# Patient Record
Sex: Male | Born: 1950 | Race: White | Hispanic: No | Smoking: Never smoker
Health system: Southern US, Community
[De-identification: ages and names within clinical notes are randomized; demographics above are authoritative.]

## PROBLEM LIST (undated history)

## (undated) DIAGNOSIS — E1151 Type 2 diabetes mellitus with diabetic peripheral angiopathy without gangrene: Secondary | ICD-10-CM

## (undated) DIAGNOSIS — Z860100 Personal history of colon polyps, unspecified: Secondary | ICD-10-CM

## (undated) DIAGNOSIS — F3340 Major depressive disorder, recurrent, in remission, unspecified: Secondary | ICD-10-CM

## (undated) DIAGNOSIS — E1142 Type 2 diabetes mellitus with diabetic polyneuropathy: Secondary | ICD-10-CM

## (undated) DIAGNOSIS — N200 Calculus of kidney: Secondary | ICD-10-CM

## (undated) DIAGNOSIS — I739 Peripheral vascular disease, unspecified: Secondary | ICD-10-CM

## (undated) DIAGNOSIS — I82402 Acute embolism and thrombosis of unspecified deep veins of left lower extremity: Secondary | ICD-10-CM

## (undated) DIAGNOSIS — E7800 Pure hypercholesterolemia, unspecified: Secondary | ICD-10-CM

## (undated) DIAGNOSIS — H269 Unspecified cataract: Secondary | ICD-10-CM

## (undated) DIAGNOSIS — Z973 Presence of spectacles and contact lenses: Secondary | ICD-10-CM

## (undated) DIAGNOSIS — IMO0002 Reserved for concepts with insufficient information to code with codable children: Secondary | ICD-10-CM

## (undated) DIAGNOSIS — D649 Anemia, unspecified: Secondary | ICD-10-CM

## (undated) DIAGNOSIS — M1990 Unspecified osteoarthritis, unspecified site: Secondary | ICD-10-CM

## (undated) DIAGNOSIS — I10 Essential (primary) hypertension: Secondary | ICD-10-CM

## (undated) DIAGNOSIS — I509 Heart failure, unspecified: Secondary | ICD-10-CM

## (undated) DIAGNOSIS — IMO0001 Reserved for inherently not codable concepts without codable children: Secondary | ICD-10-CM

## (undated) DIAGNOSIS — R609 Edema, unspecified: Secondary | ICD-10-CM

## (undated) DIAGNOSIS — E1165 Type 2 diabetes mellitus with hyperglycemia: Principal | ICD-10-CM

## (undated) DIAGNOSIS — R55 Syncope and collapse: Secondary | ICD-10-CM

## (undated) DIAGNOSIS — I2510 Atherosclerotic heart disease of native coronary artery without angina pectoris: Secondary | ICD-10-CM

## (undated) DIAGNOSIS — J45909 Unspecified asthma, uncomplicated: Secondary | ICD-10-CM

## (undated) HISTORY — PX: EYE SURGERY: SHX253

## (undated) HISTORY — PX: PERCUTANEOUS STENT INTERVENTION: SHX6019

## (undated) HISTORY — DX: Reserved for concepts with insufficient information to code with codable children: IMO0002

## (undated) HISTORY — PX: COLONOSCOPY: SHX174

## (undated) HISTORY — DX: Major depressive disorder, recurrent, in remission, unspecified: F33.40

## (undated) HISTORY — DX: Heart failure, unspecified: I50.9

## (undated) HISTORY — DX: Essential (primary) hypertension: I10

## (undated) HISTORY — DX: Peripheral vascular disease, unspecified: I73.9

## (undated) HISTORY — DX: Type 2 diabetes mellitus with diabetic polyneuropathy: E11.42

## (undated) HISTORY — DX: Pure hypercholesterolemia, unspecified: E78.00

## (undated) HISTORY — PX: TOE AMPUTATION: SHX809

## (undated) HISTORY — DX: Type 2 diabetes mellitus with hyperglycemia: E11.65

## (undated) HISTORY — DX: Acute embolism and thrombosis of unspecified deep veins of left lower extremity: I82.402

## (undated) HISTORY — PX: CARDIAC CATHETERIZATION: SHX172

## (undated) HISTORY — DX: Atherosclerotic heart disease of native coronary artery without angina pectoris: I25.10

## (undated) HISTORY — DX: Unspecified cataract: H26.9

## (undated) HISTORY — DX: Type 2 diabetes mellitus with diabetic peripheral angiopathy without gangrene: E11.51

## (undated) HISTORY — DX: Unspecified osteoarthritis, unspecified site: M19.90

## (undated) HISTORY — DX: Calculus of kidney: N20.0

---

## 2000-12-26 ENCOUNTER — Emergency Department (HOSPITAL_COMMUNITY): Admission: EM | Admit: 2000-12-26 | Discharge: 2000-12-26 | Payer: Self-pay | Admitting: Emergency Medicine

## 2004-08-30 ENCOUNTER — Ambulatory Visit: Payer: Self-pay

## 2005-09-04 ENCOUNTER — Inpatient Hospital Stay (HOSPITAL_COMMUNITY): Admission: EM | Admit: 2005-09-04 | Discharge: 2005-09-10 | Payer: Self-pay | Admitting: Emergency Medicine

## 2005-09-05 ENCOUNTER — Encounter: Payer: Self-pay | Admitting: Vascular Surgery

## 2005-09-05 ENCOUNTER — Encounter (INDEPENDENT_AMBULATORY_CARE_PROVIDER_SITE_OTHER): Payer: Self-pay | Admitting: Cardiology

## 2005-09-05 DIAGNOSIS — I2699 Other pulmonary embolism without acute cor pulmonale: Secondary | ICD-10-CM

## 2005-09-05 HISTORY — DX: Other pulmonary embolism without acute cor pulmonale: I26.99

## 2005-09-10 ENCOUNTER — Inpatient Hospital Stay (HOSPITAL_COMMUNITY): Admission: AD | Admit: 2005-09-10 | Discharge: 2005-09-13 | Payer: Self-pay | Admitting: Psychiatry

## 2005-09-10 ENCOUNTER — Ambulatory Visit: Payer: Self-pay | Admitting: Psychiatry

## 2005-09-14 ENCOUNTER — Other Ambulatory Visit (HOSPITAL_COMMUNITY): Admission: RE | Admit: 2005-09-14 | Discharge: 2005-12-13 | Payer: Self-pay | Admitting: Psychiatry

## 2006-01-30 HISTORY — PX: CORONARY ARTERY BYPASS GRAFT: SHX141

## 2006-03-13 ENCOUNTER — Ambulatory Visit: Payer: Self-pay | Admitting: Cardiovascular Disease

## 2006-03-15 ENCOUNTER — Ambulatory Visit (HOSPITAL_COMMUNITY): Admission: RE | Admit: 2006-03-15 | Discharge: 2006-03-15 | Payer: Self-pay | Admitting: *Deleted

## 2006-03-23 ENCOUNTER — Inpatient Hospital Stay (HOSPITAL_COMMUNITY): Admission: AD | Admit: 2006-03-23 | Discharge: 2006-04-06 | Payer: Self-pay | Admitting: *Deleted

## 2006-03-26 ENCOUNTER — Ambulatory Visit: Payer: Self-pay | Admitting: Cardiothoracic Surgery

## 2006-03-27 ENCOUNTER — Encounter (INDEPENDENT_AMBULATORY_CARE_PROVIDER_SITE_OTHER): Payer: Self-pay | Admitting: Cardiology

## 2006-03-27 ENCOUNTER — Encounter: Payer: Self-pay | Admitting: Vascular Surgery

## 2006-04-26 ENCOUNTER — Encounter (HOSPITAL_COMMUNITY): Admission: RE | Admit: 2006-04-26 | Discharge: 2006-07-25 | Payer: Self-pay | Admitting: *Deleted

## 2006-04-27 ENCOUNTER — Ambulatory Visit: Payer: Self-pay | Admitting: Cardiothoracic Surgery

## 2006-05-02 ENCOUNTER — Encounter: Admission: RE | Admit: 2006-05-02 | Discharge: 2006-07-31 | Payer: Self-pay | Admitting: *Deleted

## 2006-07-26 ENCOUNTER — Encounter (HOSPITAL_COMMUNITY): Admission: RE | Admit: 2006-07-26 | Discharge: 2006-08-30 | Payer: Self-pay | Admitting: *Deleted

## 2006-08-09 ENCOUNTER — Other Ambulatory Visit (HOSPITAL_COMMUNITY): Admission: RE | Admit: 2006-08-09 | Discharge: 2006-08-28 | Payer: Self-pay | Admitting: Psychiatry

## 2006-08-10 ENCOUNTER — Ambulatory Visit: Payer: Self-pay | Admitting: Psychiatry

## 2006-09-20 ENCOUNTER — Ambulatory Visit: Payer: Self-pay | Admitting: Internal Medicine

## 2006-09-20 ENCOUNTER — Encounter (INDEPENDENT_AMBULATORY_CARE_PROVIDER_SITE_OTHER): Payer: Self-pay | Admitting: Nurse Practitioner

## 2006-09-20 LAB — CONVERTED CEMR LAB: Glucose, Bld: 166 mg/dL

## 2006-09-21 ENCOUNTER — Encounter (INDEPENDENT_AMBULATORY_CARE_PROVIDER_SITE_OTHER): Payer: Self-pay | Admitting: Nurse Practitioner

## 2006-09-21 ENCOUNTER — Ambulatory Visit: Payer: Self-pay | Admitting: *Deleted

## 2006-09-21 LAB — CONVERTED CEMR LAB
ALT: 29 units/L (ref 0–53)
AST: 16 units/L (ref 0–37)
Alkaline Phosphatase: 59 units/L (ref 39–117)
Basophils Relative: 1 % (ref 0–1)
Calcium: 9.3 mg/dL (ref 8.4–10.5)
Chloride: 104 meq/L (ref 96–112)
Eosinophils Relative: 2 % (ref 0–5)
Glucose, Bld: 189 mg/dL — ABNORMAL HIGH (ref 70–99)
HCT: 37 % — ABNORMAL LOW (ref 39.0–52.0)
Hemoglobin: 11.2 g/dL — ABNORMAL LOW (ref 13.0–17.0)
INR: 4.5 — ABNORMAL HIGH (ref 0.0–1.5)
Monocytes Relative: 9 % (ref 3–11)
Potassium: 4.5 meq/L (ref 3.5–5.3)
Prothrombin Time: 44.5 s — ABNORMAL HIGH (ref 11.6–15.2)
RBC: 4.46 M/uL (ref 4.22–5.81)
RDW: 15.6 % — ABNORMAL HIGH (ref 11.5–14.0)
Total Bilirubin: 0.5 mg/dL (ref 0.3–1.2)
Total Protein: 6.9 g/dL (ref 6.0–8.3)
WBC: 5.9 10*3/uL (ref 4.0–10.5)

## 2006-09-25 ENCOUNTER — Encounter: Payer: Self-pay | Admitting: Nurse Practitioner

## 2006-09-25 DIAGNOSIS — Z86718 Personal history of other venous thrombosis and embolism: Secondary | ICD-10-CM | POA: Insufficient documentation

## 2006-09-25 DIAGNOSIS — I10 Essential (primary) hypertension: Secondary | ICD-10-CM | POA: Insufficient documentation

## 2006-10-08 ENCOUNTER — Telehealth (INDEPENDENT_AMBULATORY_CARE_PROVIDER_SITE_OTHER): Payer: Self-pay | Admitting: *Deleted

## 2006-10-11 ENCOUNTER — Ambulatory Visit: Payer: Self-pay | Admitting: Family Medicine

## 2006-10-11 DIAGNOSIS — M940 Chondrocostal junction syndrome [Tietze]: Secondary | ICD-10-CM | POA: Insufficient documentation

## 2006-10-11 LAB — CONVERTED CEMR LAB: Blood Glucose, Fingerstick: 192

## 2006-10-12 ENCOUNTER — Encounter (INDEPENDENT_AMBULATORY_CARE_PROVIDER_SITE_OTHER): Payer: Self-pay | Admitting: Nurse Practitioner

## 2006-10-18 ENCOUNTER — Telehealth (INDEPENDENT_AMBULATORY_CARE_PROVIDER_SITE_OTHER): Payer: Self-pay | Admitting: Internal Medicine

## 2006-10-18 ENCOUNTER — Telehealth (INDEPENDENT_AMBULATORY_CARE_PROVIDER_SITE_OTHER): Payer: Self-pay | Admitting: *Deleted

## 2006-10-19 ENCOUNTER — Ambulatory Visit: Payer: Self-pay | Admitting: Nurse Practitioner

## 2006-10-19 DIAGNOSIS — L84 Corns and callosities: Secondary | ICD-10-CM | POA: Insufficient documentation

## 2006-10-19 LAB — CONVERTED CEMR LAB
Basophils Absolute: 0.1 10*3/uL (ref 0.0–0.1)
Basophils Relative: 1 % (ref 0–1)
HCT: 37.8 % — ABNORMAL LOW (ref 39.0–52.0)
Hemoglobin: 11.4 g/dL — ABNORMAL LOW (ref 13.0–17.0)
Lymphocytes Relative: 17 % (ref 12–46)
Monocytes Relative: 10 % (ref 3–11)
Neutro Abs: 7.6 10*3/uL (ref 1.7–7.7)
RBC: 4.46 M/uL (ref 4.22–5.81)
RDW: 15.2 % — ABNORMAL HIGH (ref 11.5–14.0)
WBC: 10.5 10*3/uL (ref 4.0–10.5)

## 2006-10-22 ENCOUNTER — Telehealth (INDEPENDENT_AMBULATORY_CARE_PROVIDER_SITE_OTHER): Payer: Self-pay | Admitting: *Deleted

## 2006-10-24 ENCOUNTER — Ambulatory Visit: Payer: Self-pay | Admitting: Nurse Practitioner

## 2006-10-24 ENCOUNTER — Ambulatory Visit (HOSPITAL_COMMUNITY): Admission: RE | Admit: 2006-10-24 | Discharge: 2006-10-24 | Payer: Self-pay | Admitting: Nurse Practitioner

## 2006-10-25 ENCOUNTER — Telehealth (INDEPENDENT_AMBULATORY_CARE_PROVIDER_SITE_OTHER): Payer: Self-pay | Admitting: *Deleted

## 2006-11-06 ENCOUNTER — Telehealth (INDEPENDENT_AMBULATORY_CARE_PROVIDER_SITE_OTHER): Payer: Self-pay | Admitting: *Deleted

## 2006-11-08 ENCOUNTER — Ambulatory Visit: Payer: Self-pay | Admitting: Nurse Practitioner

## 2006-11-19 ENCOUNTER — Telehealth (INDEPENDENT_AMBULATORY_CARE_PROVIDER_SITE_OTHER): Payer: Self-pay | Admitting: *Deleted

## 2006-11-22 ENCOUNTER — Ambulatory Visit: Payer: Self-pay | Admitting: Rheumatology

## 2006-11-22 ENCOUNTER — Encounter (INDEPENDENT_AMBULATORY_CARE_PROVIDER_SITE_OTHER): Payer: Self-pay | Admitting: Nurse Practitioner

## 2006-11-22 ENCOUNTER — Emergency Department (HOSPITAL_COMMUNITY): Admission: EM | Admit: 2006-11-22 | Discharge: 2006-11-22 | Payer: Self-pay | Admitting: Emergency Medicine

## 2006-11-22 LAB — CONVERTED CEMR LAB
Blood Glucose, Fingerstick: 195
Prothrombin Time: 22 s — ABNORMAL HIGH (ref 11.6–15.2)

## 2006-11-23 ENCOUNTER — Encounter (INDEPENDENT_AMBULATORY_CARE_PROVIDER_SITE_OTHER): Payer: Self-pay | Admitting: Nurse Practitioner

## 2006-11-23 ENCOUNTER — Telehealth (INDEPENDENT_AMBULATORY_CARE_PROVIDER_SITE_OTHER): Payer: Self-pay | Admitting: Nurse Practitioner

## 2006-11-28 ENCOUNTER — Encounter (HOSPITAL_BASED_OUTPATIENT_CLINIC_OR_DEPARTMENT_OTHER): Admission: RE | Admit: 2006-11-28 | Discharge: 2007-02-26 | Payer: Self-pay | Admitting: Surgery

## 2006-11-29 ENCOUNTER — Encounter (INDEPENDENT_AMBULATORY_CARE_PROVIDER_SITE_OTHER): Payer: Self-pay | Admitting: Nurse Practitioner

## 2006-11-29 ENCOUNTER — Ambulatory Visit (HOSPITAL_COMMUNITY): Admission: RE | Admit: 2006-11-29 | Discharge: 2006-11-29 | Payer: Self-pay | Admitting: Surgery

## 2006-11-29 DIAGNOSIS — E1169 Type 2 diabetes mellitus with other specified complication: Secondary | ICD-10-CM | POA: Insufficient documentation

## 2006-11-30 ENCOUNTER — Encounter (INDEPENDENT_AMBULATORY_CARE_PROVIDER_SITE_OTHER): Payer: Self-pay | Admitting: Nurse Practitioner

## 2006-11-30 ENCOUNTER — Ambulatory Visit (HOSPITAL_COMMUNITY): Admission: RE | Admit: 2006-11-30 | Discharge: 2006-11-30 | Payer: Self-pay | Admitting: Surgery

## 2006-12-18 ENCOUNTER — Ambulatory Visit: Payer: Self-pay | Admitting: *Deleted

## 2006-12-24 ENCOUNTER — Encounter (INDEPENDENT_AMBULATORY_CARE_PROVIDER_SITE_OTHER): Payer: Self-pay | Admitting: Nurse Practitioner

## 2006-12-31 DIAGNOSIS — IMO0002 Reserved for concepts with insufficient information to code with codable children: Secondary | ICD-10-CM

## 2006-12-31 DIAGNOSIS — S98132A Complete traumatic amputation of one left lesser toe, initial encounter: Secondary | ICD-10-CM | POA: Insufficient documentation

## 2007-01-02 ENCOUNTER — Observation Stay (HOSPITAL_COMMUNITY): Admission: RE | Admit: 2007-01-02 | Discharge: 2007-01-03 | Payer: Self-pay | Admitting: Orthopedic Surgery

## 2007-01-02 ENCOUNTER — Encounter (INDEPENDENT_AMBULATORY_CARE_PROVIDER_SITE_OTHER): Payer: Self-pay | Admitting: Orthopedic Surgery

## 2007-02-08 ENCOUNTER — Encounter (INDEPENDENT_AMBULATORY_CARE_PROVIDER_SITE_OTHER): Payer: Self-pay | Admitting: Nurse Practitioner

## 2007-02-08 LAB — CONVERTED CEMR LAB
INR: 4.6
aPTT: 43.6 s

## 2007-02-15 ENCOUNTER — Encounter (INDEPENDENT_AMBULATORY_CARE_PROVIDER_SITE_OTHER): Payer: Self-pay | Admitting: Nurse Practitioner

## 2007-03-27 ENCOUNTER — Ambulatory Visit: Payer: Self-pay | Admitting: Nurse Practitioner

## 2007-03-27 LAB — CONVERTED CEMR LAB
Blood Glucose, Fingerstick: 206
Hgb A1c MFr Bld: 7.1 %

## 2007-04-16 ENCOUNTER — Encounter (INDEPENDENT_AMBULATORY_CARE_PROVIDER_SITE_OTHER): Payer: Self-pay | Admitting: Nurse Practitioner

## 2007-05-30 ENCOUNTER — Ambulatory Visit: Payer: Self-pay | Admitting: Nurse Practitioner

## 2007-05-30 DIAGNOSIS — J309 Allergic rhinitis, unspecified: Secondary | ICD-10-CM | POA: Insufficient documentation

## 2007-10-10 ENCOUNTER — Ambulatory Visit: Payer: Self-pay | Admitting: Nurse Practitioner

## 2007-10-10 LAB — CONVERTED CEMR LAB: Blood Glucose, Fingerstick: 202

## 2007-12-24 ENCOUNTER — Encounter (INDEPENDENT_AMBULATORY_CARE_PROVIDER_SITE_OTHER): Payer: Self-pay | Admitting: Nurse Practitioner

## 2008-01-22 ENCOUNTER — Ambulatory Visit: Payer: Self-pay | Admitting: Nurse Practitioner

## 2008-01-27 LAB — CONVERTED CEMR LAB
AST: 24 units/L (ref 0–37)
Alkaline Phosphatase: 44 units/L (ref 39–117)
Basophils Absolute: 0 10*3/uL (ref 0.0–0.1)
Basophils Relative: 0 % (ref 0–1)
CO2: 21 meq/L (ref 19–32)
Calcium: 9.5 mg/dL (ref 8.4–10.5)
Chloride: 103 meq/L (ref 96–112)
Creatinine, Ser: 1.46 mg/dL (ref 0.40–1.50)
HCT: 33.4 % — ABNORMAL LOW (ref 39.0–52.0)
Hemoglobin: 10.6 g/dL — ABNORMAL LOW (ref 13.0–17.0)
MCV: 86.5 fL (ref 78.0–100.0)
Neutro Abs: 5.4 10*3/uL (ref 1.7–7.7)
Platelets: 316 10*3/uL (ref 150–400)
Potassium: 5.4 meq/L — ABNORMAL HIGH (ref 3.5–5.3)
RBC: 3.86 M/uL — ABNORMAL LOW (ref 4.22–5.81)
RDW: 14.4 % (ref 11.5–15.5)
Total Protein: 7.3 g/dL (ref 6.0–8.3)

## 2008-01-28 ENCOUNTER — Ambulatory Visit: Payer: Self-pay | Admitting: Nurse Practitioner

## 2008-01-28 ENCOUNTER — Telehealth (INDEPENDENT_AMBULATORY_CARE_PROVIDER_SITE_OTHER): Payer: Self-pay | Admitting: Nurse Practitioner

## 2008-01-30 ENCOUNTER — Ambulatory Visit: Payer: Self-pay | Admitting: Nurse Practitioner

## 2008-01-30 DIAGNOSIS — N183 Chronic kidney disease, stage 3 unspecified: Secondary | ICD-10-CM | POA: Insufficient documentation

## 2008-01-30 DIAGNOSIS — E1122 Type 2 diabetes mellitus with diabetic chronic kidney disease: Secondary | ICD-10-CM | POA: Insufficient documentation

## 2008-01-30 LAB — CONVERTED CEMR LAB: Blood Glucose, Fingerstick: 119

## 2008-01-31 ENCOUNTER — Encounter (INDEPENDENT_AMBULATORY_CARE_PROVIDER_SITE_OTHER): Payer: Self-pay | Admitting: Nurse Practitioner

## 2008-02-03 ENCOUNTER — Telehealth (INDEPENDENT_AMBULATORY_CARE_PROVIDER_SITE_OTHER): Payer: Self-pay | Admitting: Nurse Practitioner

## 2008-02-11 ENCOUNTER — Telehealth (INDEPENDENT_AMBULATORY_CARE_PROVIDER_SITE_OTHER): Payer: Self-pay | Admitting: Nurse Practitioner

## 2008-02-19 ENCOUNTER — Encounter (INDEPENDENT_AMBULATORY_CARE_PROVIDER_SITE_OTHER): Payer: Self-pay | Admitting: Nurse Practitioner

## 2008-02-19 DIAGNOSIS — H5310 Unspecified subjective visual disturbances: Secondary | ICD-10-CM | POA: Insufficient documentation

## 2008-03-26 ENCOUNTER — Ambulatory Visit: Payer: Self-pay | Admitting: Nurse Practitioner

## 2008-03-26 LAB — CONVERTED CEMR LAB
Blood Glucose, Fingerstick: 157
Hgb A1c MFr Bld: 7.8 %

## 2008-04-06 ENCOUNTER — Telehealth (INDEPENDENT_AMBULATORY_CARE_PROVIDER_SITE_OTHER): Payer: Self-pay | Admitting: Nurse Practitioner

## 2008-04-13 ENCOUNTER — Telehealth (INDEPENDENT_AMBULATORY_CARE_PROVIDER_SITE_OTHER): Payer: Self-pay | Admitting: Nurse Practitioner

## 2008-04-17 ENCOUNTER — Telehealth (INDEPENDENT_AMBULATORY_CARE_PROVIDER_SITE_OTHER): Payer: Self-pay | Admitting: Nurse Practitioner

## 2008-05-25 ENCOUNTER — Ambulatory Visit: Payer: Self-pay | Admitting: Nurse Practitioner

## 2008-05-25 LAB — CONVERTED CEMR LAB
Glucose, Urine, Semiquant: NEGATIVE
Hgb A1c MFr Bld: 8.1 %
Nitrite: NEGATIVE
pH: 5.5

## 2008-05-29 IMAGING — CT CT HEART WO/W CTA ONLY W/ CA
1 of 4 series · 6 of 20 positions shown, 8 images · non-contrast
Comparison: none

CLINICAL DATA: Mr. Ab Malik is a 55-year-old patient of Dr. Mukul and Dr. Foekens.  He has a history of chest pain and shortness of breath.  Cardiac CTA was done to rule out coronary artery disease.  The patient also has a history of pulmonary emboli. 
CARDIAC CTA WITH CALCIUM SCORE ? 03/15/06:
PROTOCOL: The patient was scanned on a Siemens Sensation 64-slice scanner.  Gantry rotation speed was 330 milliseconds.  The patient was beta-blocked, and sublingual nitroglycerin was used.  After an AP topogram 3-mm noncontrast calcium scoring was performed.  The patient then had 20-cc timing bolus done with the region of interest in the ascending aorta.  80 cc of contrast were then used for coronary CTA.
The images were sent to a [HOSPITAL] workstation and reconstructed in the VRT, MIP, and MPR modes.  Gaited ejection fraction was calculated using phases 0-90% at 10% intervals.  Lung, bone, and soft tissue windows were also examined for noncardiac findings. 
NONCARDIAC FINDINGS:  Lung windows demonstrate minimal right lower lobe opacity, either atelectasis or infarct.  Soft tissue windows demonstrate subtle heterogeneous enhancement within left upper lobe and right lower lobe pulmonary artery branches.  Although the study was not protocolled to evaluate the pulmonary arteries, residual, decreased embolic burden is suspected.  No pleural effusion.  No osseous findings.
 CORONARY CTA:  Coronary CTA was somewhat difficult to determine.  The patient's calcium score was [DATE].  There was dense calcification in the proximal LAD.  This put him in the greater than 75th percentile for age-sex match controls.
The left main coronary itself had no critical stenoses.  There was a less than 30% calcific plaque at the distal end of the left main.  
The left anterior descending artery had a possible greater than 50% stenosis, which was extensively calcified in the proximal portion.  Mid and distal LAD had less than 50% mixed plaque.  
There was a very small first diagonal branch.  The second diagonal branch was much larger and without critical disease. 
Circumflex coronary artery appeared to be left dominant.  There was less than 30% mixed plaque in the proximal and mid circumflex coronary artery with some areas of dense calcification.  There was a large first obtuse marginal branch without significant disease.  The distal PDA and posterolateral branches were not well seen but did not appear to have critical disease. 
The right coronary artery was small and nondominant.  It had less than 50% calcific lesions in the proximal and mid portions.
The patient's wall motion appeared normal.  The quantitative ejection fraction was 62%.  There was no abnormal-shaped septum and no evidence of cor pulmonale with normal right-sided cardiac chambers.  There was no ASD or VSD.  The aortic valve was trileaflet.  Pulmonary veins drained into the back of the left atrium normally, and there was no pericardial effusion.

[Series 7: soft tissue · axial · 0.65mm/px · z∈[-278,-194]mm · 6 of 40 slices shown, 8 images]
[im 6/40  vessel]
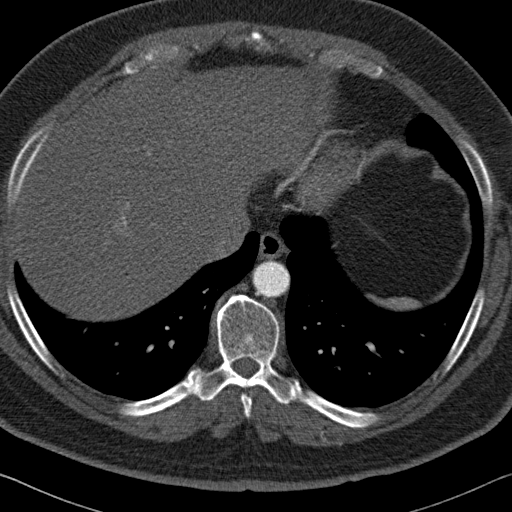
[im 6/40  lung]
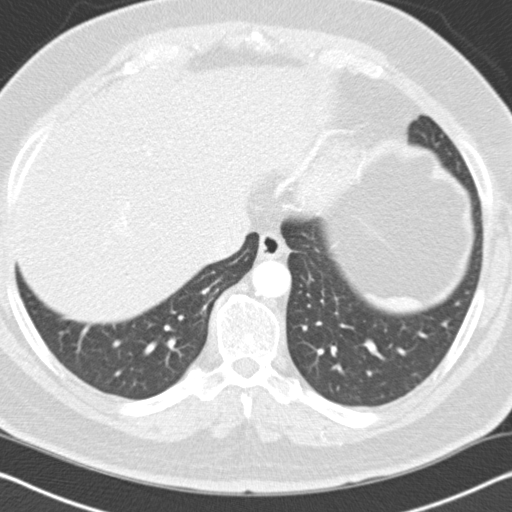
[im 12/40  vessel]
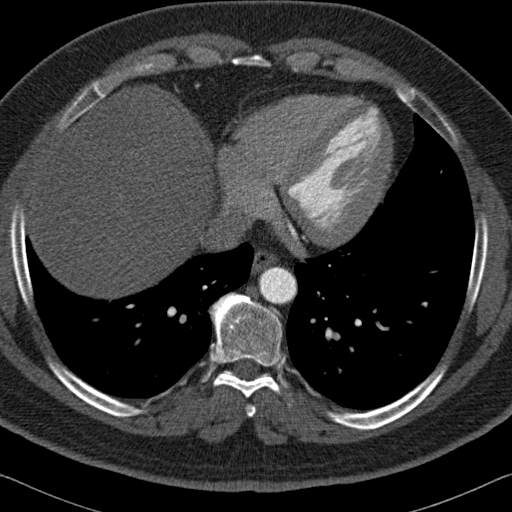
[im 17/40  vessel]
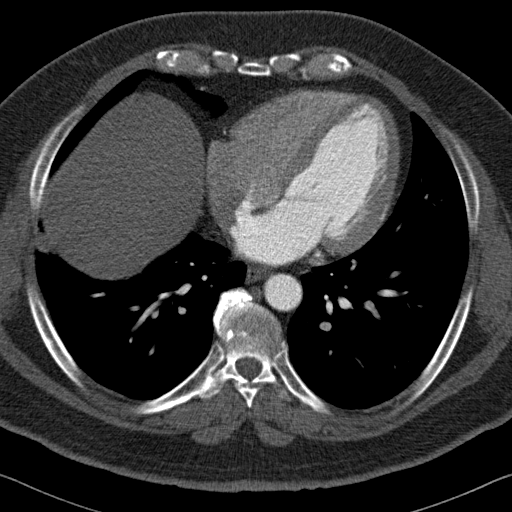
[im 23/40  vessel]
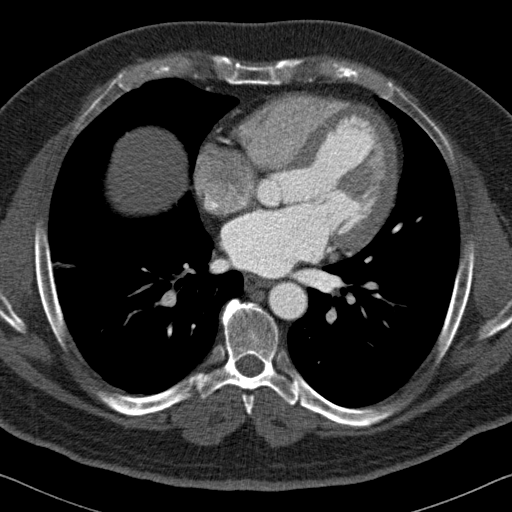
[im 28/40  vessel]
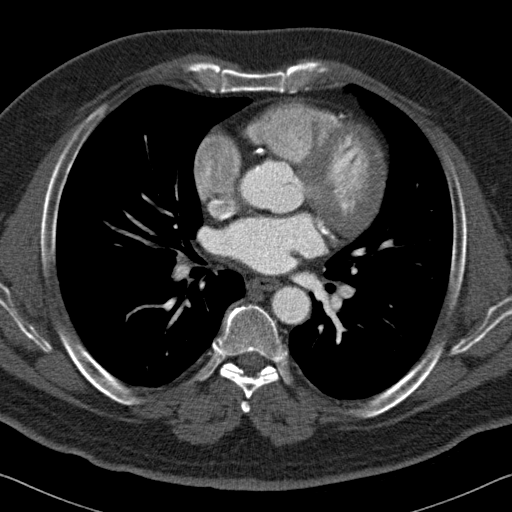
[im 28/40  lung]
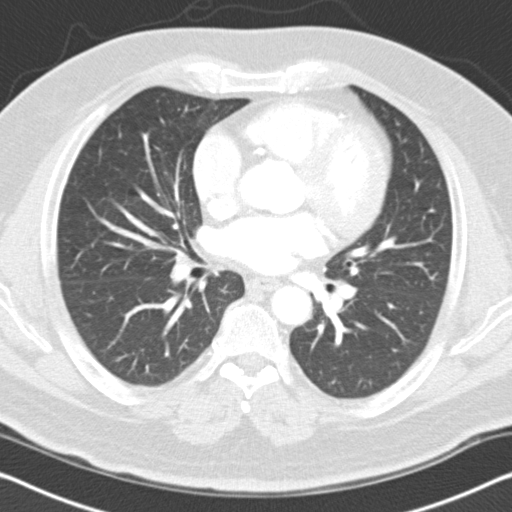
[im 34/40  vessel]
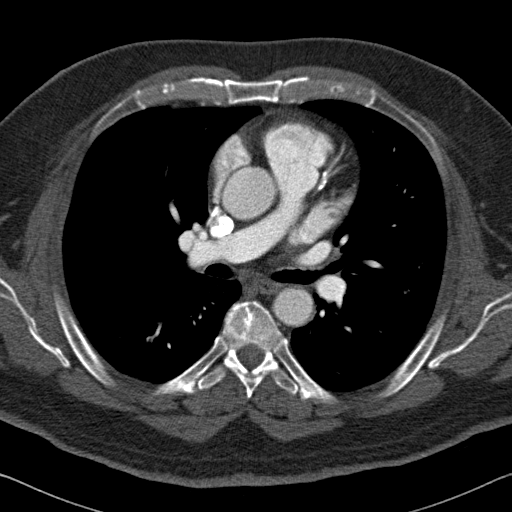

[6 of 20 positions shown; findings below may reference images not displayed]

IMPRESSION: 1.  Coronary CTA with possible greater than 50% calcific lesion in the proximal LAD. 
2.  Left dominant coronary circulation. 
3.  Normal LV function.  Ejection fraction of 62%.
4.  Right-sided cardiac chambers with no evidence of cor pulmonale. 
5.  Suspect residual, but improved, bilateral pulmonary emboli. 
6.  Agatston score of 8988, placing the patient in the greater than 75th percentile for age-sex match control. 
The study was done in conjunction with Dr. Erickson Nye and Dr. Sergiu Zacon Lengerie, to be billed by [facility] as 148/7303 T-codes.

## 2008-06-03 ENCOUNTER — Telehealth (INDEPENDENT_AMBULATORY_CARE_PROVIDER_SITE_OTHER): Payer: Self-pay | Admitting: Nurse Practitioner

## 2009-01-11 ENCOUNTER — Encounter: Payer: Self-pay | Admitting: Cardiovascular Disease

## 2009-05-14 ENCOUNTER — Encounter: Payer: Self-pay | Admitting: Cardiovascular Disease

## 2009-05-24 ENCOUNTER — Encounter: Payer: Self-pay | Admitting: Cardiovascular Disease

## 2009-06-10 ENCOUNTER — Encounter: Payer: Self-pay | Admitting: Cardiovascular Disease

## 2009-06-10 ENCOUNTER — Ambulatory Visit: Payer: Self-pay | Admitting: Cardiovascular Disease

## 2009-06-10 DIAGNOSIS — I25810 Atherosclerosis of coronary artery bypass graft(s) without angina pectoris: Secondary | ICD-10-CM

## 2009-06-10 DIAGNOSIS — I25119 Atherosclerotic heart disease of native coronary artery with unspecified angina pectoris: Secondary | ICD-10-CM | POA: Insufficient documentation

## 2009-06-10 DIAGNOSIS — E785 Hyperlipidemia, unspecified: Secondary | ICD-10-CM | POA: Insufficient documentation

## 2009-06-11 LAB — CONVERTED CEMR LAB
Albumin: 4.1 g/dL (ref 3.5–5.2)
Alkaline Phosphatase: 67 units/L (ref 39–117)
HDL: 34 mg/dL — ABNORMAL LOW (ref >39)
LDL Cholesterol: 46 mg/dL (ref 0–99)
Total CHOL/HDL Ratio: 3.8
Total Protein: 6.9 g/dL (ref 6.0–8.3)
Triglycerides: 242 mg/dL — ABNORMAL HIGH (ref <150)

## 2009-06-14 ENCOUNTER — Encounter: Payer: Self-pay | Admitting: Cardiovascular Disease

## 2009-07-08 ENCOUNTER — Ambulatory Visit: Payer: Self-pay | Admitting: Cardiovascular Disease

## 2009-07-08 LAB — CONVERTED CEMR LAB: POC INR: 2.2

## 2009-07-15 ENCOUNTER — Telehealth: Payer: Self-pay | Admitting: Cardiovascular Disease

## 2009-08-04 ENCOUNTER — Telehealth: Payer: Self-pay | Admitting: Cardiovascular Disease

## 2009-08-05 ENCOUNTER — Ambulatory Visit: Payer: Self-pay | Admitting: Cardiovascular Disease

## 2009-08-05 DIAGNOSIS — R609 Edema, unspecified: Secondary | ICD-10-CM | POA: Insufficient documentation

## 2009-08-06 LAB — CONVERTED CEMR LAB
BUN: 31 mg/dL — ABNORMAL HIGH (ref 6–23)
Calcium: 9.2 mg/dL (ref 8.4–10.5)
Glucose, Bld: 307 mg/dL — ABNORMAL HIGH (ref 70–99)
Sodium: 134 meq/L — ABNORMAL LOW (ref 135–145)

## 2009-08-26 ENCOUNTER — Encounter: Payer: Self-pay | Admitting: Cardiovascular Disease

## 2009-09-01 ENCOUNTER — Ambulatory Visit: Payer: Self-pay | Admitting: Cardiovascular Disease

## 2009-09-01 ENCOUNTER — Telehealth: Payer: Self-pay | Admitting: Cardiovascular Disease

## 2009-09-01 LAB — CONVERTED CEMR LAB: POC INR: 3.4

## 2009-09-22 ENCOUNTER — Ambulatory Visit: Payer: Self-pay | Admitting: Cardiovascular Disease

## 2009-09-22 LAB — CONVERTED CEMR LAB: POC INR: 3.6

## 2009-10-06 ENCOUNTER — Ambulatory Visit: Payer: Self-pay | Admitting: Cardiovascular Disease

## 2009-10-13 ENCOUNTER — Telehealth: Payer: Self-pay | Admitting: Cardiovascular Disease

## 2009-10-27 ENCOUNTER — Ambulatory Visit: Payer: Self-pay | Admitting: Cardiology

## 2009-10-27 LAB — CONVERTED CEMR LAB: POC INR: 3.3

## 2009-11-17 ENCOUNTER — Ambulatory Visit: Payer: Self-pay | Admitting: Internal Medicine

## 2009-12-14 ENCOUNTER — Encounter: Payer: Self-pay | Admitting: Cardiovascular Disease

## 2009-12-14 ENCOUNTER — Ambulatory Visit: Payer: Self-pay | Admitting: Cardiovascular Disease

## 2009-12-14 DIAGNOSIS — R0602 Shortness of breath: Secondary | ICD-10-CM | POA: Insufficient documentation

## 2009-12-29 ENCOUNTER — Ambulatory Visit: Payer: Self-pay | Admitting: Cardiovascular Disease

## 2010-01-12 ENCOUNTER — Ambulatory Visit: Payer: Self-pay | Admitting: Cardiovascular Disease

## 2010-01-12 LAB — CONVERTED CEMR LAB: POC INR: 1.8

## 2010-01-26 ENCOUNTER — Ambulatory Visit: Payer: Self-pay

## 2010-02-03 ENCOUNTER — Ambulatory Visit
Admission: RE | Admit: 2010-02-03 | Discharge: 2010-02-03 | Payer: Self-pay | Source: Home / Self Care | Attending: Cardiovascular Disease | Admitting: Cardiovascular Disease

## 2010-02-03 LAB — CONVERTED CEMR LAB: POC INR: 2

## 2010-02-20 ENCOUNTER — Encounter: Payer: Self-pay | Admitting: Cardiothoracic Surgery

## 2010-02-23 ENCOUNTER — Ambulatory Visit: Admission: RE | Admit: 2010-02-23 | Discharge: 2010-02-23 | Payer: Self-pay | Source: Home / Self Care

## 2010-02-23 LAB — CONVERTED CEMR LAB: POC INR: 2.4

## 2010-03-01 NOTE — Assessment & Plan Note (Signed)
Summary: F6M/AMD   Visit Type:  Follow-up Referring Benjamin Davidson:  Benjamin Davidson Primary Benjamin Davidson:  Benjamin Sender, MD  CC:  c/o shortness of breath. Has occas. chest pain at rest not with activity..  History of Present Illness: Benjamin Davidson is a 60 year old gentleman with a history of coronary artery disease, bypass surgery March 2008, diabetes, obesity, history of DVT in the left leg, he is on chronic warfarin who presents for routine followup.  Benjamin Davidson states overall he is doing well. He does not exercise to any significant degree. He spends most of his free time watching TV and playing on the computer to late at night. He sleeps till late into the morning, typically till noon. He receives most of his medications at a discounted rate and states that they're not very expensive to him. Overall he has no new complaints.  he has not been taking Crestor as it caused muscle ache. He also has leg edema. He drinks a significant amount of water with Crystal light powder mixed in. He started going to the [employer] though goes irregularly.  last stress test July 2008 showing mild ischemia in the mid anteroseptal, apical anterior and apical septal region. Ejection fraction 40%. This test was done post bypass. No cardiac catheterization was done in followup as he had no symptoms.  EKG shows normal sinus rhythm with rate of 69 beats per minute, left bundle branch block.  Current Medications (verified): 1)  Zoloft 100 Mg  Tabs (Sertraline Hcl) .... Take 1 Tab By Mouth Daily For Mood 2)  Warfarin Sodium 1 Mg Tabs (Warfarin Sodium) .... Use As Directed By Anticoagualtion Clinic 3)  Bystolic 10 Mg  Tabs (Nebivolol Hcl) .... Take 1 Tablet By Mouth Once A Day 4)  Micardis Hct 80-12.5 Mg  Tabs (Telmisartan-Hctz) .Marland Kitchen.. 1 Tablet By Mouth Daily 5)  Aspirin 325 Mg  Tbec (Aspirin) .... Take 1 Tablet By Mouth Once A Day 6)  Loratadine 10 Mg  Tabs (Loratadine) .Marland Kitchen.. 1 Tablet By Mouth Daily As Needed 7)  Hydrochlorothiazide  25 Mg Tabs (Hydrochlorothiazide) .... Take One (1) By Mouth Daily 8)  Humulin 70/30 Pen 70-30 % Susp (Insulin Isophane & Regular) .... 35 Units Subcutaneously in The Morning and 35 Units Subcutaneously At Night 9)  Novofine 31 31g X 6 Mm Misc (Insulin Pen Needle) .... To Use With Insulin Pen Twice Daily 10)  Novofine 32g X 6 Mm Misc (Insulin Pen Needle) .... To Use With Inuslin Pen Twice Daily Dx 250.02 11)  Relion Blood Glucose Test  Strp (Glucose Blood) .... To Use With Glucometer To Check Blood Sugar Three Times Daily 12)  Warfarin Sodium 5 Mg Tabs (Warfarin Sodium) .... Use As Directed By Anticoagulation Clinic 13)  Furosemide 20 Mg Tabs (Furosemide) .... Take One Tablet By Mouth Daily As Needed 14)  Flax Seed Oil 1000 Mg Caps (Flaxseed (Linseed)) .... 2 By Mouth Two Times A Day 15)  Sm Green Tea Complex 1000 Mg Tabs (Green Tea (Camillia Sinensis)) .... 2 By Mouth Once Daily  Allergies (verified): 1)  ! 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  The patient denies fever, weight loss, weight gain, vision loss, decreased hearing, hoarseness, chest pain, syncope, prolonged cough, abdominal pain, incontinence, muscle weakness, depression, and enlarged lymph nodes.    Vital Signs:  Patient profile:   61 year old male Height:      68 inches Weight:      265 pounds BMI:     40.44 Pulse rate:   69 / minute BP  sitting:   112 / 68  (left arm) Cuff size:   large  Vitals Entered By: Bishop Dublin, CMA (December 14, 2009 3:32 PM)  Physical Exam  General:  middle-age gentleman in no apparent distress, HEENT exam is benign, oropharynx is clear, neck is supple with no JVP or carotid bruits, heart sounds are regular with S1-S2 and no murmurs appreciated, lungs are clear to auscultation with no wheezes Rales, abdominal exam is benign, 1 - 2+  lower extremity edema, neurologic exam is nonfocal, skin is warm and dry. Pulses are equal and symmetrical in his upper and lower extremities.   Impression & Recommendations:  Problem # 1:  EDEMA (ICD-782.3) etiology of his edema is likely secondary to diastolic dysfunction and dietary non-discretion. He does drink a significant amount in the daytime. We've asked him to increase his Lasix to b.i.d. until his edema resolves, decrease his fluid intake. Back and to call me if his edema gets worse or if his shortness of breath gets worse.  Problem # 2:  DYSPNEA (ICD-786.05) shortness of breath is likely multifactorial. He does have signs of fluid overload, is very deconditioned, is obese and very sedentary.  His updated medication list for this problem includes:    Bystolic 10 Mg Tabs (Nebivolol hcl) .Marland Kitchen... Take 1 tablet by mouth once a day    Micardis Hct 80-12.5 Mg Tabs (Telmisartan-hctz) .Marland Kitchen... 1 tablet by mouth daily    Aspirin 325 Mg Tbec (Aspirin) .Marland Kitchen... Take 1 tablet by mouth once a day    Hydrochlorothiazide 25 Mg Tabs (Hydrochlorothiazide) .Marland Kitchen... Take one (1) by mouth daily    Furosemide 20 Mg Tabs (Furosemide) .Marland Kitchen... Take one tablet by mouth daily. if increased swelling can take an extra 1 tablet daily as needed.  Problem # 3:  DVT (ICD-453.40) We'll continue him on warfarin. He is sedentary, please feel games all day and is a setup for additional DVTs  Problem # 4:  DIABETES MELLITUS, TYPE II, UNCONTROLLED (ICD-250.02) I've asked him to watch his diet,  continue to work out with a rash, try to lose weight in an effort to improve his diabetes.  His updated medication list for this problem includes:    Micardis Hct 80-12.5 Mg Tabs (Telmisartan-hctz) .Marland Kitchen... 1 tablet by mouth daily    Aspirin 325 Mg Tbec (Aspirin) .Marland Kitchen... Take 1 tablet by mouth once a day    Humulin 70/30 Pen 70-30 % Susp (Insulin isophane & regular) .Marland KitchenMarland KitchenMarland KitchenMarland Kitchen 35 units subcutaneously in the morning and 35 units subcutaneously at night  Problem # 5:  CAD, ARTERY BYPASS GRAFT (ICD-414.04) It is very important that we reestablish his lipid management. We will start him on samples of Vytorin 10/40 mg daily. He did not tolerate Crestor. He is uncertain if you will be able to afford Vytorin. If he is unable to afford this, we will try Lipitor when he goes generic or go back to simvastatin 40 mg daily.  His updated medication list for this problem includes:    Warfarin  Sodium 1 Mg Tabs (Warfarin sodium) ..... Use as directed by anticoagualtion clinic    Bystolic 10 Mg Tabs (Nebivolol hcl) .Marland Kitchen... Take 1 tablet by mouth once a day    Aspirin 325 Mg Tbec (Aspirin) .Marland Kitchen... Take 1 tablet by mouth once a day    Warfarin Sodium 5 Mg Tabs (Warfarin sodium) ..... Use as directed by anticoagulation clinic  Patient Instructions: 1)  Your physician recommends that you schedule a follow-up appointment in: 6 months. 2)  Your physician has recommended you make the following change in your medication: Start taking Vytorin 10/40mg  once daily. If you experience lower extremity edema (swelling) take an extra Lasix 20mg  tablet daily as needed.   3)  Decrease fluid intake. Prescriptions: FUROSEMIDE 20 MG TABS (FUROSEMIDE) Take one tablet by mouth daily. If increased swelling can take an extra 1 tablet daily as needed.  #60 x 6   Entered by:   Cloyde Reams RN   Authorized by:   Dossie Arbour MD   Signed by:   Cloyde Reams RN on 12/14/2009   Method used:   Electronically to        [facility] [address] (retail)        [address], [address]       [place], [place]  [address]       Ph: [phone]       Fax: [phone]   RxID:   [id number] VYTORIN 10-40 MG TABS (EZETIMIBE-SIMVASTATIN) Take one tablet by mouth daily at bedtime  #30 x 6   Entered by:   Cloyde Reams RN   Authorized by:   Dossie Arbour MD   Signed by:   Cloyde Reams RN on 12/14/2009   Method used:   Electronically to        [facility] [address] (retail)       [address], [address]       [place], [place]  [address]       Ph: [phone]       Fax: [phone]   RxID:   [id number]

## 2010-03-01 NOTE — Progress Notes (Signed)
Summary: Med Question  Phone Note Call from Patient Call back at Home Phone [phone]   Caller: Patient Call For: [facility] Reason for Call: Talk to Nurse, Talk to Doctor Details for Reason: Would like to know if should be taking potassium with his fluid pill HCTZ?  Summary of Call: Patient called and asked to speak with you about his medications.  Please call him at [phone] Initial call taken by: [place],  August 04, 2009 2:37 PM  Follow-up for Phone Call        If his PMD has not checked a BMP recently, we can do this to see what potassium is.  Can do in this office or [facility].     Appended Document: Med Question Draw BMP today to check potassium.

## 2010-03-01 NOTE — Progress Notes (Signed)
Summary: Refill HCTZ  Phone Note Call from Patient   Caller: Patient Call For: [facility] Summary of Call: Pt was seen today in [facility], pt states he needs a refill on his HCTZ 25mg  tablets.  Pt is on HCTZ 25mg  once daily, and on Micardis/HCT 80/12.5 once daily.  Pt wants to know if he should continue taking the HCTZ with the Micaris/HCT.  Please advise.  If pt is to continue rx refill for HCTZ 25mg  needs to be sent to [facility] [address]  Please call pt and advise. Thanks Initial call taken by: Cloyde Reams RN,  September 01, 2009 4:23 PM  Follow-up for Phone Call        Could change micardis HCT to losartan 100 mg daily.      Appended Document: Refill HCTZ pt just recieved a 3 month supply of both medications, per Dr. Mariah Milling ok to continue taking both meds.

## 2010-03-01 NOTE — Medication Information (Signed)
Summary: CCR/AMD  Anticoagulant Therapy  Managed by: Cloyde Reams, RN, BSN Referring MD: Dr Mariah Milling PCP: [name], MD Supervising MD: Mariah Milling Indication 1: DVT 451.1 Lab Used: [facility] [place] Site: [place] INR POC 2.2 INR RANGE 2.0-3.0  Dietary changes: no    Health status changes: yes       Details: C/O incr cramps in his legs.  Bleeding/hemorrhagic complications: no     Any changes in medication regimen? yes       Details[place] x 1 week ago has 3-4 days left.     Any missed doses?: no       Is patient compliant with meds? yes      Comments: Pt states he has been taking 6mg  of coumadin daily.    Allergies: 1)  ! * Lisinopril  Anticoagulation Management History:      The patient is taking warfarin and comes in today for a routine follow up visit.  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  Positive risk factors for bleeding include presence of serious comorbidities.  Negative risk factors for bleeding include an age less than 17 years old and no history of CVA/TIA.  The bleeding index is 'intermediate risk'.  Positive CHADS2 values include History of HTN and History of Diabetes.  Negative CHADS2 values include Age > 64 years old and Prior Stroke/CVA/TIA.  Plans are to continue warfarin for life.  His last INR was 3.0.  Anticoagulation responsible provider: [facility]  INR POC: 2.2.  Cuvette Lot#: [id number].  Exp: 08/2010.    Anticoagulation Management Assessment/Plan:      The patient's current anticoagulation dose is Warfarin sodium 1 mg tabs: Use as directed by Anticoagualtion Clinic, Warfarin sodium 5 mg tabs: Use as directed by Anticoagulation Clinic.  The target INR is 2.0-3.0.  The next INR is due 08/05/2009.  Anticoagulation instructions were given to patient.  Results were  reviewed/authorized by Cloyde Reams, RN, BSN.  He was notified by Cloyde Reams RN.         Prior Anticoagulation Instructions: INR 2.9  Continue on current coumadin dosage 6mg /6.5mg  every other day.  Recheck in 4 weeks.      Current Anticoagulation Instructions: INR 2.2  Continue on same dosage 6mg  daily.  Recheck in 4 weeks.

## 2010-03-01 NOTE — Medication Information (Signed)
Summary: rov/ewj  Anticoagulant Therapy  Managed by: Bethena Midget, RN, BSN Referring MD: Dr Mariah Milling PCP: [name], MD Supervising MD: Mariah Milling Indication 1: DVT 451.1 Lab Used: [facility] [place] Site: [place] INR POC 3.5 INR RANGE 2.0-3.0  Dietary changes: no    Health status changes: no    Bleeding/hemorrhagic complications: no    Recent/future hospitalizations: no    Any changes in medication regimen? no    Recent/future dental: no  Any missed doses?: no       Is patient compliant with meds? yes       Allergies: 1)  ! * Lisinopril  Anticoagulation Management History:      The patient is taking warfarin and comes in today for a routine follow up visit.  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  Positive risk factors for bleeding include presence of serious comorbidities.  Negative risk factors for bleeding include an age less than 87 years old and no history of CVA/TIA.  The bleeding index is 'intermediate risk'.  Positive CHADS2 values include History of HTN and History of Diabetes.  Negative CHADS2 values include Age > 26 years old and Prior Stroke/CVA/TIA.  Plans are to continue warfarin for life.  His last INR was 3.0.  Anticoagulation responsible provider: [facility].  INR POC: 3.5.  Cuvette Lot#: [id number].  Exp: 12/2010.    Anticoagulation Management Assessment/Plan:      The patient's current anticoagulation dose is Warfarin sodium 1 mg tabs: Use as directed by Anticoagualtion Clinic, Warfarin sodium 5 mg tabs: Use as directed by Anticoagulation Clinic.  The target INR is 2.0-3.0.  The next INR is due 01/12/2010.  Anticoagulation instructions were given to patient.  Results were reviewed/authorized by Bethena Midget, RN, BSN.  He was notified by Bethena Midget, RN, BSN.         Prior Anticoagulation Instructions: INR  3.8  Skip today's dosage of Coumadin, then start taking 5mg  daily except 6mg  on Tuesdays and Saturdays.  Recheck in 2 weeks.   Current Anticoagulation Instructions: INR 3.5 Skip today then change to 5mg s everyday. Recheck in 2 weeks.

## 2010-03-01 NOTE — Progress Notes (Signed)
Summary: RX -- warfarin  Phone Note Refill Request Call back at Home Phone [phone] Message from:  Patient on July 15, 2009 1:07 PM  Refills Requested: Medication #1:  WARFARIN SODIUM 1 MG TABS Use as directed by Anticoagualtion Clinic WOULD LIKE A 90 DAY RX-[facility] [address] [place]  Initial call taken by: Harlon Flor,  July 15, 2009 1:07 PM    Prescriptions: WARFARIN SODIUM 1 MG TABS (WARFARIN SODIUM) Use as directed by Anticoagualtion Clinic  #135 x 3   Entered by:   Hardin Negus, RMA   Authorized by:   Dossie Arbour MD   Signed by:   Hardin Negus, RMA on 07/15/2009   Method used:   Electronically to        [facility] [address]retail)       [address], [address]       [place], [place]  [address]       Ph: [phone]       Fax: [phone]   RxID:   [id number]

## 2010-03-01 NOTE — Medication Information (Signed)
Summary: rov/ewj  Anticoagulant Therapy  Managed by: Cloyde Reams, RN, BSN Referring MD: Dr Mariah Milling PCP: [name], MD Supervising MD: Mariah Milling Indication 1: DVT 451.1 Lab Used: [facility] [place] Site: [place] INR POC 3.6 INR RANGE 2.0-3.0  Dietary changes: no    Health status changes: no    Bleeding/hemorrhagic complications: no    Recent/future hospitalizations: no    Any changes in medication regimen? no    Recent/future dental: no  Any missed doses?: yes     Details: May have missed 1 tablet 1 week ago.  Is patient compliant with meds? yes       Allergies: 1)  ! * Lisinopril  Anticoagulation Management History:      The patient is taking warfarin and comes in today for a routine follow up visit.  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  Positive risk factors for bleeding include presence of serious comorbidities.  Negative risk factors for bleeding include an age less than 54 years old and no history of CVA/TIA.  The bleeding index is 'intermediate risk'.  Positive CHADS2 values include History of HTN and History of Diabetes.  Negative CHADS2 values include Age > 16 years old and Prior Stroke/CVA/TIA.  Plans are to continue warfarin for life.  His last INR was 3.0.  Anticoagulation responsible provider: [facility].  INR POC: 3.6.  Cuvette Lot#: [id number].  Exp: 10/2010.    Anticoagulation Management Assessment/Plan:      The patient's current anticoagulation dose is Warfarin sodium 1 mg tabs: Use as directed by Anticoagualtion Clinic, Warfarin sodium 5 mg tabs: Use as directed by Anticoagulation Clinic.  The target INR is 2.0-3.0.  The next INR is due 10/06/2009.  Anticoagulation instructions were given to patient.  Results were reviewed/authorized by Cloyde Reams, RN, BSN.  He was notified by Cloyde Reams RN.         Prior  Anticoagulation Instructions: INR 3.4  Skip today's dosage of Coumadin, then resume same dosage 6mg  daily.  Recheck in 3 weeks.    Current Anticoagulation Instructions: INR 3.6  Skip today's dosage of Coumadin, then start taking 5mg  daily except 6mg  on Mondays, Wednesdays, and Fridays.  Recheck in 2 weeks.

## 2010-03-01 NOTE — Medication Information (Signed)
Summary: CCR/AMD  Anticoagulant Therapy  Managed by: Cloyde Reams, RN, BSN Referring MD: Dr Mariah Milling PCP: [name], MD Supervising MD: Mariah Milling Indication 1: DVT 451.1 Lab Used: [facility] [place] Site: [place] INR POC 2.9 INR RANGE 2.0-3.0    Bleeding/hemorrhagic complications: no     Any changes in medication regimen? no     Any missed doses?: yes     Details: Missed dose 2-3 weeks ago  Is patient compliant with meds? yes       Allergies: 1)  ! * Lisinopril  Anticoagulation Management History:      The patient is taking warfarin and comes in today for a routine follow up visit.  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  Positive risk factors for bleeding include presence of serious comorbidities.  Negative risk factors for bleeding include an age less than 19 years old and no history of CVA/TIA.  The bleeding index is 'intermediate risk'.  Positive CHADS2 values include History of HTN and History of Diabetes.  Negative CHADS2 values include Age > 73 years old and Prior Stroke/CVA/TIA.  Plans are to continue warfarin for life.  His last INR was 3.0.  Anticoagulation responsible provider: [facility].  INR POC: 2.9.  Cuvette Lot#: [id number].  Exp: 10/2010.    Anticoagulation Management Assessment/Plan:      The patient's current anticoagulation dose is Warfarin sodium 1 mg tabs: Use as directed by Anticoagualtion Clinic, Warfarin sodium 5 mg tabs: Use as directed by Anticoagulation Clinic.  The target INR is 2.0-3.0.  The next INR is due 09/01/2009.  Anticoagulation instructions were given to patient.  Results were reviewed/authorized by Cloyde Reams, RN, BSN.  He was notified by Benedict Needy, RN.         Prior Anticoagulation Instructions: INR 2.2  Continue on same dosage 6mg  daily.  Recheck in 4 weeks.    Current Anticoagulation  Instructions: INR 2.9   Continue taking 1 tab daily. Recheck in 4 weeks.

## 2010-03-01 NOTE — Medication Information (Signed)
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  Health status changes: no    Bleeding/hemorrhagic complications: no    Recent/future hospitalizations: no    Any changes in medication regimen? no    Recent/future dental: no  Any missed doses?: no       Is patient compliant with meds? yes       Allergies: 1)  ! * Lisinopril  Anticoagulation Management History:      The patient is taking warfarin and comes in today for a routine follow up visit.  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  Positive risk factors for bleeding include presence of serious comorbidities.  Negative risk factors for bleeding include an age less than 51 years old and no history of CVA/TIA.  The bleeding index is 'intermediate risk'.  Positive CHADS2 values include History of HTN and History of Diabetes.  Negative CHADS2 values include Age > 56 years old and Prior Stroke/CVA/TIA.  Plans are to continue warfarin for life.  His last INR was 3.0.  Anticoagulation responsible Jahniyah Revere: [place].  INR POC: 3.4.  Cuvette Lot#: [id number].  Exp: 10/2010.    Anticoagulation Management Assessment/Plan:      The patient's current anticoagulation dose is Warfarin sodium 1 mg tabs: Use as directed by Anticoagualtion Clinic, Warfarin sodium 5 mg tabs: Use as directed by Anticoagulation Clinic.  The target INR is 2.0-3.0.  The next INR is due 09/22/2009.  Anticoagulation instructions were given to patient.  Results were reviewed/authorized by Cloyde Reams, RN, BSN.  He was notified by Cloyde Reams RN.         Prior  Anticoagulation Instructions: INR 2.9   Continue taking 1 tab daily. Recheck in 4 weeks.   Current Anticoagulation Instructions: INR 3.4  Skip today's dosage of Coumadin, then resume same dosage 6mg  daily.  Recheck in 3 weeks.

## 2010-03-01 NOTE — Assessment & Plan Note (Signed)
Summary: NP6/AMD   Visit Type:  NP6 Referring Provider:  Mariah Milling Primary Provider:  [name], MD  CC:  shortness of breath all the time, no cp, and edema in ankles and feet all the time.  History of Present Illness: Mr. Achey is a 60 year old gentleman with a history of coronary artery disease, bypass surgery March 2008, diabetes, obesity, history of DVT in the left leg but these results are not available to [place] at this time, he is on chronic warfarin who presents to reestablish care. He was last seen at [facility] July 2010.  Mr. Byrd states overall he is doing well. He does not exercise to any significant degree. He spends most of his free time watching TV and playing on the computer to late at night. He sleeps till late into the morning. He receives most of his medications at a discounted rate and states that they're not very expensive to him. Overall he has no new complaints.  last stress test July 2008 showing mild ischemia in the mid anteroseptal, apical anterior and apical septal region. Ejection fraction 40%. This test was done post bypass. No cardiac catheterization was done in followup as he had no symptoms.  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 Take 1 Tab By Mouth Daily For Mood 2)  Warfarin Sodium 1 Mg Tabs (Warfarin Sodium) .... Use As Directed By Anticoagualtion Clinic 3)  Bystolic 10 Mg  Tabs (Nebivolol Hcl) .... Take 1 Tablet By Mouth Once A Day 4)  Micardis Hct 80-12.5 Mg  Tabs (Telmisartan-Hctz) .Marland Kitchen.. 1 Tablet By Mouth Daily 5)  Aspirin 325 Mg  Tbec (Aspirin) .... Take 1 Tablet By Mouth Once A Day 6)  Loratadine 10 Mg  Tabs (Loratadine) .Marland Kitchen.. 1 Tablet By Mouth Daily As Needed 7)  Crestor 40 Mg Tabs (Rosuvastatin Calcium) .... Take 1 Tablet By Mouth Once Daily 8)  Hydrochlorothiazide 25 Mg Tabs (Hydrochlorothiazide) .... Take One (1) By Mouth Daily 9)  Humulin 70/30 Pen 70-30 % Susp (Insulin Isophane & Regular) .... 35 Units Subcutaneously in The Morning and 35 Units Subcutaneously At Night 10)  Novofine 31 31g X 6 Mm Misc (Insulin Pen Needle) .... To Use With Insulin Pen Twice Daily 11)  Novofine 32g X 6 Mm Misc (Insulin Pen Needle) .... To Use With Inuslin Pen Twice Daily Dx 250.02 12)  Relion Blood Glucose Test  Strp (Glucose Blood) .... To Use With Glucometer To Check Blood Sugar Three Times Daily 13)  Warfarin Sodium 5 Mg Tabs (Warfarin Sodium) .... Use As Directed By Anticoagulation Clinic 14)  Furosemide 20 Mg Tabs (Furosemide) .... Take One Tablet By Mouth Daily As Needed 15)  Flax Seed Oil 1000 Mg Caps (  Flaxseed (Linseed)) .... 2 By Mouth Two Times A Day 16)  Sm Green Tea Complex 1000 Mg Tabs (Green Tea (Camillia Sinensis)) .... 2 By Mouth Once Daily  Allergies (verified): 1)  ! * Lisinopril  Past History:  Past Medical History: Last updated: 05/24/2009 Current Problems:  HYPERCHOLESTEROLEMIA (ICD-272.0) DEPRESSION (ICD-311) DIABETES MELLITUS (ICD-250.00) DEEP VENOUS THROMBOPHLEBITIS, LEG, LEFT (ICD-453.40) HYPERTENSION (ICD-401.9) CAD  Past Surgical History: Last updated: 09/25/2006 Bi pass surgery 2/08  Risk  Factors: Alcohol Use: 0 (06/10/2009) Caffeine Use: 2-3 (06/10/2009) Exercise: no (06/10/2009)  Risk Factors: Smoking Status: never (06/10/2009) Passive Smoke Exposure: no (10/19/2006)  Social History: Alcohol drinks/day:  0 Caffeine use/day:  2-3  Review of Systems       The patient complains of weight gain.  The patient denies fever, weight loss, vision loss, decreased hearing, hoarseness, chest pain, syncope, dyspnea on exertion, peripheral edema, prolonged cough, abdominal pain, incontinence, muscle weakness, depression, and enlarged lymph nodes.    Vital Signs:  Patient profile:   60 year old male Height:      68 inches Weight:      279 pounds BMI:     42.58 Pulse rate:   68 / minute Pulse rhythm:   regular BP sitting:   120 / 70  (left arm) Cuff size:   large  Vitals Entered By: Mercer Pod (Jun 10, 2009 10:31 AM)  Physical Exam  General:  middle-age gentleman in no apparent distress, HEENT exam is benign, oropharynx is clear, neck is supple with no JVP or carotid bruits, heart sounds are regular with S1-S2 and no murmurs appreciated, lungs are clear to auscultation with no wheezes Rales, abdominal exam is benign, no significant lower extremity edema, neurologic exam is nonfocal, skin is warm and dry. Pulses are equal and symmetrical in his upper and lower extremities.    EKG  Procedure date:  06/10/2009  Findings:      left bundle branch block, normal sinus rhythm with rate of 68 beats per minute. No change from prior EKGs.  Impression & Recommendations:  Problem # 1:  CAD, ARTERY BYPASS GRAFT (ICD-414.04) history of coronary artery disease with bypass surgery in March 2008. No followup cardiac catheter since that time. Stress test in July 2008 showing small regions of ischemia though as he was asymptomatic, no followup catheterization was performed. He has been managed medically over the past 3 years with no significant new symptoms. He is on aspirin and he  is a nonsmoker. He is very sedentary have asked him to increase his exercise.  His updated medication list for this problem includes:    Warfarin Sodium 1 Mg Tabs (Warfarin sodium) ..... Use as directed by anticoagualtion clinic    Bystolic 10 Mg Tabs (Nebivolol hcl) .Marland Kitchen... Take 1 tablet by mouth once a day    Aspirin 325 Mg Tbec (Aspirin) .Marland Kitchen... Take 1 tablet by mouth once a day    Warfarin Sodium 5 Mg Tabs (Warfarin sodium) ..... Use as directed by anticoagulation clinic  Problem # 2:  HYPERLIPIDEMIA-MIXED (ICD-272.4) We will have his lipids and liver check today as it has not been done for over a year.  His updated medication list for this problem includes:    Crestor 40 Mg Tabs (Rosuvastatin calcium) .Marland Kitchen... Take 1 tablet by mouth once daily  Problem # 3:  DIABETES MELLITUS, TYPE II, UNCONTROLLED (ICD-250.02) I asked him to followup with Dr. [name] for his diabetes. He is obese, I believe he's not been checking his sugars.  I will see if we can add an hemoglobin A1c onto his labs from today  His updated medication list for this problem includes:    Micardis Hct 80-12.5 Mg Tabs (Telmisartan-hctz) .Marland Kitchen... 1 tablet by mouth daily    Aspirin 325 Mg Tbec (Aspirin) .Marland Kitchen... Take 1 tablet by mouth once a day    Humulin 70/30 Pen 70-30 % Susp (Insulin isophane & regular) .Marland KitchenMarland KitchenMarland KitchenMarland Kitchen 35 units subcutaneously in the morning and 35 units subcutaneously at night  Problem # 4:  HYPERTENSION (ICD-401.9) blood pressure is well controlled on his current medication regiment. No changes were made.  His updated medication list for this problem includes:    Bystolic 10 Mg Tabs (Nebivolol hcl) .Marland Kitchen... Take 1 tablet by mouth once a day    Micardis Hct 80-12.5 Mg Tabs (Telmisartan-hctz) .Marland Kitchen... 1 tablet by mouth daily    Aspirin 325 Mg Tbec (Aspirin) .Marland Kitchen... Take 1 tablet by mouth once a day    Hydrochlorothiazide 25 Mg Tabs (Hydrochlorothiazide) .Marland Kitchen... Take one (1) by mouth daily    Furosemide 20 Mg Tabs (Furosemide)  .Marland Kitchen... Take one tablet by mouth daily as needed  Problem # 5:  DVT (ICD-453.40) History of DVT several years ago these records are not available to [place]. He states that it was in the left lower extremity and he is on warfarin on a chronic basis. He is to check his INR to [facility] and we would follow this at [facility]. We'll check it today.  Patient Instructions: 1)  Your physician recommends that you schedule a follow-up appointment in: 4 weeks for coumadin and 6 months with Dr Mariah Milling.    Appended Document: Orders Update    Clinical Lists Changes  Orders: Added new Test order of T-Lipid Profile [phone]) - Signed Added new Test order of T-Hepatic Function [phone]) - Signed

## 2010-03-01 NOTE — Letter (Signed)
Summary: PHI  PHI   Imported By: Harlon Flor 06/14/2009 10:13:03  _____________________________________________________________________  External Attachment:    Type:   Image     Comment:   External Document

## 2010-03-01 NOTE — Medication Information (Signed)
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  Health status changes: no    Bleeding/hemorrhagic complications: no    Recent/future hospitalizations: no    Any changes in medication regimen? no    Recent/future dental: no  Any missed doses?: yes     Details: May have taken 6mg  on Sunday instead of 5mg .   Is patient compliant with meds? yes       Allergies: 1)  ! * Lisinopril  Anticoagulation Management History:      The patient is taking warfarin and comes in today for a routine follow up visit.  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  Positive risk factors for bleeding include presence of serious comorbidities.  Negative risk factors for bleeding include an age less than 26 years old and no history of CVA/TIA.  The bleeding index is 'intermediate risk'.  Positive CHADS2 values include History of HTN and History of Diabetes.  Negative CHADS2 values include Age > 62 years old and Prior Stroke/CVA/TIA.  Plans are to continue warfarin for life.  His last INR was 3.0.  Anticoagulation responsible provider: Bensimhon MD, Reuel Boom.  INR POC: 3.8.  Exp: 12/2010.    Anticoagulation Management Assessment/Plan:      The patient's current anticoagulation dose is Warfarin sodium 1 mg tabs: Use as directed by Anticoagualtion Clinic, Warfarin sodium 5 mg tabs: Use as directed by Anticoagulation Clinic.  The target INR is 2.0-3.0.  The next INR is due 12/29/2009.  Anticoagulation instructions were given to patient.  Results were reviewed/authorized by Cloyde Reams, RN, BSN.  He was notified by Cloyde Reams RN.         Prior Anticoagulation Instructions: INR 3.0  Continue on same dosage 5mg  daily except 6mg  on Tuesdays, Thursdays, and Saturdays.  Recheck in 4 weeks.    Current Anticoagulation Instructions: INR 3.8  Skip today's dosage of Coumadin, then start taking 5mg  daily except 6mg  on Tuesdays and Saturdays.  Recheck in 2 weeks.

## 2010-03-01 NOTE — Miscellaneous (Signed)
Summary: Bystolic  Clinical Lists Changes  Medications: Changed medication from BYSTOLIC 10 MG  TABS (NEBIVOLOL HCL) Take 1 tablet by mouth once a day to BYSTOLIC 10 MG  TABS (NEBIVOLOL HCL) Take 1 tablet by mouth once a day - Signed Rx of BYSTOLIC 10 MG  TABS (NEBIVOLOL HCL) Take 1 tablet by mouth once a day;  #30 x 6;  Signed;  Entered by: Benedict Needy, RN;  Authorized by: Dossie Arbour MD;  Method used: Electronically to [facility] [address], [place], [place], [place]  [address], Ph: [phone], Fax: [phone]    Prescriptions: BYSTOLIC 10 MG  TABS (NEBIVOLOL HCL) Take 1 tablet by mouth once a day  #30 x 6   Entered by:   Benedict Needy, RN   Authorized by:   Dossie Arbour MD   Signed by:   Benedict Needy, RN on 08/26/2009   Method used:   Electronically to        [facility] [address] (retail)       [address], [address]       [place], [place]  [address]       Ph: [phone]       Fax: [phone]   RxID:   [id number]

## 2010-03-01 NOTE — Letter (Signed)
Summary: Medical Record Release  Medical Record Release   Imported By: Harlon Flor 06/09/2009 16:18:47  _____________________________________________________________________  External Attachment:    Type:   Image     Comment:   External Document

## 2010-03-01 NOTE — Medication Information (Signed)
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 no    Recent/future dental: no  Any missed doses?: no       Is patient compliant with meds? yes       Allergies: 1)  ! * Lisinopril  Anticoagulation Management History:      The patient is taking warfarin and comes in today for a routine follow up visit.  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  Positive risk factors for bleeding include presence of serious comorbidities.  Negative risk factors for bleeding include an age less than 101 years old and no history of CVA/TIA.  The bleeding index is 'intermediate risk'.  Positive CHADS2 values include History of HTN and History of Diabetes.  Negative CHADS2 values include Age > 108 years old and Prior Stroke/CVA/TIA.  Plans are to continue warfarin for life.  His last INR was 3.0.  Anticoagulation responsible provider: Shirlee Latch MD, [place].  INR POC: 3.3.  Cuvette Lot#: [id number].  Exp: 12/2010.    Anticoagulation Management Assessment/Plan:      The patient's current anticoagulation dose is Warfarin sodium 1 mg tabs: Use as directed by Anticoagualtion Clinic, Warfarin sodium 5 mg tabs: Use as directed by Anticoagulation Clinic.  The target INR is 2.0-3.0.  The next INR is due 11/17/2009.  Anticoagulation instructions were given to patient.  Results were reviewed/authorized by Cloyde Reams, RN, BSN.  He was notified by Cloyde Reams RN.         Prior Anticoagulation  Instructions: INR 1.9  Start taking 6mg  daily except 5mg  on Tuesdays and Saturdays.  Recheck in 2-3 weeks.    Current Anticoagulation Instructions: INR 3.3  Skip today's dosage of Coumadin, then start taking 6mg  daily except 5mg  on Tuesdays, Thursdays and Saturdays.  Recheck in 3 weeks.

## 2010-03-01 NOTE — Progress Notes (Signed)
Summary: change crestor  Phone Note Call from Patient   Caller: Patient Summary of Call: Can't tolerate crestor causing muscle aches and would like to switch back to simvastatin. Initial call taken by: Bishop Dublin, CMA,  October 13, 2009 5:00 PM  Follow-up for Phone Call        Has he tried a lower dose of crestor first? Has he tried simva? at what dose?     Appended Document: change crestor notified patient need to try a low dose of crestor 10mg .  Gave samples of crestor 10mg , he will try for two weeks and call us back with any muscle aches.

## 2010-03-01 NOTE — Medication Information (Signed)
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 yes       Details: Stopped Crestor.   Recent/future dental: no  Any missed doses?: no       Is patient compliant with meds? yes      Comments: Pt states he has been taking 5mg  once daily/6mg  TuThSa  Allergies: 1)  ! * Lisinopril  Anticoagulation Management History:      The patient is taking warfarin and comes in today for a routine follow up visit.  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  Positive risk factors for bleeding include presence of serious comorbidities.  Negative risk factors for bleeding include an age less than 1 years old and no history of CVA/TIA.  The bleeding index is 'intermediate risk'.  Positive CHADS2 values include History of HTN and History of Diabetes.  Negative CHADS2 values include Age > 24 years old and Prior Stroke/CVA/TIA.  Plans are to continue warfarin for life.  His last INR was 3.0.  Anticoagulation responsible Gerlean Cid: Bensimhon MD, Reuel Boom.  INR POC: 3.0.  Cuvette Lot#: [id number].  Exp: 12/2010.    Anticoagulation Management Assessment/Plan:      The patient's current anticoagulation dose is Warfarin sodium 1 mg tabs: Use as directed by Anticoagualtion Clinic, Warfarin sodium 5 mg tabs: Use as directed by Anticoagulation Clinic.  The target INR is 2.0-3.0.  The next INR is due 12/15/2009.  Anticoagulation instructions were given to patient.  Results were reviewed/authorized  by Cloyde Reams, RN, BSN.  He was notified by Cloyde Reams RN.         Prior Anticoagulation Instructions: INR 3.3  Skip today's dosage of Coumadin, then start taking 6mg  daily except 5mg  on Tuesdays, Thursdays and Saturdays.  Recheck in 3 weeks.    Current Anticoagulation Instructions: INR 3.0  Continue on same dosage 5mg  daily except 6mg  on Tuesdays, Thursdays, and Saturdays.  Recheck in 4 weeks.

## 2010-03-01 NOTE — Medication Information (Signed)
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 no     Any missed doses?: no        Comments: Pt has not had coumadin check in over 1 year.  Pt states current coumadin regimen is 6mg  alternating with 6.5mg  every other day.  Allergies: 1)  ! * Lisinopril  Anticoagulation Management History:      The patient is taking warfarin and comes in today for a routine follow up visit.  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  Positive risk factors for bleeding include presence of serious comorbidities.  Negative risk factors for bleeding include an age less than 52 years old and no history of CVA/TIA.  The bleeding index is 'intermediate risk'.  Positive CHADS2 values include History of HTN and History of Diabetes.  Negative CHADS2 values include Age > 27 years old and Prior Stroke/CVA/TIA.  Plans are to continue warfarin for life.  His last INR was 3.0.  Anticoagulation responsible provider: Margrette Wynia.  INR POC: 2.9.  Cuvette Lot#: [id number].  Exp: 05/2010.    Anticoagulation Management Assessment/Plan:      The patient's current anticoagulation dose is Warfarin sodium 1 mg tabs: Use as directed by Anticoagualtion Clinic, Warfarin sodium 5 mg tabs: Use as directed by Anticoagulation Clinic.  The target INR is 2.0-3.0.  The next INR is due 07/08/2009.  Anticoagulation instructions were given to patient.  Results were reviewed/authorized by Cloyde Reams, RN, BSN.  He was notified by Cloyde Reams RN.         Prior Anticoagulation Instructions: Pt is not getting meds regulated by [facility]  Current Anticoagulation Instructions: INR 2.9  Continue on current coumadin dosage 6mg /6.5mg  every other day.  Recheck in 4 weeks.

## 2010-03-01 NOTE — Medication Information (Signed)
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 no    Recent/future dental: no  Any missed doses?: no       Is patient compliant with meds? yes       Allergies: 1)  ! * Lisinopril  Anticoagulation Management History:      The patient is taking warfarin and comes in today for a routine follow up visit.  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  Positive risk factors for bleeding include presence of serious comorbidities.  Negative risk factors for bleeding include an age less than 70 years old and no history of CVA/TIA.  The bleeding index is 'intermediate risk'.  Positive CHADS2 values include History of HTN and History of Diabetes.  Negative CHADS2 values include Age > 2 years old and Prior Stroke/CVA/TIA.  Plans are to continue warfarin for life.  His last INR was 3.0.  Anticoagulation responsible provider: [facility].  INR POC: 1.9.  Cuvette Lot#: [id number].  Exp: 10/2010.    Anticoagulation Management Assessment/Plan:      The patient's current anticoagulation dose is Warfarin sodium 1 mg tabs: Use as directed by Anticoagualtion Clinic, Warfarin sodium 5 mg tabs: Use as directed by Anticoagulation Clinic.  The target INR is 2.0-3.0.  The next INR is due 10/27/2009.  Anticoagulation instructions were given to patient.  Results were reviewed/authorized by Cloyde Reams, RN, BSN.  He was notified by Cloyde Reams RN.         Prior Anticoagulation Instructions: INR 3.6  Skip  today's dosage of Coumadin, then start taking 5mg  daily except 6mg  on Mondays, Wednesdays, and Fridays.  Recheck in 2 weeks.    Current Anticoagulation Instructions: INR 1.9  Start taking 6mg  daily except 5mg  on Tuesdays and Saturdays.  Recheck in 2-3 weeks.

## 2010-03-03 NOTE — Medication Information (Signed)
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 no    Recent/future dental: no  Any missed doses?: no       Is patient compliant with meds? yes       Allergies: 1)  ! * Lisinopril  Anticoagulation Management History:      The patient is taking warfarin and comes in today for a routine follow up visit.  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  Positive risk factors for bleeding include presence of serious comorbidities.  Negative risk factors for bleeding include an age less than 69 years old and no history of CVA/TIA.  The bleeding index is 'intermediate risk'.  Positive CHADS2 values include History of HTN and History of Diabetes.  Negative CHADS2 values include Age > 49 years old and Prior Stroke/CVA/TIA.  Plans are to continue warfarin for life.  His last INR was 3.0.  Anticoagulation responsible provider: Kou Gucciardo.  INR POC: 2.0.  Exp: 12/2010.    Anticoagulation Management Assessment/Plan:      The patient's current anticoagulation dose is Warfarin sodium 1 mg tabs: Use as directed by Anticoagualtion Clinic, Warfarin sodium 5 mg tabs: Use as directed by Anticoagulation Clinic.  The target INR is 2.0-3.0.  The next INR is due 02/23/2010.  Anticoagulation instructions were given to patient.  Results were reviewed/authorized by Lanny Hurst, RN.  He was notified by Lanny Hurst RN.         Prior Anticoagulation Instructions: INR 1.8  Take 7.5mg   today and tomorrow, then resume  same dosage 5mg  daily.  Recheck in 2 weeks.    Current Anticoagulation Instructions: INR 2.0  Continue same dosage 5mg  once daily. Recheck in 3 weeks.

## 2010-03-03 NOTE — Medication Information (Signed)
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 yes       Details: Discontinued Vytorin d/t leg cramps.   Recent/future dental: no  Any missed doses?: no       Is patient compliant with meds? yes       Allergies: 1)  ! * Lisinopril  Anticoagulation Management History:      The patient is taking warfarin and comes in today for a routine follow up visit.  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  Positive risk factors for bleeding include presence of serious comorbidities.  Negative risk factors for bleeding include an age less than 59 years old and no history of CVA/TIA.  The bleeding index is 'intermediate risk'.  Positive CHADS2 values include History of HTN and History of Diabetes.  Negative CHADS2 values include Age > 59 years old and Prior Stroke/CVA/TIA.  Plans are to continue warfarin for life.  His last INR was 3.0.  Anticoagulation responsible provider: Michae Grimley MD, Reuel Boom.  INR POC: 2.4.  Cuvette Lot#: [id number].  Exp: 03/2011.    Anticoagulation Management Assessment/Plan:      The patient's current anticoagulation dose is Warfarin sodium 1 mg tabs: Use as directed by Anticoagualtion Clinic, Warfarin sodium 5 mg tabs: Use as directed by Anticoagulation Clinic.  The target INR is 2.0-3.0.  The next INR is due 03/23/2010.  Anticoagulation instructions were given to patient.  Results were reviewed/authorized by Cloyde Reams, RN, BSN.  He was notified  by Cloyde Reams RN.         Prior Anticoagulation Instructions: INR 2.0  Continue same dosage 5mg  once daily. Recheck in 3 weeks.  Current Anticoagulation Instructions: INR 2.4  Continue on same dosage 5mg  daily.  Recheck in 4 weeks.

## 2010-03-03 NOTE — Medication Information (Signed)
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 yes       Details: CoQ10  Recent/future dental: no  Any missed doses?: yes     Details: Missed last night's dosage of Coumadin as well as Saturday's dosage   Is patient compliant with meds? yes       Allergies: 1)  ! * Lisinopril  Anticoagulation Management History:      The patient is taking warfarin and comes in today for a routine follow up visit.  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  Positive risk factors for bleeding include presence of serious comorbidities.  Negative risk factors for bleeding include an age less than 55 years old and no history of CVA/TIA.  The bleeding index is 'intermediate risk'.  Positive CHADS2 values include History of HTN and History of Diabetes.  Negative CHADS2 values include Age > 75 years old and Prior Stroke/CVA/TIA.  Plans are to continue warfarin for life.  His last INR was 3.0.  Anticoagulation responsible provider: [facility].  INR POC: 1.8.  Cuvette Lot#: [id number].  Exp: 12/2010.    Anticoagulation Management Assessment/Plan:      The patient's current anticoagulation dose is Warfarin sodium 1 mg tabs: Use as directed by Anticoagualtion Clinic, Warfarin sodium 5 mg tabs: Use as directed by Anticoagulation Clinic.  The target INR is 2.0-3.0.  The next INR is due 01/26/2010.  Anticoagulation instructions were given to patient.  Results were reviewed/authorized by Cloyde Reams, RN, BSN.   He was notified by Cloyde Reams RN.         Prior Anticoagulation Instructions: INR 3.5 Skip today then change to 5mg s everyday. Recheck in 2 weeks.   Current Anticoagulation Instructions: INR 1.8  Take 7.5mg   today and tomorrow, then resume same dosage 5mg  daily.  Recheck in 2 weeks.

## 2010-03-08 ENCOUNTER — Telehealth: Payer: Self-pay | Admitting: Cardiovascular Disease

## 2010-03-17 NOTE — Progress Notes (Signed)
Summary: RX  Phone Note Refill Request Call back at Home Phone [phone] Message from:  Patient on March 08, 2010 4:27 PM  Refills Requested: Medication #1:  MICARDIS HCT 80-12.5 MG  TABS 1 tablet by mouth daily [facility] [address]  Initial call taken by: Harlon Flor,  March 08, 2010 4:27 PM    Prescriptions: MICARDIS HCT 80-12.5 MG  TABS (TELMISARTAN-HCTZ) 1 tablet by mouth daily  #90 Each x 0   Entered by:   Bishop Dublin, CMA   Authorized by:   Dossie Arbour MD   Signed by:   Bishop Dublin, CMA on 03/08/2010   Method used:   Electronically to        [facility] [address] (retail)       [address], [address]       [place], [place]  [address]       Ph: [phone]       Fax: [phone]   RxID:   [id number]

## 2010-03-23 ENCOUNTER — Encounter: Payer: Self-pay | Admitting: Cardiovascular Disease

## 2010-03-23 ENCOUNTER — Encounter (INDEPENDENT_AMBULATORY_CARE_PROVIDER_SITE_OTHER): Payer: PRIVATE HEALTH INSURANCE

## 2010-03-23 DIAGNOSIS — I80299 Phlebitis and thrombophlebitis of other deep vessels of unspecified lower extremity: Secondary | ICD-10-CM

## 2010-03-23 DIAGNOSIS — Z7901 Long term (current) use of anticoagulants: Secondary | ICD-10-CM

## 2010-03-29 NOTE — Medication Information (Signed)
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 yes       Details: Wishes to retry Crestor.   Recent/future dental: no  Any missed doses?: no       Is patient compliant with meds? yes      Comments: Pt given samples of Crestor 5mg  samples to try.  Cloyde Reams RN  March 23, 2010 5:04 PM   Allergies: 1)  ! * Lisinopril  Anticoagulation Management History:      The patient is taking warfarin and comes in today for a routine follow up visit.  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  Positive risk factors for bleeding include presence of serious comorbidities.  Negative risk factors for bleeding include an age less than 57 years old and no history of CVA/TIA.  The bleeding index is 'intermediate risk'.  Positive CHADS2 values include History of HTN and History of Diabetes.  Negative CHADS2 values include Age > 52 years old and Prior Stroke/CVA/TIA.  Plans are to continue warfarin for life.  His last INR was 3.0.  Anticoagulation responsible provider: [facility].  INR POC: 2.2.  Cuvette Lot#: [id number].  Exp: 01/2011.    Anticoagulation Management Assessment/Plan:      The patient's current anticoagulation dose is Warfarin sodium 1 mg tabs: Use as directed by Anticoagualtion Clinic, Warfarin sodium 5 mg tabs: Use as directed by Anticoagulation Clinic.  The target INR is 2.0-3.0.  The next INR is due 04/20/2010.  Anticoagulation instructions were given to patient.  Results  were reviewed/authorized by Cloyde Reams, RN, BSN.  He was notified by Cloyde Reams RN.         Prior Anticoagulation Instructions: INR 2.4  Continue on same dosage 5mg  daily.  Recheck in 4 weeks.    Current Anticoagulation Instructions: INR 2.2  Continue on same dosage 5mg  daily.  Recheck in 4 weeks.   Prescriptions: CRESTOR 5 MG TABS (ROSUVASTATIN CALCIUM) Take one tablet by mouth daily.  #28 x 0   Entered by:   Cloyde Reams RN   Authorized by:   Dossie Arbour MD   Signed by:   Cloyde Reams RN on 03/23/2010   Method used:   Samples Given   RxID:   [id number]

## 2010-04-04 ENCOUNTER — Telehealth: Payer: Self-pay | Admitting: Cardiovascular Disease

## 2010-04-12 NOTE — Progress Notes (Signed)
Summary: RX  Phone Note Refill Request Call back at Home Phone [phone] Message from:  Patient on April 04, 2010 3:24 PM  Refills Requested: Medication #1:  HYDROCHLOROTHIAZIDE 25 MG TABS Take one (1) by mouth daily [facility] [address]  Initial call taken by: Harlon Flor,  April 04, 2010 3:25 PM Caller: Self    Prescriptions: HYDROCHLOROTHIAZIDE 25 MG TABS (HYDROCHLOROTHIAZIDE) Take one (1) by mouth daily  #90 x 6   Entered by:   Lysbeth Galas CMA   Authorized by:   Dossie Arbour MD   Signed by:   Lysbeth Galas CMA on 04/04/2010   Method used:   Electronically to        [facility] [address] (retail)       [address], [address]       [place], [place]  [address]       Ph: [phone]       Fax: [phone]   RxID:   [id number]

## 2010-04-16 ENCOUNTER — Encounter: Payer: Self-pay | Admitting: Cardiovascular Disease

## 2010-04-16 DIAGNOSIS — Z7901 Long term (current) use of anticoagulants: Secondary | ICD-10-CM | POA: Insufficient documentation

## 2010-04-16 DIAGNOSIS — I82409 Acute embolism and thrombosis of unspecified deep veins of unspecified lower extremity: Secondary | ICD-10-CM

## 2010-04-20 ENCOUNTER — Other Ambulatory Visit: Payer: Self-pay | Admitting: Cardiovascular Disease

## 2010-04-20 ENCOUNTER — Ambulatory Visit (INDEPENDENT_AMBULATORY_CARE_PROVIDER_SITE_OTHER): Payer: PRIVATE HEALTH INSURANCE | Admitting: Emergency Medicine

## 2010-04-20 DIAGNOSIS — Z7901 Long term (current) use of anticoagulants: Secondary | ICD-10-CM

## 2010-04-20 DIAGNOSIS — I82409 Acute embolism and thrombosis of unspecified deep veins of unspecified lower extremity: Secondary | ICD-10-CM

## 2010-04-20 DIAGNOSIS — I10 Essential (primary) hypertension: Secondary | ICD-10-CM

## 2010-04-20 MED ORDER — WARFARIN SODIUM 5 MG PO TABS: 5.0000 mg | ORAL_TABLET | ORAL | Status: DC

## 2010-04-20 NOTE — Telephone Encounter (Signed)
PT came into office for Coumadin check.  Needed refills on his Coumadin and Bystolic and had called earlier requesting, but rx had not yet been refilled.  [facility]illed [facility] to [facility] per pt request.  Pt aware.

## 2010-04-20 NOTE — Patient Instructions (Signed)
Take 1.5 tablets today, then resume same dosage 1 tablet daily.  Recheck in 4 weeks.  

## 2010-04-20 NOTE — Telephone Encounter (Signed)
Bystollic 90 day supply [facility] [address]

## 2010-05-18 ENCOUNTER — Encounter: Payer: PRIVATE HEALTH INSURANCE | Admitting: Emergency Medicine

## 2010-06-03 ENCOUNTER — Encounter: Payer: Self-pay | Admitting: Cardiovascular Disease

## 2010-06-03 ENCOUNTER — Ambulatory Visit (INDEPENDENT_AMBULATORY_CARE_PROVIDER_SITE_OTHER): Payer: PRIVATE HEALTH INSURANCE | Admitting: Cardiovascular Disease

## 2010-06-03 DIAGNOSIS — I10 Essential (primary) hypertension: Secondary | ICD-10-CM

## 2010-06-03 DIAGNOSIS — E119 Type 2 diabetes mellitus without complications: Secondary | ICD-10-CM

## 2010-06-03 DIAGNOSIS — I82409 Acute embolism and thrombosis of unspecified deep veins of unspecified lower extremity: Secondary | ICD-10-CM

## 2010-06-03 DIAGNOSIS — E785 Hyperlipidemia, unspecified: Secondary | ICD-10-CM

## 2010-06-03 DIAGNOSIS — R0602 Shortness of breath: Secondary | ICD-10-CM

## 2010-06-03 DIAGNOSIS — R609 Edema, unspecified: Secondary | ICD-10-CM

## 2010-06-03 DIAGNOSIS — I25810 Atherosclerosis of coronary artery bypass graft(s) without angina pectoris: Secondary | ICD-10-CM

## 2010-06-03 MED ORDER — ROSUVASTATIN CALCIUM 5 MG PO TABS: 5.0000 mg | ORAL_TABLET | Freq: Every day | ORAL | Status: DC

## 2010-06-03 NOTE — Assessment & Plan Note (Signed)
He is on long-term anticoagulation for history of DVT.

## 2010-06-03 NOTE — Patient Instructions (Signed)
You are doing well. No medication changes were made. Restart crestor 5 mg daily  Please call us if you have new issues that need to be addressed before your next appt.  We will call you for a follow up Appt. In 6 months

## 2010-06-03 NOTE — Assessment & Plan Note (Signed)
Currently with no symptoms of angina. No further workup at this time. Continue current medication regimen. 

## 2010-06-03 NOTE — Assessment & Plan Note (Signed)
Edema is likely secondary to venous insufficiency as well as mild fluid overload. He eats [employer]ilda [employer] calendar and frozen meals daily.

## 2010-06-03 NOTE — Progress Notes (Signed)
   Patient ID: Benjamin Davidson, male    DOB: 07/17/1950, 60 y.o.   MRN: [id number]  HPI Comments: Benjamin Davidson is a 60 year old gentleman with a history of coronary artery disease, bypass surgery March 2008, diabetes, obesity, history of DVT in the left leg, he is on chronic warfarin who presents for routine followup.   Benjamin Davidson states overall he is doing well. He does not exercise to any significant degree. He spends most of his free time watching TV and playing on the computer to late at night. He sleeps till late into the morning, typically till noon. He receives most of his medications at a discounted rate and states that they're not very expensive to him. Overall he has no new complaints.   he has not been taking Crestor as it caused muscle ache. He also has leg edema. He has muscle ache with Crestor 10 mg daily though he believes  he can tolerate Crestor 5 mg. He has not been taking Crestor on a regular basis. He reports having blood work done by Benjamin Davidson earlier in the year in February that showed a total cholesterol greater than 300, glucose in the 200 range.  last stress test July 2008 showing mild ischemia in the mid anteroseptal, apical anterior and apical septal region. Ejection fraction 40%. This test was done post bypass. No cardiac catheterization was done in followup as he had no symptoms.   EKG shows normal sinus rhythm with rate of 65 beats per minute, left bundle branch block.      Review of Systems  Constitutional: Positive for fatigue.  HENT: Negative.   Eyes: Negative.   Respiratory: Negative.   Cardiovascular: Negative.   Gastrointestinal: Negative.   Musculoskeletal: Negative.   Skin: Negative.   Neurological: Negative.   Hematological: Negative.   Psychiatric/Behavioral: Negative.   [all other systems reviewed and are negative    BP 107/71  Pulse 67  Ht 5\' 10"  (1.778 m)  Wt 252 lb (114.306 kg)  BMI 36.16 kg/m2  Physical Exam  [nursing  notereviewed. Constitutional: He is oriented to person, place, and time. He appears well-developed and well-nourished.       obese  HENT:  Head: Normocephalic.  Nose: Nose normal.  Mouth/Throat: Oropharynx is clear and moist.  Eyes: Conjunctivae are normal. Pupils are equal, round, and reactive to light.  Neck: Normal range of motion. Neck supple. No JVD present.  Cardiovascular: Normal rate, regular rhythm, S1 normal, S2 normal, normal heart sounds and intact distal pulses.  Exam reveals no gallop and no friction rub.   No murmur heard.      Edema is trace around the ankles  Pulmonary/Chest: Effort normal and breath sounds normal. No respiratory distress. He has no wheezes. He has no rales. He exhibits no tenderness.  Abdominal: Soft. Bowel sounds are normal. He exhibits no distension. There is no tenderness.  Musculoskeletal: Normal range of motion. He exhibits edema. He exhibits no tenderness.  Lymphadenopathy:    He has no cervical adenopathy.  Neurological: He is alert and oriented to person, place, and time. Coordination normal.  Skin: Skin is warm and dry. No rash noted. No erythema.  Psychiatric: He has a normal mood and affect. His behavior is normal. Judgment and thought content normal.           Assessment and Plan

## 2010-06-03 NOTE — Assessment & Plan Note (Signed)
Shortness of breath and fatigue is likely secondary from being overweight and deconditioned. No further workup ordered at this time.

## 2010-06-03 NOTE — Assessment & Plan Note (Signed)
We have encouraged continued exercise, careful diet management in an effort to lose weight. 

## 2010-06-03 NOTE — Assessment & Plan Note (Signed)
We have encouraged him to take Crestor 5 mg daily. We have suggested we recheck his cholesterol in 2 months time. Medication compliance was recommended.

## 2010-06-03 NOTE — Assessment & Plan Note (Signed)
Blood pressure is well controlled on today's visit. No changes made to the medications. 

## 2010-06-08 ENCOUNTER — Ambulatory Visit (INDEPENDENT_AMBULATORY_CARE_PROVIDER_SITE_OTHER): Payer: PRIVATE HEALTH INSURANCE | Admitting: Emergency Medicine

## 2010-06-08 DIAGNOSIS — Z7901 Long term (current) use of anticoagulants: Secondary | ICD-10-CM

## 2010-06-08 DIAGNOSIS — I82409 Acute embolism and thrombosis of unspecified deep veins of unspecified lower extremity: Secondary | ICD-10-CM

## 2010-06-14 NOTE — Op Note (Signed)
NAMEDEWEY, VIENS               ACCOUNT NO.:  [id number]   MEDICAL RECORD NO.:  [id number]          PATIENT TYPE:  OIB   LOCATION:  2550                         FACILITY:  [facility]   PHYSICIAN:  Nadara Mustard, MD     DATE OF BIRTH:  1950/11/21   DATE OF PROCEDURE:  01/02/2007  DATE OF DISCHARGE:                               OPERATIVE REPORT   PREOPERATIVE DIAGNOSIS:  Osteomyelitis, left foot fifth metatarsal, with  Wagoner grade III ulceration.   POSTOPERATIVE DIAGNOSIS:  Osteomyelitis, left foot fifth metatarsal,  with Wagoner grade III ulceration.   PROCEDURE:  Left foot fifth ray amputation.   SURGEON:  Nadara Mustard, MD   ANESTHESIA:  General.   ESTIMATED BLOOD LOSS:  Minimal.   ANTIBIOTICS:  1 g of Kefzol.   DRAINS:  None.   COMPLICATIONS:  None.   TOURNIQUET TIME:  Esmarch at the ankle for approximately 15 minutes.   DISPOSITION:  To PACU in stable condition.   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  The patient has failed conservative care with pressure  unloading, antibiotics, and has the osteomyelitis confirmed by an MRI  scan.  Due to the chronic osteomyelitis, draining ulceration, the  patient presents at this time for a fifth ray amputation.  Risks and  benefits were discussed including infection, neurovascular injury,  persistent pain, nonhealing of the wound, need for a higher-level  amputation.  The patient states he understands and wishes to proceed at  this time.   DESCRIPTION OF PROCEDURE:  The patient was brought to OR room #5 and  underwent a general anesthetic.  After adequate level of anesthesia  obtained, the patient's right lower extremity was prepped using DuraPrep  and draped into a sterile field.  A racquet incision was made around the  fifth toe and including the ulcer and sinus tract, and this along with  the toe and the metatarsal were  resected in one block of tissue.  The  metatarsal was ankle with the bevel on the plantar aspect to provide a  smooth plantar surface.  The base was left attached to the peroneal  brevis.  The wound was irrigated with normal saline.  Hemostasis was  obtained with electrocautery.  After irrigation and hemostasis was  obtained, the tourniquet was deflated and hemostasis was confirmed.  The  2-0 nylon was used to close the wound.  There was no tension on the  skin.  The skin edges approximated well.  The wound was covered with  Adaptic orthopedic sponges, sterile Webril, Kerlix and a Coban dressing.  The patient was extubated, taken to  the PACU in stable condition.  The patient received 20 mL of 0.5%  Marcaine plain for a local anesthetic.  The patient did not want an  ankle block prior to surgery.  Planned for 23-hour observation,  discharge to home in the morning.      Nadara Mustard, MD  Electronically Signed     MVD/MEDQ  D:  01/02/2007  T:  01/02/2007  Job:  [id number]

## 2010-06-14 NOTE — Consult Note (Signed)
[name], Davidson               ACCOUNT NO.:  [id number]   MEDICAL RECORD NO.:  [id number]          PATIENT TYPE:  REC   LOCATION:  FOOT                         FACILITY:  [facility]   PHYSICIAN:  Jake Shark A. Tanda Rockers, M.D.DATE OF BIRTH:  July 03, 1950   DATE OF CONSULTATION:  11/29/2006  DATE OF DISCHARGE:                                 CONSULTATION   SUBJECTIVE:  [name] is a 60 year old man referred by Dr. Doug Sou from the [facility] ER for evaluation of deep infection of the left  foot.   IMPRESSION:  A Wagner grade 3 diabetic foot ulcer, possibly Wagner grade  4.   PLAN:  We have started the patient on Septra DS one p.o. b.i.d. as well  as doxycycline 100 mg p.o. b.i.d.  We will put him in a open healing  sandal with a bulky dressing and reevaluate him in 72 hours.  In the  interim, we will proceed with an MRI to rule out a deep-seated  osteomyelitis.  We will repeat his chest x-ray.  Cultures have been  taken.  We will also obtain a CBC, sedimentation rate, a complete  metabolic profile including a hemoglobin A1c.   SUBJECTIVE:  [name] is a 60 year old man who has had discomfort in  the lateral left foot for approximately 6-8 weeks.  He has been seen by  multiple providers including his primary care physician at [facility].  He has been treated with a course of antibiotics with some variable  improvement.  He has not had temperature spikes or chills.  There is  been no malodorous drainage.   PAST MEDICAL HISTORY:  Is remarkable for hypercholesteremia, diabetes,  and a history of deep venous thrombosis with pulmonary emboli.  He also  has a history of coronary artery disease.  He denies specific allergies.   CURRENT MEDICATION LIST:  Includes:  1. Zoloft 100 mg daily.  2. Glucovance 5/500.  3. Warfarin 10 mg daily.  4. Bystolic 10 mg daily.  5. Micardis 80 mg a day.  6. Aspirin 325 mg daily.  7. Simvastatin 20 mg h.s.  8. Lortab 5/500.  9. Avandia 4 mg daily.  10.Cipro 500 mg which was recently discontinued.   PREVIOUS SURGERIES:  Included a coronary artery bypass in the last year.   FAMILY HISTORY:  Positive for coronary disease, stroke and high blood  pressure, plus diabetes.   SOCIALLY:  He is divorced.  He has no children.  He lives in [place].  He is disabled, currently pursuing social services assistance.   REVIEW OF SYSTEMS:  He does not smoke.  He has poor exercise tolerance  related to shortness of breath.  He takes Coumadin daily with his  Coumadin being regulated by Dr. Hyacinth Meeker at [facility].  He denies visual  changes or transient paralysis.  He does have episodic shortness of  breath with palpitations.  His weight has increased approximately 10  pounds over the last year.  He denies bowel or bladder dysfunction.  He  denies syncope, polydipsia or polyuria.  The remainder of the review of  systems  is negative.   PHYSICAL EXAMINATION:  He is an alert, oriented man in no acute  distress.  He has a flat affect.  Blood pressure is 147/83, respirations  18, pulse rate 65, temperature is 98.4.  His capillary blood glucose is  indeterminate.  By history, his last blood sugar was over 300.  He does  not recall the last time specifically that he took his blood sugar.  HEENT EXAM:  Clear.  NECK:  Supple.  Trachea is midline.  Thyroid is nonpalpable.  LUNGS:  Clear.  The sternotomy incision is well-healed.  HEART SOUNDS:  Distant.  ABDOMEN:  Soft.  EXTREMITY EXAM:  Remarkable for bilateral 1-2+ edema.  Over the left  lateral foot there is intense erythema with no particular tenderness on  palpation.  On the lateral aspect of the head of the fifth metatarsal  phalangeal joint is a sinus that extends down to the bone.  This wound  was probed with a Q-tip and a culture was taken.  There is no malodor.  There is no frank purulence.  The pedal pulses are 3+ and readily  palpable.  Neurologically, the patient is insensate to the  Semmes-  Weinstein filament.   DISCUSSION:  This 60 year old insulin-dependent diabetic has at minimum  a Wagner 3 diabetic foot ulcer and at the maximum a Wagner 4.  There is  no evidence of toxicity at this point.  The chronicity of this wound  suggests that it may be related to occult trauma and hemorrhage  attendant with his Coumadin therapy.  We will start him on an empiric  antibiotic to guard against methicillin-resistant Staphylococcus.  We  will proceed with the contrast studies as above to determine the extent  of his local inflammatory process.  We will reevaluate him in 72-96  hours, or sooner if he has increased swelling, drainage, fever or  chills.  If there is no significant improvement or there is  deterioration, we will refer this patient to orthopedics for possible  open debridement.  Once open drainage is achieved, this patient may be a  candidate for hyperbaric oxygen treatment to effect primary closure.   We have explained this approach, these therapeutic objectives to the  patient in terms that he seems to understand.  He expresses gratitude  for having been seen in the clinic and indicates that he will be  compliant.      Harold A. Tanda Rockers, M.D.  Electronically Signed     HAN/MEDQ  D:  11/29/2006  T:  11/30/2006  Job:  [id number]   cc:   Doug Sou, M.D.  Maurice March, M.D.

## 2010-06-14 NOTE — Assessment & Plan Note (Signed)
[facility]   NAME:  Benjamin Davidson, Benjamin Davidson               ACCOUNT NO.:  [id number]   MEDICAL RECORD NO.:  [id number]      DATE OF BIRTH:  1950/12/08   PHYSICIAN:  Theresia Majors. Tanda Rockers, M.D. VISIT DATE:  12/03/2006                                   OFFICE VISIT   SUBJECTIVE:  Benjamin Davidson is a 60 year old man who we initially saw on  November 29, 2006, with an open wound of the lateral left foot with a  suspicion of osteomyelitis.  The wound was cultured and the patient was  started on Septra and doxycycline.  In the interim he has had no fever.  He continues to be ambulatory.  He has had an interim MRI and laboratory  assessment.   OBJECTIVE:  Blood pressure is 141/78, respirations are 20, pulse rate  70, temperature is 98.4.  Inspection of the left lower extremity shows a persistence of edema but  the erythema has markedly decreased.  There is scant drainage.  There is  no tenderness.  The pedal pulse remains faintly palpable with brisk  capillary fill.  The wound itself on the dorsal lateral aspect of the  foot is punctate with a serous purulent drainage, which was again  cultured.   Review of the MRI gives an impression of osteomyelitis of the distal 3/4  of the fifth metatarsal and the base of the proximal phalanx.  There is  also a questionable fracture.  Chest x-ray is clear.  BUN is 24.  Capillary blood glucose was 261 mg%.  Hematocrit is 35, hemoglobin is  12.   ASSESSMENT:  Probable osteomyelitis.   PLAN:  We are referring the patient to the orthopedic surgeon for the  possibility of drainage and radical debridement.  We have explained this  approach to the patient in terms that he seems to understand.  He is  agreeable to proceed with the consultation.  At this point his pain is  well-controlled.  There has been no evidence of ascension.  We will  attempt to expedite his evaluation by the orthopedist to avoid major  complication.  We have given the patient the  opportunity to ask  questions.  He is aware of the potential for limb loss.  We have  encouraged him to watch his blood sugars and to be compliant with all  previous instructions given by his medical doctors.  We will discharge  him from active care in the wound center pending his evaluation and  management by the orthopedist.  We will be happy to readmit him to the  hospital for treatment following debridement if that is deemed necessary  and performed.  At any rate, we have encouraged the patient to contact  our office once that consultation is made and we will be happy to stay  involved in his care.  The patient seems to understand and expresses  gratitude for having been seen in the clinic, indicates that he will be  compliant.      Harold A. Tanda Rockers, M.D.  Electronically Signed     HAN/MEDQ  D:  12/03/2006  T:  12/04/2006  Job:  [id number]

## 2010-06-17 NOTE — Discharge Summary (Signed)
NAMEJUSTIN, Benjamin Davidson               ACCOUNT NO.:  [id number]   MEDICAL RECORD NO.:  [id number]          PATIENT TYPE:  INP   LOCATION:  3735                         FACILITY:  [facility]   PHYSICIAN:  Abelino Derrick, P.A.   DATE OF BIRTH:  [birth year]   DATE OF ADMISSION:  09/04/2005  DATE OF DISCHARGE:  09/10/2005                                 DISCHARGE SUMMARY   DISCHARGE DIAGNOSES:  1. Pulmonary embolism this admission.  2. Coumadin therapy secondary to #1.  3. Abnormal Cardiolite study this admission with suspected coronary      disease to be treated medically at this time.  4. Non-insulin-dependent diabetes.  5. Hypertension.  6. Dyslipidemia.  7. Depression. The patient was seen by Dr. Jeanie Sewer this admission.  8. Obesity.   HOSPITAL COURSE:  The patient is [name]. He has a prior history of a DVT and was on Coumadin for about a  year and half. He presented to the emergency room after seeing  Dr. Vear Clock on September 04, 2005 with dyspnea and chest tightness. He was seen  by Dr. Jenne Campus on admission. The patient had been noncompliant with his  medications because of some financial issues. He also has a history of  depression. He needs generic medications to help with compliance wherever  possible. The patient did have a D-dimer that was elevated, and he was  started on IV heparin. Cardiolite study was obtained. This revealed no  ischemia but severe hypokinesis of the septum with multiple fixed perfusion  defects with an EF of 40%. The patient was seen in consult by Dr. Jeanie Sewer  who felt he had major depressive disorder and was started on Zoloft. He did  have  Dopplers of the lower extremities which were positive for left DVT in  the popliteal vein. The patient was continued on heparin, Coumadin. His INR  goal will be 2.5 to 3 because of multiple pulmonary emboli seen on CT scan.  Echocardiogram done this admission showed overall LV function  was normal  with an EF of 60%. RV size was normal. RV systolic function was normal. We  feel the patient can be discharged on September 10, 2005. He will have a  protime Wednesday in our office.   DISCHARGE MEDICATIONS:  1. Coumadin 5 mg, 1-1/2 tablets on Monday, Wednesday, Thursday, Saturday,      and Sunday with 10 mg on Tuesday and Friday.  2. Verapamil 240 mg a day.  3. Simvastatin 20 mg a day.  4. Metoprolol 50 mg 1/2 tablet twice a day.  5. Lisinopril 20 mg twice a day.  6. Aspirin 81 mg a day.  7. Isosorbide mononitrate 30 mg a day.  8. Sertraline 50 mg q.h.s.  9. Glucovance 5/100, two tablets twice a day.   LABORATORY DATA:  White count 9.1, hemoglobin 11.9, hematocrit 35.7,  platelets 339. Sodium 139, potassium 3.9, BUN 10, creatinine 0.8, glucose  232. Troponins are negative. Cholesterol was 224 with an LDL 166. D-dimer  7.05. Hemoglobin A1c was 10.3. TSH 2.19. BNP 42.   CT  of the chest showed extensive bilateral acute pulmonary emboli. Chest x-  ray showed no acute disease.   INR at discharge is 2.1. We did give him a dose of Lovenox at discharge.   DISPOSITION:  The patient discharged in stable condition. He will follow up  with Dr. Jenne Campus in the office. He will have a protime Wednesday. His INR  goal will be 2.5 to 3.      Abelino Derrick, P.ALenard Lance  D:  09/10/2005  T:  09/11/2005  Job:  [id number]   cc:   [name]

## 2010-06-17 NOTE — Consult Note (Signed)
NAMEBLAKELEY, MARGRAF NO.:  [id number]   MEDICAL RECORD NO.:  [id number]          PATIENT TYPE:  INP   LOCATION:  4711                         FACILITY:  [facility]   PHYSICIAN:  Kerin Perna, M.D.  DATE OF BIRTH:  Dec 06, 1950   DATE OF CONSULTATION:  03/26/2006  DATE OF DISCHARGE:                                 CONSULTATION   PHYSICIAN REQUESTING CONSULTATION:  Lenise Herald, M.D.   PRIMARY CARE PHYSICIAN:  In [place].   REASON FOR CONSULTATION:  Severe coronary artery disease with Class III  progressive angina.   CHIEF COMPLAINT:  Dyspnea with exertion.   HISTORY OF PRESENT ILLNESS:  I was asked to evaluate this 60 year old  obese white male diabetic with recently diagnosed severe coronary artery  disease for potential surgical coronary revascularization.  The patient  has a strong family history of coronary disease and several siblings  have had bypass surgery.  He has had progressive dyspnea on exertion  over the past few weeks.  Of importance, he sustained bilateral  pulmonary emboli in August of 2007 and has been on Coumadin since that  time.  A stress test was performed recently which showed no active  ischemia with an EF of 40%.  A cardiac CT scan was performed earlier  this month that showed heavy calcification of the proximal LAD with  proximal high grade stenosis and for that reason he was converted from  Coumadin to Lovenox and admitted for elective cardiac catheterization  today.  This demonstrated an ostial 95% stenosis of the LAD, a 90%  stenosis of the ramus intermedius and a 40% stenosis of a diagonal  branch at LAD.  His EF was 55% and left ventricular end-diastolic  pressure measured at 10 mmHg.  Because of his severe ostial LAD disease  he was felt to be a candidate for surgical coronary revascularization  instead of percutaneous therapy.  He is currently stable on  nitroglycerin and Lovenox therapy.   PAST MEDICAL HISTORY:  1. Type  2 diabetes mellitus.  2. Hypertension.  3. Obesity.  4. Hyperlipidemia.  5. History of deep vein thrombosis in the left leg with bilateral      pulmonary emboli August of 2007, treated with Coumadin since that      time.  6. Depression.   ALLERGIES:  NONE.   CURRENT MEDICATIONS:  1. Glucovance.  2. Toprol-XL 25 b.i.d.  3. Coumadin 5, alternating with 7.5 mg.  4. Aspirin 81 mg.  5. Sertraline 50 mg q.h.s. (Zoloft).  6. Simvastatin 20 mg daily.  7. Imdur 30 mg daily.  8. Avandia 4 mg daily.  9. Micardis 80/12.5 mg daily.  10.Clonidine 0.1 b.i.d.   SOCIAL HISTORY:  The patient is single.  He is not currently employed.  He has worked in the past at [employer].  He does not smoke and he  does not drink alcohol.   FAMILY HISTORY:  Strongly positive for coronary disease.   REVIEW OF SYSTEMS:  His current weight is 255 pounds, down from 265 last  month.  He denies any upper respiratory infections recently.  He did  have a right upper tooth extracted within the past 3 months and was on  antibiotics for that briefly.  He denies any history of thoracic trauma  or lower extremity trauma.  He states his blood sugar has been poorly  controlled.  He states he has had no blood per rectum or history of  ulcer disease or hepatitis, jaundice.  He denies BPH or kidney stones or  hematuria.  He has diabetes but he denies thyroid disease.  He denies  TIA or claudication, positive for DVT of the left leg.   PHYSICAL EXAM:  The patient is 5 feet 8 inches, weighs 255 pounds, blood  pressure 150/70, pulse 80, in sinus, respirations 18, saturation 97% on  room air.  GENERAL APPEARANCE:  A middle-aged, overweight white male in his  hospital room following cardiac cath, in no distress.  He is accompanied  by a brother and a sister.  HEENT:  Normocephalic, dentition adequate.  NECK:  Supple without JVD, mass or carotid bruit.  LYMPHATICS:  No palpable supraclavicular or cervical adenopathy.   THORAX:  Without deformity.  BREATH SOUNDS:  Clear and equal.  CARDIAC:  Regular rhythm without S3 gallop or murmur or rub.  ABDOMINAL:  Obese, soft, nontender without organomegaly or pulsatile  mass.  EXTREMITIES:  No cyanosis, clubbing or edema.  Peripheral pulses are 2+  in all extremities.  SKIN:  Without ulcers or rash.  NEUROLOGIC:  Alert and oriented, without focal motor deficit.  His  affect appears to be appropriate.  He has no focal motor deficit.   LABORATORY DATA:  I reviewed the coronary arteriograms and agreed with  Dr. Mikey Bussing impression of severe disease of the LAD and ramus systems  (two-vessel disease) with fairly well preserved LV function.   PLAN:  Patient would a candidate for mammary artery graft to the LAD and  potential vein grafts to the ramus intermedius and diagonal branch to  the LAD.  We will convert his Lovenox therapy to IV heparin prior to  surgery and schedule surgery within approximately 48 hours.  Followup  BUN and creatinine is ordered for the morning.   Thank you for the consultation.      Kerin Perna, M.D.  Electronically Signed     PV/MEDQ  D:  03/26/2006  T:  03/26/2006  Job:  [id number]

## 2010-06-17 NOTE — Discharge Summary (Signed)
NAME:  Benjamin Davidson, Benjamin Davidson NO.:  [id number]   MEDICAL RECORD NO.:  [id number]          PATIENT TYPE:  IPS   LOCATION:  0407                          FACILITY:  [facility]   PHYSICIAN:  Geoffery Lyons, M.D.      DATE OF BIRTH:  1950/10/08   DATE OF ADMISSION:  09/10/2005  DATE OF DISCHARGE:  09/13/2005                                 DISCHARGE SUMMARY   CHIEF COMPLAINT AND PRESENT ILLNESS:  This was the first admission to [facility] for this 60 year old white male, single, voluntarily  admitted.  Transferred from the inpatient medicine unit at [facility]  Being diagnosed with bilateral pulmonary emboli.  Anticoagulated, medically  stabilized.  Referred for having some suicidal thoughts which includes at  one point he was expressing the idea that he may want to cut his wrist.  Was  seen by the consulting psychiatrist.  He did endorse two weeks of increased  irritability.  Had been arguing quite a bit with his sister.  Also endorsed  anhedonia, low energy, depressed mood, intermittent suicidal thoughts.  Significant history of financial stressors.  He has limited education, a lot  of financial difficulties, unable to work, let go from his last job.   PAST PSYCHIATRIC HISTORY:  First time at [facility].  No prior  inpatient treatment.  Has used Zoloft in the past successfully.   ALCOHOL/DRUG HISTORY:  Denies and there is no evidence of alcohol or drug  abuse.   MEDICAL HISTORY:  Diabetes mellitus, type 2, bilateral pulmonary emboli.   MEDICATIONS:  Coumadin 5 mg, 1.5 tablets on Monday, Wednesday, Thursday,  Saturday and Sunday, 2 tablets on Tuesday and Friday, verapamil 240 mg per  day, simvastatin 20 mg once a day, metoprolol 50 mg, 1-1/2 twice a day,  lisinopril 20 mg twice a day, aspirin 81 mg daily, isosorbide 30 mg per day  and sertraline 50 mg at bedtime.   PHYSICAL EXAMINATION:  Performed and failed to show any acute findings.   LABORATORY  DATA:  Glucose 232.  Electrolytes within normal limits.   MENTAL STATUS EXAM:  Alert, cooperative male.  Quite blunted affect.  Somewhat irritable.  Speech was decreased in amount but is relevant, fluent.  Mood was depressed, discouraged about his future, being unable to work,  thinks he needs to get on disability, ashamed of the fact that he was let go  from his previous job.  Thinking is concrete, logical, coherent.  No signs  of psychosis.  Positive suicidal thoughts.  No clear plans the day of this  evaluation.  Cognition was well-preserved.   ADMISSION DIAGNOSES:  AXIS I:  Rule out major depression, recurrent.  AXIS II:  No diagnosis.  AXIS III:  Bilateral pulmonary emboli, diabetes mellitus type 2.  AXIS IV:  Moderate.  AXIS V:  GAF upon admission 30; highest GAF in the last year 68.   HOSPITAL COURSE:  He was admitted.  He was started in individual and group  psychotherapy.  He was maintained on his medication.  He was started on  Wellbutrin XL 150  mg per day and was kept on the Zoloft 50 mg per day.  Endorsed he has been depressed, financial difficulties, unable to hold a  job, multiple medical problems, conflict with her sister.  Endorsed he said  he was going to kill himself.  The family is helping financially but afraid  that they are not going to be as supportive.  Was on Wellbutrin.  Endorsed  it was effective.  He later went off it with no particular reason.  Recently  placed on Zoloft, willing to give Zoloft a try.  Willing to combine it with  Wellbutrin.  As the hospitalization progressed, objectively he got better.  By the 14th, he was uncertain what was going to happen with him.  Upset with  himself for what he did before he came into the hospital.  Endorsed he was  feeling better.  He had a family session with his sisters.  They were  supportive.  He was committed to continue treatment.  He was going to come  to our IOP program.  On September 13, 2005, he was in full  contact with  reality.  He denied any active suicidal or homicidal ideation.  No  hallucinations.  No delusions.  Endorsed he was feeling better, encouraged,  committed to take his medications so we went ahead and discharged to  outpatient follow-up.   DISCHARGE DIAGNOSES:  AXIS I:  Major depression, recurrent.  AXIS II:  No diagnosis.  AXIS III:  Bilateral pulmonary emboli, diabetes mellitus type 2.  AXIS IV:  Moderate.  AXIS V:  GAF upon discharge 50-55.   DISCHARGE MEDICATIONS:  1. Verapamil 240 mg per day.  2. Zocor 20 mg per day.  3. Lopressor 25 mg per day.  4. Prinivil 20 mg every 12 hours.  5. Imdur 30 mg per day.  6. Glucotrol 10 mg twice a day.  7. Glucophage 500 mg, 2 twice a day.  8. Wellbutrin XL 150 mg daily.  9. Zoloft 50 mg per day.  10.Coumadin 6 mg at 6 p.m.   FOLLOWUP:  To follow up with Dr. Jenne Campus.  Follow up with [facility].      Geoffery Lyons, M.D.  Electronically Signed     IL/MEDQ  D:  10/20/2005  T:  10/21/2005  Job:  [id number]

## 2010-06-17 NOTE — Op Note (Signed)
NAMELOGUN, COLAVITO NO.:  [id number]   MEDICAL RECORD NO.:  [id number]          PATIENT TYPE:  INP   LOCATION:  2314                         FACILITY:  [facility]   PHYSICIAN:  Kerin Perna, M.D.  DATE OF BIRTH:  1950/11/03   DATE OF PROCEDURE:  03/23/2006  DATE OF DISCHARGE:                               OPERATIVE REPORT   OPERATION:  Coronary artery bypass grafting x2 (left internal mammary  artery to left anterior descending, saphenous vein graft to ramus  intermediate).   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   POSTOPERATIVE DIAGNOSIS:  Class IV unstable angina with severe two  vessel coronary disease.   SURGEON:  Kerin Perna, M.D.   ASSISTANT:  Jerold Coombe, P.A.-C.   ANESTHESIA:  General by Dr. Hart Robinsons.   INDICATIONS:  The patient is a 60 year old obese male diabetic who  presented with dyspnea on exertion and chest tightness.  His cardiac  enzymes were negative and he underwent cardiac catheterization by Dr.  Lenise Herald.  This demonstrated a high grade 95% ostial LAD stenosis.  The ramus intermediate had an 80% stenosis.  The circumflex proper  vessel had no significant disease and the right coronary was a small  nondominant vessel.  Because of the patient's symptoms and inability to  treat the lesion with percutaneous intervention, surgical  revascularization was felt to be indicated.   Prior to surgery, I examined the patient in his hospital room and  reviewed the results of cardiac cath with the patient and family.  I  discussed indications and benefits of coronary bypass surgery for  treatment of his coronary disease as well as the alternatives.  I  reviewed the major aspects of the planned procedure including the choice  of conduit to include internal mammary artery and endoscopically  harvested saphenous vein, the location of the surgical incisions, and  the use  of general anesthesia and cardiopulmonary bypass.  I reviewed  with the patient the risks to him of coronary bypass surgery including  risks of MI, CVA, bleeding, wound infection, stroke, and death.  He  understood these implications for the surgery and agreed to proceed with  the operation under informed consent.   OPERATIVE FINDINGS:  The LAD was intramyocardial.  It was a very small  vessel distally.  The anastomosis was placed in the mid to proximal  septum.  The ramus intermediate was a small 1 mm vessel but graftable.  The vein was harvested endoscopically from the right leg.   DESCRIPTION OF PROCEDURE:  The patient was brought to the operating room  and placed supine on the operating table.  General anesthesia was  induced.  The chest, abdomen, and legs were prepped with Betadine and  draped as a sterile field.  A sternal incision was made as the saphenous  vein was harvested endoscopically from the right leg.  The left internal  mammary artery was harvested as a pedicle graft from its origin at the  subclavian vessels.  It was a good vessel with good flow.  Heparin was  administered and the ACT was documented as being therapeutic.  The  sternal retractor was placed using the deep blades.  Pursestrings were  placed in the ascending aorta and right atrium and the patient was  cannulated and placed on bypass.  The coronaries were identified for  grafting and the mammary artery and vein grafts were prepared for the  distal anastomoses.  Cardioplegia cannulae were placed for both  antegrade aortic and retrograde coronary sinus cardioplegia.  The  patient was cooled to 32 degrees and the aortic crossclamp was applied.  800 mL of cold blood cardioplegia were administered between the  antegrade aortic and retrograde coronary sinus cardioplegia catheters.  There was a good cardioplegic arrest and septal temperature dropped to  less than 14 degrees.   The distal coronary anastomoses were  performed.  The first distal  anastomosis was to the ramus intermediate.  This was a 1 mm vessel with  a proximal 80% stenosis and a reversed saphenous vein was sewn end-to-  side with running 8-0 Prolene.  There was good flow through the graft.  The second distal anastomosis was to the mid LAD.  This was a 1.5 mm  vessel and the left IMA pedicle was brought through an opening created  in the left lateral pericardium and was brought down onto the LAD and  sewn end-to-side with running 8-0 Prolene.  There was excellent flow  through the anastomosis after briefly releasing the pedicle bulldog on  the mammary artery.  The bulldog pedicle was reapplied and the pedicle  was secured to the epicardium with 6-0 Prolene sutures.  Cardioplegia  was redosed.   While the crossclamp was still in place, the vein proximal anastomosis  was constructed using a 4 mm punch and running 7-0 Prolene.  Prior to  tying down the proximal anastomosis, air was vented from the coronaries  with a dose of retrograde warm blood cardioplegia (hot shot).  The  crossclamp was then removed.  The heart resumed a spontaneous rhythm.  Air was aspirated from the vein graft with a 27 gauge needle.  The  cardioplegia catheters were removed.  The proximal and distal  anastomoses were checked and found to be hemostatic.  Temporary pacing  wires were applied.  The patient was rewarmed to 37 degrees.  The lungs  were re-expanded, the ventilator was resumed.  The patient was then  weaned from bypass without difficulty.  Blood pressure and cardiac  output were stable.  Protamine was administered without adverse  reaction.  The cannulae were removed.  The mediastinum was irrigated  with warm antibiotic irrigation.  The leg incision was irrigated and  closed in a standard fashion.  The pericardium was closed superiorly.  Two mediastinal and a left pleural chest tube were placed and brought out through a separate incision.  The sternum  was closed with  interrupted steel wire.  The pectoralis fascia was closed with a running  #1 Vicryl.  The subcutaneous and skin layers were closed with a running  Vicryl.  Total bypass time was 78 minutes with crossclamp time of 45  minutes.      Kerin Perna, M.D.  Electronically Signed     PV/MEDQ  D:  03/28/2006  T:  03/28/2006  Job:  [id number]   cc:   Darlin Priestly, MD

## 2010-06-17 NOTE — Cardiovascular Report (Signed)
Benjamin Davidson, Benjamin Davidson NO.:  [id number]   MEDICAL RECORD NO.:  [id number]          PATIENT TYPE:  INP   LOCATION:  4711                         FACILITY:  [facility]   PHYSICIAN:  Darlin Priestly, MD  DATE OF BIRTH:  05-17-1950   DATE OF PROCEDURE:  DATE OF DISCHARGE:                            CARDIAC CATHETERIZATION   PROCEDURES:  1. Left heart catheterization.  2. Coronary angiography.  3. Left ventriculogram.  4. Abdominal aortogram.   ATTENDING PHYSICIAN:  Lenise Herald   COMPLICATIONS:  None.   INDICATION:  [name] is a 60 year old male patient of Dr. [name] and Dr. Lenise Herald with a history of hypertension,  diabetes, hyperlipidemia, obesity, history of left bundle-branch block,  history of multiple pulmonary emboli in August of 2007 on Coumadin  therapy.  He has complained of intermittent chest pain and shortness of  breath, also underwent outpatient cardiac CT scan revealing heavy  calcification in the proximal LAD with normal LV function.  He is now  brought for a cardiac catheterization to rule out significant LAD  stenosis.   DESCRIPTION OF OPERATION:  After obtaining informed consent, the patient  was brought to the cardiac cath lab where the right and left groin was  shaved, prepped and draped in the usual sterile fashion.  Anesthesia  monitor established.  Using the modified Seldinger technique, a #6-  French intraarterial sheath was inserted into the right femoral artery.  A 6-French diagnostic catheter was used to perform a diagnostic  angiography.   The left main is a large vessel with no evidence of disease.   The LAD is a medium-size vessel which courses to give access to 1  diagonal branch.  The LAD is noted to have heavy calcification in its  ostial and proximal segment with up to 95% in the ostium with proximal  disease.  The remainder of the LAD and the diagonal have no significant  disease.   Anatomy:  The  left coronary artery courses to a small ramus intermedius  which bifurcates in its mid-segment.  This is a small vessel with  diffuse 90% mitral stenosis.   The left circumflex is a large vessel which courses in the AV groove and  gives rise to 1 obtuse marginal branch as well as a PDA and  posterolateral branch and is a dominant vessel.  The AV groove  circumflex has significant disease.   The first OM is a medium-size vessel with a 20% ostial lesion.   The PDA and posterolateral branch have no significant disease.   The right coronary artery is a small nondominant vessel with an 80% mid-  vessel lesion.   The left ventriculogram reveals a preserved EF of 50%.   The abdominal aortogram has no evidence of renal artery stenosis.   Hemodynamics:  Systemic arterial pressure 150/75, LV systemic pressure  146/8, LVEDP of 10.   CONCLUSION:  1. Significant one-vessel coronary artery disease involving proximal      and ostial LAD with heavy calcification.  2. Normal left ventricular systolic function.  3. No evidence of renal artery  stenosis.  4. Systemic hypertension.      Darlin Priestly, MD  Electronically Signed     RHM/MEDQ  D:  03/26/2006  T:  03/26/2006  Job:  [id number]   cc:   [name]

## 2010-06-17 NOTE — Discharge Summary (Signed)
NAME:  Benjamin Davidson, SECKMAN NO.:  [id number]   MEDICAL RECORD NO.:  [id number]          PATIENT TYPE:  REC   LOCATION:  REHS                         FACILITY:  [facility]   PHYSICIAN:  Kerin Perna, M.D.  DATE OF BIRTH:  [birth year]   DATE OF ADMISSION:  04/26/2006  DATE OF DISCHARGE:  05/08/2006                               DISCHARGE SUMMARY   ADDENDUM TO DISCHARGE SUMMARY:  Patient's discharge was held due to a superficial external wound  infection, as well as INR not being therapeutic.  Patient external wound  was noted to have serous sanguinous drainage increased postop day 7.  He  was started on antibiotics.  Patient was continued on IV vancomycin.  Over the next several days, his external wound infection decreased.  There was no erythema.  By postop day 9, April 06, 2006, external wound  had nearly completely resolved.  IV antibiotics were discontinued and  patient was started on p.o. Keflex.  During this time, patient's INR was  able to reach near therapeutic.  By postop day 9, it was 1.9.   Patient was discharged home postop day 9 in stable condition, April 06, 2006.  Please see dictated discharge summary for followup appointments,  as well as discharge instructions.   DISCHARGE MEDICATIONS:  1. Keflex 500 mg t.i.d. x7 days.  2. Glucophage/glyburide 1000/10 mg b.i.d.  3. Clonidine 0.1 mg b.i.d.  4. Lopressor 50 mg b.i.d.  5. Lantus insulin 40 units subcu b.i.d.  6. Zocor 20 mg at night.  7. Niferex 150 mg daily.  8. Zoloft 50 mg at night.  9. Avandia 4 mg daily.  10.Coumadin 5 mg Monday, Wednesday, Friday, Saturday, Sunday and 7.5      mg Tuesday, Thursday.  11.Lasix 40 mg daily x7 days.  12.Potassium chloride 20 mEq daily x7 days.  13.Xopenex inhaler 2 puffs t.i.d.  14.Guaifenesin 600 mg b.i.d.  15.Oxycodone 5 mg 1-2 tabs q.4-6 hours p.r.n. pain.      Theda Belfast, [place]      Kerin Perna, M.D.  Electronically Signed    KMD/MEDQ  D:   05/15/2006  T:  05/15/2006  Job:  [id number]   cc:   Darlin Priestly, MD

## 2010-06-17 NOTE — Consult Note (Signed)
NAMEJALYN, ROSERO NO.:  [id number]   MEDICAL RECORD NO.:  [id number]          PATIENT TYPE:  INP   LOCATION:  3735                         FACILITY:  [facility]   PHYSICIAN:  Antonietta Breach, M.D.  DATE OF BIRTH:  1950/03/30   DATE OF CONSULTATION:  DATE OF DISCHARGE:                                   CONSULTATION   DATE OF VISIT:  September 05, 2005   REQUESTING PHYSICIAN:  Pamella Pert, M.D.   REASON FOR CONSULTATION:  Depression.   HISTORY OF PRESENT ILLNESS:  Mr. Bobie Kistler is a 60 year old single male  admitted to the [facility] on August 6th for chest pain and  shortness of breath.   Mr. Hoogland has been experiencing a number of stresses including an  inability to be re-employed, financial and bill stress as well as new-onset  chest pain and shortness of breath.   Mr. Soward describes several weeks of depressed mood, decreased  concentration, insomnia, anhedonia and intermittent suicidal thoughts.  He  had an increase in his suicidal thinking last week and it has now diminished  to the point of being absent today.  He has no hallucinations or delusions.  He states that his sister lives locally and has been supportive.  She is  hear visiting him in the hospital today.  He has not been using any alcohol  or illegal drugs.  Despite the above symptoms, he has been able to get out  and fill out applications for work.  He went to an interview for a job two  days prior to this admission, however he did not want to call them back  regarding his hire due to his general medical complications.   PAST PSYCHIATRIC HISTORY:  There is no history of mania, no history of  hallucinations or delusions, no history of suicide attempts, no psychiatric  admissions and no history of psychiatric care.   The patient first developed a similar depression approximately ten years  prior to admission.  He was treated with Wellbutrin with questionable  results.   When depression redeveloped a number of years later, his primary  care physician started him on Zoloft, the dosage was never increased above  50 mg.  The patient did notice decreased irritability (which has been a  major symptom for him).  The patient also noticed with Zoloft that his  outlook was better, his mood was better, his interests were improved and his  energy was improved.  He did not have any adverse effects from it.  His  primary care physician had mentioned going up on the dose, however they  never did increase it.  He has no history of other psychiatric medications  and has never had any counseling.  He has no history of panic attacks.   FAMILY PSYCHIATRIC HISTORY:  None known.   SOCIAL HISTORY:  Mr. Hunley presented a learning disability growing up in  grade school and went to a special school.  He did graduate from high  school.   No history of alcohol use, no illegal drugs.  He is  single, has no children.  He lives by himself.  He describes a strong religious faith and some  involvement with his church.  As mentioned above, his sister lives locally  and is a regular social support for him.   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  He describes a repeated pattern of when his  depression comes on, quitting his work.  This has happened approximately  three times.  He has not been violent.   GENERAL MEDICAL PROBLEMS:  Bilateral pulmonary emboli, no history of CAD  known, diabetes mellitus.   MEDICATIONS:  The MAR is reviewed.  Psychotropics include:  1.  Ambien 10 mg q.h.s. p.r.n.  2.  Valium 5 mg before procedures.   ALLERGIES:  No known drug allergies.   LABORATORY DATA:  CBC is remarkable for a slightly decreased hemoglobin at  12.3 and INR is normal at 1.0.  The glucose on the metabolic panel was  elevated, otherwise unremarkable (200).  TSH is within normal limits.   REVIEW OF SYSTEMS:   CONSTITUTIONAL:  Afebrile.  HEAD:  No trauma.  EYES:  No  visual changes.  EARS:  No hearing impairment.  NOSE:  No rhinorrhea.  MOUTH/THROAT:  No sore throat.  NEUROLOGIC:  Unremarkable.  PSYCHIATRIC:  As  above.  CARDIOVASCULAR:  No chest pain currently, no palpitations.  RESPIRATORY:  No cough.  GASTROINTESTINAL:  No nausea, vomiting or diarrhea.  GENITOURINARY:  No dysuria.  SKIN:  Unremarkable.  ENDOCRINE:  Metabolic as  above.  HEMATOLOGIC/LYMPHATIC:  Slight anemia.  MUSCULOSKELETAL:   No  deformities, weaknesses or atrophy.   EXAMINATION:  VITAL SIGNS:  Temperature 98.9, pulse 67, respiration 20,  blood pressure 165/71, O2 saturation on two liters is 97%.   MENTAL STATUS EXAM:  Mr. Bell is an alert middle-age male lying partially  reclined in a supine position in his hospital bed.  He is oriented to all  spheres.  His affect is constricted.  His fund of knowledge and intelligence  are estimated at the average level.  His speech involved normal rate and  prosody.  Thought Process:  Logical, coherent and goal directed.  No  looseness of association.  Thought Content:  No thoughts of harming himself,  no thoughts of harming others, no delusions, no hallucinations.  Memory is  intact to immediate, recent and remote.  Concentration is mildly decreased.  Affect is constricted.  Mood is depressed.  Judgment is intact.  Insight is  fair to good.   ASSESSMENT:  AXIS I:  1.  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  2.  Rule out major  depressive disorder recurrent, severe 296.33.  AXIS II:  Deferred.  AXIS III:  See general medical problems.  AXIS IX:  General medical, economics, occupational.  AXIS V:  55.   Mr. Eckley is not at risk to harm himself or others.  He agrees to use  emergency services for thoughts of harming himself, thoughts of harming  others or distress.  INFORMED CONSENT:  The indications, alternatives and  adverse effects of the  following were discussed with the patient:  Zoloft for anti-depression.  Patient understands above information and wants to start Zoloft.   RECOMMENDATIONS:  1.  Start Zoloft 50 mg q.a.m. for anti-depression.  If the patient develops      secondary fatigue, would move it to the evening.  Would then titrate the      Zoloft by 25 mg every two days to a dose as tolerated of 150 mg q.a.m.      (this was discussed with the attending cardiologist who concurs with      Zoloft).  2.  The patient is uncomfortable with the group setting; therefore, will ask      the case manager to set up a psychiatric new appointment for medication      management and counseling within the first week of discharge.  This      patient could benefit from ego-supportive psychotherapy as well as      cognitive behavioral therapy.  His Zoloft as mentioned should be      titrated to 150 q.a.m., but even 100 mg q.a.m. will likely show an      improvement above his previous dosage.  3.  Ambien 5 to 10 mg q.h.s. p.r.n. insomnia.  Do not drive if drowsy.  It      is anticipated the Ambien will be discontinued within the first month as      soon as the Zoloft and psychotherapy take effect.      Antonietta Breach, M.D.  Electronically Signed     JW/MEDQ  D:  09/05/2005  T:  09/05/2005  Job:  [id number]

## 2010-06-17 NOTE — Discharge Summary (Signed)
NAMEEDY, BELT NO.:  [id number]   MEDICAL RECORD NO.:  [id number]          PATIENT TYPE:  INP   LOCATION:  2012                         FACILITY:  [facility]   PHYSICIAN:  Kerin Perna, M.D.  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   IN-HOSPITAL DIAGNOSES:  1. Volume overload postoperatively.  2. Acute blood loss anemia postoperatively.   SECONDARY DIAGNOSES:  1. Diabetes mellitus type 2.  2. Hypertension.  3. Obesity.  4. Hyperlipidemia.  5. History of deep vein thrombosis, left leg, with bilateral pulmonary      emboli, August 2007, treated with Coumadin since then.  6. Depression.   IN-HOSPITAL OPERATIONS AND PROCEDURES:  1. Cardiac catheterization with left heart catheterization, coronary      angiography, left ventriculogram, abdominal aortogram.  2. Coronary artery bypass grafting x2 using a left internal mammary      artery to left anterior descending, saphenous vein graft to ramus      intermediate.   HISTORY AND PHYSICAL AND HOSPITAL COURSE:  The patient is a 55-year  obese male diabetic who presented with dyspnea on exertion and chest  tightness.  His cardiac enzymes were negative and he underwent cardiac  catheterization by Dr. Lenise Herald.  This demonstrated a high-grade  95% ostial LAD stenosis.  The ramus intermediate had an 80% stenosis.  The circumflex proper vessel had no significant disease and the right  coronary was a small, nondominant vessel.  Because of the patient's  symptoms and inability to treat his lesion with percutaneous  intervention, surgical revascularization was indicated.  Dr. Donata Clay  was consulted.  Dr. Donata Clay saw and evaluated the patient.  He  discussed with the patient undergoing coronary bypass grafting.  Risks  and  benefits were discussed with the patient.  He acknowledged  understanding and agreed to proceed.  Surgery was scheduled for March 28, 2006.  Prior to undergoing surgery the patient had bilateral  preoperative carotid duplex ultrasound done showing no significant ICA  stenosis.  Also had preop ABIs done showing on the right to be 1.43, on  the left to be 1.18.  Preoperatively the patient had 2-D echocardiogram  done showing mild decreased left ventricular ejection fraction with  apical hypokinesis.  The patient remained stable preoperatively.  For  details of the patient's past medical history and physical exam, please  see dictated history and physical.   The patient was taken to the operating room March 28, 2006, where he  underwent coronary bypass grafting x2 using a left internal mammary  artery to left anterior descending, saphenous vein graft to ramus  intermediate.  The patient tolerated this procedure well and was  transferred to the intensive care unit in stable condition.  Immediately  postoperatively the patient was noted to be hemodynamically stable.  He  was extubated the evening of surgery.  Following extubation the patient  was noted  to be alert and oriented x4.  The patient's initial  postoperative course was unremarkable.  Vitals were stable postop day  #1.  Postop chest x-ray was stable with no signs of pneumothorax or  pleural effusion.  Chest tubes had minimal drainage.  Chest tubes were  discontinued as well as Swan-Ganz catheter.  Postop day #2 the patient  was restarted on Coumadin for history of pulmonary emboli.  He was  transferred up to 2000 postop day #2.  The patient had daily PT/INR labs  drawn.  Coumadin adjusted appropriately.  He was continued on Lovenox  until Coumadin therapeutic.  The patient's vitals were continued to be  monitored on the floor.  They remained stable.  He did develop acute  blood loss anemia with hemoglobin and hematocrit  dropping to 8.9 and  25.9.  The patient was started on p.o. Niferex.  He was asymptomatic.  His hemoglobin and hematocrit was increasing prior to discharge home.  By postop day #5 it was 9.2 and 26.5.  The patient was able to be weaned  off oxygen, saturating greater than 90% on room air.  The patient's  blood sugars were monitored closely due to history of diabetes mellitus.  He was restarted on his home p.o. medications.  Blood sugars continued  to remain elevated and the patient was initiated on Lantus insulin.  CBGs stabilized prior to discharge home.  The patient remained in normal  sinus rhythm postoperatively.  Pulmonary status was stable.  Incisions  were clean, dry and intact and healing well.  He remained afebrile  postoperatively.   Labs postop day #5 showed a white count of 9.8, hemoglobin 9.2,  hematocrit 26.5, platelet count 410.  Sodium of 137, potassium 3.9,  chloride of 104, bicarb of 27, BUN of 11 and creatinine 0.85, glucose of  78.  The patient was also noted to be volume-overload postoperatively.  He was initiated on Lasix.  He was back near his baseline weight prior  to discharge home.   The patient is tentatively ready for discharge home in the next 1-2  days.   FOLLOW-UP APPOINTMENTS:  A follow-up appointment will be arranged with  Dr. Donata Clay for in 3 weeks.  Our office will contact the patient with  this information.  The patient will need to obtain a PA and lateral  chest x-ray one hour prior to this appointment at [facility].  The patient will need to follow up with Dr. Leanne Chang in 2 weeks.  He will need to call to arrange this appointment.  He also need PT/INR  blood work drawn by Dr. Harriett Sine office 2 days post discharge.  Plan Dr.  Jenne Campus to continue following the patient's Coumadin dosing.   ACTIVITY:  The patient was instructed no driving until released to do so, no heavy lifting over 10 pounds.  He is told to ambulate 3-4 times  per  day, progress as tolerated, and continue his breathing exercises.   INCISIONAL CARE:  The patient is told to shower using soap and water to  wash his incisions.  He is to contact the office if he develops any  drainage or opening from any of his incision sites.   The patient was educated on diet to be low-fat, low-salt, , as well  carbohydrate-modified medium-calorie diet.   DISCHARGE MEDICATIONS:  1. Glyburide/Glucophage 10/100 mg b.i.d.  2. Clonidine 0.1 mg b.i.d.  3. Aspirin 325 mg daily.  4. Lopressor 50 mg b.i.d.  5.  Lantus insulin 40 units subcu b.i.d.  6. Zocor 20 mg at night.  7. Niferex 150 mg daily.  8. Zoloft 50 mg at night.  9. Avandia 4 mg daily.  10.Coumadin will be dosed based on the patient's discharge PT/INR      level.  11.Lasix 40 mg daily x7 days.  12.Potassium chloride 20 mEq daily x7 days.  13.Xopenex inhaler two puffs t.i.d.  14.__________  600 mg b.i.d.  15.Oxycodone 5 mg one to two tablets q.4-6h. p.r.n. pain.      Theda Belfast, [place]      Kerin Perna, M.D.  Electronically Signed    KMD/MEDQ  D:  04/03/2006  T:  04/03/2006  Job:  [id number]   cc:   Darlin Priestly, MD

## 2010-06-17 NOTE — Consult Note (Signed)
NAME:  Benjamin Davidson, Benjamin Davidson NO.:  [id number]   MEDICAL RECORD NO.:  [id number]          PATIENT TYPE:  INP   LOCATION:  3735                         FACILITY:  [facility]   PHYSICIAN:  Antonietta Breach, M.D.  DATE OF BIRTH:  Jun 19, 1950   DATE OF CONSULTATION:  09/06/2005  DATE OF DISCHARGE:                                   CONSULTATION   SUBJECTIVE:  [name] continues with the same symptoms of depression he  had yesterday.  He still has poor energy, poor concentration, depressed  mood, lack of interest.  He has no suicidal thoughts.  No thoughts of  harming others.  No delusions, no hallucinations.  He did tolerate his  initial Zoloft well without adverse effects today at 50 mg q.a.m.   He continues to be stressed by the ongoing evaluation of his cardiac status  with pending tests and results.  He does have much support from his sister.   He has been working on accepting help from others and realizes that, at  times, he gets in the way of legitimate help when he should not.   MENTAL STATUS EXAM:  [name] is alert.  He is oriented to all spheres.  His affect is constricted.  Thought process logical, coherent, and goal-  directed.  No looseness of association.  Thought content:  No thoughts of  harming himself.  No thoughts of harming others.  No delusions.  No  hallucinations.  Concentration mildly decreased.  Judgment is intact.  Insight is fairly good.   VITAL SIGNS:  Temperature 98.2, pulse 90, respiration 20, blood pressure  140/90, O2 saturation on room air is 97%.   ASSESSMENT:   AXIS I:  1.  293.83, depressed (functional and general medical factors).  2.  Major depressive disorder recurrent/severe 296.33.   RECOMMENDATIONS:  1.  Continue with the Zoloft trial, currently at 50 mg q.a.m.  Anticipate      after two days of this dosage, will increase by another 25 q.a.m.  Would      then titrate by 25 mg every two to three days to a trial dose of 100 mg     q.a.m.  2.  We will continue to follow the patient while he is in the hospital.  3.  Anticipate the patient entering outpatient treatment involving      psychotropic medication management and      cognitive behavioral therapy as well as ego-supportive psychotherapy.  4.  For acute insomnia, would use Ambien 5 to 10 mg nightly p.r.n. with a      goal of eventually discontinuing as the other therapies become      effective.      Antonietta Breach, M.D.  Electronically Signed     JW/MEDQ  D:  09/06/2005  T:  09/06/2005  Job:  [id number]

## 2010-06-17 NOTE — H&P (Signed)
NAME:  Benjamin Davidson, Benjamin Davidson NO.:  [id number]   MEDICAL RECORD NO.:  [id number]          PATIENT TYPE:  IPS   LOCATION:  0407                          FACILITY:  [facility]   PHYSICIAN:  Geoffery Lyons, M.D.      DATE OF BIRTH:  06/21/1950   DATE OF ADMISSION:  09/10/2005  DATE OF DISCHARGE:                         PSYCHIATRIC ADMISSION ASSESSMENT   IDENTIFYING INFORMATION:  This is a 60 year old white male who is single.  This is a voluntary admission.   HISTORY OF PRESENT ILLNESS:  This patient was transferred from the inpatient  medicine unit at [facility] after being diagnosed with bilateral  pulmonary emboli.  He was anti-coagulated there and medically stabilized.  He was referred for having some suicidal thoughts which included at one  point his expressing the idea that he may want to cut his wrists.  He was  seen by our psychiatrist, Dr. Antonietta Breach, while on the inpatient  medical unit and his psychiatric assessment is noted in the record.  The  patient has endorsed at least 2 weeks of increased irritability and had been  arguing quite a bit with his sister.  He also endorsed anhedonia, low energy  and depressed mood and intermittent suicidal thoughts which he has also  endorsed these symptoms today.  He reports significant history of financial  stressors.  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  Denies any  symptoms of mania, no history of substance abuse.   PAST PSYCHIATRIC HISTORY:  This is the patient's first inpatient psychiatric  admission.  He has no prior history of inpatient treatment.  He has had a  history of outpatient treatment in the past for depression marked by  irritability and anhedonia, with a positive response to Zoloft, also some  questionable history of treatment with Wellbutrin  with unknown response.  No  history of substance abuse, no prior suicide attempts.   SOCIAL HISTORY:  The patient has a very basic education, received special  education classes in school.  He lives alone.  He has 4 sisters, one who  lives locally near him.  The level of support at is unclear.  He is single,  never married, no children, lives in his own apartment, has his own car,  does his own grocery shopping and ADLs.  He reports that he is not on  disability, no source of financial income at this time.   FAMILY HISTORY:  Unavailable.   ALCOHOL AND DRUG HISTORY:  The patient denies any history of substance  abuse.   PAST MEDICAL HISTORY:  The patient is followed by Dr. [name], his  primary medical doctor in [place], [place], and will also be  followed up by Dr. Jenne Campus here in [place], his cardiologist.  Medical  problems are diabetes mellitus type 2 and bilateral pulmonary emboli by  history.   MEDICATIONS:  Are as noted on his discharge instructions from the medicine  unit:  1. Coumadin 5 mg 1.5 tablets on Monday, Wednesday, Thursday and Saturday,      and Sunday.  Two tablets on Tuesday and Friday.  Pharmacy here at [facility]      will manage the coag protocol.  2. Verapamil 240 mg daily.  3. Simvastatin 20 mg once a day.  4. Metoprolol 50 mg 1/2 tablet b.i.d.  5. Lisinopril 20 mg b.i.d.  6. ASA 81 mg once daily.  7. Isosorbide 30 mg daily.  8. Sertraline 50 mg h.s.  9. Glucovance 5/500 2 tabs b.i.d.   POSITIVE PHYSICAL FINDINGS:  The patient's full physical examination was  done on the medicine unit.  Please refer to their record.  Today we find him  to be well-nourished, well-developed, obese white male who is in no acute  distress, 5 feet 9 inches tall, 268 pounds at the time of admission.  Pulse  76, respirations 18, blood pressure 195/98.  INR was 2.4 this morning.  Electrolytes within normal limits.  His random glucose was elevated at 232.    MENTAL STATUS EXAM:  Fully alert male, pleasant, cooperative with quite  blunted affect, a little bit irritable about having to be in the hospital  but he is cooperative, warms up quite a bit with conversation.  Speech is  decreased in amount but is relevant, fluent.  Mood is depressed.  He is very  discouraged about his future, being unable to work, thinks he needs to get  on disability, was ashamed of the fact that he was let go from his previous  job.  Thinking is quite concrete, logical and coherent, no signs of  psychosis.  Positive suicidal thoughts with no clear plan today.  He denies  that he has a plan for suicide today but admits that he had had suicidal  thoughts in the past.  Feels that he can be safe here.  No homicidal  thoughts.  Cognitively he is intact and oriented x3.   ADMISSION DIAGNOSIS:  AXIS I:  Rule out major depression, recurrent, severe.  AXIS II:  Deferred.  AXIS III:  Bilateral pulmonary emboli, diabetes mellitus type 2,  uncontrolled.  AXIS IV:  Deferred.  AXIS V:  Current 30, past year 7.   INITIAL PLAN OF CARE:  Plan is to voluntarily admit the patient with q.15  minute checks in place to alleviate his suicidal thought.  We are going to  go ahead and continue the trial of Zoloft with 50 mg at h.s.  Meanwhile we  have pharmacy managing his coag protocol.  Follow up is established with Dr.  Jenne Campus for his cardiology issues and his DVT when he leaves here.  Meanwhile we will check a pulse oximetry with his vital signs and see if we  can get case management to arrange for a case conference with his family so  we can gauge the level of support and get some more insight into his  stressors at home.   ESTIMATED LENGTH OF STAY:  5 days.      Margaret A. Scott, N.P.      Geoffery Lyons, M.D.  Electronically Signed    MAS/MEDQ  D:  09/11/2005  T:  09/11/2005  Job:  [id number]

## 2010-06-23 ENCOUNTER — Other Ambulatory Visit: Payer: Self-pay

## 2010-06-23 MED ORDER — TELMISARTAN-HCTZ 80-12.5 MG PO TABS: 1.0000 | ORAL_TABLET | Freq: Every day | ORAL | Status: DC

## 2010-07-06 ENCOUNTER — Ambulatory Visit (INDEPENDENT_AMBULATORY_CARE_PROVIDER_SITE_OTHER): Payer: PRIVATE HEALTH INSURANCE | Admitting: Emergency Medicine

## 2010-07-06 DIAGNOSIS — Z7901 Long term (current) use of anticoagulants: Secondary | ICD-10-CM

## 2010-07-06 DIAGNOSIS — I82409 Acute embolism and thrombosis of unspecified deep veins of unspecified lower extremity: Secondary | ICD-10-CM

## 2010-08-10 ENCOUNTER — Ambulatory Visit (INDEPENDENT_AMBULATORY_CARE_PROVIDER_SITE_OTHER): Payer: PRIVATE HEALTH INSURANCE | Admitting: Emergency Medicine

## 2010-08-10 DIAGNOSIS — Z7901 Long term (current) use of anticoagulants: Secondary | ICD-10-CM

## 2010-08-10 DIAGNOSIS — I82409 Acute embolism and thrombosis of unspecified deep veins of unspecified lower extremity: Secondary | ICD-10-CM

## 2010-08-10 LAB — POCT INR: INR: 3.2

## 2010-09-07 ENCOUNTER — Ambulatory Visit (INDEPENDENT_AMBULATORY_CARE_PROVIDER_SITE_OTHER): Payer: PRIVATE HEALTH INSURANCE | Admitting: Emergency Medicine

## 2010-09-07 DIAGNOSIS — Z7901 Long term (current) use of anticoagulants: Secondary | ICD-10-CM

## 2010-09-07 DIAGNOSIS — I82409 Acute embolism and thrombosis of unspecified deep veins of unspecified lower extremity: Secondary | ICD-10-CM

## 2010-09-07 LAB — POCT INR: INR: 2.5

## 2010-09-13 ENCOUNTER — Telehealth: Payer: Self-pay

## 2010-09-13 NOTE — Telephone Encounter (Signed)
Gave samples of bystolic 10 mg take one tablet daily for 3 week supply with crestor 10 mg.

## 2010-10-05 ENCOUNTER — Ambulatory Visit (INDEPENDENT_AMBULATORY_CARE_PROVIDER_SITE_OTHER): Payer: PRIVATE HEALTH INSURANCE | Admitting: Emergency Medicine

## 2010-10-05 DIAGNOSIS — Z7901 Long term (current) use of anticoagulants: Secondary | ICD-10-CM

## 2010-10-05 DIAGNOSIS — I82409 Acute embolism and thrombosis of unspecified deep veins of unspecified lower extremity: Secondary | ICD-10-CM

## 2010-11-02 ENCOUNTER — Ambulatory Visit (INDEPENDENT_AMBULATORY_CARE_PROVIDER_SITE_OTHER): Payer: Medicare Other | Admitting: Emergency Medicine

## 2010-11-02 DIAGNOSIS — Z7901 Long term (current) use of anticoagulants: Secondary | ICD-10-CM

## 2010-11-02 DIAGNOSIS — I82409 Acute embolism and thrombosis of unspecified deep veins of unspecified lower extremity: Secondary | ICD-10-CM

## 2010-11-02 MED ORDER — ROSUVASTATIN CALCIUM 20 MG PO TABS: 20.0000 mg | ORAL_TABLET | Freq: Every day | ORAL | Status: DC

## 2010-11-07 LAB — PROTIME-INR
INR: 2.4 — ABNORMAL HIGH
Prothrombin Time: 26.7 — ABNORMAL HIGH

## 2010-11-07 LAB — CBC
HCT: 32.6 — ABNORMAL LOW
Hemoglobin: 10.9 — ABNORMAL LOW
RBC: 4.16 — ABNORMAL LOW
RDW: 17 — ABNORMAL HIGH
WBC: 7.2

## 2010-11-07 LAB — COMPREHENSIVE METABOLIC PANEL
ALT: 24
Alkaline Phosphatase: 45
BUN: 20
CO2: 25
Chloride: 105
Glucose, Bld: 120 — ABNORMAL HIGH
Potassium: 5.3 — ABNORMAL HIGH
Sodium: 137
Total Bilirubin: 0.6

## 2010-11-09 LAB — I-STAT 8, (EC8 V) (CONVERTED LAB)
BUN: 24 — ABNORMAL HIGH
Chloride: 103
HCT: 35 — ABNORMAL LOW
Hemoglobin: 11.9 — ABNORMAL LOW
Potassium: 5.3 — ABNORMAL HIGH
pCO2, Ven: 52.4 — ABNORMAL HIGH
pH, Ven: 7.318 — ABNORMAL HIGH

## 2010-11-09 LAB — POCT I-STAT CREATININE: Creatinine, Ser: 0.9

## 2010-11-30 ENCOUNTER — Ambulatory Visit (INDEPENDENT_AMBULATORY_CARE_PROVIDER_SITE_OTHER): Payer: Medicare Other | Admitting: Emergency Medicine

## 2010-11-30 DIAGNOSIS — I82409 Acute embolism and thrombosis of unspecified deep veins of unspecified lower extremity: Secondary | ICD-10-CM

## 2010-11-30 DIAGNOSIS — Z7901 Long term (current) use of anticoagulants: Secondary | ICD-10-CM

## 2010-11-30 LAB — POCT INR: INR: 2.3

## 2010-11-30 MED ORDER — ROSUVASTATIN CALCIUM 20 MG PO TABS: 20.0000 mg | ORAL_TABLET | Freq: Every day | ORAL | Status: DC

## 2011-01-11 ENCOUNTER — Ambulatory Visit (INDEPENDENT_AMBULATORY_CARE_PROVIDER_SITE_OTHER): Payer: Medicare Other | Admitting: Emergency Medicine

## 2011-01-11 DIAGNOSIS — Z7901 Long term (current) use of anticoagulants: Secondary | ICD-10-CM

## 2011-01-11 DIAGNOSIS — I82409 Acute embolism and thrombosis of unspecified deep veins of unspecified lower extremity: Secondary | ICD-10-CM

## 2011-01-11 LAB — POCT INR: INR: 2.4

## 2011-01-12 ENCOUNTER — Other Ambulatory Visit: Payer: Self-pay | Admitting: Cardiovascular Disease

## 2011-01-12 NOTE — Telephone Encounter (Signed)
Refill sent for warfarin 5 mg 

## 2011-01-17 ENCOUNTER — Encounter: Payer: Self-pay | Admitting: Cardiovascular Disease

## 2011-01-17 ENCOUNTER — Ambulatory Visit (INDEPENDENT_AMBULATORY_CARE_PROVIDER_SITE_OTHER): Payer: Medicare Other | Admitting: Cardiovascular Disease

## 2011-01-17 DIAGNOSIS — I82409 Acute embolism and thrombosis of unspecified deep veins of unspecified lower extremity: Secondary | ICD-10-CM

## 2011-01-17 DIAGNOSIS — I25810 Atherosclerosis of coronary artery bypass graft(s) without angina pectoris: Secondary | ICD-10-CM

## 2011-01-17 DIAGNOSIS — I10 Essential (primary) hypertension: Secondary | ICD-10-CM

## 2011-01-17 DIAGNOSIS — R609 Edema, unspecified: Secondary | ICD-10-CM

## 2011-01-17 DIAGNOSIS — E119 Type 2 diabetes mellitus without complications: Secondary | ICD-10-CM

## 2011-01-17 DIAGNOSIS — E7800 Pure hypercholesterolemia, unspecified: Secondary | ICD-10-CM

## 2011-01-17 DIAGNOSIS — R0602 Shortness of breath: Secondary | ICD-10-CM

## 2011-01-17 NOTE — Assessment & Plan Note (Signed)
Blood pressure is well controlled on today's visit. No changes made to the medications. 

## 2011-01-17 NOTE — Patient Instructions (Signed)
You are doing well. No medication changes were made.  Please call us if you have new issues that need to be addressed before your next appt.  The office will contact you for a follow up Appt. In 6 months  

## 2011-01-17 NOTE — Assessment & Plan Note (Signed)
High risk of recurrent DVT given sedentary lifestyle.

## 2011-01-17 NOTE — Assessment & Plan Note (Signed)
Currently with no symptoms of angina. No further workup at this time. Continue current medication regimen. 

## 2011-01-17 NOTE — Progress Notes (Signed)
Patient ID: Benjamin Davidson, male    DOB: 1950-11-02, 60 y.o.   MRN: [id number]  HPI Comments: 60 year old gentleman with a history of Obesity, coronary artery disease, bypass surgery March 2008, diabetes, obesity, history of DVT in the left leg, he is on chronic warfarin who presents for routine followup.    [name] states overall he is doing well. He does not exercise to any significant degree. He spends most of his free time watching TV and playing on the computer to late at night. He receives most of his medications at a discounted rate and states that they're not very expensive to him. Overall he has no new complaints.    Takes crestor 20 mg  In the morning with no complaints. He had previous leg cramping when taken at nighttime.  last stress test July 2008 showing mild ischemia in the mid anteroseptal, apical anterior and apical septal region. Ejection fraction 40%. This test was done post bypass. No cardiac catheterization was done in followup as he had no symptoms.    EKG shows normal sinus rhythm with rate of 74 beats per minute, left bundle branch block.     Outpatient Encounter Prescriptions as of 01/17/2011  Medication Sig Dispense Refill  . aspirin EC 325 MG EC tablet Take 325 mg by mouth daily.        . Flaxseed, Linseed, (FLAX SEED OIL) 1000 MG CAPS Take 2 capsules by mouth 2 (two) times daily.        . furosemide (LASIX) 20 MG tablet Take 20 mg by mouth daily. If increased swelling can take an extra 1 tablet daily as needed       . hydrochlorothiazide 25 MG tablet Take 25 mg by mouth daily.        . insulin NPH-insulin regular (NOVOLIN 70/30) (70-30) 100 UNIT/ML injection Inject 35 Units into the skin 2 (two) times daily with a meal.        . Insulin Pen Needle (NOVOFINE 31) 31G X 6 MM MISC by Does not apply route.        . Insulin Pen Needle (NOVOFINE) 32G X 6 MM MISC by Does not apply route.        . loratadine (CLARITIN) 10 MG tablet Take 10 mg by mouth daily as  needed.        . nebivolol (BYSTOLIC) 10 MG tablet Take 1 tablet (10 mg total) by mouth daily.  90 each  3  . rosuvastatin (CRESTOR) 20 MG tablet Take 1 tablet (20 mg total) by mouth at bedtime.  90 tablet  3  . sertraline (ZOLOFT) 100 MG tablet Take 100 mg by mouth daily.        Marland Kitchen telmisartan-hydrochlorothiazide (MICARDIS HCT) 80-12.5 MG per tablet Take 1 tablet by mouth daily.  90 tablet  3  . warfarin (COUMADIN) 5 MG tablet TAKE ONE TABLET BY MOUTH AS DIRECTED  120 tablet  0    Review of Systems  Constitutional: Negative.   HENT: Negative.   Eyes: Negative.   Respiratory: Negative.   Cardiovascular: Negative.   Gastrointestinal: Negative.   Musculoskeletal: Negative.   Skin: Negative.   Neurological: Negative.   Hematological: Negative.   Psychiatric/Behavioral: Negative.   All other systems reviewed and are negative.     BP 128/78  Pulse 74  Ht 5\' 10"  (1.778 m)  Wt 273 lb (123.832 kg)  BMI 39.17 kg/m2  Physical Exam  Nursing note and vitals reviewed. Constitutional: He is oriented  to person, place, and time. He appears well-developed and well-nourished.       obese  HENT:  Head: Normocephalic.  Nose: Nose normal.  Mouth/Throat: Oropharynx is clear and moist.  Eyes: Conjunctivae are normal. Pupils are equal, round, and reactive to light.  Neck: Normal range of motion. Neck supple. No JVD present.  Cardiovascular: Normal rate, regular rhythm, S1 normal, S2 normal, normal heart sounds and intact distal pulses.  Exam reveals no gallop and no friction rub.   No murmur heard. Pulmonary/Chest: Effort normal and breath sounds normal. No respiratory distress. He has no wheezes. He has no rales. He exhibits no tenderness.  Abdominal: Soft. Bowel sounds are normal. He exhibits no distension. There is no tenderness.  Musculoskeletal: Normal range of motion. He exhibits no edema and no tenderness.  Lymphadenopathy:    He has no cervical adenopathy.  Neurological: He is alert  and oriented to person, place, and time. Coordination normal.  Skin: Skin is warm and dry. No rash noted. No erythema.  Psychiatric: He has a normal mood and affect. His behavior is normal. Judgment and thought content normal.           Assessment and Plan

## 2011-01-17 NOTE — Assessment & Plan Note (Signed)
Continue crestor 20 mg daily We do not have lipids to evaluate

## 2011-01-17 NOTE — Assessment & Plan Note (Signed)
We have encouraged continued exercise, careful diet management in an effort to lose weight. 

## 2011-01-17 NOTE — Assessment & Plan Note (Signed)
He does have chronic mild shortness of breath which is likely secondary to his deconditioning and weight.

## 2011-01-17 NOTE — Assessment & Plan Note (Signed)
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

## 2011-01-31 HISTORY — PX: PERIPHERALLY INSERTED CENTRAL CATHETER INSERTION: SHX2221

## 2011-02-14 ENCOUNTER — Other Ambulatory Visit: Payer: Self-pay | Admitting: Cardiovascular Disease

## 2011-02-14 NOTE — Telephone Encounter (Signed)
Refill sent for furosemide  

## 2011-02-22 ENCOUNTER — Encounter: Payer: Medicare Other | Admitting: Emergency Medicine

## 2011-02-23 ENCOUNTER — Inpatient Hospital Stay: Payer: Self-pay | Admitting: Specialist

## 2011-02-23 LAB — COMPREHENSIVE METABOLIC PANEL
Albumin: 3.2 g/dL — ABNORMAL LOW (ref 3.4–5.0)
Alkaline Phosphatase: 69 U/L (ref 50–136)
Anion Gap: 11 (ref 7–16)
Calcium, Total: 9.1 mg/dL (ref 8.5–10.1)
Chloride: 97 mmol/L — ABNORMAL LOW (ref 98–107)
Creatinine: 1.34 mg/dL — ABNORMAL HIGH (ref 0.60–1.30)
Glucose: 329 mg/dL — ABNORMAL HIGH (ref 65–99)
SGOT(AST): 23 U/L (ref 15–37)
SGPT (ALT): 27 U/L
Total Protein: 7.8 g/dL (ref 6.4–8.2)

## 2011-02-23 LAB — CBC
HCT: 32.2 % — ABNORMAL LOW (ref 40.0–52.0)
HGB: 10.7 g/dL — ABNORMAL LOW (ref 13.0–18.0)
MCH: 27.2 pg (ref 26.0–34.0)
MCHC: 33.2 g/dL (ref 32.0–36.0)
MCV: 82 fL (ref 80–100)
Platelet: 423 10*3/uL (ref 150–440)
WBC: 8.2 10*3/uL (ref 3.8–10.6)

## 2011-02-24 LAB — BASIC METABOLIC PANEL
BUN: 20 mg/dL — ABNORMAL HIGH (ref 7–18)
Calcium, Total: 8.8 mg/dL (ref 8.5–10.1)
Chloride: 105 mmol/L (ref 98–107)
Co2: 26 mmol/L (ref 21–32)
Creatinine: 1.1 mg/dL (ref 0.60–1.30)
Osmolality: 288 (ref 275–301)
Potassium: 4.2 mmol/L (ref 3.5–5.1)
Sodium: 141 mmol/L (ref 136–145)

## 2011-02-24 LAB — CBC WITH DIFFERENTIAL/PLATELET
Basophil %: 0.5 %
Eosinophil #: 0.2 10*3/uL (ref 0.0–0.7)
Eosinophil %: 2.1 %
HCT: 30.4 % — ABNORMAL LOW (ref 40.0–52.0)
HGB: 10.1 g/dL — ABNORMAL LOW (ref 13.0–18.0)
Lymphocyte #: 2.2 10*3/uL (ref 1.0–3.6)
MCH: 27.1 pg (ref 26.0–34.0)
MCHC: 33.2 g/dL (ref 32.0–36.0)
MCV: 82 fL (ref 80–100)
Monocyte #: 0.6 10*3/uL (ref 0.0–0.7)
Neutrophil #: 5.8 10*3/uL (ref 1.4–6.5)
RBC: 3.72 10*6/uL — ABNORMAL LOW (ref 4.40–5.90)

## 2011-02-24 LAB — PROTIME-INR: Prothrombin Time: 22.8 secs — ABNORMAL HIGH (ref 11.5–14.7)

## 2011-02-25 LAB — PROTIME-INR
INR: 1.8
Prothrombin Time: 21.4 secs — ABNORMAL HIGH (ref 11.5–14.7)

## 2011-03-01 LAB — CULTURE, BLOOD (SINGLE)

## 2011-03-06 ENCOUNTER — Telehealth: Payer: Self-pay | Admitting: Cardiovascular Disease

## 2011-03-06 NOTE — Telephone Encounter (Signed)
Pt has been in hospital at [facility]  While an inpatient, his attending physician stopped coumadin and used lovenox.  Pt is now home and unsure if he is to resume coumadin.  I advised the pt to review his hospital discharge paperwork including his med rec and see what the doctor ordered.  It it is unclear on his paperwork, he is to call us back.  Pt agrees.

## 2011-03-06 NOTE — Telephone Encounter (Signed)
Pt states that he has been in the hospital at [facility] for about 3 days. Md stopped his coumadin while he was in the hospital. Pt has a wound on his thigh. Pt not sure why he was to hold his coumadin. Pt not sure if he needs to be on something else. Been off since 26th of January.

## 2011-03-07 ENCOUNTER — Telehealth: Payer: Self-pay | Admitting: *Deleted

## 2011-03-07 ENCOUNTER — Ambulatory Visit: Payer: Self-pay | Admitting: Cardiovascular Disease

## 2011-03-07 ENCOUNTER — Other Ambulatory Visit: Payer: Self-pay | Admitting: *Deleted

## 2011-03-07 DIAGNOSIS — I82409 Acute embolism and thrombosis of unspecified deep veins of unspecified lower extremity: Secondary | ICD-10-CM

## 2011-03-07 DIAGNOSIS — Z7901 Long term (current) use of anticoagulants: Secondary | ICD-10-CM

## 2011-03-07 MED ORDER — ROSUVASTATIN CALCIUM 20 MG PO TABS: 20.0000 mg | ORAL_TABLET | Freq: Every day | ORAL | Status: DC

## 2011-03-07 NOTE — Telephone Encounter (Signed)
03/07/11--1300p--attempted to call pt --no answer, no answering machine--also called friend listed as emergency contact--no answer , no answering machine--per dr Mariah Milling, pt is to remain on coumadin 5mg  qd and will continue to be followed by our coumadin clinic--i will notify our coumadin clinic he will remain our pt---nt

## 2011-03-07 NOTE — Telephone Encounter (Signed)
03/07/11--spoke to pt about dr Mariah Milling wanting him to go back on coumadin and pt stated he has already gone to his PCP and he wants to continue to have the PCP cover him as far as coumadin dosing is concerned--i will pass message to [place] office and our coumadin clinic--nt

## 2011-05-05 ENCOUNTER — Encounter: Payer: Self-pay | Admitting: *Deleted

## 2011-05-05 NOTE — Telephone Encounter (Signed)
This encounter was created in error - please disregard.

## 2011-05-08 ENCOUNTER — Other Ambulatory Visit: Payer: Self-pay | Admitting: *Deleted

## 2011-05-08 MED ORDER — HYDROCHLOROTHIAZIDE 25 MG PO TABS: 25.0000 mg | ORAL_TABLET | Freq: Every day | ORAL | Status: DC

## 2011-05-24 ENCOUNTER — Other Ambulatory Visit: Payer: Self-pay | Admitting: Cardiovascular Disease

## 2011-06-08 ENCOUNTER — Telehealth: Payer: Self-pay

## 2011-06-08 ENCOUNTER — Other Ambulatory Visit: Payer: Self-pay

## 2011-06-08 DIAGNOSIS — I10 Essential (primary) hypertension: Secondary | ICD-10-CM

## 2011-06-08 MED ORDER — NEBIVOLOL HCL 10 MG PO TABS: 10.0000 mg | ORAL_TABLET | Freq: Every day | ORAL | Status: DC

## 2011-06-08 NOTE — Telephone Encounter (Signed)
Refill request for bystolic 10 mg

## 2011-06-27 ENCOUNTER — Other Ambulatory Visit: Payer: Self-pay | Admitting: *Deleted

## 2011-06-27 MED ORDER — TELMISARTAN-HCTZ 80-12.5 MG PO TABS: 1.0000 | ORAL_TABLET | Freq: Every day | ORAL | Status: DC

## 2011-08-02 ENCOUNTER — Other Ambulatory Visit: Payer: Self-pay | Admitting: *Deleted

## 2011-08-02 MED ORDER — HYDROCHLOROTHIAZIDE 25 MG PO TABS: 25.0000 mg | ORAL_TABLET | Freq: Every day | ORAL | Status: DC

## 2011-08-02 NOTE — Telephone Encounter (Signed)
Refilled Hydrochlorothiazide. 

## 2011-08-07 ENCOUNTER — Ambulatory Visit: Payer: Medicare Other | Admitting: Cardiovascular Disease

## 2011-08-07 ENCOUNTER — Inpatient Hospital Stay: Payer: Self-pay | Admitting: Internal Medicine

## 2011-08-07 LAB — CBC
HCT: 31.7 % — ABNORMAL LOW (ref 40.0–52.0)
HGB: 10 g/dL — ABNORMAL LOW (ref 13.0–18.0)
MCH: 25.2 pg — ABNORMAL LOW (ref 26.0–34.0)
MCHC: 31.5 g/dL — ABNORMAL LOW (ref 32.0–36.0)
MCV: 80 fL (ref 80–100)
RBC: 3.97 10*6/uL — ABNORMAL LOW (ref 4.40–5.90)
RDW: 14.8 % — ABNORMAL HIGH (ref 11.5–14.5)

## 2011-08-07 LAB — URINALYSIS, COMPLETE
Bacteria: NONE SEEN
Bilirubin,UR: NEGATIVE
Blood: NEGATIVE
Glucose,UR: NEGATIVE mg/dL (ref 0–75)
RBC,UR: 1 /HPF (ref 0–5)
Specific Gravity: 1.013 (ref 1.003–1.030)
Squamous Epithelial: <1

## 2011-08-07 LAB — COMPREHENSIVE METABOLIC PANEL
Albumin: 3 g/dL — ABNORMAL LOW (ref 3.4–5.0)
Alkaline Phosphatase: 105 U/L (ref 50–136)
Calcium, Total: 9.3 mg/dL (ref 8.5–10.1)
Glucose: 219 mg/dL — ABNORMAL HIGH (ref 65–99)
SGOT(AST): 27 U/L (ref 15–37)
SGPT (ALT): 30 U/L
Sodium: 132 mmol/L — ABNORMAL LOW (ref 136–145)

## 2011-08-07 LAB — TROPONIN I: Troponin-I: <0.02 ng/mL

## 2011-08-08 LAB — CBC WITH DIFFERENTIAL/PLATELET
Basophil #: 0 10*3/uL (ref 0.0–0.1)
Eosinophil %: 0.1 %
Lymphocyte #: 1.5 10*3/uL (ref 1.0–3.6)
Lymphocyte %: 8.8 %
MCH: 25.5 pg — ABNORMAL LOW (ref 26.0–34.0)
MCV: 79 fL — ABNORMAL LOW (ref 80–100)
Monocyte #: 1.6 x10 3/mm — ABNORMAL HIGH (ref 0.2–1.0)
Neutrophil #: 14 10*3/uL — ABNORMAL HIGH (ref 1.4–6.5)
Neutrophil %: 81.6 %
Platelet: 381 10*3/uL (ref 150–440)
RBC: 3.39 10*6/uL — ABNORMAL LOW (ref 4.40–5.90)
RDW: 14.7 % — ABNORMAL HIGH (ref 11.5–14.5)
WBC: 17.1 10*3/uL — ABNORMAL HIGH (ref 3.8–10.6)

## 2011-08-08 LAB — COMPREHENSIVE METABOLIC PANEL
Albumin: 2.5 g/dL — ABNORMAL LOW (ref 3.4–5.0)
Alkaline Phosphatase: 95 U/L (ref 50–136)
Anion Gap: 12 (ref 7–16)
BUN: 50 mg/dL — ABNORMAL HIGH (ref 7–18)
Calcium, Total: 8.7 mg/dL (ref 8.5–10.1)
Co2: 23 mmol/L (ref 21–32)
EGFR (African American): 45 — ABNORMAL LOW
Glucose: 162 mg/dL — ABNORMAL HIGH (ref 65–99)
Potassium: 4.2 mmol/L (ref 3.5–5.1)
SGOT(AST): 25 U/L (ref 15–37)
SGPT (ALT): 26 U/L
Total Protein: 7.1 g/dL (ref 6.4–8.2)

## 2011-08-08 LAB — PROTIME-INR: INR: 3

## 2011-08-09 LAB — PROTIME-INR: Prothrombin Time: 30.5 secs — ABNORMAL HIGH (ref 11.5–14.7)

## 2011-08-10 DIAGNOSIS — I340 Nonrheumatic mitral (valve) insufficiency: Secondary | ICD-10-CM

## 2011-08-10 HISTORY — DX: Nonrheumatic mitral (valve) insufficiency: I34.0

## 2011-08-10 LAB — CBC WITH DIFFERENTIAL/PLATELET
Basophil #: 0.3 x10 3/mm 3 — ABNORMAL HIGH
Basophil %: 1.9 %
Eosinophil #: 0.3 x10 3/mm 3
Eosinophil %: 2.2 %
HCT: 28.1 % — ABNORMAL LOW
HGB: 9 g/dL — ABNORMAL LOW
Lymphocyte %: 9.4 %
Lymphs Abs: 1.4 x10 3/mm 3
MCH: 25.7 pg — ABNORMAL LOW
MCHC: 32 g/dL
MCV: 80 fL
Monocyte #: 1.1 "x10 3/mm " — ABNORMAL HIGH
Monocyte %: 7.1 %
Neutrophil #: 12.1 x10 3/mm 3 — ABNORMAL HIGH
Neutrophil %: 79.4 %
Platelet: 509 x10 3/mm 3 — ABNORMAL HIGH
RBC: 3.51 x10 6/mm 3 — ABNORMAL LOW
RDW: 14.9 % — ABNORMAL HIGH
WBC: 15.2 x10 3/mm 3 — ABNORMAL HIGH

## 2011-08-10 LAB — BASIC METABOLIC PANEL
Calcium, Total: 7.9 mg/dL — ABNORMAL LOW (ref 8.5–10.1)
Co2: 21 mmol/L (ref 21–32)
Creatinine: 1.07 mg/dL (ref 0.60–1.30)
EGFR (Non-African Amer.): >60
Glucose: 135 mg/dL — ABNORMAL HIGH (ref 65–99)
Osmolality: 280 (ref 275–301)
Potassium: 4.3 mmol/L (ref 3.5–5.1)
Sodium: 138 mmol/L (ref 136–145)

## 2011-08-10 LAB — SEDIMENTATION RATE: Erythrocyte Sed Rate: >140 mm/hr — ABNORMAL HIGH (ref 0–20)

## 2011-08-10 LAB — PROTIME-INR
INR: 2.6
Prothrombin Time: 27.9 s — ABNORMAL HIGH

## 2011-08-11 LAB — CBC WITH DIFFERENTIAL/PLATELET
Basophil %: 0.9 %
Eosinophil #: 0.2 10*3/uL (ref 0.0–0.7)
Eosinophil %: 1.9 %
HCT: 24.5 % — ABNORMAL LOW (ref 40.0–52.0)
HGB: 8 g/dL — ABNORMAL LOW (ref 13.0–18.0)
Lymphocyte #: 1.2 10*3/uL (ref 1.0–3.6)
Lymphocyte %: 9.8 %
MCH: 26 pg (ref 26.0–34.0)
MCHC: 32.5 g/dL (ref 32.0–36.0)
MCV: 80 fL (ref 80–100)
Monocyte #: 0.7 x10 3/mm (ref 0.2–1.0)
Monocyte %: 5.7 %
Neutrophil #: 9.7 10*3/uL — ABNORMAL HIGH (ref 1.4–6.5)
Platelet: 408 10*3/uL (ref 150–440)
RDW: 14.4 % (ref 11.5–14.5)

## 2011-08-11 LAB — BASIC METABOLIC PANEL WITH GFR
Anion Gap: 9
BUN: 11 mg/dL
Calcium, Total: 8 mg/dL — ABNORMAL LOW
Chloride: 111 mmol/L — ABNORMAL HIGH
Co2: 22 mmol/L
Creatinine: 1.11 mg/dL
EGFR (African American): >60
EGFR (Non-African Amer.): >60
Glucose: 64 mg/dL — ABNORMAL LOW
Osmolality: 281
Potassium: 3.8 mmol/L
Sodium: 142 mmol/L

## 2011-08-11 LAB — CULTURE, BLOOD (SINGLE)

## 2011-08-11 LAB — PROTIME-INR: Prothrombin Time: 18.9 secs — ABNORMAL HIGH (ref 11.5–14.7)

## 2011-08-13 LAB — CBC WITH DIFFERENTIAL/PLATELET
Basophil #: 0.1 10*3/uL (ref 0.0–0.1)
Basophil %: 0.7 %
Eosinophil #: 0.3 10*3/uL (ref 0.0–0.7)
Eosinophil %: 2.5 %
HCT: 26.2 % — ABNORMAL LOW (ref 40.0–52.0)
HGB: 8.5 g/dL — ABNORMAL LOW (ref 13.0–18.0)
Lymphocyte #: 1.4 10*3/uL (ref 1.0–3.6)
Lymphocyte %: 14.5 %
MCHC: 32.3 g/dL (ref 32.0–36.0)
Monocyte %: 6.1 %
Neutrophil #: 7.5 10*3/uL — ABNORMAL HIGH (ref 1.4–6.5)
Neutrophil %: 76.2 %
RBC: 3.26 10*6/uL — ABNORMAL LOW (ref 4.40–5.90)
WBC: 9.9 10*3/uL (ref 3.8–10.6)

## 2011-08-13 LAB — APTT
Activated PTT: 36.9 secs — ABNORMAL HIGH (ref 23.6–35.9)
Activated PTT: 74 secs — ABNORMAL HIGH (ref 23.6–35.9)

## 2011-08-13 LAB — BASIC METABOLIC PANEL
Anion Gap: 10 (ref 7–16)
Calcium, Total: 8 mg/dL — ABNORMAL LOW (ref 8.5–10.1)
Chloride: 108 mmol/L — ABNORMAL HIGH (ref 98–107)
Co2: 23 mmol/L (ref 21–32)
Creatinine: 1.1 mg/dL (ref 0.60–1.30)
Osmolality: 282 (ref 275–301)
Potassium: 3.9 mmol/L (ref 3.5–5.1)

## 2011-08-13 LAB — HEMOGLOBIN: HGB: 7.9 g/dL — ABNORMAL LOW (ref 13.0–18.0)

## 2011-08-14 ENCOUNTER — Ambulatory Visit: Payer: Medicare Other | Admitting: Cardiovascular Disease

## 2011-08-14 LAB — CBC WITH DIFFERENTIAL/PLATELET
Basophil %: 0.7 %
Eosinophil #: 0.2 10*3/uL (ref 0.0–0.7)
Eosinophil %: 2.2 %
HGB: 7.5 g/dL — ABNORMAL LOW (ref 13.0–18.0)
MCH: 26 pg (ref 26.0–34.0)
MCHC: 32.6 g/dL (ref 32.0–36.0)
Monocyte #: 0.5 x10 3/mm (ref 0.2–1.0)
Monocyte %: 6.7 %
Neutrophil %: 68.5 %

## 2011-08-14 LAB — PROTIME-INR: INR: 1.2

## 2011-08-14 LAB — PATHOLOGY REPORT

## 2011-08-15 LAB — APTT: Activated PTT: 124.1 secs — ABNORMAL HIGH (ref 23.6–35.9)

## 2011-08-15 LAB — PROTIME-INR: Prothrombin Time: 15.2 secs — ABNORMAL HIGH (ref 11.5–14.7)

## 2011-08-16 LAB — CULTURE, BLOOD (SINGLE)

## 2011-08-16 LAB — PATHOLOGY REPORT

## 2011-08-17 LAB — CBC WITH DIFFERENTIAL/PLATELET
Basophil %: 0.8 %
Eosinophil #: 0.2 10*3/uL (ref 0.0–0.7)
Eosinophil %: 2.5 %
HGB: 7.1 g/dL — ABNORMAL LOW (ref 13.0–18.0)
MCH: 26 pg (ref 26.0–34.0)
MCHC: 32.4 g/dL (ref 32.0–36.0)
Monocyte #: 0.6 x10 3/mm (ref 0.2–1.0)
Monocyte %: 6.6 %
Neutrophil %: 71.4 %
Platelet: 411 10*3/uL (ref 150–440)
RBC: 2.73 10*6/uL — ABNORMAL LOW (ref 4.40–5.90)
WBC: 8.8 10*3/uL (ref 3.8–10.6)

## 2011-08-17 LAB — WOUND CULTURE

## 2011-08-17 LAB — APTT: Activated PTT: 127.4 secs — ABNORMAL HIGH (ref 23.6–35.9)

## 2011-08-18 LAB — CBC WITH DIFFERENTIAL/PLATELET
Basophil %: 0.5 %
Eosinophil #: 0.2 10*3/uL (ref 0.0–0.7)
Eosinophil %: 2.8 %
HCT: 23 % — ABNORMAL LOW (ref 40.0–52.0)
HGB: 7.5 g/dL — ABNORMAL LOW (ref 13.0–18.0)
Lymphocyte #: 1.4 10*3/uL (ref 1.0–3.6)
MCH: 26.1 pg (ref 26.0–34.0)
MCHC: 32.6 g/dL (ref 32.0–36.0)
MCV: 80 fL (ref 80–100)
Monocyte #: 0.6 x10 3/mm (ref 0.2–1.0)
Neutrophil #: 5.3 10*3/uL (ref 1.4–6.5)
Neutrophil %: 70.1 %
Platelet: 399 10*3/uL (ref 150–440)
RBC: 2.87 10*6/uL — ABNORMAL LOW (ref 4.40–5.90)
RDW: 15.2 % — ABNORMAL HIGH (ref 11.5–14.5)

## 2011-08-24 LAB — LAB REPORT - SCANNED: INR: 1.2

## 2011-09-01 ENCOUNTER — Encounter: Payer: Self-pay | Admitting: Cardiovascular Disease

## 2011-09-18 ENCOUNTER — Encounter: Payer: Self-pay | Admitting: Cardiovascular Disease

## 2011-09-18 ENCOUNTER — Ambulatory Visit (INDEPENDENT_AMBULATORY_CARE_PROVIDER_SITE_OTHER): Payer: Medicare Other | Admitting: Cardiovascular Disease

## 2011-09-18 ENCOUNTER — Encounter: Payer: Medicare Other | Admitting: Cardiovascular Disease

## 2011-09-18 VITALS — BP 110/60 | HR 72 | Ht 70.0 in | Wt 240.8 lb

## 2011-09-18 DIAGNOSIS — E119 Type 2 diabetes mellitus without complications: Secondary | ICD-10-CM

## 2011-09-18 DIAGNOSIS — I25810 Atherosclerosis of coronary artery bypass graft(s) without angina pectoris: Secondary | ICD-10-CM

## 2011-09-18 DIAGNOSIS — E785 Hyperlipidemia, unspecified: Secondary | ICD-10-CM

## 2011-09-18 DIAGNOSIS — R0602 Shortness of breath: Secondary | ICD-10-CM

## 2011-09-18 DIAGNOSIS — I82409 Acute embolism and thrombosis of unspecified deep veins of unspecified lower extremity: Secondary | ICD-10-CM

## 2011-09-18 NOTE — Assessment & Plan Note (Signed)
Cholesterol is at goal on the current lipid regimen. No changes to the medications were made.  

## 2011-09-18 NOTE — Progress Notes (Signed)
Patient ID: Benjamin Davidson, male    DOB: [birth year], 61 y.o.   MRN: [id number]  HPI Comments: 61 year old gentleman with a history of Obesity, coronary artery disease, bypass surgery March 2008, diabetes, obesity, history of DVT in the left leg, he is on chronic warfarin who presents for routine followup.    Benjamin Davidson states overall he is doing well. He has lost approximately 30 pounds since his last clinic visit. He currently has a PICC line and is on antibiotics for osteomyelitis. He had amputation of 2 toes recently. He has a wound VAC on his right foot. He is in good spirits overall. He denies any significant shortness of breath or chest pain   He had gangrene of the fifth toe on the right with acute renal failure and dehydration admitted 08/07/2011    Takes crestor 20 mg  In the morning with no complaints. He had previous leg cramping when taken at nighttime.  last stress test July 2008 showing mild ischemia in the mid anteroseptal, apical anterior and apical septal region. Ejection fraction 40%. This test was done post bypass. No cardiac catheterization was done in followup as he had no symptoms.    EKG shows normal sinus rhythm with rate of 70 beats per minute, left bundle branch block.     Outpatient Encounter Prescriptions as of 09/18/2011  Medication Sig Dispense Refill  . aspirin EC 325 MG EC tablet Take 325 mg by mouth daily.        . Flaxseed, Linseed, (FLAX SEED OIL) 1000 MG CAPS Take 2 capsules by mouth daily.       . furosemide (LASIX) 20 MG tablet TAKE ONE TABLET BY MOUTH EVERY DAY. IF INCREASED SWELLING, MAY TAKE ONE ADDITIONAL TABLET DAILY AS NEEDED.  60 tablet  5  . hydrochlorothiazide (HYDRODIURIL) 25 MG tablet Take 1 tablet (25 mg total) by mouth daily.  30 tablet  6  . insulin NPH-insulin regular (NOVOLIN 70/30) (70-30) 100 UNIT/ML injection Inject 35 Units into the skin 2 (two) times daily with a meal.        . Insulin Pen Needle (NOVOFINE 31) 31G X 6 MM MISC by  Does not apply route.        . Insulin Pen Needle (NOVOFINE) 32G X 6 MM MISC by Does not apply route.        . nebivolol (BYSTOLIC) 10 MG tablet Take 1 tablet (10 mg total) by mouth daily.  90 tablet  3  . Pediatric Multiple Vit-C-FA (FLINSTONES GUMMIES OMEGA-3 DHA PO) Take by mouth daily.      . rosuvastatin (CRESTOR) 20 MG tablet Take 1 tablet (20 mg total) by mouth at bedtime.  90 tablet  1  . sertraline (ZOLOFT) 100 MG tablet Take 100 mg by mouth daily.        Marland Kitchen telmisartan-hydrochlorothiazide (MICARDIS HCT) 80-12.5 MG per tablet Take 1 tablet by mouth daily.  90 tablet  3  . warfarin (COUMADIN) 5 MG tablet TAKE ONE TABLET BY MOUTH AS DIRECTED  120 tablet  0  . DISCONTD: loratadine (CLARITIN) 10 MG tablet Take 10 mg by mouth daily as needed.           Review of Systems  Constitutional: Negative.   HENT: Negative.   Eyes: Negative.   Respiratory: Negative.   Cardiovascular: Negative.   Gastrointestinal: Negative.   Musculoskeletal: Positive for gait problem.  Skin: Negative.   Neurological: Negative.   Hematological: Negative.   Psychiatric/Behavioral: Negative.  All other systems reviewed and are negative.     BP 110/60  Pulse 72  Ht 5\' 10"  (1.778 m)  Wt 240 lb 12 oz (109.203 kg)  BMI 34.54 kg/m2  Physical Exam  Nursing note and vitals reviewed. Constitutional: He is oriented to person, place, and time. He appears well-developed and well-nourished.       Obese, wound VAC is on his right foot, walks with a walker  HENT:  Head: Normocephalic.  Nose: Nose normal.  Mouth/Throat: Oropharynx is clear and moist.  Eyes: Conjunctivae are normal. Pupils are equal, round, and reactive to light.  Neck: Normal range of motion. Neck supple. No JVD present.  Cardiovascular: Normal rate, regular rhythm, S1 normal, S2 normal, normal heart sounds and intact distal pulses.  Exam reveals no gallop and no friction rub.   No murmur heard. Pulmonary/Chest: Effort normal and breath sounds  normal. No respiratory distress. He has no wheezes. He has no rales. He exhibits no tenderness.  Abdominal: Soft. Bowel sounds are normal. He exhibits no distension. There is no tenderness.  Musculoskeletal: Normal range of motion. He exhibits no edema and no tenderness.  Lymphadenopathy:    He has no cervical adenopathy.  Neurological: He is alert and oriented to person, place, and time. Coordination normal.  Skin: Skin is warm and dry. No rash noted. No erythema.  Psychiatric: He has a normal mood and affect. His behavior is normal. Judgment and thought content normal.           Assessment and Plan

## 2011-09-18 NOTE — Assessment & Plan Note (Signed)
Currently on warfarin. 

## 2011-09-18 NOTE — Patient Instructions (Addendum)
You are doing well. No medication changes were made.  Please call us if you have new issues that need to be addressed before your next appt.  Your physician wants you to follow-up in: 6 months.  You will receive a reminder letter in the mail two months in advance. If you don't receive a letter, please call our office to schedule the follow-up appointment.   

## 2011-09-18 NOTE — Assessment & Plan Note (Signed)
Sugars have improved with his 30 pound weight loss

## 2011-09-18 NOTE — Assessment & Plan Note (Signed)
Currently with no symptoms of angina. No further workup at this time. Continue current medication regimen. 

## 2012-01-18 ENCOUNTER — Other Ambulatory Visit: Payer: Self-pay

## 2012-01-18 MED ORDER — ROSUVASTATIN CALCIUM 20 MG PO TABS: 20.0000 mg | ORAL_TABLET | Freq: Every day | ORAL | Status: DC

## 2012-01-18 NOTE — Telephone Encounter (Signed)
Refill sent for crestor 20 mg  

## 2012-02-06 ENCOUNTER — Other Ambulatory Visit: Payer: Self-pay

## 2012-02-06 ENCOUNTER — Telehealth: Payer: Self-pay | Admitting: Cardiovascular Disease

## 2012-02-06 MED ORDER — FUROSEMIDE 20 MG PO TABS: ORAL_TABLET | ORAL | Status: DC

## 2012-02-06 NOTE — Telephone Encounter (Signed)
Refill sent for lasix 20 mg  

## 2012-02-06 NOTE — Telephone Encounter (Signed)
Pt went to pick up micardis and bystolic and that his copay now is 45.00. Pt states that his insurance company told him that Dr Mariah Milling could call the pharmacy and have his co pay lowered. I informed pt that I have never heard of this happening but that i will let the nurse know.

## 2012-02-07 NOTE — Telephone Encounter (Signed)
Can you help me figure this out? thanks

## 2012-02-08 NOTE — Telephone Encounter (Signed)
NA x ,1 unable to leave a message.

## 2012-02-09 ENCOUNTER — Telehealth: Payer: Self-pay | Admitting: *Deleted

## 2012-02-09 NOTE — Telephone Encounter (Signed)
Patient is now having to pay 45.00 a month for micardis and bystolic. He was paying 6.00 for 90 day supply and needs to know what to do.

## 2012-02-09 NOTE — Telephone Encounter (Signed)
Error

## 2012-02-09 NOTE — Telephone Encounter (Signed)
Spoke with the pharmacist regarding the micardis and bystolic cost. Would like to know if can change the patient to another preferred generic medication such as cozaar & hctz and a generic betablocker other than bystolic. The micardis does go generic today but will not be available for several months for the insurance to recognize it as a generic. Please advise what medication to switch him to.

## 2012-02-09 NOTE — Telephone Encounter (Signed)
Pt returning nurse call

## 2012-02-12 ENCOUNTER — Other Ambulatory Visit: Payer: Self-pay

## 2012-02-12 ENCOUNTER — Telehealth: Payer: Self-pay | Admitting: *Deleted

## 2012-02-12 MED ORDER — LOSARTAN POTASSIUM-HCTZ 100-12.5 MG PO TABS: 1.0000 | ORAL_TABLET | Freq: Every day | ORAL | Status: DC

## 2012-02-12 MED ORDER — CARVEDILOL 6.25 MG PO TABS: 6.2500 mg | ORAL_TABLET | Freq: Two times a day (BID) | ORAL | Status: DC

## 2012-02-12 NOTE — Telephone Encounter (Signed)
Pt calling wants to know what Dr Mariah Milling decided about his medications.

## 2012-02-12 NOTE — Telephone Encounter (Signed)
Okay to change to losartan HCT 100/12.5 mg daily Or Diovan HCT 320/12.5 mg daily  With change beta blocker/bystolic to Coreg 6.25 mg twice a day Monitor blood pressure for several weeks after changes Call for any side effects

## 2012-02-12 NOTE — Telephone Encounter (Signed)
Pt informed Understanding verb New RX sent to pharm

## 2012-02-21 ENCOUNTER — Telehealth: Payer: Self-pay

## 2012-02-21 NOTE — Telephone Encounter (Signed)
Error

## 2012-02-22 NOTE — Telephone Encounter (Signed)
Error

## 2012-04-15 ENCOUNTER — Telehealth: Payer: Self-pay

## 2012-04-15 NOTE — Telephone Encounter (Signed)
.   Pt states he needs to talk to you about his cholesterol medicine

## 2012-04-16 NOTE — Telephone Encounter (Signed)
Pt says crestor costing him too much $ We do not have samples of 20 mg tabs He asks if we can change him to atorvastatin, that this would be more cost efficient for him He also says his pharmacy faxed a form about this yesterday I will check on this and will discuss with Dr. Mariah Milling and call him back He confirms he has enough crestor to last x 1 week

## 2012-04-16 NOTE — Telephone Encounter (Signed)
See below Do we have this form from pharmacy?

## 2012-04-17 NOTE — Telephone Encounter (Signed)
Spoke with Wille Celeste (sister) told her we will mail the information to her for the patient assistance to get help with the crestor.

## 2012-04-22 NOTE — Telephone Encounter (Signed)
Gave samples of crestor 20 mg 

## 2012-04-25 ENCOUNTER — Telehealth: Payer: Self-pay | Admitting: *Deleted

## 2012-04-25 NOTE — Telephone Encounter (Signed)
Pt needs a letter stating that he is ok to exercise at [employer]. Call when ready to pick up

## 2012-04-26 NOTE — Telephone Encounter (Signed)
Ok to exercise 

## 2012-04-26 NOTE — Telephone Encounter (Signed)
Okay to exercise Would proceed slowly with aerobic activity or weights Buildup over several weeks Our office for any shortness of breath or chest pain

## 2012-04-29 NOTE — Telephone Encounter (Signed)
Pt informed letter at [facility] Understanding verb

## 2012-05-08 ENCOUNTER — Telehealth: Payer: Self-pay

## 2012-05-08 NOTE — Telephone Encounter (Signed)
Pt would like a letter faxed to [employer] stating he can workout.

## 2012-05-08 NOTE — Telephone Encounter (Signed)
Faxed letter that was upfront to [employer] stating he can work out.

## 2012-07-15 ENCOUNTER — Other Ambulatory Visit: Payer: Self-pay | Admitting: *Deleted

## 2012-07-15 MED ORDER — HYDROCHLOROTHIAZIDE 25 MG PO TABS: 25.0000 mg | ORAL_TABLET | Freq: Every day | ORAL | Status: DC

## 2012-07-15 NOTE — Telephone Encounter (Signed)
Refilled Hydrochlorothiazide #30 Refill#1 Lmtcb pt due for a 6 month f/u.

## 2012-09-11 ENCOUNTER — Other Ambulatory Visit: Payer: Self-pay

## 2012-09-11 MED ORDER — HYDROCHLOROTHIAZIDE 25 MG PO TABS: 25.0000 mg | ORAL_TABLET | Freq: Every day | ORAL | Status: DC

## 2012-09-11 NOTE — Telephone Encounter (Signed)
Refill sent for HCTZ 

## 2012-10-02 ENCOUNTER — Telehealth: Payer: Self-pay

## 2012-10-02 NOTE — Telephone Encounter (Signed)
Pt would like Crestor 20 mg samples. Please call if we have any

## 2012-10-02 NOTE — Telephone Encounter (Signed)
Pt aware that samples of crestor have been placed at front desk for pick up. Pt also scheduled future appointment with Dr. Mariah Milling overdue for 6 month f/u.

## 2012-10-16 ENCOUNTER — Encounter: Payer: Self-pay | Admitting: Cardiovascular Disease

## 2012-10-16 ENCOUNTER — Ambulatory Visit (INDEPENDENT_AMBULATORY_CARE_PROVIDER_SITE_OTHER): Payer: Medicare Other | Admitting: Cardiovascular Disease

## 2012-10-16 VITALS — BP 128/78 | HR 70 | Ht 69.5 in | Wt 273.5 lb

## 2012-10-16 DIAGNOSIS — E7800 Pure hypercholesterolemia, unspecified: Secondary | ICD-10-CM

## 2012-10-16 DIAGNOSIS — I25810 Atherosclerosis of coronary artery bypass graft(s) without angina pectoris: Secondary | ICD-10-CM

## 2012-10-16 DIAGNOSIS — R0602 Shortness of breath: Secondary | ICD-10-CM

## 2012-10-16 DIAGNOSIS — R079 Chest pain, unspecified: Secondary | ICD-10-CM

## 2012-10-16 DIAGNOSIS — I10 Essential (primary) hypertension: Secondary | ICD-10-CM

## 2012-10-16 DIAGNOSIS — IMO0001 Reserved for inherently not codable concepts without codable children: Secondary | ICD-10-CM

## 2012-10-16 DIAGNOSIS — I82409 Acute embolism and thrombosis of unspecified deep veins of unspecified lower extremity: Secondary | ICD-10-CM

## 2012-10-16 MED ORDER — ROSUVASTATIN CALCIUM 40 MG PO TABS: 40.0000 mg | ORAL_TABLET | Freq: Every day | ORAL | Status: DC

## 2012-10-16 NOTE — Patient Instructions (Addendum)
You are doing well. No medication changes were made. Please stay on crestor daily  Please call us if you have new issues that need to be addressed before your next appt.  Your physician wants you to follow-up in: 6 months.  You will receive a reminder letter in the mail two months in advance. If you don't receive a letter, please call our office to schedule the follow-up appointment.

## 2012-10-16 NOTE — Progress Notes (Signed)
Patient ID: Benjamin Davidson, male    DOB: 1950-10-25, 62 y.o.   MRN: [id number]  HPI Comments: 62 year old gentleman with a history of Obesity, coronary artery disease, bypass surgery March 2008, diabetes, sedentary lifestyle,  history of DVT in the left leg,  on chronic warfarin who presents for routine followup. 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 He had gangrene of the fifth toe on the right with acute renal failure and dehydration admitted 08/07/2011    Benjamin Davidson. His weight continues to be a major problem. In followup today, He denies any significant shortness of breath or chest pain He checks his sugars 2-3 times a day. Typically they are "high". Reports they were better this morning at 189 He continues to have chronic lower extremity edema, worse on the right leg and the left leg. Relatively stable   Takes crestor 20 mg  In the morning with no complaints. He had previous leg cramping when taken at nighttime. He is unable to afford the Crestor recently causing him $45. In January his we'll decrease down to an affordable level.   last stress test July 2008 showing mild ischemia in the mid anteroseptal, apical anterior and apical septal region. Ejection fraction 40%. This test was done post bypass. No cardiac catheterization was done in followup as he had no symptoms.    EKG shows normal sinus rhythm with rate of 70 beats per minute, left bundle branch block.     Outpatient Encounter Prescriptions as of 10/16/2012  Medication Sig Dispense Refill  . aspirin EC 325 MG EC tablet Take 325 mg by mouth daily.        . carvedilol (COREG) 6.25 MG tablet Take 1 tablet (6.25 mg total) by mouth 2 (two) times daily.  180 tablet  3  . Flaxseed, Linseed, (FLAX SEED OIL) 1000 MG CAPS Take 2 capsules by mouth daily.       . furosemide (LASIX) 20 MG tablet Take one tablet by  mouth every day. If increased swelling, may take one additional tablet daily as needed.  60 tablet  5  . gabapentin (NEURONTIN) 300 MG capsule Take 300 mg by mouth 2 (two) times daily.       . hydrochlorothiazide (HYDRODIURIL) 25 MG tablet Take 1 tablet (25 mg total) by mouth daily.  30 tablet  6  . insulin NPH-insulin regular (NOVOLIN 70/30) (70-30) 100 UNIT/ML injection Inject 35 Units into the skin 2 (two) times daily with a meal.        . Insulin Pen Needle (NOVOFINE 31) 31G X 6 MM MISC by Does not apply route.        . Insulin Pen Needle (NOVOFINE) 32G X 6 MM MISC by Does not apply route.        Marland Kitchen losartan-hydrochlorothiazide (HYZAAR) 100-12.5 MG per tablet Take 1 tablet by mouth daily.  90 tablet  3  . NOVOLIN R 100 UNIT/ML injection as needed.       . Pediatric Multiple Vit-C-FA (FLINSTONES GUMMIES OMEGA-3 DHA PO) Take by mouth daily.      . rosuvastatin (CRESTOR) 20 MG tablet Take 1 tablet (20 mg total) by mouth at bedtime.  90 tablet  1  . sertraline (ZOLOFT) 100 MG tablet Take 100 mg by mouth daily.        Marland Kitchen warfarin (COUMADIN) 5 MG tablet TAKE ONE TABLET BY MOUTH  AS DIRECTED  120 tablet  0    Review of Systems  Constitutional: Negative.   HENT: Negative.   Eyes: Negative.   Respiratory: Negative.   Cardiovascular: Negative.   Gastrointestinal: Negative.   Endocrine: Negative.   Musculoskeletal: Positive for gait problem.  Skin: Negative.   Allergic/Immunologic: Negative.   Neurological: Negative.   Hematological: Negative.   Psychiatric/Behavioral: Negative.   All other systems reviewed and are negative.     BP 128/78  Pulse 70  Ht 5' 9.5" (1.765 m)  Wt 273 lb 8 oz (124.059 kg)  BMI 39.82 kg/m2  Physical Exam  Nursing note and vitals reviewed. Constitutional: He is oriented to person, place, and time. He appears Davidson-developed and Davidson-nourished.  Obese  HENT:  Head: Normocephalic.  Nose: Nose normal.  Mouth/Throat: Oropharynx is clear and moist.  Eyes:  Conjunctivae are normal. Pupils are equal, round, and reactive to light.  Neck: Normal range of motion. Neck supple. No JVD present.  Cardiovascular: Normal rate, regular rhythm, S1 normal, S2 normal, normal heart sounds and intact distal pulses.  Exam reveals no gallop and no friction rub.   No murmur heard. Pulmonary/Chest: Effort normal and breath sounds normal. No respiratory distress. He has no wheezes. He has no rales. He exhibits no tenderness.  Abdominal: Soft. Bowel sounds are normal. He exhibits no distension. There is no tenderness.  Musculoskeletal: Normal range of motion. He exhibits no edema and no tenderness.  Lymphadenopathy:    He has no cervical adenopathy.  Neurological: He is alert and oriented to person, place, and time. Coordination normal.  Skin: Skin is warm and dry. No rash noted. No erythema.  Psychiatric: He has a normal mood and affect. His behavior is normal. Judgment and thought content normal.      Assessment and Plan

## 2012-10-16 NOTE — Assessment & Plan Note (Signed)
We have encouraged continued exercise, careful diet management in an effort to lose weight. 

## 2012-10-16 NOTE — Assessment & Plan Note (Signed)
Samples of Crestor provided to him today. Encouraged him to stay on this. He reports this will be more affordable in January 2015

## 2012-10-16 NOTE — Assessment & Plan Note (Signed)
Currently maintained on warfarin

## 2012-10-16 NOTE — Assessment & Plan Note (Signed)
Blood pressure is well controlled on today's visit. No changes made to the medications. 

## 2012-10-16 NOTE — Assessment & Plan Note (Signed)
Currently with no symptoms of angina. No further workup at this time. Continue current medication regimen. He is high risk for coronary artery disease progression given his poorly controlled diabetes and hyperlipidemia.

## 2013-01-06 ENCOUNTER — Other Ambulatory Visit: Payer: Self-pay | Admitting: *Deleted

## 2013-01-06 MED ORDER — ROSUVASTATIN CALCIUM 40 MG PO TABS: 40.0000 mg | ORAL_TABLET | Freq: Every day | ORAL | Status: DC

## 2013-01-06 NOTE — Telephone Encounter (Signed)
Requested Prescriptions   Signed Prescriptions Disp Refills  . rosuvastatin (CRESTOR) 40 MG tablet 90 tablet 3    Sig: Take 1 tablet (40 mg total) by mouth at bedtime.    Authorizing Provider: Antonieta Iba    Ordering User: Kendrick Fries

## 2013-02-03 ENCOUNTER — Ambulatory Visit: Payer: Medicare Other | Admitting: Internal Medicine

## 2013-02-04 ENCOUNTER — Ambulatory Visit: Payer: Medicare Other | Admitting: Internal Medicine

## 2013-02-05 ENCOUNTER — Other Ambulatory Visit: Payer: Self-pay

## 2013-02-05 MED ORDER — LOSARTAN POTASSIUM-HCTZ 100-12.5 MG PO TABS: 1.0000 | ORAL_TABLET | Freq: Every day | ORAL | Status: DC

## 2013-02-05 MED ORDER — CARVEDILOL 6.25 MG PO TABS: 6.2500 mg | ORAL_TABLET | Freq: Two times a day (BID) | ORAL | Status: DC

## 2013-02-05 NOTE — Telephone Encounter (Signed)
Refill carvedilol 6.25 mg

## 2013-02-05 NOTE — Telephone Encounter (Signed)
Refill sent for losartan/hctz 100-12.5 mg

## 2013-02-25 ENCOUNTER — Other Ambulatory Visit: Payer: Self-pay

## 2013-02-25 MED ORDER — FUROSEMIDE 20 MG PO TABS: ORAL_TABLET | ORAL | Status: DC

## 2013-02-25 NOTE — Telephone Encounter (Signed)
Refill sent for furosemide 20 mg 

## 2013-04-01 ENCOUNTER — Other Ambulatory Visit: Payer: Self-pay

## 2013-04-01 MED ORDER — HYDROCHLOROTHIAZIDE 25 MG PO TABS: 25.0000 mg | ORAL_TABLET | Freq: Every day | ORAL | Status: DC

## 2013-04-01 NOTE — Telephone Encounter (Signed)
Refill sent HCTZ 25 mg take one tablet daily.

## 2013-05-21 ENCOUNTER — Ambulatory Visit (INDEPENDENT_AMBULATORY_CARE_PROVIDER_SITE_OTHER): Payer: Medicare HMO | Admitting: Cardiovascular Disease

## 2013-05-21 ENCOUNTER — Encounter: Payer: Self-pay | Admitting: Cardiovascular Disease

## 2013-05-21 VITALS — BP 112/72 | HR 70 | Ht 70.0 in | Wt 268.2 lb

## 2013-05-21 DIAGNOSIS — Z7901 Long term (current) use of anticoagulants: Secondary | ICD-10-CM

## 2013-05-21 DIAGNOSIS — R0602 Shortness of breath: Secondary | ICD-10-CM

## 2013-05-21 DIAGNOSIS — E1165 Type 2 diabetes mellitus with hyperglycemia: Secondary | ICD-10-CM

## 2013-05-21 DIAGNOSIS — E785 Hyperlipidemia, unspecified: Secondary | ICD-10-CM

## 2013-05-21 DIAGNOSIS — IMO0001 Reserved for inherently not codable concepts without codable children: Secondary | ICD-10-CM

## 2013-05-21 DIAGNOSIS — R609 Edema, unspecified: Secondary | ICD-10-CM

## 2013-05-21 DIAGNOSIS — I25810 Atherosclerosis of coronary artery bypass graft(s) without angina pectoris: Secondary | ICD-10-CM

## 2013-05-21 DIAGNOSIS — I10 Essential (primary) hypertension: Secondary | ICD-10-CM

## 2013-05-21 NOTE — Assessment & Plan Note (Signed)
Baseline chronic mild shortness of breath over the past several years. Relatively stable. Still exercising periodically. Suspect this is secondary to obesity and deconditioning

## 2013-05-21 NOTE — Assessment & Plan Note (Signed)
Blood pressure is well controlled on today's visit. No changes made to the medications. Discussed his suggestion to change back to Micardis and bytsolic. This would like to cost him significant co-pay monthly. We will leave his medications as they are

## 2013-05-21 NOTE — Patient Instructions (Addendum)
You are doing well. No medication changes were made.  Please call us if you have new issues that need to be addressed before your next appt.  Your physician wants you to follow-up in: 6 months.  You will receive a reminder letter in the mail two months in advance. If you don't receive a letter, please call our office to schedule the follow-up appointment.   

## 2013-05-21 NOTE — Progress Notes (Signed)
Patient ID: Benjamin Davidson, male    DOB: 10/24/1950, 63 y.o.   MRN: [id number]  HPI Comments: 63 year old gentleman with a history of obesity, coronary artery disease, bypass surgery March 2008, poorly controlled diabetes, sedentary lifestyle,  history of DVT in the left leg,  on chronic warfarin who presents for routine followup. 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 He had gangrene of the fifth toe on the right with acute renal failure and dehydration admitted 08/07/2011    Mr. Low states overall he is doing well. His weight continues to be a major problem. He does report that his weight is down from last year. He is trying to eat better  He denies any significant shortness of breath or chest pain. He is trying to work out at [employer], previously worked with a Clinical research associate but this did not go well. Looking to change gyms.  He checks his sugars 2-3 times a day. Typically they are "high". Typically they run around 200 This is managed by Dr. Laurian Brim He continues to have chronic lower extremity edema, worse on the right leg and the left leg. Relatively stable   Takes crestor 40 mg  In the morning with no complaints. Rare leg cramping He states that he would like to change back to my card [place] and bystolic. He was on these before. Currently with no side effects on his current medications  last stress test July 2008 showing mild ischemia in the mid anteroseptal, apical anterior and apical septal region. Ejection fraction 40%. This test was done post bypass. No cardiac catheterization was done in followup as he had no symptoms.    EKG shows normal sinus rhythm with rate of 70 beats per minute, left bundle branch block.     Outpatient Encounter Prescriptions as of 05/21/2013  Medication Sig  . aspirin EC 325 MG EC tablet Take 325 mg by mouth daily.    . carvedilol (COREG) 6.25 MG tablet Take 1 tablet  (6.25 mg total) by mouth 2 (two) times daily.  . Flaxseed, Linseed, (FLAX SEED OIL) 1000 MG CAPS Take 2 capsules by mouth daily.   . furosemide (LASIX) 20 MG tablet Take one tablet by mouth every day. If increased swelling, may take one additional tablet daily as needed.  . gabapentin (NEURONTIN) 300 MG capsule Take 300 mg by mouth 2 (two) times daily.   . hydrochlorothiazide (HYDRODIURIL) 25 MG tablet Take 1 tablet (25 mg total) by mouth daily.  . insulin NPH-insulin regular (NOVOLIN 70/30) (70-30) 100 UNIT/ML injection Inject 35 Units into the skin 2 (two) times daily with a meal.    . Insulin Pen Needle (NOVOFINE 31) 31G X 6 MM MISC by Does not apply route.    . Insulin Pen Needle (NOVOFINE) 32G X 6 MM MISC by Does not apply route.    Marland Kitchen losartan-hydrochlorothiazide (HYZAAR) 100-12.5 MG per tablet Take 1 tablet by mouth daily.  Marland Kitchen NOVOLIN R 100 UNIT/ML injection as needed.   . Pediatric Multiple Vit-C-FA (FLINSTONES GUMMIES OMEGA-3 DHA PO) Take by mouth daily.  . rosuvastatin (CRESTOR) 40 MG tablet Take 1 tablet (40 mg total) by mouth at bedtime.  . sertraline (ZOLOFT) 100 MG tablet Take 100 mg by mouth daily.    Marland Kitchen warfarin (COUMADIN) 5 MG tablet TAKE ONE TABLET BY MOUTH AS DIRECTED    Review of Systems  Constitutional: Negative.   HENT: Negative.  Eyes: Negative.   Respiratory: Negative.   Cardiovascular: Negative.   Gastrointestinal: Negative.   Endocrine: Negative.   Musculoskeletal: Positive for gait problem.  Skin: Negative.   Allergic/Immunologic: Negative.   Neurological: Negative.   Hematological: Negative.   Psychiatric/Behavioral: Negative.   All other systems reviewed and are negative.   BP 112/72  Pulse 70  Ht 5\' 10"  (1.778 m)  Wt 268 lb 4 oz (121.677 kg)  BMI 38.49 kg/m2  Physical Exam  Nursing note and vitals reviewed. Constitutional: He is oriented to person, place, and time. He appears well-developed and well-nourished.  Obese  HENT:  Head: Normocephalic.   Nose: Nose normal.  Mouth/Throat: Oropharynx is clear and moist.  Eyes: Conjunctivae are normal. Pupils are equal, round, and reactive to light.  Neck: Normal range of motion. Neck supple. No JVD present.  Cardiovascular: Normal rate, regular rhythm, S1 normal, S2 normal, normal heart sounds and intact distal pulses.  Exam reveals no gallop and no friction rub.   No murmur heard. Pulmonary/Chest: Effort normal and breath sounds normal. No respiratory distress. He has no wheezes. He has no rales. He exhibits no tenderness.  Abdominal: Soft. Bowel sounds are normal. He exhibits no distension. There is no tenderness.  Musculoskeletal: Normal range of motion. He exhibits no edema and no tenderness.  Lymphadenopathy:    He has no cervical adenopathy.  Neurological: He is alert and oriented to person, place, and time. Coordination normal.  Skin: Skin is warm and dry. No rash noted. No erythema.  Psychiatric: He has a normal mood and affect. His behavior is normal. Judgment and thought content normal.      Assessment and Plan

## 2013-05-21 NOTE — Assessment & Plan Note (Signed)
We have encouraged continued exercise, careful diet management in an effort to lose weight. 

## 2013-05-21 NOTE — Assessment & Plan Note (Signed)
Mild chronic leg edema likely from venous insufficiency. Recommended he wear compression hose as needed

## 2013-05-21 NOTE — Assessment & Plan Note (Signed)
No complications on his warfarin.

## 2013-05-21 NOTE — Assessment & Plan Note (Signed)
We will try to obtain his most recent lipid panel from Dr. Hardin Negus. Goal LDL less than 70

## 2013-05-21 NOTE — Assessment & Plan Note (Signed)
Currently with no symptoms of angina. No further workup at this time. Continue current medication regimen. High risk of recurrence CAD given his poorly controlled diabetes

## 2013-10-17 LAB — HM DIABETES EYE EXAM

## 2013-10-21 IMAGING — CR RIGHT FOOT - 2 VIEW
1 series · 2 of 2 positions shown · non-contrast
Comparison: none

REASON FOR EXAM: black 5th toe
COMMENTS:

[Series 1: ap · 0.17mm/px · 2 of 2 slices shown]
[im 1/2]
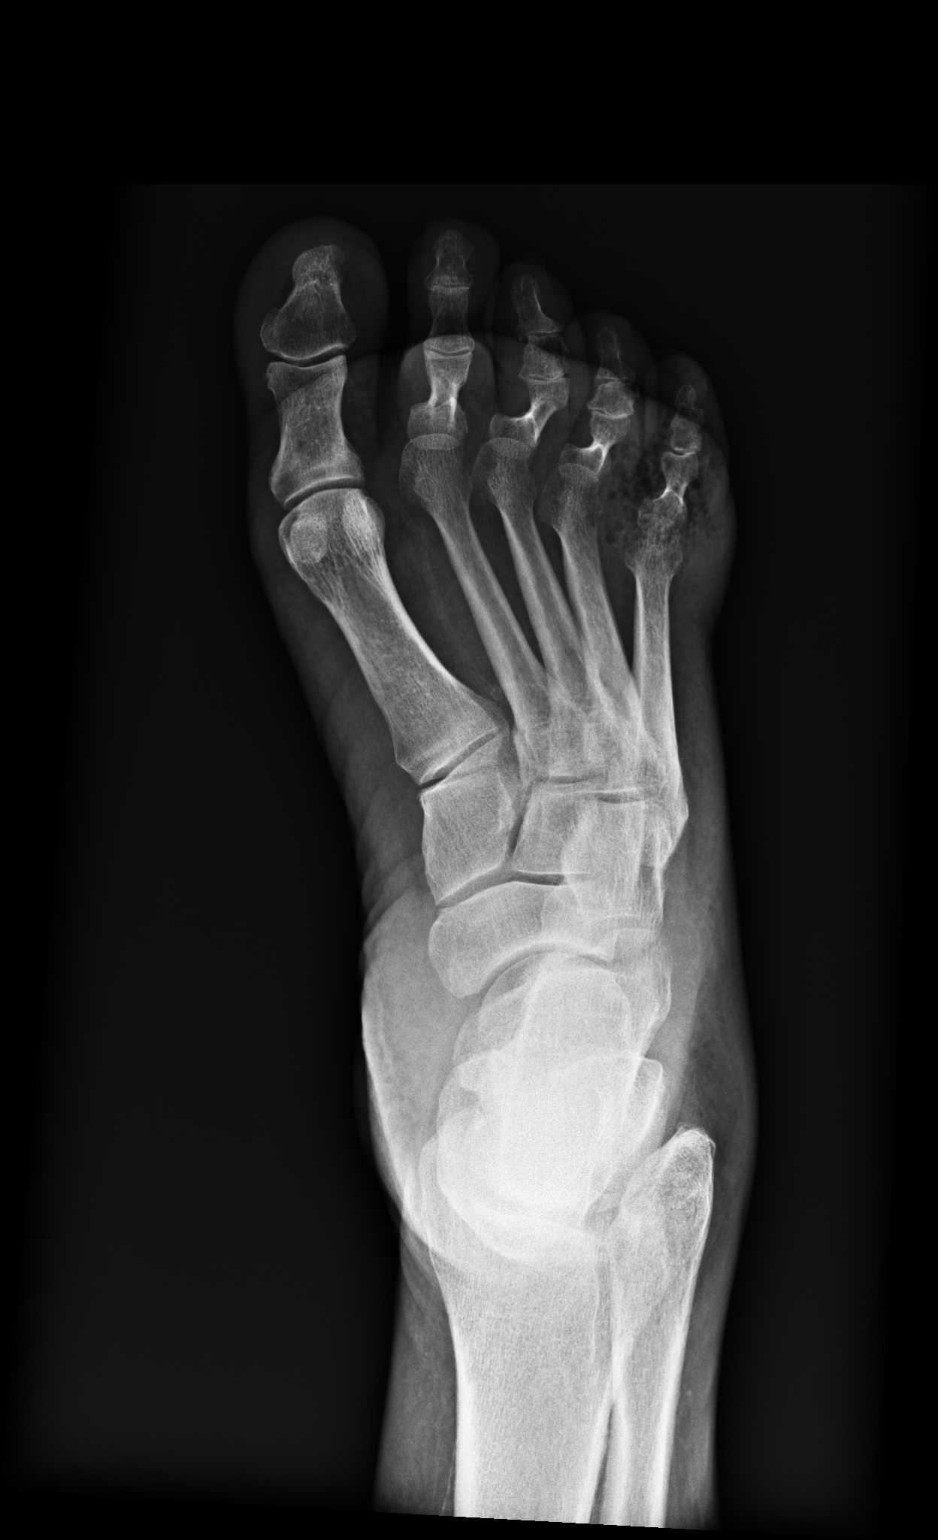
[im 2/2]
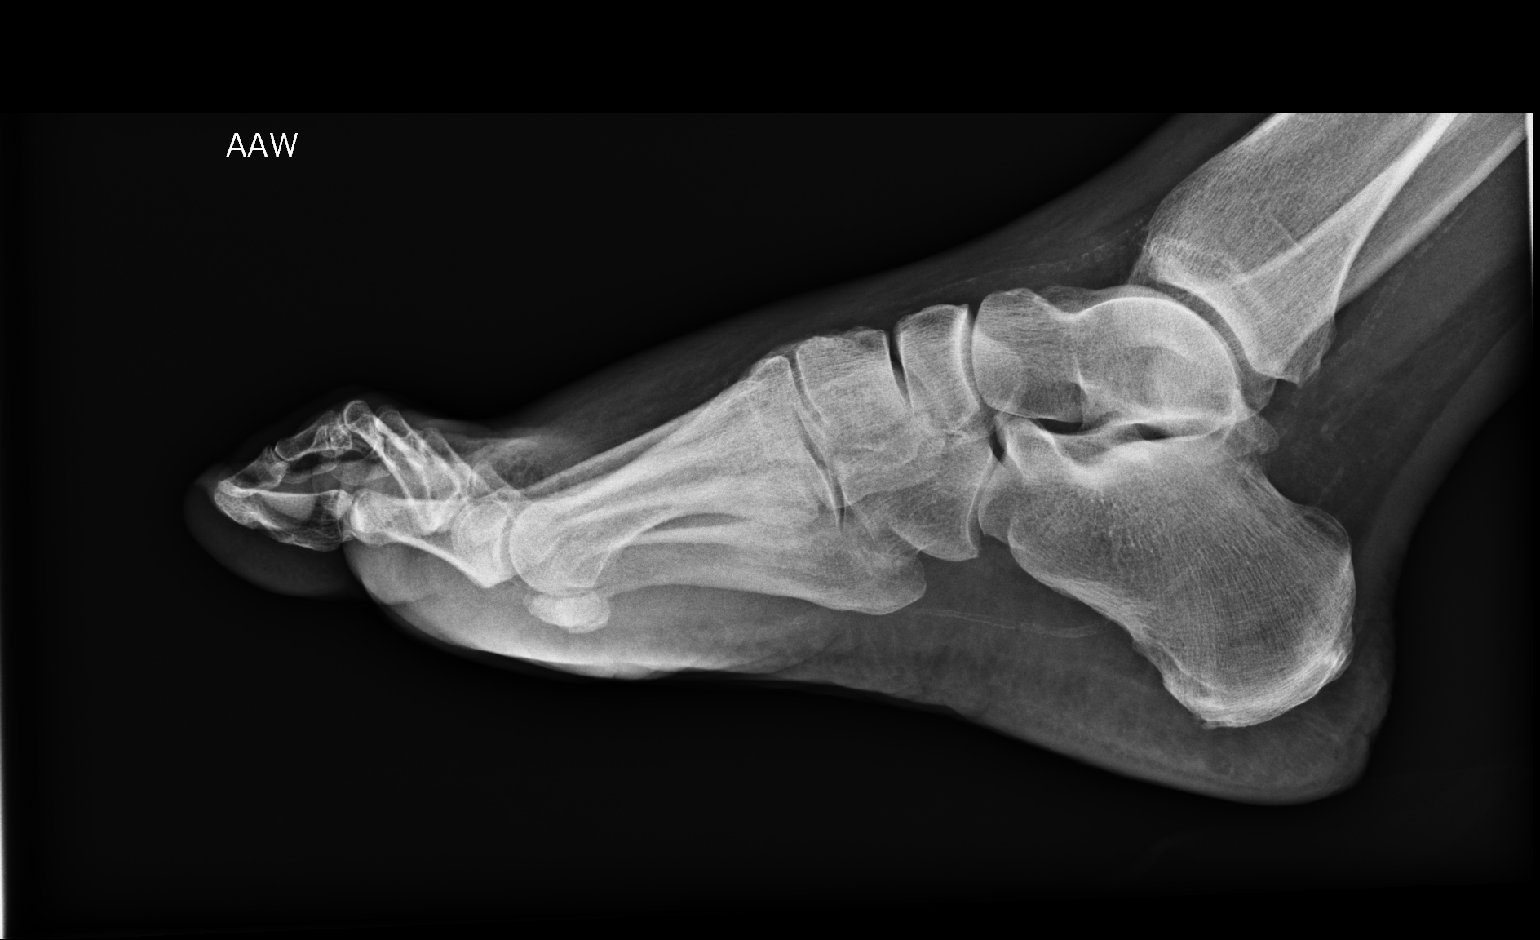

[2 of 2 positions shown; findings below may reference images not displayed]

PROCEDURE:     DXR - DXR FOOT RIGHT AP AND LATERAL  - August 07, 2011  [DATE]

RESULT:     AP and lateral views of the right foot are submitted. There is
prominent flexion at the proximal interphalangeal joints with prominent
extension at the metatarsophalangeal joint. There is soft tissue gas and
swelling associated with the PIP joint and proximal and middle phalanges of
the fifth toe. There do appear to be bony resorptive changes of the head of
the fifth metatarsal.
IMPRESSION: The findings are worrisome for osteomyelitis involving the
right fifth toe and distal aspect of the fifth metatarsal. There is
definitely overlying cellulitis.

[REDACTED]

## 2013-11-10 ENCOUNTER — Other Ambulatory Visit: Payer: Self-pay

## 2013-11-10 MED ORDER — HYDROCHLOROTHIAZIDE 25 MG PO TABS: 25.0000 mg | ORAL_TABLET | Freq: Every day | ORAL | Status: DC

## 2013-11-10 NOTE — Telephone Encounter (Signed)
Refill sent for HCTZ 25 mg  

## 2013-11-18 ENCOUNTER — Encounter: Payer: Self-pay | Admitting: Cardiovascular Disease

## 2013-11-18 ENCOUNTER — Ambulatory Visit (INDEPENDENT_AMBULATORY_CARE_PROVIDER_SITE_OTHER): Payer: Medicare HMO | Admitting: Cardiovascular Disease

## 2013-11-18 VITALS — BP 100/62 | HR 71 | Ht 70.0 in | Wt 257.5 lb

## 2013-11-18 DIAGNOSIS — I25810 Atherosclerosis of coronary artery bypass graft(s) without angina pectoris: Secondary | ICD-10-CM

## 2013-11-18 DIAGNOSIS — R609 Edema, unspecified: Secondary | ICD-10-CM

## 2013-11-18 DIAGNOSIS — E1165 Type 2 diabetes mellitus with hyperglycemia: Secondary | ICD-10-CM

## 2013-11-18 DIAGNOSIS — I10 Essential (primary) hypertension: Secondary | ICD-10-CM

## 2013-11-18 DIAGNOSIS — R0602 Shortness of breath: Secondary | ICD-10-CM

## 2013-11-18 DIAGNOSIS — E118 Type 2 diabetes mellitus with unspecified complications: Secondary | ICD-10-CM

## 2013-11-18 DIAGNOSIS — E785 Hyperlipidemia, unspecified: Secondary | ICD-10-CM

## 2013-11-18 DIAGNOSIS — IMO0002 Reserved for concepts with insufficient information to code with codable children: Secondary | ICD-10-CM

## 2013-11-18 NOTE — Assessment & Plan Note (Signed)
Recommended that he stay on his Crestor. Goal LDL less than 70

## 2013-11-18 NOTE — Patient Instructions (Addendum)
You are doing well. No medication changes were made.  Please re-sign up at the gym, Start walking daily  Please call us if you have new issues that need to be addressed before your next appt.  Your physician wants you to follow-up in: 6 months.  You will receive a reminder letter in the mail two months in advance. If you don't receive a letter, please call our office to schedule the follow-up appointment.  Your next appointment will be scheduled in our new office located at :  [facility]  [address] [place]  [place], [place] [address]

## 2013-11-18 NOTE — Progress Notes (Signed)
Patient ID: Benjamin Davidson, male    DOB: [birth year], 63 y.o.   MRN: [id number]  HPI Comments: 63 year old gentleman with a history of obesity, coronary artery disease, bypass surgery March 2008, poorly controlled diabetes, sedentary lifestyle,  history of DVT in the left leg,  on chronic warfarin who presents for routine followup. 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 He had gangrene of the fifth toe on the right with acute renal failure and dehydration admitted 08/07/2011    Mr. Santee states overall he is doing well.  In followup today, weight is down 11 pounds from his prior clinic visit. He is not doing any exercise at the gym. He was unaware that his weight was lower. He is trying to he better He does have a free gym membership but has not signed up to [employer] Sugars Typically  run around 200 Previously managed by Dr. Laurian Brim On previous visits had chronic lower extremity edema. This has resolved  Takes crestor 40 mg  In the morning with no complaints.   no recent lab work available from Dr. Hardin Negus last stress test July 2008 showing mild ischemia in the mid anteroseptal, apical anterior and apical septal region. Ejection fraction 40%. This test was done post bypass. No cardiac catheterization was done in followup as he had no symptoms.    EKG shows normal sinus rhythm with rate of 71 beats per minute, left bundle branch block.     Outpatient Encounter Prescriptions as of 11/18/2013  Medication Sig  . aspirin EC 325 MG EC tablet Take 325 mg by mouth daily.    . carvedilol (COREG) 6.25 MG tablet Take 1 tablet (6.25 mg total) by mouth 2 (two) times daily.  Marland Kitchen DEVILS CLAW PO Take by mouth daily.  . furosemide (LASIX) 20 MG tablet Take one tablet by mouth every day. If increased swelling, may take one additional tablet daily as needed.  . gabapentin (NEURONTIN) 300 MG capsule Take 300  mg by mouth 2 (two) times daily.   . hydrochlorothiazide (HYDRODIURIL) 25 MG tablet Take 1 tablet (25 mg total) by mouth daily.  . insulin NPH-insulin regular (NOVOLIN 70/30) (70-30) 100 UNIT/ML injection Inject 35 Units into the skin 2 (two) times daily with a meal.    . Insulin Pen Needle (NOVOFINE 31) 31G X 6 MM MISC by Does not apply route.    . Insulin Pen Needle (NOVOFINE) 32G X 6 MM MISC by Does not apply route.    Marland Kitchen losartan-hydrochlorothiazide (HYZAAR) 100-12.5 MG per tablet Take 1 tablet by mouth daily.  Marland Kitchen NOVOLIN R 100 UNIT/ML injection as needed.   . rosuvastatin (CRESTOR) 40 MG tablet Take 1 tablet (40 mg total) by mouth at bedtime.  . sertraline (ZOLOFT) 100 MG tablet Take 100 mg by mouth daily.    Marland Kitchen warfarin (COUMADIN) 5 MG tablet TAKE ONE TABLET BY MOUTH AS DIRECTED   Review of Systems  Constitutional: Negative.   HENT: Negative.   Eyes: Negative.   Respiratory: Negative.   Cardiovascular: Negative.   Gastrointestinal: Negative.   Endocrine: Negative.   Musculoskeletal: Positive for gait problem.  Skin: Negative.   Allergic/Immunologic: Negative.   Neurological: Negative.   Hematological: Negative.   Psychiatric/Behavioral: Negative.   All other systems reviewed and are negative.   BP 100/62  Pulse 71  Ht 5\' 10"  (1.778 m)  Wt 257 lb 8 oz (116.801 kg)  BMI 36.95 kg/m2  Physical Exam  Nursing note and vitals reviewed. Constitutional: He is oriented to person, place, and time. He appears well-developed and well-nourished.  Obese  HENT:  Head: Normocephalic.  Nose: Nose normal.  Mouth/Throat: Oropharynx is clear and moist.  Eyes: Conjunctivae are normal. Pupils are equal, round, and reactive to light.  Neck: Normal range of motion. Neck supple. No JVD present.  Cardiovascular: Normal rate, regular rhythm, S1 normal, S2 normal, normal heart sounds and intact distal pulses.  Exam reveals no gallop and no friction rub.   No murmur heard. Pulmonary/Chest: Effort  normal and breath sounds normal. No respiratory distress. He has no wheezes. He has no rales. He exhibits no tenderness.  Abdominal: Soft. Bowel sounds are normal. He exhibits no distension. There is no tenderness.  Musculoskeletal: Normal range of motion. He exhibits no edema and no tenderness.  Lymphadenopathy:    He has no cervical adenopathy.  Neurological: He is alert and oriented to person, place, and time. Coordination normal.  Skin: Skin is warm and dry. No rash noted. No erythema.  Psychiatric: He has a normal mood and affect. His behavior is normal. Judgment and thought content normal.      Assessment and Plan

## 2013-11-18 NOTE — Assessment & Plan Note (Signed)
Blood pressure is well controlled on today's visit. No changes made to the medications. 

## 2013-11-18 NOTE — Assessment & Plan Note (Signed)
We have encouraged continued exercise, careful diet management in an effort to lose weight. 

## 2013-11-18 NOTE — Assessment & Plan Note (Signed)
Currently with no symptoms of angina. No further workup at this time. Continue current medication regimen. 

## 2013-11-18 NOTE — Assessment & Plan Note (Signed)
Edema has resolved on his visit today. No medication changes made

## 2013-11-19 ENCOUNTER — Ambulatory Visit (INDEPENDENT_AMBULATORY_CARE_PROVIDER_SITE_OTHER): Payer: Medicare HMO | Admitting: Internal Medicine

## 2013-11-19 ENCOUNTER — Encounter: Payer: Self-pay | Admitting: Internal Medicine

## 2013-11-19 VITALS — BP 122/70 | HR 68 | Temp 97.8°F | Ht 70.0 in | Wt 257.0 lb

## 2013-11-19 DIAGNOSIS — E1122 Type 2 diabetes mellitus with diabetic chronic kidney disease: Secondary | ICD-10-CM

## 2013-11-19 DIAGNOSIS — E785 Hyperlipidemia, unspecified: Secondary | ICD-10-CM

## 2013-11-19 DIAGNOSIS — N183 Chronic kidney disease, stage 3 unspecified: Secondary | ICD-10-CM

## 2013-11-19 DIAGNOSIS — Z23 Encounter for immunization: Secondary | ICD-10-CM

## 2013-11-19 DIAGNOSIS — E1159 Type 2 diabetes mellitus with other circulatory complications: Secondary | ICD-10-CM

## 2013-11-19 DIAGNOSIS — I10 Essential (primary) hypertension: Secondary | ICD-10-CM

## 2013-11-19 DIAGNOSIS — I82409 Acute embolism and thrombosis of unspecified deep veins of unspecified lower extremity: Secondary | ICD-10-CM | POA: Insufficient documentation

## 2013-11-19 DIAGNOSIS — F3340 Major depressive disorder, recurrent, in remission, unspecified: Secondary | ICD-10-CM | POA: Insufficient documentation

## 2013-11-19 DIAGNOSIS — Z89429 Acquired absence of other toe(s), unspecified side: Secondary | ICD-10-CM

## 2013-11-19 DIAGNOSIS — Z125 Encounter for screening for malignant neoplasm of prostate: Secondary | ICD-10-CM

## 2013-11-19 DIAGNOSIS — E1151 Type 2 diabetes mellitus with diabetic peripheral angiopathy without gangrene: Secondary | ICD-10-CM

## 2013-11-19 DIAGNOSIS — Z1211 Encounter for screening for malignant neoplasm of colon: Secondary | ICD-10-CM

## 2013-11-19 DIAGNOSIS — I739 Peripheral vascular disease, unspecified: Secondary | ICD-10-CM | POA: Insufficient documentation

## 2013-11-19 DIAGNOSIS — I82402 Acute embolism and thrombosis of unspecified deep veins of left lower extremity: Secondary | ICD-10-CM

## 2013-11-19 DIAGNOSIS — E1165 Type 2 diabetes mellitus with hyperglycemia: Principal | ICD-10-CM

## 2013-11-19 DIAGNOSIS — IMO0002 Reserved for concepts with insufficient information to code with codable children: Secondary | ICD-10-CM | POA: Insufficient documentation

## 2013-11-19 LAB — CBC WITH DIFFERENTIAL/PLATELET
BASOS PCT: 0.5 % (ref 0.0–3.0)
Basophils Absolute: 0 10*3/uL (ref 0.0–0.1)
EOS PCT: 1.6 % (ref 0.0–5.0)
Eosinophils Absolute: 0.1 10*3/uL (ref 0.0–0.7)
HEMATOCRIT: 36.5 % — AB (ref 39.0–52.0)
Hemoglobin: 12 g/dL — ABNORMAL LOW (ref 13.0–17.0)
Lymphocytes Relative: 21.2 % (ref 12.0–46.0)
Lymphs Abs: 1.6 10*3/uL (ref 0.7–4.0)
MCHC: 32.8 g/dL (ref 30.0–36.0)
MCV: 81.1 fl (ref 78.0–100.0)
MONOS PCT: 6.2 % (ref 3.0–12.0)
Monocytes Absolute: 0.5 10*3/uL (ref 0.1–1.0)
NEUTROS PCT: 70.5 % (ref 43.0–77.0)
Neutro Abs: 5.4 10*3/uL (ref 1.4–7.7)
Platelets: 287 10*3/uL (ref 150.0–400.0)
RBC: 4.5 Mil/uL (ref 4.22–5.81)
RDW: 14.7 % (ref 11.5–15.5)
WBC: 7.7 10*3/uL (ref 4.0–10.5)

## 2013-11-19 LAB — HEMOGLOBIN A1C: Hgb A1c MFr Bld: 8.7 % — ABNORMAL HIGH (ref 4.6–6.5)

## 2013-11-19 LAB — HM DIABETES FOOT EXAM

## 2013-11-19 NOTE — Assessment & Plan Note (Signed)
BP Readings from Last 3 Encounters:  11/19/13 122/70  11/18/13 100/62  05/21/13 112/72   Control is good

## 2013-11-19 NOTE — Progress Notes (Signed)
Subjective:    Patient ID: Benjamin Davidson, male    DOB: 1950-06-10, 63 y.o.   MRN: [id number]  HPI Here with niece who helps with his care (paper work, bills and finances). Mom had been power of attorney but not able to now  Establishing here--had been seeing Dr Hardin Negus Cardiologist Dr Rockey Situ  He is disabled since DVT some years ago and CABG Lives alone Still drives--does shopping and all instrumental ADLs  Diabetes noted at around 1989 Oral Rx at first and now insulin Checks sugars bid and adjusts insulin--- 150-200 mostly Getting shots in eyes for blood vessel bleeding (?retinopathy) May have low sugar reaction infrequently. No neuroglycopenia (just jittery)  Amputation on left foot after gangrenous changes--5th toe Right 4th and 5th have also been amputated Had stent on right Follows with Dr Mayra Neer podiatrist at [facility] No pain in feet  DVT some years ago (?2003) Still on coumadin since then due to high risk  No chest pain recently No exercise lately Has lost a little weight recently  Current Outpatient Prescriptions on File Prior to Visit  Medication Sig Dispense Refill  . aspirin EC 325 MG EC tablet Take 325 mg by mouth daily.        . carvedilol (COREG) 6.25 MG tablet Take 1 tablet (6.25 mg total) by mouth 2 (two) times daily.  180 tablet  3  . DEVILS CLAW PO Take by mouth daily.      . furosemide (LASIX) 20 MG tablet Take one tablet by mouth every day. If increased swelling, may take one additional tablet daily as needed.  60 tablet  5  . gabapentin (NEURONTIN) 300 MG capsule Take 300 mg by mouth 2 (two) times daily.       . hydrochlorothiazide (HYDRODIURIL) 25 MG tablet Take 1 tablet (25 mg total) by mouth daily.  30 tablet  6  . insulin NPH-insulin regular (NOVOLIN 70/30) (70-30) 100 UNIT/ML injection Inject 35 Units into the skin 2 (two) times daily with a meal.        . Insulin Pen Needle (NOVOFINE 31) 31G X 6 MM MISC by Does not apply route.        Marland Kitchen  losartan-hydrochlorothiazide (HYZAAR) 100-12.5 MG per tablet Take 1 tablet by mouth daily.  90 tablet  3  . NOVOLIN R 100 UNIT/ML injection as needed.       . rosuvastatin (CRESTOR) 40 MG tablet Take 1 tablet (40 mg total) by mouth at bedtime.  90 tablet  3  . sertraline (ZOLOFT) 100 MG tablet Take 100 mg by mouth daily.        Marland Kitchen warfarin (COUMADIN) 5 MG tablet TAKE ONE TABLET BY MOUTH AS DIRECTED  120 tablet  0   No current facility-administered medications on file prior to visit.    Allergies  Allergen Reactions  . Lisinopril     Cough Flu like symptoms     Past Medical History  Diagnosis Date  . Pure hypercholesterolemia   . MDD (recurrent major depressive disorder) in remission   . Left leg DVT   . Coronary artery disease   . DM (diabetes mellitus), type 2, uncontrolled, periph vascular complic     Type II  . Hypertension   . CHF (congestive heart failure)   . Peripheral vascular disease     Left toe amputations. Dr Lucky Cowboy    Past Surgical History  Procedure Laterality Date  . Coronary artery bypass graft    . Toe amputation  left 5th, right 4th and 5th  . Percutaneous stent intervention      right leg  . Peripherally inserted central catheter insertion  2013    Family History  Problem Relation Age of Onset  . Hyperlipidemia Brother     History   Social History  . Marital Status: Divorced    Spouse Name: N/A    Number of Children: 0  . Years of Education: N/A   Occupational History  . [name]     Disabled since 2000's   Social History Main Topics  . Smoking status: Never Smoker   . Smokeless tobacco: Never Used  . Alcohol Use: No  . Drug Use: No  . Sexual Activity: Not on file   Other Topics Concern  . Not on file   Social History Narrative  . No narrative on file   Review of Systems  Constitutional: Negative for fatigue.       Wears seat belt  HENT: Negative for dental problem, hearing loss and tinnitus.        Overdue for dentist    Eyes: Positive for visual disturbance.  Respiratory: Positive for shortness of breath. Negative for cough.        Very easy DOE-- even taking a shower at times  Cardiovascular: Positive for palpitations. Negative for chest pain and leg swelling.       Rare skipped beat  Gastrointestinal: Negative for nausea, vomiting, abdominal pain and blood in stool.  Endocrine: Negative for polydipsia and polyuria.  Genitourinary: Negative for urgency and difficulty urinating.  Musculoskeletal: Positive for arthralgias. Negative for joint swelling.       Left hip pain-Devil's claw helped  Skin: Negative for rash.       No foot ulcers Mass on right forehead--- lipoma probably. Long standing  Allergic/Immunologic: Positive for environmental allergies. Negative for immunocompromised state.  Neurological: Positive for light-headedness. Negative for dizziness, syncope, weakness and headaches.  Psychiatric/Behavioral: Negative for sleep disturbance and dysphoric mood. The patient is not nervous/anxious.        Objective:   Physical Exam  Constitutional: He appears well-developed. No distress.  HENT:  Mouth/Throat: Oropharynx is clear and moist. No oropharyngeal exudate.  Neck: Normal range of motion. Neck supple. No thyromegaly present.  Cardiovascular: Normal rate, regular rhythm and normal heart sounds.  Exam reveals no gallop.   No murmur heard. Faint pulse in right foot, no pulse on left  Pulmonary/Chest: Effort normal and breath sounds normal. No respiratory distress. He has no wheezes. He has no rales.  Abdominal: Soft. There is no tenderness.  Musculoskeletal: He exhibits no edema and no tenderness.  Lymphadenopathy:    He has no cervical adenopathy.  Skin: No rash noted. No erythema.  Amputations sites are clean Apparent lipoma on right forearm  Psychiatric: He has a normal mood and affect. His behavior is normal.          Assessment & Plan:

## 2013-11-19 NOTE — Assessment & Plan Note (Signed)
Follows with vascular and podiatry

## 2013-11-19 NOTE — Addendum Note (Signed)
Addended by: Despina Hidden on: 11/19/2013 12:51 PM   Modules accepted: Orders

## 2013-11-19 NOTE — Progress Notes (Signed)
Pre visit review using our clinic review tool, if applicable. No additional management support is needed unless otherwise documented below in the visit note. 

## 2013-11-19 NOTE — Assessment & Plan Note (Signed)
Doing well on his med Probably needs to continue indefinitely

## 2013-11-19 NOTE — Assessment & Plan Note (Signed)
Apparently high risk so still on coumadin

## 2013-11-19 NOTE — Assessment & Plan Note (Signed)
I suspect his control is poor Discussed endocrine Will refer if over 9% Add metformin if renal function okay

## 2013-11-19 NOTE — Assessment & Plan Note (Signed)
Procedure on right Feet look good No claudication

## 2013-11-19 NOTE — Assessment & Plan Note (Signed)
Will recheck On losartan

## 2013-11-19 NOTE — Assessment & Plan Note (Signed)
No problems with statin 

## 2013-11-20 ENCOUNTER — Telehealth: Payer: Self-pay | Admitting: Internal Medicine

## 2013-11-20 LAB — COMPREHENSIVE METABOLIC PANEL
ALBUMIN: 3.6 g/dL (ref 3.5–5.2)
ALK PHOS: 57 U/L (ref 39–117)
ALT: 26 U/L (ref 0–53)
AST: 21 U/L (ref 0–37)
BUN: 29 mg/dL — AB (ref 6–23)
CO2: 29 mEq/L (ref 19–32)
Calcium: 9.1 mg/dL (ref 8.4–10.5)
Chloride: 100 mEq/L (ref 96–112)
Creatinine, Ser: 1.4 mg/dL (ref 0.4–1.5)
GFR: 55.51 mL/min — AB (ref >60.00)
Glucose, Bld: 207 mg/dL — ABNORMAL HIGH (ref 70–99)
Potassium: 3.7 mEq/L (ref 3.5–5.1)
Sodium: 136 mEq/L (ref 135–145)
Total Bilirubin: 0.7 mg/dL (ref 0.2–1.2)
Total Protein: 7.2 g/dL (ref 6.0–8.3)

## 2013-11-20 LAB — LIPID PANEL
Cholesterol: 121 mg/dL (ref 0–200)
HDL: 32.8 mg/dL — AB (ref >39.00)
LDL Cholesterol: 63 mg/dL (ref 0–99)
NonHDL: 88.2
TRIGLYCERIDES: 124 mg/dL (ref 0.0–149.0)
Total CHOL/HDL Ratio: 4
VLDL: 24.8 mg/dL (ref 0.0–40.0)

## 2013-11-20 LAB — T4, FREE: FREE T4: 0.84 ng/dL (ref 0.60–1.60)

## 2013-11-20 LAB — PSA: PSA: 0.16 ng/mL (ref 0.10–4.00)

## 2013-11-20 NOTE — Telephone Encounter (Signed)
emmi mailed  °

## 2013-11-25 ENCOUNTER — Encounter: Payer: Self-pay | Admitting: *Deleted

## 2013-11-25 ENCOUNTER — Other Ambulatory Visit: Payer: Self-pay | Admitting: *Deleted

## 2013-11-25 MED ORDER — METFORMIN HCL 500 MG PO TABS: 500.0000 mg | ORAL_TABLET | Freq: Two times a day (BID) | ORAL | Status: DC

## 2013-12-11 ENCOUNTER — Other Ambulatory Visit: Payer: Self-pay

## 2013-12-11 MED ORDER — ROSUVASTATIN CALCIUM 40 MG PO TABS: 40.0000 mg | ORAL_TABLET | Freq: Every day | ORAL | Status: DC

## 2013-12-11 NOTE — Telephone Encounter (Signed)
Refill sent for crestor.  

## 2013-12-30 ENCOUNTER — Telehealth: Payer: Self-pay | Admitting: *Deleted

## 2013-12-30 NOTE — Telephone Encounter (Signed)
Form faxed

## 2013-12-30 NOTE — Telephone Encounter (Signed)
Form for diabetic testing supplies in your IN box for completion. 

## 2013-12-30 NOTE — Telephone Encounter (Signed)
Form done 

## 2014-02-04 ENCOUNTER — Other Ambulatory Visit: Payer: Self-pay

## 2014-02-04 MED ORDER — CARVEDILOL 6.25 MG PO TABS: 6.2500 mg | ORAL_TABLET | Freq: Two times a day (BID) | ORAL | Status: DC

## 2014-02-04 MED ORDER — LOSARTAN POTASSIUM-HCTZ 100-12.5 MG PO TABS: 1.0000 | ORAL_TABLET | Freq: Every day | ORAL | Status: DC

## 2014-02-04 NOTE — Telephone Encounter (Signed)
Refill sent for carvedilol.  

## 2014-02-04 NOTE — Telephone Encounter (Signed)
Refill sent for losartan hctz

## 2014-02-17 ENCOUNTER — Other Ambulatory Visit: Payer: Self-pay | Admitting: *Deleted

## 2014-02-17 MED ORDER — FUROSEMIDE 20 MG PO TABS: ORAL_TABLET | ORAL | Status: DC

## 2014-03-03 ENCOUNTER — Telehealth: Payer: Self-pay

## 2014-03-03 NOTE — Telephone Encounter (Signed)
Please set up INR here ASAP!!!!

## 2014-03-03 NOTE — Telephone Encounter (Signed)
Betsy pts care giver said pt established care with Dr Silvio Pate on 11/19/13 and pt has not had INR ck. Pt is presently taking Coumadin 5 mg once daily. Pt was started on Coumadin by Dr Rockey Situ years ago; Dr Hardin Negus pts previous PCP did INR cks. Last INR checked was 6 months or more; Gwinda Passe is not sure of last INR. Gwinda Passe said she did not think about asking for INR ck for coumadin at 11/19/13 appt.Gwinda Passe is on DPR to discuss pts care.Betsy request cb.

## 2014-03-03 NOTE — Telephone Encounter (Signed)
Betsy notified as instructed by telephone and verbalized understanding. Appointment scheduled for 03/04/14 and Gwinda Passe stated that she will make sure that patient is here as instructed,.

## 2014-03-04 ENCOUNTER — Other Ambulatory Visit (INDEPENDENT_AMBULATORY_CARE_PROVIDER_SITE_OTHER): Payer: Medicare HMO

## 2014-03-04 DIAGNOSIS — Z7901 Long term (current) use of anticoagulants: Secondary | ICD-10-CM

## 2014-03-04 DIAGNOSIS — I82409 Acute embolism and thrombosis of unspecified deep veins of unspecified lower extremity: Secondary | ICD-10-CM

## 2014-03-04 LAB — POCT INR: INR: 1.9

## 2014-04-02 ENCOUNTER — Other Ambulatory Visit (INDEPENDENT_AMBULATORY_CARE_PROVIDER_SITE_OTHER): Payer: Medicare HMO

## 2014-04-02 ENCOUNTER — Telehealth: Payer: Self-pay

## 2014-04-02 DIAGNOSIS — I82409 Acute embolism and thrombosis of unspecified deep veins of unspecified lower extremity: Secondary | ICD-10-CM

## 2014-04-02 DIAGNOSIS — Z7901 Long term (current) use of anticoagulants: Secondary | ICD-10-CM

## 2014-04-02 LAB — POCT INR: INR: 1.8

## 2014-04-02 NOTE — Telephone Encounter (Signed)
-----   Message from Venia Carbon, MD sent at 04/02/2014  2:48 PM EST ----- Please call him protime still not quite therapeutic Have him take 7.5mg  daily for the next 3 days (1.5 of the 5mg  tabs). Then take 7.5mg  on Mon/Wed/Fri and 5 mg the other days. Recheck in 1 month Send new Rx with increased amount if needed

## 2014-04-02 NOTE — Telephone Encounter (Signed)
Patient informed of new medication directions.  He wrote them down and repeated them.  Appointment also made.

## 2014-04-20 LAB — HM DIABETES EYE EXAM

## 2014-04-23 ENCOUNTER — Encounter: Payer: Self-pay | Admitting: Internal Medicine

## 2014-05-07 ENCOUNTER — Other Ambulatory Visit (INDEPENDENT_AMBULATORY_CARE_PROVIDER_SITE_OTHER): Payer: Medicare HMO

## 2014-05-07 DIAGNOSIS — Z7901 Long term (current) use of anticoagulants: Secondary | ICD-10-CM

## 2014-05-07 DIAGNOSIS — I82409 Acute embolism and thrombosis of unspecified deep veins of unspecified lower extremity: Secondary | ICD-10-CM

## 2014-05-07 LAB — POCT INR: INR: 2.3

## 2014-05-08 ENCOUNTER — Encounter: Payer: Self-pay | Admitting: *Deleted

## 2014-05-12 ENCOUNTER — Other Ambulatory Visit: Payer: Self-pay

## 2014-05-12 NOTE — Telephone Encounter (Signed)
Okay to refill for a year 

## 2014-05-12 NOTE — Telephone Encounter (Signed)
Pt said warfarin instructions had been changed and request refill warfarin to [facility].Please advise.

## 2014-05-13 MED ORDER — WARFARIN SODIUM 5 MG PO TABS: ORAL_TABLET | ORAL | Status: DC

## 2014-05-13 NOTE — Telephone Encounter (Signed)
It looks like no refills were sent---should be refilled for a year, then we can make adjustments if needed

## 2014-05-19 NOTE — Discharge Summary (Signed)
PATIENT NAME:  Benjamin Davidson, Benjamin Davidson MR#:  [id number] DATE OF BIRTH:  [birth year]  DATE OF ADMISSION:  08/07/2011 DATE OF DISCHARGE:  08/18/2011  Please see dictated interim discharge summary by Dr. Benjie Karvonen on 08/12/2011 for more details from admission until 08/12/2011 after which patient was waiting to get angiogram which he had on 08/16/2011.   DISCHARGE DIAGNOSES: Remain same as dictated by Dr. Benjie Karvonen plus: Postoperative anemia likely acute blood loss from procedure but remains hemodynamically stable. No need of blood transfusion.   SECONDARY DIAGNOSES: As dictated in history and physical by Dr. Doy Hutching.   CONSULTATIONS: Remained same as dictated by Dr. Benjie Karvonen.   PROCEDURES:   1. Right PICC line placed by Dr. Lucky Cowboy on 08/14/2011.  2. Angiogram done on 08/16/2011 by Dr. Delana Meyer in the right lower extremity with abdominal aortogram with PTA to 2.7 mm right anterior tibial artery.   LABORATORY, DIAGNOSTIC AND RADIOLOGICAL DATA: No new labs were obtained during this period except routine CBCs which showed low hemoglobin but was stable.   HISTORY AND SHORT HOSPITAL COURSE: Patient is a 64 year old male with above-mentioned medical problems was admitted for right foot osteomyelitis. Please see Dr. Gardiner Coins dictated interim discharge summary on 08/12/2011 for further details which includes most of the course. Patient was waiting to get angiogram done which was performed on 08/16/2011 with results dictated above. Patient was on heparin drip during those 48 hours and Coumadin was held. patient has been doing well. He was watched one more night yesterday as his hemoglobin and hematocrit were low with hemoglobin of 7.1 yesterday but it is better with hemoglobin of 7.5 today and is being discharged to [facility] in stable condition with wound VAC in place.   PHYSICAL EXAMINATION: VITAL SIGNS: On the date of discharge his vital signs are as follows: Temperature 98.2, heart rate 66 per minute, respirations 20 per  minute, blood pressure 176/80 mmHg. He is saturating 98% on room air. Pertinent Physical Examination on the date of discharge: CARDIOVASCULAR: S1, S2 normal. No murmurs, rubs, or gallop. LUNGS: Clear to auscultation bilaterally. No wheezing, rales, rhonchi, crepitation. ABDOMEN: Soft, benign. NEUROLOGIC: Nonfocal examination. EXTREMITIES: Right foot with minimal erythema. Wound VAC intact and had good peripheral circulation in the right lower extremity. Minimal drainage in the wound VAC. Left groin access site bandage is clean. No hematoma. All other physical examination remained at the baseline.   DISCHARGE MEDICATIONS:  1. Lasix 20 mg p.o. daily as needed.  2. Humulin 70/30, 35 units sub-Q b.i.d.  3. Hydrochlorothiazide 25 mg p.o. daily.  4. Warfarin 5 mg p.o. daily. 5. Micardis HCTZ 80/12.5 mg, 1 tablet p.o. daily.  6. Crestor 20 mg p.o. daily.  7. Sertraline 100 mg p.o. daily.  8. Bystolic 10 mg p.o. daily. 9. Aspirin 325 mg p.o. daily.  10. Tylenol 650 mg p.o. every four hours as needed.  11. Hydralazine 10 mg p.o. 4 times a day.  12. Rocephin 2 grams IV daily, to be stopped on 09/22/2011.    DISCHARGE DIET: Low sodium, low fat, low cholesterol, 1800 ADA.   DISCHARGE ACTIVITY: As tolerated.   DISCHARGE INSTRUCTIONS AND FOLLOW UP: Patient was instructed to follow up with his primary care physician, Dr. Laurian Brim, in 1 to 2 weeks. He will need follow up with Dr. Vickki Muff in two weeks, with Dr. Clayborn Bigness in 4 to 6 weeks, with Dr. Rockey Situ from cardiology in 4 to 6 weeks, with Dr. Delana Meyer in 6 to 8 weeks. He will get physical therapy  evaluation and management while at the facility. He will need PT-INR check on Monday, 08/21/2011, and Wednesday, 08/23/2011, with results forwarded to Dr. Donivan Scull office for Coumadin dose adjustment.        TOTAL TIME DISCHARGING THIS PATIENT: 55 minutes.   ____________________________ Lucina Mellow. Manuella Ghazi, MD vss:cms D: 08/18/2011 11:23:40 ET T: 08/18/2011  11:38:39 ET JOB#: [id number]  cc: Elain Wixon S. Manuella Ghazi, MD, <Dictator> Morton Peters., MD Pete Glatter Vickki Muff, DPM Heinz Knuckles Blocker, MD Katha Cabal, MD Minna Merritts, MD  Lucina Mellow [facility] MD ELECTRONICALLY SIGNED 08/20/2011 1:29

## 2014-05-19 NOTE — Op Note (Signed)
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  PROCEDURES PERFORMED:  1. Right lower extremity distal runoff, third order catheter placement.  2. Percutaneous transluminal angioplasty of the right anterior tibial artery to 2.7 mm in diameter.   SURGEON: Katha Cabal, M.D.   SEDATION: Versed 3 mg plus fentanyl 100 mcg administered IV. Continuous ECG, pulse oximetry and cardiopulmonary monitoring was performed throughout the entire procedure by the interventional radiology nurse. Total sedation time was one hour.   ACCESS: 5 French sheath, left common femoral artery.   FLUORO TIME: 6.6 minutes.   CONTRAST USED: Isovue 70 mL.   INDICATIONS: Mr. Yoss is a 64 year old gentleman who presented to the hospital ER with diabetic foot abscess and gangrenous changes. He has undergone multiple debridements and amputation of the fourth and fifth rays. He now has an open wound and will require optimal perfusion for wound healing, pedal pulses are nonpalpable, and he is therefore undergoing angiography with the intention for intervention. The risks and benefits were reviewed, all questions are answered, the patient agrees to proceed.   DESCRIPTION OF PROCEDURE: The patient is taken to special procedures and placed in the supine position. After adequate sedation is achieved, the left groin is prepped and draped in a sterile fashion. Ultrasound is placed in a sterile sleeve. Ultrasound is utilized secondary to lack of appropriate landmarks and to avoid vascular injury. Under direct ultrasound visualization, the common femoral artery is identified. It is  echolucent and pulsatile indicating patency. Image is recorded. Micropuncture needle is used to access the anterior wall and direct ultrasound visualization, microwire followed by microsheath, J-wire followed by 5 [place] sheath, and 5 French pigtail catheter. Pigtail catheter is then positioned at the level of T12 and AP projection of the aorta is obtained. Pigtail catheter is repositioned to above the bifurcation and LAO projection of the iliac system is obtained. A stiff-angled Glidewire and pigtail catheter are then negotiated into the SFA and distal runoff is obtained. Images demonstrate tibial disease and 4000 units of heparin is given. The stiff-angled Glidewire is reintroduced. The pigtail catheter and 5 French sheath are removed and a 5 French RAABE sheath is advanced up and over the aortic bifurcation and down with its tip sitting in the proximal one third of the SFA. Using an angled catheter and a V18 wire, as well as Platinum Plus wire, the catheter is negotiated into the anterior tibial and the multiple lesions are crossed. Hand injection of contrast in the catheter demonstrates the distal anterior tibial and pedal runoff. This represents third order catheter placement. The 18 wire is reintroduced and a 2.5 x 10 Fox balloon is advanced across the lesions beginning distally. Two serial inflations are performed, both to 20 atmospheres, which inflate the Fox balloon to a total diameter of 2.7. Follow-up angiography demonstrates wide patency of the anterior tibial with approximately 15% residual stenosis. This does not appear flow-limiting and is a tremendous improvement over the preangioplasty status. Given the nature of tibial disease, it seemed prudent to try to inflate this area to 38 mm with such a nice result and therefore the procedure was terminated. The sheath is pulled into the left external iliac and the oblique view is obtained, Perclose device  is deployed successfully, and there are no immediate  complications.   INTERPRETATION: The abdominal aorta, bilateral common iliacs and external iliac arteries are widely patent.   The right common femoral and profunda femoris are widely patent. Superficial femoral artery and popliteal arteries are widely patent. Beginning in the tibial with the tibial vessels there is occlusion of the posterior tibial at the origin and the posterior tibial remains occluded throughout its course. The lateral plantar never fills. Peroneal is patent throughout its course but is quite small and appears to terminate with Spartan collaterals that do not do a very good job of crossing the ankle. Anterior tibial is widely patent in its proximal one third and in its middle to distal one thirds there are three serial stenoses of greater than 90%. Distally the anterior tibial artery is widely patent and fills the dorsalis pedis which fills the pedal arch. Following angioplasty, as noted above, there is moderate stenosis but this does not appear to be flow limiting measuring approximately 15% at one of the lesions. The other two are widely patent with less than 5% residual stenosis. This appears to be a most satisfactory result.   SUMMARY: Successful recanalization of the anterior tibial on the right side for limb salvage.  ____________________________ Katha Cabal, MD ggs:slb D: 08/17/2011 19:00:00 ET T: 08/18/2011 11:03:21 ET JOB#: [id number]  cc: Heinz Knuckles. Blocker, MD Larkin Ina A. Vickki Muff, DPM Morton Peters., MD Katha Cabal, MD, <Dictator>  Katha Cabal MD ELECTRONICALLY SIGNED 08/24/2011 12:25

## 2014-05-19 NOTE — Op Note (Signed)
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  PROCEDURES PERFORMED:  1. Right lower extremity distal runoff, third order catheter placement.  2. Percutaneous transluminal angioplasty of the right anterior tibial artery to 2.7 mm in diameter.   SURGEON: Katha Cabal, M.D.   SEDATION: Versed 3 mg plus fentanyl 100 mcg administered IV. Continuous ECG, pulse oximetry and cardiopulmonary monitoring was performed throughout the entire procedure by the interventional radiology nurse. Total sedation time was one hour.   ACCESS: 5 French sheath, left common femoral artery.   FLUORO TIME: 6.6 minutes.   CONTRAST USED: Isovue 70 mL.   INDICATIONS: Mr. Prindle is a 64 year old gentleman who presented to the hospital ER with diabetic foot abscess and gangrenous changes. He has undergone multiple debridements and amputation of the fourth and fifth rays. He now has an open wound and will require optimal perfusion for wound healing, pedal pulses are nonpalpable, and he is therefore undergoing angiography with the intention for intervention. The risks and benefits were reviewed, all questions are answered, the patient agrees to proceed.   DESCRIPTION OF PROCEDURE: The patient is taken to special procedures and placed in the supine position. After adequate sedation is achieved, the left groin is prepped and draped in a sterile fashion. Ultrasound is placed in a sterile sleeve. Ultrasound is utilized secondary to lack of appropriate landmarks and to avoid vascular injury. Under direct ultrasound visualization, the common femoral artery is identified. It is  echolucent and pulsatile indicating patency. Image is recorded. Micropuncture needle is used to access the anterior wall and direct ultrasound visualization, microwire followed by microsheath, J-wire followed by 5 [place] sheath, and 5 French pigtail catheter. Pigtail catheter is then positioned at the level of T12 and AP projection of the aorta is obtained. Pigtail catheter is repositioned to above the bifurcation and LAO projection of the iliac system is obtained. A stiff-angled Glidewire and pigtail catheter are then negotiated into the SFA and distal runoff is obtained. Images demonstrate tibial disease and 4000 units of heparin is given. The stiff-angled Glidewire is reintroduced. The pigtail catheter and 5 French sheath are removed and a 5 French RAABE sheath is advanced up and over the aortic bifurcation and down with its tip sitting in the proximal one third of the SFA. Using an angled catheter and a V18 wire, as well as Platinum Plus wire, the catheter is negotiated into the anterior tibial and the multiple lesions are crossed. Hand injection of contrast in the catheter demonstrates the distal anterior tibial and pedal runoff. This represents third order catheter placement. The 18 wire is reintroduced and a 2.5 x 10 Fox balloon is advanced across the lesions beginning distally. Two serial inflations are performed, both to 20 atmospheres, which inflate the Fox balloon to a total diameter of 2.7. Follow-up angiography demonstrates wide patency of the anterior tibial with approximately 15% residual stenosis. This does not appear flow-limiting and is a tremendous improvement over the preangioplasty status. Given the nature of tibial disease, it seemed prudent to try to inflate this area to 38 mm with such a nice result and therefore the procedure was terminated. The sheath is pulled into the left external iliac and the oblique view is obtained, Perclose device  is deployed successfully, and there are no immediate  complications.   INTERPRETATION: The abdominal aorta, bilateral common iliacs and external iliac arteries are widely patent.   The right common femoral and profunda femoris are widely patent. Superficial femoral artery and popliteal arteries are widely patent. Beginning in the tibial with the tibial vessels there is occlusion of the posterior tibial at the origin and the posterior tibial remains occluded throughout its course. The lateral plantar never fills. Peroneal is patent throughout its course but is quite small and appears to terminate with Spartan collaterals that do not do a very good job of crossing the ankle. Anterior tibial is widely patent in its proximal one third and in its middle to distal one thirds there are three serial stenoses of greater than 90%. Distally the anterior tibial artery is widely patent and fills the dorsalis pedis which fills the pedal arch. Following angioplasty, as noted above, there is moderate stenosis but this does not appear to be flow limiting measuring       approximately 15% at one of the lesions. The other two are widely patent with less than 5% residual stenosis. This appears to be a most satisfactory result.   SUMMARY: Successful recanalization of the anterior tibial on the right side for limb salvage.  ____________________________ Katha Cabal, MD ggs:slb D: 08/17/2011 19:00:00 ET T: 08/18/2011 11:03:21 ET JOB#: [id number]  cc: Katha Cabal, MD, <Dictator> Heinz Knuckles. Blocker, MD Larkin Ina A. Vickki Muff, DPM Morton Peters., MD

## 2014-05-19 NOTE — Op Note (Signed)
PATIENT NAME:  Benjamin Davidson, Benjamin Davidson MR#:  [id number] DATE OF BIRTH:  [birth year]  DATE OF PROCEDURE:  08/14/2011  PREOPERATIVE DIAGNOSIS: Osteomyelitis requiring long-term IV antibiotics.   POSTOPERATIVE DIAGNOSIS: Osteomyelitis requiring long-term IV antibiotics.  PROCEDURES:  1. Ultrasound guidance for vascular access to right basilic vein.  2. Fluoroscopic guidance for placement of catheter.  3. Insertion of peripherally inserted central venous catheter, right arm.  SURGEON: Algernon Huxley, MD   ANESTHESIA: Local.   ESTIMATED BLOOD LOSS: Minimal.   INDICATION FOR PROCEDURE: The patient is a 64 year old white male with osteomyelitis in his foot. He will require extended IV antibiotics and PICC line is requested.   DESCRIPTION OF PROCEDURE: The patient's right arm was sterilely prepped and draped, and a sterile surgical field was created. The right basilic vein was accessed under direct ultrasound guidance without difficulty with a micropuncture needle and permanent image was recorded. 0.018 wire was then placed into the superior vena cava. Peel-away sheath was placed over the wire. A single lumen peripherally inserted central venous catheter was then placed over the wire and the wire and peel-away sheath were removed. The catheter tip was placed into the superior vena cava and was secured at the skin at 41 cm with a sterile dressing. The catheter withdrew blood well and flushed easily with heparinized saline. The patient tolerated procedure well.  ____________________________ Algernon Huxley, MD jsd:drc D: 08/14/2011 15:14:31 ET T: 08/14/2011 16:13:39 ET JOB#: [id number]  cc: Algernon Huxley, MD, <Dictator> Algernon Huxley MD ELECTRONICALLY SIGNED 08/14/2011 16:28

## 2014-05-21 ENCOUNTER — Telehealth: Payer: Self-pay | Admitting: *Deleted

## 2014-05-21 ENCOUNTER — Encounter: Payer: Self-pay | Admitting: Internal Medicine

## 2014-05-21 ENCOUNTER — Ambulatory Visit (INDEPENDENT_AMBULATORY_CARE_PROVIDER_SITE_OTHER): Payer: Medicare HMO | Admitting: Internal Medicine

## 2014-05-21 VITALS — BP 138/70 | HR 80 | Temp 98.1°F | Wt 255.0 lb

## 2014-05-21 DIAGNOSIS — E1151 Type 2 diabetes mellitus with diabetic peripheral angiopathy without gangrene: Secondary | ICD-10-CM

## 2014-05-21 DIAGNOSIS — E1159 Type 2 diabetes mellitus with other circulatory complications: Secondary | ICD-10-CM

## 2014-05-21 DIAGNOSIS — Z89429 Acquired absence of other toe(s), unspecified side: Secondary | ICD-10-CM

## 2014-05-21 DIAGNOSIS — N183 Chronic kidney disease, stage 3 unspecified: Secondary | ICD-10-CM

## 2014-05-21 DIAGNOSIS — I739 Peripheral vascular disease, unspecified: Secondary | ICD-10-CM

## 2014-05-21 DIAGNOSIS — E1165 Type 2 diabetes mellitus with hyperglycemia: Principal | ICD-10-CM

## 2014-05-21 DIAGNOSIS — E1122 Type 2 diabetes mellitus with diabetic chronic kidney disease: Secondary | ICD-10-CM

## 2014-05-21 DIAGNOSIS — F3340 Major depressive disorder, recurrent, in remission, unspecified: Secondary | ICD-10-CM

## 2014-05-21 DIAGNOSIS — Z86718 Personal history of other venous thrombosis and embolism: Secondary | ICD-10-CM

## 2014-05-21 DIAGNOSIS — IMO0002 Reserved for concepts with insufficient information to code with codable children: Secondary | ICD-10-CM

## 2014-05-21 NOTE — Progress Notes (Signed)
Subjective:    Patient ID: Benjamin Davidson, male    DOB: 05/09/1950, 64 y.o.   MRN: [id number]  HPI Here for follow up of diabetes and other chronic health problems  Doing okay Taking the metformin bid and tolerating Checking sugars bid-- running some better. Fasting and at night Variable but 100-150 generally No hypoglycemic reactions Takes black seed oil to help sugar---if he is running high  No sores in feet Dry skin--tries lotion No pain-- doesn't really exercise  No chest pain No SOB No dizziness or syncope--- does note mild lightheadedness at times (if he gets up quickly)  Mood is fine No depression lately  Current Outpatient Prescriptions on File Prior to Visit  Medication Sig Dispense Refill  . aspirin EC 325 MG EC tablet Take 325 mg by mouth daily.      . carvedilol (COREG) 6.25 MG tablet Take 1 tablet (6.25 mg total) by mouth 2 (two) times daily. 180 tablet 3  . DEVILS CLAW PO Take by mouth daily.    . furosemide (LASIX) 20 MG tablet Take one tablet by mouth every day. If increased swelling, may take one additional tablet daily as needed. 60 tablet 3  . gabapentin (NEURONTIN) 300 MG capsule Take 300 mg by mouth 2 (two) times daily.     . hydrochlorothiazide (HYDRODIURIL) 25 MG tablet Take 1 tablet (25 mg total) by mouth daily. 30 tablet 6  . insulin NPH-insulin regular (NOVOLIN 70/30) (70-30) 100 UNIT/ML injection Inject 35 Units into the skin 2 (two) times daily with a meal.      . Insulin Pen Needle (NOVOFINE 31) 31G X 6 MM MISC by Does not apply route.      Marland Kitchen losartan-hydrochlorothiazide (HYZAAR) 100-12.5 MG per tablet Take 1 tablet by mouth daily. 90 tablet 3  . metFORMIN (GLUCOPHAGE) 500 MG tablet Take 1 tablet (500 mg total) by mouth 2 (two) times daily with a meal. 60 tablet 11  . NOVOLIN R 100 UNIT/ML injection as needed.     . rosuvastatin (CRESTOR) 40 MG tablet Take 1 tablet (40 mg total) by mouth at bedtime. 90 tablet 3  . sertraline (ZOLOFT) 100 MG  tablet Take 100 mg by mouth daily.      Marland Kitchen warfarin (COUMADIN) 5 MG tablet Take warfarin as directed. 120 tablet 0   No current facility-administered medications on file prior to visit.    Allergies  Allergen Reactions  . Lisinopril     Cough Flu like symptoms     Past Medical History  Diagnosis Date  . Pure hypercholesterolemia   . MDD (recurrent major depressive disorder) in remission   . Left leg DVT   . Coronary artery disease   . DM (diabetes mellitus), type 2, uncontrolled, periph vascular complic     Type II  . Hypertension   . CHF (congestive heart failure)   . Peripheral vascular disease     Left toe amputations. Dr Lucky Cowboy    Past Surgical History  Procedure Laterality Date  . Coronary artery bypass graft    . Toe amputation      left 5th, right 4th and 5th  . Percutaneous stent intervention      right leg  . Peripherally inserted central catheter insertion  2013    Family History  Problem Relation Age of Onset  . Hyperlipidemia Brother     History   Social History  . Marital Status: Divorced    Spouse Name: N/A  . Number of  Children: 0  . Years of Education: N/A   Occupational History  . [name]     Disabled since 2000's   Social History Main Topics  . Smoking status: Never Smoker   . Smokeless tobacco: Never Used  . Alcohol Use: No  . Drug Use: No  . Sexual Activity: Not on file   Other Topics Concern  . Not on file   Social History Narrative   Review of Systems Weight is stable Sleeps okay--uses eucalyptus and other aroma therapy Has knot on back of head---sister very worried about this    Objective:   Physical Exam  Constitutional: He appears well-developed. No distress.  Neck: Normal range of motion. Neck supple. No thyromegaly present.  2-2.5cm cyst on left occiput  Cardiovascular: Normal rate, regular rhythm and normal heart sounds.  Exam reveals no gallop.   No murmur heard. Faint pedal pulses  Pulmonary/Chest: Effort  normal and breath sounds normal. No respiratory distress. He has no wheezes. He has no rales.  Musculoskeletal: He exhibits no edema or tenderness.  Absent right 4th and 5th toes and left 5th toe  Lymphadenopathy:    He has no cervical adenopathy.  Skin:  No foot ulcers  Psychiatric: He has a normal mood and affect. His behavior is normal.          Assessment & Plan:

## 2014-05-21 NOTE — Assessment & Plan Note (Signed)
High risk so staying on coumadin

## 2014-05-21 NOTE — Assessment & Plan Note (Signed)
Sites look clean and dry

## 2014-05-21 NOTE — Assessment & Plan Note (Signed)
Slight pulses No pain

## 2014-05-21 NOTE — Assessment & Plan Note (Signed)
Need to stay on medication indefinitely

## 2014-05-21 NOTE — Telephone Encounter (Signed)
Spoke with patient's sister and advised per Dr. Silvio Pate that the lump on his back is just a benign cyst.

## 2014-05-21 NOTE — Assessment & Plan Note (Signed)
Hopefully better control Will increase metformin to 1000 bid if still over 8%

## 2014-05-21 NOTE — Progress Notes (Signed)
Pre visit review using our clinic review tool, if applicable. No additional management support is needed unless otherwise documented below in the visit note. 

## 2014-05-21 NOTE — Assessment & Plan Note (Signed)
Stable Will recheck next time

## 2014-05-22 ENCOUNTER — Ambulatory Visit (INDEPENDENT_AMBULATORY_CARE_PROVIDER_SITE_OTHER): Payer: Medicare HMO | Admitting: Cardiovascular Disease

## 2014-05-22 ENCOUNTER — Encounter: Payer: Self-pay | Admitting: Cardiovascular Disease

## 2014-05-22 ENCOUNTER — Encounter: Payer: Self-pay | Admitting: *Deleted

## 2014-05-22 VITALS — BP 110/54 | HR 63 | Ht 70.0 in | Wt 252.0 lb

## 2014-05-22 DIAGNOSIS — E1159 Type 2 diabetes mellitus with other circulatory complications: Secondary | ICD-10-CM | POA: Diagnosis not present

## 2014-05-22 DIAGNOSIS — E1165 Type 2 diabetes mellitus with hyperglycemia: Secondary | ICD-10-CM

## 2014-05-22 DIAGNOSIS — E785 Hyperlipidemia, unspecified: Secondary | ICD-10-CM

## 2014-05-22 DIAGNOSIS — IMO0002 Reserved for concepts with insufficient information to code with codable children: Secondary | ICD-10-CM

## 2014-05-22 DIAGNOSIS — R0602 Shortness of breath: Secondary | ICD-10-CM

## 2014-05-22 DIAGNOSIS — Z86718 Personal history of other venous thrombosis and embolism: Secondary | ICD-10-CM

## 2014-05-22 DIAGNOSIS — I10 Essential (primary) hypertension: Secondary | ICD-10-CM

## 2014-05-22 DIAGNOSIS — I25810 Atherosclerosis of coronary artery bypass graft(s) without angina pectoris: Secondary | ICD-10-CM

## 2014-05-22 DIAGNOSIS — E1151 Type 2 diabetes mellitus with diabetic peripheral angiopathy without gangrene: Secondary | ICD-10-CM

## 2014-05-22 LAB — HEMOGLOBIN A1C: Hgb A1c MFr Bld: 7.8 % — ABNORMAL HIGH (ref 4.6–6.5)

## 2014-05-22 NOTE — Progress Notes (Signed)
Patient ID: Benjamin Davidson, male    DOB: Jan 28, 1951, 64 y.o.   MRN: [id number]  HPI Comments: 64 year old gentleman with a history of obesity, coronary artery disease, bypass surgery March 2008, poorly controlled diabetes, sedentary lifestyle,  history of DVT in the left leg,  on chronic warfarin who presents for routine followup of his coronary artery disease. 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 He had gangrene of the fifth toe on the right with acute renal failure and dehydration admitted 08/07/2011    Benjamin Davidson states overall he is doing well.  He has been doing aromatherapy for the past 6 months and reports that he is never felt better. No regular exercise He reports that his sugars have improved, he is trying to watch his diet  Most recent hemoglobin A1c 7.8, down from 8.7  On previous visits had chronic lower extremity edema. This has resolved  Takes crestor 40 mg  In the morning with no complaints.   EKG on today's visit shows normal sinus rhythm with rate 63 bpm, left bundle branch block  Other past medical history last stress test July 2008 showing mild ischemia in the mid anteroseptal, apical anterior and apical septal region. Ejection fraction 40%. This test was done post bypass. No cardiac catheterization was done in followup as he had no symptoms.       Allergies  Allergen Reactions  . Lisinopril     Cough Flu like symptoms     Outpatient Encounter Prescriptions as of 05/22/2014  Medication Sig  . aspirin EC 325 MG EC tablet Take 325 mg by mouth daily.    Marland Kitchen BLACK CURRANT SEED OIL PO Take by mouth.  . carvedilol (COREG) 6.25 MG tablet Take 1 tablet (6.25 mg total) by mouth 2 (two) times daily.  Marland Kitchen DEVILS CLAW PO Take by mouth daily.  . furosemide (LASIX) 20 MG tablet Take one tablet by mouth every day. If increased swelling, may take one additional tablet daily as needed.   . gabapentin (NEURONTIN) 300 MG capsule Take 300 mg by mouth 2 (two) times daily.   . hydrochlorothiazide (HYDRODIURIL) 25 MG tablet Take 1 tablet (25 mg total) by mouth daily.  . insulin NPH-insulin regular (NOVOLIN 70/30) (70-30) 100 UNIT/ML injection Inject 35 Units into the skin 2 (two) times daily with a meal.    . Insulin Pen Needle (NOVOFINE 31) 31G X 6 MM MISC by Does not apply route.    Marland Kitchen losartan-hydrochlorothiazide (HYZAAR) 100-12.5 MG per tablet Take 1 tablet by mouth daily.  . metFORMIN (GLUCOPHAGE) 500 MG tablet Take 1 tablet (500 mg total) by mouth 2 (two) times daily with a meal.  . NOVOLIN R 100 UNIT/ML injection as needed.   . rosuvastatin (CRESTOR) 40 MG tablet Take 1 tablet (40 mg total) by mouth at bedtime.  . sertraline (ZOLOFT) 100 MG tablet Take 100 mg by mouth daily.    Marland Kitchen warfarin (COUMADIN) 5 MG tablet Take warfarin as directed.    Past Medical History  Diagnosis Date  . Pure hypercholesterolemia   . MDD (recurrent major depressive disorder) in remission   . Left leg DVT   . Coronary artery disease   . DM (diabetes mellitus), type 2, uncontrolled, periph vascular complic     Type II  . Hypertension   . CHF (congestive heart failure)   . Peripheral vascular disease     Left toe amputations.  Dr Lucky Cowboy    Past Surgical History  Procedure Laterality Date  . Coronary artery bypass graft    . Toe amputation      left 5th, right 4th and 5th  . Percutaneous stent intervention      right leg  . Peripherally inserted central catheter insertion  2013    Social History  reports that he has never smoked. He has never used smokeless tobacco. He reports that he does not drink alcohol or use illicit drugs.  Family History family history includes Hyperlipidemia in his brother.       Review of Systems  Constitutional: Negative.   Respiratory: Negative.   Cardiovascular: Negative.   Gastrointestinal: Negative.   Musculoskeletal: Positive for gait problem.   Skin: Negative.   Neurological: Negative.   Hematological: Negative.   Psychiatric/Behavioral: Negative.   All other systems reviewed and are negative.   BP 110/54 mmHg  Pulse 63  Ht 5\' 10"  (1.778 m)  Wt 252 lb (114.306 kg)  BMI 36.16 kg/m2  Physical Exam  Constitutional: He is oriented to person, place, and time. He appears well-developed and well-nourished.  Obese  HENT:  Head: Normocephalic.  Nose: Nose normal.  Mouth/Throat: Oropharynx is clear and moist.  Eyes: Conjunctivae are normal. Pupils are equal, round, and reactive to light.  Neck: Normal range of motion. Neck supple. No JVD present.  Cardiovascular: Normal rate, regular rhythm, S1 normal, S2 normal, normal heart sounds and intact distal pulses.  Exam reveals no gallop and no friction rub.   No murmur heard. Pulmonary/Chest: Effort normal and breath sounds normal. No respiratory distress. He has no wheezes. He has no rales. He exhibits no tenderness.  Abdominal: Soft. Bowel sounds are normal. He exhibits no distension. There is no tenderness.  Musculoskeletal: Normal range of motion. He exhibits no edema or tenderness.  Lymphadenopathy:    He has no cervical adenopathy.  Neurological: He is alert and oriented to person, place, and time. Coordination normal.  Skin: Skin is warm and dry. No rash noted. No erythema.  Psychiatric: He has a normal mood and affect. His behavior is normal. Judgment and thought content normal.      Assessment and Plan   Nursing note and vitals reviewed.

## 2014-05-22 NOTE — Assessment & Plan Note (Signed)
Improved symptoms of shortness of breath. Recommended he start a regular walking program

## 2014-05-22 NOTE — Assessment & Plan Note (Signed)
Sedentary at baseline. 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

## 2014-05-22 NOTE — Assessment & Plan Note (Signed)
Currently with no symptoms of angina. No further workup at this time. Continue current medication regimen. 

## 2014-05-22 NOTE — Assessment & Plan Note (Signed)
Cholesterol is at goal on the current lipid regimen. No changes to the medications were made.  

## 2014-05-22 NOTE — Assessment & Plan Note (Signed)
Blood pressure is well controlled on today's visit. No changes made to the medications. 

## 2014-05-22 NOTE — Assessment & Plan Note (Signed)
We have encouraged continued exercise, careful diet management in an effort to lose weight. 

## 2014-05-22 NOTE — Patient Instructions (Signed)
You are doing well. No medication changes were made.  Please call us if you have new issues that need to be addressed before your next appt.  Your physician wants you to follow-up in: 6 months.  You will receive a reminder letter in the mail two months in advance. If you don't receive a letter, please call our office to schedule the follow-up appointment.   

## 2014-05-24 NOTE — Consult Note (Signed)
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  He is status post left fifth ray amputation. He is a  poorly controlled diabetic and hypertensive. The patient notes purulent drainage. In the Emergency Room, the patient was found to be dehydrated in acute renal failure with gangrene of the right fifth toe and was admitted for further evaluation.   PAST MEDICAL HISTORY:  1. Peripheral vascular disease, status post amputation left fifth toe.  2. 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  3. History of deep vein thrombosis/pulmonary embolism.  4. Depression. 5. Hyperlipidemia.  6. Type 2 diabetes.  7. Benign hypertension.  8. History of congestive heart failure.   Home Medications: Medication Instructions Status  furosemide 20 mg oral tablet 1 tab(s) orally once a day if increased swelling take 1 additional tablet as needed. Active  Humulin 70/30 subcutaneous suspension 35 unit(s) subcutaneous 2 times a day as directed. Active  hydrochlorothiazide 25 mg oral tablet 1 tab(s) orally once a day Active  warfarin 5 mg oral tablet 1 tab(s) orally once a day as directed. Active  Micardis HCT 80 mg-12.5 mg oral tablet 1 tab(s) orally once a day Active  Crestor 20 mg oral tablet 1 tab(s) orally once a day (at bedtime) Active  sertraline 100 mg oral tablet 1 tab(s) orally once a day Active  Bystolic 10 mg oral tablet 1 tab(s) orally once a day Active  aspirin 325 mg oral tablet 1 tab(s) orally once a day Active    Lisinopril: Alt Ment Status  Case History:   Family History Non-Contributory    Social History negative tobacco, negative ETOH, negative Illicit drugs   Review of Systems:   Fever/Chills No    Cough No    Sputum No     Abdominal Pain No    Diarrhea No    Constipation No    Nausea/Vomiting No    SOB/DOE No    Chest Pain No    Telemetry Reviewed NSR    Dysuria No    Tolerating Diet Yes   Physical Exam:   GEN well developed, well nourished, no acute distress    HEENT pink conjunctivae, PERRL, hearing intact to voice    NECK supple  trachea midline    RESP normal resp effort  no use of accessory muscles    CARD regular rate  no JVD    ABD denies tenderness  soft  nondistended    EXTR negative cyanosis/clubbing, positive edema, popliteal and pedal pulses are nonpalpable bilaterally.  Well heled left 5th ray amputation site.    SKIN normal to palpation, positive ulcers, gangrenous changes to the right 5th toe    NEURO cranial nerves intact, follows commands, motor/sensory function intact    PSYCH alert, poor insight   Nursing/Ancillary Notes: **Vital Signs.:   09-Jul-13 09:04   Vital Signs Type Routine   Temperature Temperature (F) 97.3   Celsius 36.2   Temperature Source oral   Pulse Pulse 74   Pulse source if not from Vital Sign Device brachial   Respirations Respirations 20   Systolic BP Systolic BP 88   Diastolic BP (mmHg) Diastolic BP (mmHg) 49   Mean BP 62   BP Source  if not from Vital Sign Device non-invasive  Pulse Ox % Pulse Ox % 97   Pulse Ox Activity Level  At rest   Oxygen Delivery Room Air/ 21 %   Hepatic:  08-Jul-13 13:43    Bilirubin, Total 0.6   Alkaline Phosphatase 105   SGPT (ALT) 30 (12-78 NOTE: NEW REFERENCE RANGE 12/23/2010)   SGOT (AST) 27   Total Protein, Serum 8.2   Albumin, Serum  3.0  09-Jul-13 06:19    Bilirubin, Total 0.5   Alkaline Phosphatase 95   SGPT (ALT) 26 (12-78 NOTE: NEW REFERENCE RANGE 12/23/2010)   SGOT (AST) 25   Total Protein, Serum 7.1   Albumin, Serum  2.5  Routine Chem:  08-Jul-13 13:43    Glucose, Serum  219   BUN  54   Creatinine (comp)  2.31   Sodium, Serum  132   Potassium, Serum 4.9   Chloride, Serum   97   CO2, Serum 24   Calcium (Total), Serum 9.3   Osmolality (calc) 286   eGFR (African American)  35   eGFR (Non-African American)  30 (eGFR values <47m/min/1.73 m2 may be an indication of chronic kidney disease (CKD). Calculated eGFR is useful in patients with stable renal function. The eGFR calculation will not be reliable in acutely ill patients when serum creatinine is changing rapidly. It is not useful in  patients on dialysis. The eGFR calculation may not be applicable to patients at the low and high extremes of body sizes, pregnant women, and vegetarians.)   Anion Gap 11  09-Jul-13 06:19    Glucose, Serum  162   BUN  50   Creatinine (comp)  1.85   Sodium, Serum  133   Potassium, Serum 4.2   Chloride, Serum 98   CO2, Serum 23   Calcium (Total), Serum 8.7   Osmolality (calc) 283   eGFR (African American)  45   eGFR (Non-African American)  39 (eGFR values <679mmin/1.73 m2 may be an indication of chronic kidney disease (CKD). Calculated eGFR is useful in patients with stable renal function. The eGFR calculation will not be reliable in acutely ill patients when serum creatinine is changing rapidly. It is not useful in  patients on dialysis. The eGFR calculation may not be applicable to patients at the low and high extremes of body sizes, pregnant women, and vegetarians.)   Anion Gap 12  Cardiac:  08-Jul-13 13:43    CK, Total  289   CPK-MB, Serum  6.8 (Result(s) reported on 07 Aug 2011 at 02:52PM.)   Troponin I < 0.02 (0.00-0.05 0.05 ng/mL or less: NEGATIVE  Repeat testing in 3-6 hrs  if clinically indicated. >0.05 ng/mL: POTENTIAL  MYOCARDIAL INJURY. Repeat  testing in 3-6 hrs if  clinically indicated. NOTE: An increase or decrease  of 30% or more on serial  testing suggests a  clinically important change)  Routine UA:  08-Jul-13 13:43    Color (UA) Yellow   Clarity (UA) Hazy   Glucose (UA) Negative   Bilirubin (UA) Negative   Ketones (UA) Negative    Specific Gravity (UA) 1.013   Blood (UA) Negative   pH (UA) 5.0   Protein (UA) Negative   Nitrite (UA) Negative   Leukocyte Esterase (UA) 1+ (Result(s) reported on 07 Aug 2011 at 02:23PM.)   RBC (UA) 1 /HPF   WBC (UA) 7 /HPF   Bacteria (UA) NONE SEEN   Epithelial Cells (UA) <1 /HPF   Transitional Epithelial (UA) <1 /HPF   WBC Clump (UA) PRESENT   Mucous (  UA) PRESENT   Hyaline Cast (UA) 11 /LPF   Granular Cast (UA) 1 /LPF (Result(s) reported on 07 Aug 2011 at 02:23PM.)  Routine Coag:  09-Jul-13 06:19    Prothrombin  31.2   INR 3.0 (INR reference interval applies to patients on anticoagulant therapy. A single INR therapeutic range for coumarins is not optimal for all indications; however, the suggested range for most indications is 2.0 - 3.0. Exceptions to the INR Reference Range may include: Prosthetic heart valves, acute myocardial infarction, prevention of myocardial infarction, and combinations of aspirin and anticoagulant. The need for a higher or lower target INR must be assessed individually. Reference: The Pharmacology and Management of the Vitamin K  antagonists: the seventh ACCP Conference on Antithrombotic and Thrombolytic Therapy. NOBSJ.6283 Sept:126 (3suppl): [id number]. A HCT value >55% may artifactually increase the PT.  In one study,  the increase was an average of 25%. Reference:  "Effect on Routine and Special Coagulation Testing Values of Citrate Anticoagulant Adjustment in Patients with High HCT Values." American Journal of Clinical Pathology 2006;126:400-405.)  Routine Hem:  08-Jul-13 13:43    WBC (CBC)  21.2   RBC (CBC)  3.97   Hemoglobin (CBC)  10.0   Hematocrit (CBC)  31.7   Platelet Count (CBC)  455 (Result(s) reported on 07 Aug 2011 at 02:42PM.)   MCV 80   MCH  25.2   MCHC  31.5   RDW  14.8  09-Jul-13 06:19    WBC (CBC)  17.1   RBC (CBC)  3.39   Hemoglobin (CBC)  8.6   Hematocrit (CBC)  26.6   Platelet Count (CBC) 381   MCV  79   MCH  25.5    MCHC 32.5   RDW  14.7   Neutrophil % 81.6   Lymphocyte % 8.8   Monocyte % 9.4   Eosinophil % 0.1   Basophil % 0.1   Neutrophil #  14.0   Lymphocyte # 1.5   Monocyte #  1.6   Eosinophil # 0.0   Basophil # 0.0 (Result(s) reported on 08 Aug 2011 at 06:57AM.)     Impression 1. Atherosclerosis of the lower extremities with gangrene of the right fifth digit.                    Patient will require angiography with the hope of intervention for limb salvage                   I have discussed with him in detail that his leg is in jepardy and the need for maximizing perfusion for wound healing. However, exposure to conatrast given his acute renal dysfunction is unwise.  Continue antibiotics and Podiatry is on consult. 2. Type 2 diabetes                    Sliding scale insulin 3. Acute renal failure.                      contnue hydration with IV fluids                     follow Cr                     will hold on angio until cr normalizes and then minimize to the best of my ability contrast volume 4. Dehydration.  continue IV and po  fluids 5. Hypokalemia.                      replete K per medical service  6. Anemia.                      no transfusion at this time  7. History of deep vein thrombosis.                       will need to be off coumadin for now ok for heparin    Plan level 5 consult   Electronic Signatures: Hortencia Pilar (MD)  (Signed 10-Jul-13 14:28)  Authored: General Aspect/Present Illness, Home Medications, Allergies, History and Physical Exam, Vital Signs, Labs, Impression/Plan   Last Updated: 10-Jul-13 14:28 by Hortencia Pilar (MD)

## 2014-05-24 NOTE — Op Note (Signed)
PATIENT NAME:  Benjamin Davidson, Benjamin Davidson MR#:  [id number] DATE OF BIRTH:  1950-05-19  DATE OF PROCEDURE:  08/11/2011  PREOPERATIVE DIAGNOSIS: Right lateral foot infection, fourth toe and joint.   POSTOPERATIVE DIAGNOSIS: Right lateral foot infection, fourth toe and joint.   PROCEDURE: Fourth toe and metatarsal partial ray amputation, right foot.   SURGEON: Keltie Labell A. Vickki Muff, DPM  ANESTHESIA: MAC with local.   HEMOSTASIS: None.   COMPLICATIONS: None.   SPECIMEN: Right fourth toe and metatarsal for pathology.   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 He continued to have noted infectious tissue to the fourth toe and joint and brought back today for continued debridement. All risks, benefits, alternatives, and complications associated with surgery were discussed with the patient and informed consent was given.   DESCRIPTION OF PROCEDURE: The patient was brought into the OR and placed on the operating table in the supine position. IV sedation was administered by the anesthesia team. The right lower extremity was then prepped and draped in the usual sterile fashion. Attention was directed to the right lateral foot where the wound was opened already. The fourth toe and joint had noted infected tissue. At this time, a racket-type of incision was made around the fourth toe and joint. Full thickness incision was then made down to the mid metatarsal region. With a power saw, the metatarsal was taken at about the mid shaft level and excision was taken of the toe and metatarsal in toto. The remainder of the soft tissue was noted to be intact with no signs of any purulent drainage, at this time. I was able to debride the rest of the wound with the VersaJet to excise all of the soft tissue. All bleeders were Bovie cauterized. This was flushed and  cleansed appropriately. It was then packed with a wet to dry gauze dressing and a bulky sterile dressing was placed.   He will be readmitted to the floor. We will continue with nonweightbearing to his right foot. We are awaiting cultures taken intraoperatively two days ago. We will begin dressing changes. He will likely need a wound VAC to the area to get some type of closure to this wound.  ____________________________ Pete Glatter Vickki Muff, DPM jaf:slb D: 08/11/2011 13:32:25 ET T: 08/11/2011 14:08:44 ET JOB#: [id number]  cc: Larkin Ina A. Vickki Muff, DPM, <Dictator> Marty Sadlowski DPM ELECTRONICALLY SIGNED 09/08/2011 10:51

## 2014-05-24 NOTE — Consult Note (Signed)
Details:    - Pt seen by consultant.  full note dictated. Noted gangrenous changes to right 5th mtpj. Foul odor with purulence and fluctuant abscess. Noted elevated WBC. -Will need surgical debridment.  D/W pt.   -Plan to OR tomorrow pm.  Will need 5th toe amputation and likely partial metatarsal amputation. -Wound culture performed. -NPO after midnight.   Electronic Signatures: Samara Deist (MD)  (Signed 09-Jul-13 13:42)  Authored: Details   Last Updated: 09-Jul-13 13:42 by Samara Deist (MD)

## 2014-05-24 NOTE — H&P (Signed)
PATIENT NAME:  Benjamin Davidson, RHINE MR#:  [id number] DATE OF BIRTH:  Feb 12, 1950  DATE OF ADMISSION:  08/07/2011  REFERRING PHYSICIAN: Dr. Lisa Roca PRIMARY CARE PHYSICIAN: Dr. Hardin Negus   REASON FOR ADMISSION: Gangrene of the right fifth toe with acute renal failure and dehydration.   HISTORY OF PRESENT ILLNESS: The patient is a 64 year old male with a history of previous deep vein thrombosis on anticoagulation as well as peripheral vascular disease status post left fifth digit amputation. Also has a history of poorly controlled diabetes and hypertension. Presents to the Emergency Room with pain and discoloration of the right fifth digit. Some ulceration and purulent have been noted. In the Emergency Room, the patient was found to be dehydrated in acute renal failure with gangrene of the right fifth toe and is now admitted for further evaluation.   PAST MEDICAL HISTORY:  1. Peripheral vascular disease, status post amputation left fifth toe.  2. 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  3. History of deep vein thrombosis/pulmonary embolism.  4. Depression. 5. Hyperlipidemia.  6. Type 2 diabetes.  7. Benign hypertension.  8. History of congestive heart failure.   MEDICATIONS:  1. Zoloft 100 mg p.o. daily.  2. Coumadin 5 mg p.o. daily.  3. Micardis/HCT 80/12.5, 1 p.o. daily.  4. Hydrochlorothiazide 25 mg p.o. daily.  5. Humulin 70/30, 35 units sub-Q b.i.d.  6. Bystolic 10 mg p.o. daily.  7. Crestor 20 mg p.o. daily.  8. Aspirin 325 mg p.o. daily.  9. Lasix 20 mg p.o. daily as needed.   ALLERGIES: Lisinopril.   SOCIAL HISTORY: Negative for alcohol or tobacco abuse.   FAMILY HISTORY: Positive for diabetes and possible aneurysm.   REVIEW OF SYSTEMS: CONSTITUTIONAL: No fever or change in weight. EYES: No blurred or double vision. No glaucoma. ENT: No tinnitus or hearing loss. No nasal discharge or bleeding. No difficulty swallowing. RESPIRATORY: No  cough or wheezing. Denies hemoptysis. No painful respiration. CARDIOVASCULAR: No chest pain or orthopnea. No palpitations or syncope. GASTROINTESTINAL: Patient has had nausea and vomiting over the past 24 hours. No diarrhea. No abdominal pain. No change in bowel habits. GENITOURINARY: No dysuria or hematuria. No incontinence. ENDOCRINE: No polyuria or polydipsia. No heat or cold intolerance. HEMATOLOGIC: Patient denies anemia, easy bruising, or bleeding. LYMPHATIC: No swollen glands. MUSCULOSKELETAL: Patient denies pain in his neck, back, shoulders, knees, or hips. No gout. NEUROLOGIC: No numbness although he does have generalized weakness. Denies migraines, stroke or seizures. PSYCH: Patient denies anxiety, insomnia, or depression.   PHYSICAL EXAMINATION:  GENERAL: Patient is in no acute distress.   VITAL SIGNS: Currently remarkable for a blood pressure of 113/51 with a heart rate of 70 and a respiratory rate of 18. He is afebrile.   HEENT: Normocephalic, atraumatic. Pupils equally round and reactive to light and accommodation. Extraocular movements are intact. Sclerae are not icteric. Conjunctivae are clear. Oropharynx is dry but clear.   NECK: Supple without jugular venous distention. No adenopathy or thyromegaly.   LUNGS: Clear to auscultation and percussion without wheezes, rales, or rhonchi. No dullness.   CARDIAC: Regular rate and rhythm. Normal S1, S2. No significant rubs, murmurs, or gallops. PMI is nondisplaced. Chest wall is nontender.   ABDOMEN: Soft, nontender with normoactive bowel sounds. No organomegaly or masses were appreciated. No hernias or bruits were noted.   SKIN: Warm and dry without rash. Gangrene of the right fifth digit was noted.   EXTREMITIES: Trace edema. Pulses were trace on the right and  1+ on the left.   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   PSYCH: Psych exam revealed a patient who was alert and oriented to person,  place, and time. He was cooperative and used good judgment.   LABORATORY, DIAGNOSTIC AND RADIOLOGICAL DATA: Urinalysis was essentially unremarkable. Troponin was less than 0.02. White count was 21.2 with a hemoglobin of 10.0. Glucose was 219 with a BUN of 54 and a creatinine of 2.31 with a GFR of 30 and a sodium of 132.   ASSESSMENT:  1. Gangrene of the right fifth digit.  2. Type 2 diabetes, on insulin.  3. Acute renal failure.  4. Dehydration.  5. Hypokalemia.  6. Anemia.  7. History of deep vein thrombosis.  8. Benign hypertension.   PLAN: Patient will be admitted to the floor, started on IV antibiotics after blood cultures have been sent. Will begin IV fluids and follow his renal function and sodium closely. Will follow his sugars with Accu-Cheks before meals and at bedtime and add sliding scale insulin as needed. Will hold his Coumadin at this time and begin sub-Q Lovenox q.12 hours. Will consult vascular surgery in regards to his gangrene and possible amputation. Will continue his blood pressure medications for now. Further treatment and evaluation will depend upon the patient's progress.   TOTAL TIME SPENT ON THIS PATIENT: 50 minutes.   ____________________________ Leonie Douglas Doy Hutching, MD jds:cms D: 08/07/2011 19:37:07 ET T: 08/08/2011 06:37:53 ET  JOB#: [id number] cc: Leonie Douglas. Doy Hutching, MD, <Dictator> Morton Peters., MD Alston Berrie Lennice Sites MD ELECTRONICALLY SIGNED 08/08/2011 7:52

## 2014-05-24 NOTE — Consult Note (Signed)
Brief Consult Note: Diagnosis: Atherosclerotic occlusive disease with gangrene of the right foot.   Recommend to proceed with surgery or procedure.   Comments: The patient has warm feet but nonpalpaple pedal adn popliteal pulses.  The patient will need angiogram with the hope for intervention for limb salvage but I will delay this for a day or so while his acute renal failure resolves.  Continue IV antibiotics.  Podiatry has seen the patient as well.  Electronic Signatures: Hortencia Pilar (MD)  (Signed 09-Jul-13 18:43)  Authored: Brief Consult Note   Last Updated: 09-Jul-13 18:43 by Hortencia Pilar (MD)

## 2014-05-24 NOTE — Consult Note (Signed)
PATIENT NAME:  Benjamin Davidson, Benjamin Davidson MR#:  [id number] DATE OF BIRTH:  [birth year]  DATE OF CONSULTATION:  08/10/2011  REFERRING PHYSICIAN:  Bettey Costa, MD CONSULTING PHYSICIAN:  Heinz Knuckles. Coltin Casher, MD  REASON FOR CONSULTATION: Osteomyelitis and Staphylococcus aureus bacteremia.   HISTORY OF PRESENT ILLNESS: The patient is a 64 year old white man with a past history significant for diabetes and peripheral vascular disease who was admitted on 08/07/2011 with approximately four week history of a right foot ulcer. The patient states that approximately four weeks ago he developed a blister on the right foot. He states that it eventually ulcerated and he was seen by a physician and was told to apply a cream to the area. He states that it had been doing fairly well until a few days prior to admission when he began having increased pain. He denies any fevers or chills, but did have some sweats. He has not had any nausea or vomiting. He presented to the Emergency Room and was found to have gangrene of the fifth toe. He underwent surgical amputation of the toe and the fifth metatarsal head as well as debridement of the fourth metatarsal on 08/09/2011. Cultures were obtained in the operating room and are currently pending. Blood cultures that were obtained on admission are growing two out of four bottles positive for methicillin-sensitive Staphylococcus aureus. The patient has been on vancomycin and Zosyn since admission. He is a somewhat poor historian and it was very difficult getting timeframes from him, but he denied significant swelling or redness of the foot until the last few days. He has no significant change in his blood sugars at home.   ALLERGIES: Lisinopril.   PAST MEDICAL HISTORY:  1. Diabetes.  2. Peripheral vascular disease status post amputation of the left fifth toe.  3. Coronary artery disease status post coronary artery bypass graft.  4. Deep vein thrombosis.  5. Pulmonary embolism.   6. Depression.  7. Hypertriglyceridemia.  8. Hypertension.  9. Congestive heart failure.   SOCIAL HISTORY: The patient lives by himself. He has no pets or animals at home. He does not drink. He does not smoke. No history of injecting drug use.   FAMILY HISTORY: Positive for diabetes and an aneurysm.  REVIEW OF SYSTEMS: GENERAL: No fevers. No malaise. No chills. Positive sweats. HEENT: No headaches. No sinus congestion. No sore throat. NECK: No stiffness. No swollen glands. RESPIRATORY: No cough or shortness of breath. No sputum production. CARDIAC: No chest pains or palpitations. GI: No nausea, no vomiting, no abdominal pain, and no change in his bowels. GENITOURINARY: No change in his urine. MUSCULOSKELETAL: He has had pain and swelling in the right foot over the last few days prior to admission with some redness. No other joints or muscles have been bothering him. SKIN: No rashes other than the ulceration on his foot. NEUROLOGIC: No focal weakness. PSYCHIATRIC: No complaints. All other systems are negative.   PHYSICAL EXAMINATION:   VITALS: Temperature max 99.0, T-current 98.0, pulse 66, blood pressure 132/66, and 98% on room air.   GENERAL: A 64 year old white man in no acute distress.   HEENT: Normocephalic, atraumatic. Pupils equal and reactive to light. Extraocular motion intact. Sclerae, conjunctivae, and lids are without evidence for emboli or petechiae. Oropharynx shows no erythema or exudate. Teeth and gums are in fair condition.   NECK: Supple. Full range of motion. Midline trachea. No lymphadenopathy. No thyromegaly.   LUNGS: Clear to auscultation bilaterally with good air movement. No focal consolidation.  HEART: Regular rate and rhythm without murmur, rub, or gallop.   ABDOMEN: Soft, nontender, and nondistended. No hepatosplenomegaly. No hernia is noted.   EXTREMITIES: No evidence for tenosynovitis. The right foot wound demonstrated some slough and some necrotic tissue  within the wound. There was erythema extending over the dorsum of the foot and lateral aspect of the foot. This is somewhat decreased from pen marks made previously. There was swelling of the forefoot extending into the mid foot. There was no significant edema at the ankle level and there was no lymphangitic streaking. He is status post amputation of the left fifth toe.   SKIN: No other rashes other than that noted above. There were no stigmata of endocarditis, specifically no Janeway lesions or Osler nodes.   NEUROLOGIC: The patient was awake and interactive, moving all four extremities. He was somewhat slow in answering questions especially those that required a temporal component to the answer.   PSYCHIATRIC: Mood and affect appeared normal.   LABORATORY AND RADIOLOGIC DATA: BUN 18, creatinine 1.07, bicarbonate 21, and anion gap 11. LFTs were unremarkable. White count today is 15.2 with hemoglobin 9.0, platelet count 509, and ANC 12.1. His white count on admission was 21.2.   Blood cultures from admission are growing methicillin-sensitive Staphylococcus aureus in two of four bottles.   Wound cultures from the operating room are pending.   A urinalysis was unremarkable.   A plain film of the right foot showed findings worrisome for osteomyelitis involving the right fifth toe and distal aspect of the fifth metatarsal. There was overlying cellulitis noted as well.   IMPRESSION: A 64 year old white man with a past history significant for diabetes and peripheral vascular disease who was admitted with osteomyelitis and gangrene of the right foot with MSSA bacteremia.   RECOMMENDATIONS:  1. He has undergone amputation of the right fifth toe and metatarsal head with debridement of the fourth metatarsal as well. Podiatry is planning on further debridement with possible further amputations in the future.  2. Cultures of the wound are pending. His blood cultures are now growing methicillin-sensitive  Staphylococcus aureus and the likely source is the foot.  3. Would continue Zosyn until the wound cultures return. If only staph grows then we could change to ceftriaxone. We will discontinue the vancomycin.  4. We will repeat the blood cultures today to document clearance.  5. He will get a transthoracic echocardiogram. Given that he has a known source, if his blood cultures clear and his transthoracic echo is negative, I would not pursue TEE.  6. He will eventually need a PICC line placement for long-term antibiotics. Would wait to place a single lumen PICC until his blood cultures are negative for 72 hours.   This is a high-level infectious disease consult. Thank you very much for involving me in Mr. Gillies's care.  ____________________________ Heinz Knuckles. Treasure Ochs, MD meb:slb D: 08/10/2011 13:30:15 ET T: 08/10/2011 14:32:09 ET JOB#: [id number]  cc: Heinz Knuckles. Alexys Lobello, MD, <Dictator> Nil Xiong E Tania Steinhauser MD ELECTRONICALLY SIGNED 08/11/2011 9:06

## 2014-05-24 NOTE — H&P (Signed)
PATIENT NAME:  Benjamin Davidson, Benjamin Davidson MR#:  [id number] DATE OF BIRTH:  1950-07-04  DATE OF ADMISSION:  02/23/2011  PRIMARY CARE PHYSICIAN: Dr. Hardin Negus  CARDIOLOGIST: Dr. Rockey Situ  CHIEF COMPLAINT: Infection on the leg.   HISTORY OF PRESENT ILLNESS: This is a 64 year old man who states that he has been having this infection on the leg. It started off as a small area and got larger. He pulled at the scab and now it looks like it does now. He has had no fever or chills or sweats. He has been following up with Dr. Hardin Negus. He has a prescription for Augmentin and Vicodin written and filled on the 22nd. He saw Dr. Hardin Negus today and sent into the ER for further evaluation. In the Emergency Room he was found to have a large ulcer on the left calf measuring approximately 5 cm x 5 cm and pretty deep, 3 cm, with necrotic tissue around the edges and hospitalist services were contacted for admission.   PAST MEDICAL HISTORY:  1. Diabetes. 2. Hypertension. 3. Hyperlipidemia. 4. Depression. 5. Deep vein thrombosis many years ago.   PAST SURGICAL HISTORY:  1. CABG back in 2007. 2. Amputation of the left fifth toe.   ALLERGIES: He states to a blood pressure medication, possibly a fluid pill.   MEDICATIONS: As per patient include:  1. Zoloft 100 mg daily.  2. Coumadin 5 mg daily.  3. Micardis/HCT 80/12.5 daily. 4. Humalog 70/30 30 units subcutaneous injection twice a day. 5. Bystolic 10 mg daily.  6. Crestor 10 mg daily.  7. He thinks he is taking a water pill but not sure of the name of that one. 8. Vicodin recently prescribed for pain. 9. Augmentin. 10. He does take an aspirin daily.   SOCIAL HISTORY: No smoking. No alcohol. No drug use. Is on disability.   FAMILY HISTORY: Father with diabetes. Mother and died at 47 of possibly an aneurysm.    REVIEW OF SYSTEMS: CONSTITUTIONAL: Positive for fatigue. No fever, chills, or sweats. No weight loss. No weight gain. EYES: He does wear glasses. EARS, NOSE,  MOUTH, AND THROAT: No hearing loss. No sore throat. No difficulty swallowing. CARDIOVASCULAR: No chest pain. No palpitations. RESPIRATORY: Positive for shortness of breath all the time. No cough. No sputum. No hemoptysis. GASTROINTESTINAL: No nausea. No vomiting. No abdominal pain. No diarrhea. No constipation. No bright red blood per rectum. No melena. GENITOURINARY: No burning on urination. No hematuria. MUSCULOSKELETAL: Positive for joint pain. INTEGUMENTARY: Positive for foot itching and pain. NEUROLOGIC: No fainting or blackouts. PSYCHIATRIC: Positive for depression. ENDOCRINE: No thyroid problems. HEMATOLOGIC/LYMPHATIC: No anemia. No easy bruising or bleeding.   PHYSICAL EXAMINATION:  VITAL SIGNS: Temperature 97.6, pulse 69, respirations 18, blood pressure 161/81, pulse oximetry 97%.   GENERAL: No respiratory distress.   EYES: Conjunctivae and lids normal. Pupils equal, round, and reactive to light. Extraocular muscles intact. No nystagmus.   EARS, NOSE, MOUTH, AND THROAT: Tympanic membranes no erythema. Nasal mucosa no erythema. Throat no erythema. No exudate seen. Lips and gums no lesions.   NECK: No JVD. No bruits. No lymphadenopathy. No thyromegaly. No thyroid nodules palpated.   RESPIRATORY: Lungs clear to auscultation. No use of accessory muscles to breathe. No rhonchi, rales, or wheeze heard.   CARDIOVASCULAR: S1, S2 normal. No gallops, rubs, or murmurs heard. Carotid upstroke 2+ bilaterally. No bruits.   EXTREMITIES: Dorsalis pedis pulses 1+ bilaterally. Trace edema of lower extremity.   ABDOMEN: Soft, nontender. No organomegaly/splenomegaly. Normoactive bowel sounds. No masses  felt.   LYMPHATIC: No lymph nodes in the neck.   MUSCULOSKELETAL: Trace edema. No clubbing. No cyanosis.   SKIN: Left posterior calf area approximately 5 x 5 cm and deep, approximately 2 to 3 cm, necrotic tissue around the edges. Slight bleeding.   NEUROLOGIC: Cranial nerves II through XII grossly  intact. Deep tendon reflexes 1+ bilateral lower extremities.   PSYCHIATRIC: Patient is oriented to person, place, and time.   LABORATORY, DIAGNOSTIC, AND RADIOLOGICAL DATA: White blood cell count 8.2, hemoglobin and hematocrit 10.7 and 32.2, platelet count 423, glucose 329, BUN 24, creatinine 1.34, sodium 135, potassium 4.5, chloride 97, CO2 27, calcium 9.1. Liver function tests normal. EKG normal sinus rhythm, left bundle branch block.  ASSESSMENT AND PLAN:  1. Diabetic ulcer with necrotic tissue. Case discussed with Dr. Pat Patrick, surgery, to evaluate for debridement. Unclear whether this is infected or not. Will send off blood cultures and wound culture. Will get wet to dry dressing. Will put on empiric Unasyn and vancomycin for right now but the patient does not have a fever or white count at this point so I do not expect them to be in the hospital too long after debridement done. The patient will end up needing long-term wound care follow up because this is a deep wound and since the patient is a diabetic probably will take a long time to heal. Will check the patient's hemoglobin A1c in the a.m. to see how good or bad his diabetes is under control.  2. Coronary artery disease. EKG shows a left bundle branch block. Patient is not having any chest pain or shortness of breath. I see no contraindications to debridement at this time.  3. Hypertension. Blood pressure currently a little bit elevated. Will continue to monitor on current medications.  4. Hyperlipidemia. Continue Crestor.  5. Depression. Continue Zoloft.  6. Left bundle branch block on EKG. Unclear if this is new or old. Patient is not having any cardiac symptoms at this point. Will defer further work-up as outpatient.  7. History of deep vein thrombosis. He has been on Coumadin for years. Unclear why the patient needs to be on Coumadin for so many years with one episode of deep vein thrombosis. Will hold the patient's Coumadin for right now in  case Operating Room is needed. Still awaiting INR to be resulted.   TIME SPENT ON ADMISSION: 50 minutes.   ____________________________ Tana Conch. Leslye Peer, MD rjw:cms D: 02/23/2011 17:59:46 ET T: 02/24/2011 05:33:44 ET JOB#: [id number]  cc: Tana Conch. Leslye Peer, MD, <Dictator> Morton Peters., MD Minna Merritts, MD Marisue Brooklyn MD ELECTRONICALLY SIGNED 03/10/2011 16:16

## 2014-05-24 NOTE — Consult Note (Signed)
Impression: 64yo WM w/ h/o DM and PVD admitted with osteomyelitis and gangrene of the right foot with Methacillin Sensitive Staph aureus bacteremia.  He has undergone amputation of the right fifth toe finger and metatarsal head with debridement of the fourth metarsal as well.  Podiatry is planning on further debridement with possibly further amputations in the future.   Cultures of the wound are pending.  His blood cultures are now growing Methacillin Sensitive Staph aureus. Continue zosyn until the wound cultures return.  If only Staph grows, then we could change to ceftriaxone.  Will d/c vanco. Will repeat his blood cultures today. Will get TTE. He will eventually need PICC placement for long term antibiotics.  Would wait to place SINGLE LUMEN PICC until his BCx are negative.   Electronic Signatures: Dorraine Ellender, Heinz Knuckles (MD) (Signed on 11-Jul-13 13:21)  Authored   Last Updated: 11-Jul-13 13:29 by Amaal Dimartino, Heinz Knuckles (MD)

## 2014-05-24 NOTE — Consult Note (Signed)
Brief Consult Note: Diagnosis: Chronic LLE wound.   Patient was seen by consultant.   Consult note dictated.   Recommend further assessment or treatment.   Orders entered.   Comments: Min necrotic edges of geranulating traumatic wound from Mid to late December following fall. No surgical needs. could be debrided of min necrotic tissue at bedside but no need to go to OR. No sign of invasive tissue infection. Rec silvadene BID dressings. Dr Leanora Cover will see pt in am and consider debridement at bedside or in office. No need for wound vacc.  Electronic Signatures: Florene Glen (MD)  (Signed 24-Jan-13 20:02)  Authored: Brief Consult Note   Last Updated: 24-Jan-13 20:02 by Florene Glen (MD)

## 2014-05-24 NOTE — Op Note (Signed)
PATIENT NAME:  Benjamin Davidson, Benjamin Davidson MR#:  [id number] DATE OF BIRTH:  02-Mar-1950  DATE OF PROCEDURE:  08/09/2011  PREOPERATIVE DIAGNOSIS: Right fifth toe and joint gangrene.   POSTOPERATIVE DIAGNOSIS: Right fifth toe gangrene with extension to fourth metatarsophalangeal joint.  PROCEDURE PERFORMED: Right fifth toe amputation and partial fifth metatarsal amputation.   SURGEON: Loys Hoselton A. Vickki Muff, DPM  ANESTHESIA: MAC with local.   HEMOSTASIS: None.   COMPLICATIONS: None.   SPECIMEN: Gangrenous toe for pathology and swab for aerobic and anaerobic culture.   OPERATIVE INDICATIONS: This is a 64 year old gentleman admitted approximately two days ago with worsening changes of gangrene to his right fifth toe. I was consulted yesterday and had noted foul odor and infection to the area. There was a slight amount of gas in the soft tissues to the fifth toe joint. I did a bedside incision and drainage and opened and released the draining area. We put him on for the OR today for amputation of this area and debridement. All risks, benefits, alternatives, and complications associated have been discussed with the patient and full consent has been given.   DESCRIPTION OF PROCEDURE: The patient was brought into the Operating Room and placed on the operating table in the supine position. IV sedation was administered by the anesthesia team. The right lower extremity was then prepped and draped in the usual sterile fashion. Attention was directed to the fifth toe where this was markedly gangrenous. A full thickness incision was made circumferential around the fifth toe and joint. Full thickness dissection was taken down to the metatarsal. This was then osteotomized and the distal portion of the toe and joint and metatarsal were removed from the surgical field in toto. There was still noted to be residual dead, necrotic foul smelling soft tissue in this area. Further debridement was undertaken. There was noted to be  extension to the dorsal plantar aspect of the fourth toe joint. I did debride this out and drain this out nicely. I used the VersaJet to further debride out this wound. I left this wide open to pack as this will need further debridement down the road. We will have to monitor this. I packed it with gauze and a bulky dressing.   I will see him back on the floor and restart his antibiotics. He will be followed over the next several days. He will likely need at minimum further debridement and possible transmetatarsal amputation given the extension of his infectious process to the fourth toe and joint area.  ____________________________ Pete Glatter. Vickki Muff, DPM jaf:slb D: 08/09/2011 16:16:32 ET T: 08/09/2011 16:46:12 ET JOB#: [id number]  cc: Larkin Ina A. Vickki Muff, DPM, <Dictator> Loudon Krakow DPM ELECTRONICALLY SIGNED 09/08/2011 10:51

## 2014-05-24 NOTE — Consult Note (Signed)
PATIENT NAME:  Benjamin Davidson, Benjamin Davidson MR#:  [id number] DATE OF BIRTH:  February 17, 1950  DATE OF CONSULTATION:  08/08/2011  REFERRING PHYSICIAN:   CONSULTING PHYSICIAN:  Delylah Stanczyk A. Vickki Muff, DPM  REASON FOR CONSULTATION: Right foot gangrene.   HISTORY OF PRESENT ILLNESS: This is a 64 year old who was recently hospitalized with gangrenous changes of his right fifth toe. He stated he noticed a blister forming on his right foot a month ago. He was seen in his primary care physician's office and he had been putting some topical ointment on it. He states last week it turned dark and it became worse. He was seen in the ER yesterday, admitted to the hospital, and started on IV antibiotics.   PAST MEDICAL HISTORY:  1. History of left fifth toe ray amputation with peripheral vascular disease. 2. Cardiovascular disease, status post coronary artery bypass graft.  3. History of deep vein thrombosis and pulmonary embolism. 4. Depression.  5. Hyperlipidemia.  6. Type 2 diabetes. 7. Benign hypertension. 8. Congestive heart failure.   MEDICATIONS: Zoloft, Coumadin, Micardis, HCTZ, Humulin, Bystolic, Crestor, aspirin, and Lasix.   ALLERGIES: Lisinopril.   SOCIAL HISTORY: He denies smoking or alcohol. He is on disability.   REVIEW OF SYSTEMS: He states he has had a couple of days of chills. No severe fever. He is numb to the lower extremities. He lives in a senior citizens living facility, but not a skilled nursing facility.   PHYSICAL EXAMINATION:   GENERAL: He is alert and oriented, in no apparent distress.   VASCULAR: He has a fully palpable dorsalis pedis pulse. He has a barely palpable posterior tibial pulse secondary to edema, on his right side.   NEUROLOGIC: He is completely neuropathic to lower extremities.   DERMATOLOGIC: He has noted gangrenous changes to the metatarsophalangeal joint. There is erythema coursing to the dorsal mid foot to about the level of the ankle. There is a plantar ulceration on the  MTPJ as well. This does probe deep into the joint, bone, and dorsal foot.   MUSCULOSKELETAL: Diffuse edema to the right foot and ankle.  LABORATORY, DIAGNOSTIC AND RADIOLOGIC DATA: Lab results show an elevated white blood cell count at 17,000 with a left shift.  X-ray results show concern for osteomyelitis in the fifth metatarsal head with cellulitis into the right forefoot area. There is gas likely associated with the ulcerative site. I will have to personally evaluate this x-ray though.  ASSESSMENT: Gangrenous changes, right fifth MTPJ and toe with cellulitis abscess and infection in this diabetic, neuropathic male with peripheral vascular disease.   PLAN: I performed a bedside incision and drainage. I was able to open the fifth metatarsophalangeal joint to remove some purulent drainage and remove any overlying gaseous areas. I did perform a culture. We will have him n.p.o. tonight. We will plan on surgical intervention tomorrow with a fifth ray amputation and incision and drainage. I did discuss this with the patient in detail. Multiple questions were answered. I will see him back tomorrow for surgical intervention.  ____________________________ Pete Glatter Vickki Muff, DPM jaf:slb D: 08/08/2011 13:50:23 ET     T: 08/08/2011 14:10:29 ET        JOB#: [id number] cc: Larkin Ina A. Vickki Muff, DPM, <Dictator> Odyn Turko DPM ELECTRONICALLY SIGNED 09/08/2011 10:51

## 2014-05-24 NOTE — Discharge Summary (Signed)
PATIENT NAME:  Benjamin Davidson, Benjamin Davidson MR#:  [id number] DATE OF BIRTH:  [birth year]  DATE OF ADMISSION:  02/23/2011 DATE OF DISCHARGE:  02/25/2011  For a detailed note, please see the History and Physical done by Dr. Loletha Grayer on admission.   DISCHARGE DIAGNOSES:  1. Left lower extremity calf diabetic ulcer.  2. Uncontrolled diabetes.  3. Hypertension.  4. History of deep venous thrombosis/ pulmonary embolism.  5. History of congestive heart failure.  6. Depression.  7. Hyperlipidemia.   DIET: The patient is being discharged on a low-sodium, American Diabetic Association low-fat diet.   ACTIVITY: As tolerated.   FOLLOWUP:  1. Follow up with Dr. Bronson Ing in the next 2 to 3 days. 2. Follow up with Dr. Laurian Brim in the next 1 to 2 weeks.    DISCHARGE MEDICATIONS: 1. Furosemide 20 mg as needed for swelling. 2. Humulin 70/30, 35 units b.i.d. 3. HCTZ 25 mg daily.  4. Warfarin 5 mg daily.  5. Micardis/HCTZ 80/12.5, 1 tab daily.  6. Crestor 20 mg daily.  7. Zoloft 100 mg daily.  8. Bystolic 10 mg daily.  9. Aspirin 325 mg daily.  10. Bactrim double strength 1 tab b.i.d. times 10 days.  11. Norco 5/325 1 to 2 tabs q. 6 h. p.r.n. pain.   DRESSING CHANGES:  The patient is to apply wet to dry dressings 2 to 3 times daily. He may shower. Keep the wound covered.   CONSULTANTS:  1. Dr. Phoebe Perch from general surgery. 2. Dr. Leanora Cover from general surgery.   PERTINENT STUDIES: Wound culture positive for methicillin sensitive staph aureus. Blood cultures negative.  X-ray of the tibia and fibula showing no evidence of any bony abnormalities, soft tissue lucency in the mid fibula on the left, no evidence of periosteal elevation or bony destruction.   HOSPITAL COURSE:  1. This is a 64 year old male with medical problems as mentioned above who presented to the hospital on 01/24 secondary to a diabetic ulcer on the left lower extremity. The patient was initially started empirically on  vancomycin and Unasyn for treatment for his foot ulcer.  A surgical consult was obtained. The patient was seen by Dr. Burt Knack and also Dr. Leanora Cover and they did not believe the patient needed any surgical debridement at this point. They recommended continuing wet to dry dressings and also continuing p.o. antibiotics. His wound culture did grow methicillin sensitive staph aureus. Therefore he is being discharged on p.o. Bactrim. He has remained afebrile and his white cell count has remained stable. His x-ray did not show any evidence of any bone involvement or osteomyelitis. The patient will follow up with general surgery as an outpatient in the next 2 to 3 days. I have also arranged for Home Health services to help him with his wound and dressing changes at home.  2. Diabetes:  His diabetes was severely uncontrolled with hemoglobin A1c of 11. Some of this could be related to his active infection and foot ulcer. He will continue his Humulin 70/30 and further titrations can be done by his primary care physician, Dr. Hardin Negus.  3. Hypertension: The patient remained hemodynamically stable during the hospital course.  He will continue his Micardis and HCTZ and Bystolic, which he was maintained on during the hospital course.  4. History of deep vein thrombosis and pulmonary embolus: This was many years ago. I am not quite sure why the patient is still continuing to take Coumadin. I advised that we discontinue it, but he does  not feel comfortable doing that at this time. He would rather discuss this with his cardiologist Dr. Rockey Situ and his primary care physician prior to discontinuing his Coumadin. His INR has been close to therapeutic; today it is 1.9.  5. Hyperlipidemia: The patient was maintained on his Crestor. He will continue that.  6. Depression: The patient was maintained on his Zoloft and he will continue that, too.   CODE STATUS: The patient is a FULL CODE.    TIME SPENT: 45 minutes.     ____________________________ Belia Heman. Verdell Carmine, MD [id number] D: 02/25/2011 14:24:53 ET T: 02/25/2011 15:28:36 ET JOB#: [id number]  cc: Belia Heman. Verdell Carmine, MD, <Dictator> Micheline Maze, MD Morton Peters., MD Henreitta Leber MD ELECTRONICALLY SIGNED 03/04/2011 8:21

## 2014-06-01 ENCOUNTER — Other Ambulatory Visit: Payer: Self-pay | Admitting: *Deleted

## 2014-06-01 MED ORDER — HYDROCHLOROTHIAZIDE 25 MG PO TABS: 25.0000 mg | ORAL_TABLET | Freq: Every day | ORAL | Status: DC

## 2014-06-04 ENCOUNTER — Other Ambulatory Visit (INDEPENDENT_AMBULATORY_CARE_PROVIDER_SITE_OTHER): Payer: Medicare HMO

## 2014-06-04 ENCOUNTER — Telehealth: Payer: Self-pay | Admitting: Internal Medicine

## 2014-06-04 DIAGNOSIS — I82409 Acute embolism and thrombosis of unspecified deep veins of unspecified lower extremity: Secondary | ICD-10-CM | POA: Diagnosis not present

## 2014-06-04 DIAGNOSIS — Z7901 Long term (current) use of anticoagulants: Secondary | ICD-10-CM | POA: Diagnosis not present

## 2014-06-04 LAB — POCT INR: INR: 1.8

## 2014-06-04 NOTE — Telephone Encounter (Signed)
Pt dropped off handicap form for parking  On dee's desk

## 2014-06-04 NOTE — Telephone Encounter (Signed)
Form done No charge 

## 2014-06-04 NOTE — Telephone Encounter (Signed)
Form on your desk  

## 2014-06-05 NOTE — Telephone Encounter (Signed)
I left patient a voice mail letting him know form is ready to be picked up.

## 2014-07-09 ENCOUNTER — Other Ambulatory Visit (INDEPENDENT_AMBULATORY_CARE_PROVIDER_SITE_OTHER): Payer: Medicare HMO

## 2014-07-09 DIAGNOSIS — Z7901 Long term (current) use of anticoagulants: Secondary | ICD-10-CM | POA: Diagnosis not present

## 2014-07-09 DIAGNOSIS — I82409 Acute embolism and thrombosis of unspecified deep veins of unspecified lower extremity: Secondary | ICD-10-CM | POA: Diagnosis not present

## 2014-07-09 LAB — POCT INR: INR: 2.8

## 2014-07-13 ENCOUNTER — Other Ambulatory Visit: Payer: Self-pay | Admitting: *Deleted

## 2014-07-13 MED ORDER — WARFARIN SODIUM 5 MG PO TABS: ORAL_TABLET | ORAL | Status: DC

## 2014-07-31 ENCOUNTER — Other Ambulatory Visit: Payer: Self-pay

## 2014-07-31 MED ORDER — FUROSEMIDE 20 MG PO TABS: ORAL_TABLET | ORAL | Status: DC

## 2014-07-31 NOTE — Telephone Encounter (Signed)
Refill sent for furosemide  

## 2014-08-06 ENCOUNTER — Ambulatory Visit (INDEPENDENT_AMBULATORY_CARE_PROVIDER_SITE_OTHER): Payer: Medicare HMO | Admitting: *Deleted

## 2014-08-06 DIAGNOSIS — Z7901 Long term (current) use of anticoagulants: Secondary | ICD-10-CM

## 2014-08-06 DIAGNOSIS — Z5181 Encounter for therapeutic drug level monitoring: Secondary | ICD-10-CM

## 2014-08-06 DIAGNOSIS — Z86718 Personal history of other venous thrombosis and embolism: Secondary | ICD-10-CM | POA: Diagnosis not present

## 2014-08-06 LAB — POCT INR: INR: 3

## 2014-08-06 NOTE — Progress Notes (Signed)
Pre visit review using our clinic review tool, if applicable. No additional management support is needed unless otherwise documented below in the visit note. 

## 2014-08-24 ENCOUNTER — Ambulatory Visit (INDEPENDENT_AMBULATORY_CARE_PROVIDER_SITE_OTHER): Payer: Medicare HMO | Admitting: Family Medicine

## 2014-08-24 ENCOUNTER — Encounter: Payer: Self-pay | Admitting: Family Medicine

## 2014-08-24 ENCOUNTER — Encounter (INDEPENDENT_AMBULATORY_CARE_PROVIDER_SITE_OTHER): Payer: Self-pay

## 2014-08-24 VITALS — BP 104/60 | HR 69 | Temp 98.4°F | Wt 245.8 lb

## 2014-08-24 DIAGNOSIS — R42 Dizziness and giddiness: Secondary | ICD-10-CM | POA: Diagnosis not present

## 2014-08-24 DIAGNOSIS — I10 Essential (primary) hypertension: Secondary | ICD-10-CM | POA: Diagnosis not present

## 2014-08-24 LAB — BASIC METABOLIC PANEL
BUN: 26 mg/dL — ABNORMAL HIGH (ref 6–23)
CALCIUM: 9.4 mg/dL (ref 8.4–10.5)
CO2: 32 mEq/L (ref 19–32)
Chloride: 98 mEq/L (ref 96–112)
Creatinine, Ser: 1.15 mg/dL (ref 0.40–1.50)
GFR: 68.34 mL/min (ref >60.00)
Glucose, Bld: 158 mg/dL — ABNORMAL HIGH (ref 70–99)
Potassium: 3.7 mEq/L (ref 3.5–5.1)
SODIUM: 138 meq/L (ref 135–145)

## 2014-08-24 LAB — CBC WITH DIFFERENTIAL/PLATELET
BASOS ABS: 0 10*3/uL (ref 0.0–0.1)
BASOS PCT: 0.4 % (ref 0.0–3.0)
Eosinophils Absolute: 0.3 10*3/uL (ref 0.0–0.7)
Eosinophils Relative: 4.1 % (ref 0.0–5.0)
HCT: 35.6 % — ABNORMAL LOW (ref 39.0–52.0)
Hemoglobin: 12.1 g/dL — ABNORMAL LOW (ref 13.0–17.0)
Lymphocytes Relative: 23.6 % (ref 12.0–46.0)
Lymphs Abs: 1.6 10*3/uL (ref 0.7–4.0)
MCHC: 33.9 g/dL (ref 30.0–36.0)
MCV: 81.2 fl (ref 78.0–100.0)
Monocytes Absolute: 0.5 10*3/uL (ref 0.1–1.0)
Monocytes Relative: 7.2 % (ref 3.0–12.0)
NEUTROS ABS: 4.5 10*3/uL (ref 1.4–7.7)
Neutrophils Relative %: 64.7 % (ref 43.0–77.0)
Platelets: 275 10*3/uL (ref 150.0–400.0)
RBC: 4.38 Mil/uL (ref 4.22–5.81)
RDW: 14.6 % (ref 11.5–15.5)
WBC: 6.9 10*3/uL (ref 4.0–10.5)

## 2014-08-24 MED ORDER — ROSUVASTATIN CALCIUM 40 MG PO TABS: 40.0000 mg | ORAL_TABLET | Freq: Every day | ORAL | Status: DC

## 2014-08-24 NOTE — Patient Instructions (Addendum)
Stop the plain hydrochlorothiazide.  Continue losartan/hydrochlorothiazide.  Go to the lab on the way out.  We'll contact you with your lab report. If you keep having lightheaded spells, then update [place].  Take care.  Glad to see you.  Thanks for your effort working on your weight.

## 2014-08-24 NOTE — Progress Notes (Signed)
Pre visit review using our clinic review tool, if applicable. No additional management support is needed unless otherwise documented below in the visit note.  In the last week, lightheaded, fatigued.  No CP.  He is SOB with exertion in the last week.  No wheeze.  occ BLE edema recently- he had more salt intake recently.  No syncope but had presyncope.  The room doesn't spin.  He doesn't have sx when laying flat in the bed.  Sx are worse right when he stands.  No bleeding, no black stools.  He is intentionally working on his weight with diet.   Meds, vitals, and allergies reviewed.   ROS: See HPI.  Otherwise, noncontributory.  GEN: nad, alert and oriented HEENT: mucous membranes moist NECK: supple w/o LA CV: rrr. PULM: ctab, no inc wob ABD: soft, +bs EXT: no edema

## 2014-08-24 NOTE — Assessment & Plan Note (Signed)
Likely overtreated with weight loss noted.  Stop HCTZ, continue other meds.  If more lightheaded spells, then update [place].  He agrees. See notes on labs.

## 2014-09-03 ENCOUNTER — Telehealth: Payer: Self-pay | Admitting: Internal Medicine

## 2014-09-03 ENCOUNTER — Ambulatory Visit (INDEPENDENT_AMBULATORY_CARE_PROVIDER_SITE_OTHER): Payer: Medicare HMO | Admitting: *Deleted

## 2014-09-03 DIAGNOSIS — Z5181 Encounter for therapeutic drug level monitoring: Secondary | ICD-10-CM | POA: Diagnosis not present

## 2014-09-03 DIAGNOSIS — Z86718 Personal history of other venous thrombosis and embolism: Secondary | ICD-10-CM | POA: Diagnosis not present

## 2014-09-03 DIAGNOSIS — Z7901 Long term (current) use of anticoagulants: Secondary | ICD-10-CM

## 2014-09-03 DIAGNOSIS — I82409 Acute embolism and thrombosis of unspecified deep veins of unspecified lower extremity: Secondary | ICD-10-CM | POA: Diagnosis not present

## 2014-09-03 LAB — POCT INR: INR: 3.4

## 2014-09-03 NOTE — Telephone Encounter (Signed)
Patient did not come in for their appointment today for PT INR.  Please let me know if patient needs to be contacted immediately for follow up or no follow up needed. °

## 2014-09-03 NOTE — Progress Notes (Signed)
Pre visit review using our clinic review tool, if applicable. No additional management support is needed unless otherwise documented below in the visit note. 

## 2014-09-03 NOTE — Telephone Encounter (Signed)
Patient called back.  Was notified of missed appointment.  Will work in this afternoon.

## 2014-09-03 NOTE — Telephone Encounter (Signed)
Called patient to reschedule appointment.  Left message to call back.  Needs appointment asap.

## 2014-10-01 ENCOUNTER — Ambulatory Visit (INDEPENDENT_AMBULATORY_CARE_PROVIDER_SITE_OTHER): Payer: Medicare HMO | Admitting: *Deleted

## 2014-10-01 ENCOUNTER — Ambulatory Visit: Payer: Medicare HMO

## 2014-10-01 DIAGNOSIS — Z86718 Personal history of other venous thrombosis and embolism: Secondary | ICD-10-CM | POA: Diagnosis not present

## 2014-10-01 DIAGNOSIS — Z5181 Encounter for therapeutic drug level monitoring: Secondary | ICD-10-CM | POA: Diagnosis not present

## 2014-10-01 DIAGNOSIS — I82409 Acute embolism and thrombosis of unspecified deep veins of unspecified lower extremity: Secondary | ICD-10-CM

## 2014-10-01 DIAGNOSIS — Z7901 Long term (current) use of anticoagulants: Secondary | ICD-10-CM | POA: Diagnosis not present

## 2014-10-01 LAB — POCT INR: INR: 4

## 2014-10-01 NOTE — Progress Notes (Signed)
Pre visit review using our clinic review tool, if applicable. No additional management support is needed unless otherwise documented below in the visit note. 

## 2014-10-15 ENCOUNTER — Ambulatory Visit (INDEPENDENT_AMBULATORY_CARE_PROVIDER_SITE_OTHER): Payer: Medicare HMO | Admitting: *Deleted

## 2014-10-15 DIAGNOSIS — Z5181 Encounter for therapeutic drug level monitoring: Secondary | ICD-10-CM

## 2014-10-15 DIAGNOSIS — I82409 Acute embolism and thrombosis of unspecified deep veins of unspecified lower extremity: Secondary | ICD-10-CM | POA: Diagnosis not present

## 2014-10-15 DIAGNOSIS — Z7901 Long term (current) use of anticoagulants: Secondary | ICD-10-CM | POA: Diagnosis not present

## 2014-10-15 DIAGNOSIS — Z86718 Personal history of other venous thrombosis and embolism: Secondary | ICD-10-CM | POA: Diagnosis not present

## 2014-10-15 LAB — POCT INR: INR: 3

## 2014-10-15 NOTE — Progress Notes (Signed)
Pre visit review using our clinic review tool, if applicable. No additional management support is needed unless otherwise documented below in the visit note. 

## 2014-10-27 ENCOUNTER — Other Ambulatory Visit: Payer: Self-pay | Admitting: Internal Medicine

## 2014-11-11 ENCOUNTER — Other Ambulatory Visit: Payer: Self-pay | Admitting: Internal Medicine

## 2014-11-12 ENCOUNTER — Ambulatory Visit (INDEPENDENT_AMBULATORY_CARE_PROVIDER_SITE_OTHER): Payer: Medicare HMO | Admitting: *Deleted

## 2014-11-12 DIAGNOSIS — I82409 Acute embolism and thrombosis of unspecified deep veins of unspecified lower extremity: Secondary | ICD-10-CM

## 2014-11-12 DIAGNOSIS — Z5181 Encounter for therapeutic drug level monitoring: Secondary | ICD-10-CM

## 2014-11-12 DIAGNOSIS — Z7901 Long term (current) use of anticoagulants: Secondary | ICD-10-CM

## 2014-11-12 DIAGNOSIS — Z86718 Personal history of other venous thrombosis and embolism: Secondary | ICD-10-CM | POA: Diagnosis not present

## 2014-11-12 LAB — POCT INR: INR: 2.9

## 2014-11-12 NOTE — Progress Notes (Signed)
Pre visit review using our clinic review tool, if applicable. No additional management support is needed unless otherwise documented below in the visit note. 

## 2014-11-30 ENCOUNTER — Other Ambulatory Visit: Payer: Self-pay | Admitting: Internal Medicine

## 2014-11-30 ENCOUNTER — Other Ambulatory Visit: Payer: Self-pay | Admitting: Cardiovascular Disease

## 2014-12-11 ENCOUNTER — Other Ambulatory Visit: Payer: Self-pay | Admitting: Internal Medicine

## 2014-12-11 ENCOUNTER — Other Ambulatory Visit: Payer: Self-pay | Admitting: Cardiovascular Disease

## 2014-12-16 DIAGNOSIS — H4311 Vitreous hemorrhage, right eye: Secondary | ICD-10-CM | POA: Diagnosis not present

## 2014-12-16 DIAGNOSIS — E113511 Type 2 diabetes mellitus with proliferative diabetic retinopathy with macular edema, right eye: Secondary | ICD-10-CM | POA: Diagnosis not present

## 2014-12-17 ENCOUNTER — Encounter: Payer: Self-pay | Admitting: Internal Medicine

## 2014-12-17 ENCOUNTER — Ambulatory Visit (INDEPENDENT_AMBULATORY_CARE_PROVIDER_SITE_OTHER): Payer: Medicare HMO | Admitting: *Deleted

## 2014-12-17 ENCOUNTER — Ambulatory Visit (INDEPENDENT_AMBULATORY_CARE_PROVIDER_SITE_OTHER): Payer: Medicare HMO | Admitting: Internal Medicine

## 2014-12-17 ENCOUNTER — Encounter (INDEPENDENT_AMBULATORY_CARE_PROVIDER_SITE_OTHER): Payer: Self-pay

## 2014-12-17 VITALS — BP 140/80 | HR 74 | Temp 98.1°F | Ht 70.0 in | Wt 250.0 lb

## 2014-12-17 DIAGNOSIS — IMO0002 Reserved for concepts with insufficient information to code with codable children: Secondary | ICD-10-CM

## 2014-12-17 DIAGNOSIS — Z0000 Encounter for general adult medical examination without abnormal findings: Secondary | ICD-10-CM | POA: Insufficient documentation

## 2014-12-17 DIAGNOSIS — I739 Peripheral vascular disease, unspecified: Secondary | ICD-10-CM | POA: Diagnosis not present

## 2014-12-17 DIAGNOSIS — E113599 Type 2 diabetes mellitus with proliferative diabetic retinopathy without macular edema, unspecified eye: Secondary | ICD-10-CM | POA: Insufficient documentation

## 2014-12-17 DIAGNOSIS — E1122 Type 2 diabetes mellitus with diabetic chronic kidney disease: Secondary | ICD-10-CM | POA: Diagnosis not present

## 2014-12-17 DIAGNOSIS — Z5181 Encounter for therapeutic drug level monitoring: Secondary | ICD-10-CM | POA: Diagnosis not present

## 2014-12-17 DIAGNOSIS — N183 Chronic kidney disease, stage 3 unspecified: Secondary | ICD-10-CM

## 2014-12-17 DIAGNOSIS — Z89429 Acquired absence of other toe(s), unspecified side: Secondary | ICD-10-CM | POA: Diagnosis not present

## 2014-12-17 DIAGNOSIS — E1142 Type 2 diabetes mellitus with diabetic polyneuropathy: Secondary | ICD-10-CM | POA: Diagnosis not present

## 2014-12-17 DIAGNOSIS — Z86718 Personal history of other venous thrombosis and embolism: Secondary | ICD-10-CM

## 2014-12-17 DIAGNOSIS — Z23 Encounter for immunization: Secondary | ICD-10-CM | POA: Diagnosis not present

## 2014-12-17 DIAGNOSIS — Z1211 Encounter for screening for malignant neoplasm of colon: Secondary | ICD-10-CM

## 2014-12-17 DIAGNOSIS — I82409 Acute embolism and thrombosis of unspecified deep veins of unspecified lower extremity: Secondary | ICD-10-CM

## 2014-12-17 DIAGNOSIS — Z7901 Long term (current) use of anticoagulants: Secondary | ICD-10-CM

## 2014-12-17 LAB — HM DIABETES FOOT EXAM

## 2014-12-17 LAB — POCT INR: INR: 4.2

## 2014-12-17 MED ORDER — GABAPENTIN 300 MG PO CAPS: 300.0000 mg | ORAL_CAPSULE | Freq: Two times a day (BID) | ORAL | Status: DC

## 2014-12-17 NOTE — Assessment & Plan Note (Signed)
Hopefully still acceptable control Due for labs Foot pain controlled with gabapentin

## 2014-12-17 NOTE — Progress Notes (Signed)
Pre visit review using our clinic review tool, if applicable. No additional management support is needed unless otherwise documented below in the visit note. 

## 2014-12-17 NOTE — Progress Notes (Signed)
Subjective:    Patient ID: Benjamin Davidson, male    DOB: Jul 25, 1950, 64 y.o.   MRN: [id number]  HPI Here for Medicare wellness visit and follow up of chronic medical conditions Reviewed advanced directives Other doctors-- Dr Rockey Situ for cardiology, Dr Oval Linsey at [facility] No hospital stays or surgery in past year meds reviewed No alcohol or tobacco No regular exercise--feels his vision problems are the issue getting to gym Doesn't drive often due to vision Does all his instrumental ADLs--someone brings him for grocery shopping No falls Poor vision as noted Hearing is okay No memory problems  Has noticed some shaking  Doesn't feel nervous He only notices in hands Doesn't affect his eating and drinking or other activities Dad did have shaking also (though alcoholic) No bradykinesia Wobbly walking at times (but not Parkinsonian)  No leg swelling Still with pain in feet-- gabapentin helps Continues on the coumadin  Checks sugars twice a day Usually 120's but rarely over 200 No hypoglycemic reactions  No chest pain No palpitations No dizziness or syncope--since going off HCTZ  Depression seems controlled Not anhedonic--but "I don't do very much"  Current Outpatient Prescriptions on File Prior to Visit  Medication Sig Dispense Refill  . aspirin EC 325 MG EC tablet Take 325 mg by mouth daily.      Marland Kitchen BLACK CURRANT SEED OIL PO Take by mouth.    . carvedilol (COREG) 6.25 MG tablet TAKE 1 TABLET BY MOUTH TWICE A DAY 180 tablet 0  . DEVILS CLAW PO Take by mouth daily.    . furosemide (LASIX) 20 MG tablet Take one tablet by mouth every day. If increased swelling, may take one additional tablet daily as needed. 60 tablet 3  . gabapentin (NEURONTIN) 300 MG capsule Take 300 mg by mouth 2 (two) times daily.     . insulin NPH-insulin regular (NOVOLIN 70/30) (70-30) 100 UNIT/ML injection Inject 35 Units into the skin 2 (two) times daily with a meal.      . Insulin Pen Needle  (NOVOFINE 31) 31G X 6 MM MISC by Does not apply route.      Marland Kitchen losartan-hydrochlorothiazide (HYZAAR) 100-12.5 MG tablet TAKE 1 TABLET BY MOUTH ONCE A DAY 90 tablet 0  . metFORMIN (GLUCOPHAGE) 500 MG tablet TAKE 1 TABLET BY MOUTH TWICE A DAY WITH A MEAL 60 tablet 0  . NOVOLIN R 100 UNIT/ML injection as needed.     . rosuvastatin (CRESTOR) 40 MG tablet Take 1 tablet (40 mg total) by mouth daily.    . sertraline (ZOLOFT) 100 MG tablet Take 100 mg by mouth daily.      Marland Kitchen warfarin (COUMADIN) 5 MG tablet Take as directed by anticoagulation clinic. 120 tablet 0   No current facility-administered medications on file prior to visit.    Allergies  Allergen Reactions  . Lisinopril     Cough Flu like symptoms     Past Medical History  Diagnosis Date  . Pure hypercholesterolemia   . MDD (recurrent major depressive disorder) in remission ([place])   . Left leg DVT ([place])   . Coronary artery disease   . DM (diabetes mellitus), type 2, uncontrolled, periph vascular complic ([place])     Type II  . Hypertension   . CHF (congestive heart failure) ([place])   . Peripheral vascular disease ([facility])     Left toe amputations. Dr Lucky Cowboy    Past Surgical History  Procedure Laterality Date  . Coronary artery bypass graft    .  Toe amputation      left 5th, right 4th and 5th  . Percutaneous stent intervention      right leg  . Peripherally inserted central catheter insertion  2013    Family History  Problem Relation Age of Onset  . Hyperlipidemia Brother     Social History   Social History  . Marital Status: Divorced    Spouse Name: N/A  . Number of Children: 0  . Years of Education: N/A   Occupational History  . [name]     Disabled since 2000's   Social History Main Topics  . Smoking status: Never Smoker   . Smokeless tobacco: Never Used  . Alcohol Use: No  . Drug Use: No  . Sexual Activity: Not on file   Other Topics Concern  . Not on file   Social History Narrative   No living will    Requests [name], sister, as health care POA.   Would accept resuscitation attempts   Not sure about tube feeds   Review of Systems Hasn't been to dentist--- thinks his teeth are okay Wears seat belt No sig joint or back pain No rashes but scratches head a lot Sleeps okay Appetite is okay Weight is stable    Objective:   Physical Exam  Constitutional: He is oriented to person, place, and time. He appears well-developed and well-nourished. No distress.  HENT:  Mouth/Throat: Oropharynx is clear and moist. No oropharyngeal exudate.  Neck: Normal range of motion. Neck supple. No thyromegaly present.  Cardiovascular: Normal rate, regular rhythm and normal heart sounds.  Exam reveals no gallop.   No murmur heard. Feet warm Only faint DP pulse in right foot--none on left  Pulmonary/Chest: Effort normal and breath sounds normal. No respiratory distress. He has no wheezes. He has no rales.  Abdominal: Soft. There is no tenderness.  Musculoskeletal: He exhibits no edema or tenderness.  Lymphadenopathy:    He has no cervical adenopathy.  Neurological: He is alert and oriented to person, place, and time.  President-- "Obama, Bush, Clinton" 100-93-? D-l-o-r-w Recall 3/3  Decreased sensation in feet  Skin: No rash noted. No erythema.  Small slit under left 3rd toe--not infected Amputation sites left 5th, right 4th and 5th all clean and dry  Psychiatric: He has a normal mood and affect. His behavior is normal.          Assessment & Plan:

## 2014-12-17 NOTE — Addendum Note (Signed)
Addended by: Despina Hidden on: 12/17/2014 04:31 PM   Modules accepted: Orders

## 2014-12-17 NOTE — Assessment & Plan Note (Signed)
No rest pain No exercise Amputation sites clean

## 2014-12-17 NOTE — Assessment & Plan Note (Signed)
Continued Rx at eye doctor

## 2014-12-17 NOTE — Assessment & Plan Note (Signed)
Better last time Still on ACEI Will recheck

## 2014-12-17 NOTE — Assessment & Plan Note (Signed)
I have personally reviewed the Medicare Annual Wellness questionnaire and have noted 1. The patient's medical and social history 2. Their use of alcohol, tobacco or illicit drugs 3. Their current medications and supplements 4. The patient's functional ability including ADL's, fall risks, home safety risks and hearing or visual             impairment. 5. Diet and physical activities 6. Evidence for depression or mood disorders  The patients weight, height, BMI and visual acuity have been recorded in the chart I have made referrals, counseling and provided education to the patient based review of the above and I have provided the pt with a written personalized care plan for preventive services.  I have provided you with a copy of your personalized plan for preventive services. Please take the time to review along with your updated medication list.  Flu vaccine today Defer PSA till at least next year Fecal immunoassay

## 2014-12-17 NOTE — Assessment & Plan Note (Signed)
Sites clean Small slit under left 3rd---discussed topical care

## 2014-12-18 LAB — CBC WITH DIFFERENTIAL/PLATELET
BASOS ABS: 0 10*3/uL (ref 0.0–0.1)
BASOS PCT: 0.2 % (ref 0.0–3.0)
EOS ABS: 0.1 10*3/uL (ref 0.0–0.7)
Eosinophils Relative: 1.6 % (ref 0.0–5.0)
HEMATOCRIT: 37 % — AB (ref 39.0–52.0)
Hemoglobin: 12.2 g/dL — ABNORMAL LOW (ref 13.0–17.0)
LYMPHS ABS: 1.9 10*3/uL (ref 0.7–4.0)
Lymphocytes Relative: 21.2 % (ref 12.0–46.0)
MCHC: 32.9 g/dL (ref 30.0–36.0)
MCV: 80.7 fl (ref 78.0–100.0)
MONOS PCT: 6.3 % (ref 3.0–12.0)
Monocytes Absolute: 0.6 10*3/uL (ref 0.1–1.0)
NEUTROS ABS: 6.5 10*3/uL (ref 1.4–7.7)
Neutrophils Relative %: 70.7 % (ref 43.0–77.0)
PLATELETS: 288 10*3/uL (ref 150.0–400.0)
RBC: 4.59 Mil/uL (ref 4.22–5.81)
RDW: 14.4 % (ref 11.5–15.5)
WBC: 9.1 10*3/uL (ref 4.0–10.5)

## 2014-12-18 LAB — LIPID PANEL
CHOL/HDL RATIO: 3
Cholesterol: 97 mg/dL (ref 0–200)
HDL: 37.6 mg/dL — ABNORMAL LOW (ref >39.00)
LDL CALC: 40 mg/dL (ref 0–99)
NonHDL: 59.26
TRIGLYCERIDES: 97 mg/dL (ref 0.0–149.0)
VLDL: 19.4 mg/dL (ref 0.0–40.0)

## 2014-12-18 LAB — COMPREHENSIVE METABOLIC PANEL
ALT: 25 U/L (ref 0–53)
AST: 22 U/L (ref 0–37)
Albumin: 4.1 g/dL (ref 3.5–5.2)
Alkaline Phosphatase: 48 U/L (ref 39–117)
BILIRUBIN TOTAL: 0.4 mg/dL (ref 0.2–1.2)
BUN: 29 mg/dL — ABNORMAL HIGH (ref 6–23)
CHLORIDE: 100 meq/L (ref 96–112)
CO2: 30 meq/L (ref 19–32)
CREATININE: 1.36 mg/dL (ref 0.40–1.50)
Calcium: 9.7 mg/dL (ref 8.4–10.5)
GFR: 56.26 mL/min — ABNORMAL LOW (ref >60.00)
GLUCOSE: 131 mg/dL — AB (ref 70–99)
Potassium: 3.9 mEq/L (ref 3.5–5.1)
Sodium: 140 mEq/L (ref 135–145)
Total Protein: 7 g/dL (ref 6.0–8.3)

## 2014-12-18 LAB — HEMOGLOBIN A1C: Hgb A1c MFr Bld: 7.3 % — ABNORMAL HIGH (ref 4.6–6.5)

## 2014-12-18 LAB — T4, FREE: Free T4: 0.77 ng/dL (ref 0.60–1.60)

## 2014-12-21 ENCOUNTER — Ambulatory Visit (INDEPENDENT_AMBULATORY_CARE_PROVIDER_SITE_OTHER): Payer: Medicare HMO | Admitting: Cardiovascular Disease

## 2014-12-21 ENCOUNTER — Encounter: Payer: Self-pay | Admitting: Cardiovascular Disease

## 2014-12-21 VITALS — BP 100/62 | HR 68 | Ht 70.0 in | Wt 248.8 lb

## 2014-12-21 DIAGNOSIS — E785 Hyperlipidemia, unspecified: Secondary | ICD-10-CM | POA: Diagnosis not present

## 2014-12-21 DIAGNOSIS — E1122 Type 2 diabetes mellitus with diabetic chronic kidney disease: Secondary | ICD-10-CM | POA: Diagnosis not present

## 2014-12-21 DIAGNOSIS — I25810 Atherosclerosis of coronary artery bypass graft(s) without angina pectoris: Secondary | ICD-10-CM | POA: Diagnosis not present

## 2014-12-21 DIAGNOSIS — E1142 Type 2 diabetes mellitus with diabetic polyneuropathy: Secondary | ICD-10-CM

## 2014-12-21 DIAGNOSIS — N183 Chronic kidney disease, stage 3 unspecified: Secondary | ICD-10-CM

## 2014-12-21 DIAGNOSIS — I10 Essential (primary) hypertension: Secondary | ICD-10-CM | POA: Diagnosis not present

## 2014-12-21 DIAGNOSIS — R0602 Shortness of breath: Secondary | ICD-10-CM

## 2014-12-21 MED ORDER — ROSUVASTATIN CALCIUM 20 MG PO TABS: 20.0000 mg | ORAL_TABLET | Freq: Every day | ORAL | Status: DC

## 2014-12-21 MED ORDER — LOSARTAN POTASSIUM 25 MG PO TABS: 25.0000 mg | ORAL_TABLET | Freq: Every day | ORAL | Status: DC

## 2014-12-21 NOTE — Progress Notes (Signed)
Patient ID: Benjamin Davidson, male    DOB: 1951/01/15, 64 y.o.   MRN: [id number]  HPI Comments: 64 year old gentleman with a history of obesity, coronary artery disease, bypass surgery March 2008, poorly controlled diabetes, sedentary lifestyle,  history of DVT in the left leg,  on chronic warfarin who presents for routine followup of his coronary artery disease.   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 He had gangrene of the fifth toe on the right with acute renal failure and dehydration admitted 08/07/2011  In follow-up today, he reports that he has had problems with his eyes, recent shots now with blurriness in one eye Difficulty reading. Denies any chest pain concerning for angina Lab work reviewed with him showing hemoglobin A1c improved down to 7.3 His weight is down 4 pounds from his prior clinic visit, 257 pounds down to 248 pounds in the past year He has been skipping some insulin when sugar is running too low Still continues to eat some bad foods, pizza etc.  EKG on today's visit shows normal sinus rhythm with rate 68 bpm, left bundle branch block  Other past medical history last stress test July 2008 showing mild ischemia in the mid anteroseptal, apical anterior and apical septal region. Ejection fraction 40%. This test was done post bypass. No cardiac catheterization was done in followup as he had no symptoms.       Allergies  Allergen Reactions  . Lisinopril     Cough Flu like symptoms     Outpatient Encounter Prescriptions as of 12/21/2014  Medication Sig  . aspirin EC 325 MG EC tablet Take 325 mg by mouth daily.    Marland Kitchen BLACK CURRANT SEED OIL PO Take by mouth.  . carvedilol (COREG) 6.25 MG tablet TAKE 1 TABLET BY MOUTH TWICE A DAY  . DEVILS CLAW PO Take by mouth as needed.   . furosemide (LASIX) 20 MG tablet Take one tablet by mouth every day. If increased swelling, may take  one additional tablet daily as needed.  . gabapentin (NEURONTIN) 300 MG capsule Take 1 capsule (300 mg total) by mouth 2 (two) times daily.  . insulin NPH-insulin regular (NOVOLIN 70/30) (70-30) 100 UNIT/ML injection Inject 35 Units into the skin 2 (two) times daily with a meal.    . Insulin Pen Needle (NOVOFINE 31) 31G X 6 MM MISC by Does not apply route.    Marland Kitchen losartan-hydrochlorothiazide (HYZAAR) 100-12.5 MG tablet TAKE 1 TABLET BY MOUTH ONCE A DAY  . metFORMIN (GLUCOPHAGE) 500 MG tablet TAKE 1 TABLET BY MOUTH TWICE A DAY WITH A MEAL  . NOVOLIN R 100 UNIT/ML injection as needed.   . rosuvastatin (CRESTOR) 40 MG tablet Take 1 tablet (40 mg total) by mouth daily.  . sertraline (ZOLOFT) 100 MG tablet Take 100 mg by mouth daily.    Marland Kitchen warfarin (COUMADIN) 5 MG tablet Take as directed by anticoagulation clinic.   No facility-administered encounter medications on file as of 12/21/2014.    Past Medical History  Diagnosis Date  . Pure hypercholesterolemia   . MDD (recurrent major depressive disorder) in remission ([place])   . Left leg DVT ([place])   . Coronary artery disease   . DM (diabetes mellitus), type 2, uncontrolled, periph vascular complic ([place])     Type II  . Hypertension   . CHF (congestive heart failure) ([place])   . Peripheral vascular disease ([place])  Left toe amputations. Dr Lucky Cowboy    Past Surgical History  Procedure Laterality Date  . Coronary artery bypass graft    . Toe amputation      left 5th, right 4th and 5th  . Percutaneous stent intervention      right leg  . Peripherally inserted central catheter insertion  2013    Social History  reports that he has never smoked. He has never used smokeless tobacco. He reports that he does not drink alcohol or use illicit drugs.  Family History family history includes Hyperlipidemia in his brother.       Review of Systems  Constitutional: Negative.   Respiratory: Negative.   Cardiovascular: Negative.   Gastrointestinal:  Negative.   Musculoskeletal: Positive for gait problem.  Skin: Negative.   Neurological: Negative.   Hematological: Negative.   Psychiatric/Behavioral: Negative.   All other systems reviewed and are negative.   BP 100/62 mmHg  Pulse 68  Ht 5\' 10"  (1.778 m)  Wt 248 lb 12 oz (112.832 kg)  BMI 35.69 kg/m2  Physical Exam  Constitutional: He is oriented to person, place, and time. He appears well-developed and well-nourished.  Obese  HENT:  Head: Normocephalic.  Nose: Nose normal.  Mouth/Throat: Oropharynx is clear and moist.  Eyes: Conjunctivae are normal. Pupils are equal, round, and reactive to light.  Neck: Normal range of motion. Neck supple. No JVD present.  Cardiovascular: Normal rate, regular rhythm, S1 normal, S2 normal, normal heart sounds and intact distal pulses.  Exam reveals no gallop and no friction rub.   No murmur heard. Pulmonary/Chest: Effort normal and breath sounds normal. No respiratory distress. He has no wheezes. He has no rales. He exhibits no tenderness.  Abdominal: Soft. Bowel sounds are normal. He exhibits no distension. There is no tenderness.  Musculoskeletal: Normal range of motion. He exhibits no edema or tenderness.  Lymphadenopathy:    He has no cervical adenopathy.  Neurological: He is alert and oriented to person, place, and time. Coordination normal.  Skin: Skin is warm and dry. No rash noted. No erythema.  Psychiatric: He has a normal mood and affect. His behavior is normal. Judgment and thought content normal.      Assessment and Plan   Nursing note and vitals reviewed.

## 2014-12-21 NOTE — Assessment & Plan Note (Signed)
Most recent renal function is stable Stressed importance of continued aggressive diabetes control

## 2014-12-21 NOTE — Assessment & Plan Note (Signed)
Hemoglobin A1c significantly improved over the past several years Encouraged him to continue to follow a strict diet

## 2014-12-21 NOTE — Assessment & Plan Note (Signed)
Cholesterol is very low down to 97 Previous baseline in the 120s Recommended he decrease the Crestor down to 20 mg daily

## 2014-12-21 NOTE — Assessment & Plan Note (Signed)
Shortness of breath seems to have improved. He is taking Lasix daily

## 2014-12-21 NOTE — Assessment & Plan Note (Signed)
Blood pressure has been running low. Some symptoms of lightheadedness when standing recommended he hold the losartan HCTZ, take losartan 25 mg daily along Continue carvedilol

## 2014-12-21 NOTE — Patient Instructions (Addendum)
You are doing well.  Please cut the crestor in 1/2 daily  Please stop the losartan HCTZ Please start losartan 25 mg once a day  Please call us if you have new issues that need to be addressed before your next appt.  Your physician wants you to follow-up in: 6 months.  You will receive a reminder letter in the mail two months in advance. If you don't receive a letter, please call our office to schedule the follow-up appointment.

## 2014-12-21 NOTE — Assessment & Plan Note (Signed)
Currently with no symptoms of angina. No further workup at this time. Continue current medication regimen. 

## 2014-12-31 ENCOUNTER — Ambulatory Visit (INDEPENDENT_AMBULATORY_CARE_PROVIDER_SITE_OTHER): Payer: Medicare HMO | Admitting: *Deleted

## 2014-12-31 DIAGNOSIS — Z7901 Long term (current) use of anticoagulants: Secondary | ICD-10-CM | POA: Diagnosis not present

## 2014-12-31 DIAGNOSIS — Z86718 Personal history of other venous thrombosis and embolism: Secondary | ICD-10-CM | POA: Diagnosis not present

## 2014-12-31 DIAGNOSIS — Z5181 Encounter for therapeutic drug level monitoring: Secondary | ICD-10-CM | POA: Diagnosis not present

## 2014-12-31 DIAGNOSIS — I82409 Acute embolism and thrombosis of unspecified deep veins of unspecified lower extremity: Secondary | ICD-10-CM

## 2014-12-31 LAB — POCT INR: INR: 3.5

## 2014-12-31 NOTE — Progress Notes (Signed)
Pre visit review using our clinic review tool, if applicable. No additional management support is needed unless otherwise documented below in the visit note. 

## 2015-01-11 ENCOUNTER — Other Ambulatory Visit: Payer: Self-pay | Admitting: Internal Medicine

## 2015-01-11 DIAGNOSIS — E1159 Type 2 diabetes mellitus with other circulatory complications: Secondary | ICD-10-CM | POA: Diagnosis not present

## 2015-01-14 ENCOUNTER — Ambulatory Visit: Payer: Medicare HMO

## 2015-01-18 ENCOUNTER — Ambulatory Visit (INDEPENDENT_AMBULATORY_CARE_PROVIDER_SITE_OTHER): Payer: Medicare HMO | Admitting: *Deleted

## 2015-01-18 ENCOUNTER — Ambulatory Visit: Payer: Medicare HMO

## 2015-01-18 DIAGNOSIS — Z7901 Long term (current) use of anticoagulants: Secondary | ICD-10-CM

## 2015-01-18 DIAGNOSIS — Z5181 Encounter for therapeutic drug level monitoring: Secondary | ICD-10-CM

## 2015-01-18 DIAGNOSIS — Z86718 Personal history of other venous thrombosis and embolism: Secondary | ICD-10-CM

## 2015-01-18 DIAGNOSIS — I82409 Acute embolism and thrombosis of unspecified deep veins of unspecified lower extremity: Secondary | ICD-10-CM | POA: Diagnosis not present

## 2015-01-18 LAB — POCT INR: INR: 2.5

## 2015-01-18 NOTE — Progress Notes (Signed)
Pre visit review using our clinic review tool, if applicable. No additional management support is needed unless otherwise documented below in the visit note. 

## 2015-01-29 DIAGNOSIS — H4311 Vitreous hemorrhage, right eye: Secondary | ICD-10-CM | POA: Diagnosis not present

## 2015-01-29 DIAGNOSIS — E113511 Type 2 diabetes mellitus with proliferative diabetic retinopathy with macular edema, right eye: Secondary | ICD-10-CM | POA: Diagnosis not present

## 2015-02-02 ENCOUNTER — Other Ambulatory Visit: Payer: Self-pay | Admitting: Cardiovascular Disease

## 2015-02-02 ENCOUNTER — Other Ambulatory Visit: Payer: Self-pay | Admitting: Internal Medicine

## 2015-02-04 ENCOUNTER — Telehealth: Payer: Self-pay | Admitting: Internal Medicine

## 2015-02-04 ENCOUNTER — Telehealth: Payer: Self-pay | Admitting: Cardiovascular Disease

## 2015-02-04 NOTE — Telephone Encounter (Signed)
Benjamin Davidson @ [facility] called she stated she sent request for clarence to stop coumadin For 4 days She stated form was sent 02/02/15 Please advise Pt is having procedure 02/17/15

## 2015-02-04 NOTE — Telephone Encounter (Signed)
Dr. Silvio Pate is out of the office this week, this will need to be sent to his cardiologist.

## 2015-02-04 NOTE — Telephone Encounter (Signed)
Looks like [facility] sent clearance request to PCP, but Dr. Silvio Pate is out of the office and is deferring decision to you on holding coumadin.

## 2015-02-04 NOTE — Telephone Encounter (Signed)
Please fax Clearance letter to [phone] to stop Coumadin 4 days prior to retina surgery .  Surgery scheduled for 02/17/15.  Please call Alisia Ferrari op nurse [phone].

## 2015-02-07 NOTE — Telephone Encounter (Signed)
Ok to hold warfarin 4 days Would wear compression hose to prevent DVT

## 2015-02-08 NOTE — Telephone Encounter (Signed)
Clearance routed to [facility].

## 2015-02-11 ENCOUNTER — Other Ambulatory Visit: Payer: Self-pay | Admitting: Internal Medicine

## 2015-02-11 ENCOUNTER — Encounter: Payer: Self-pay | Admitting: *Deleted

## 2015-02-12 ENCOUNTER — Telehealth: Payer: Self-pay | Admitting: *Deleted

## 2015-02-12 NOTE — Telephone Encounter (Signed)
Patient called stating he was advised by eye doctor to discuss with you the need for an insulin pump.  He states the eye doctor has discussed this with you in the past.  I offered an ov to discuss but patient declined.  Please advise.

## 2015-02-12 NOTE — Telephone Encounter (Signed)
Spoke with patient and advised results   

## 2015-02-12 NOTE — Telephone Encounter (Signed)
I really don't agree. In a type 2 diabetic with good sugar control, I would not recommend a pump

## 2015-02-15 ENCOUNTER — Ambulatory Visit: Payer: Medicare HMO

## 2015-02-17 ENCOUNTER — Ambulatory Visit
Admission: RE | Admit: 2015-02-17 | Discharge: 2015-02-17 | Disposition: A | Discharge status: Home / Self Care | Payer: Medicare HMO | Source: Ambulatory Visit | Attending: Ophthalmology | Admitting: Ophthalmology

## 2015-02-17 ENCOUNTER — Encounter
Admission: RE | Disposition: A | Discharge status: Home / Self Care | Payer: Self-pay | Source: Ambulatory Visit | Attending: Ophthalmology

## 2015-02-17 ENCOUNTER — Ambulatory Visit: Payer: Medicare HMO | Admitting: Certified Registered Nurse Anesthetist

## 2015-02-17 ENCOUNTER — Encounter: Payer: Self-pay | Admitting: *Deleted

## 2015-02-17 DIAGNOSIS — Z888 Allergy status to other drugs, medicaments and biological substances status: Secondary | ICD-10-CM | POA: Diagnosis not present

## 2015-02-17 DIAGNOSIS — R0602 Shortness of breath: Secondary | ICD-10-CM | POA: Insufficient documentation

## 2015-02-17 DIAGNOSIS — F329 Major depressive disorder, single episode, unspecified: Secondary | ICD-10-CM | POA: Diagnosis not present

## 2015-02-17 DIAGNOSIS — M7989 Other specified soft tissue disorders: Secondary | ICD-10-CM | POA: Insufficient documentation

## 2015-02-17 DIAGNOSIS — E669 Obesity, unspecified: Secondary | ICD-10-CM | POA: Insufficient documentation

## 2015-02-17 DIAGNOSIS — Z89421 Acquired absence of other right toe(s): Secondary | ICD-10-CM | POA: Diagnosis not present

## 2015-02-17 DIAGNOSIS — I10 Essential (primary) hypertension: Secondary | ICD-10-CM | POA: Diagnosis not present

## 2015-02-17 DIAGNOSIS — Z6835 Body mass index (BMI) 35.0-35.9, adult: Secondary | ICD-10-CM | POA: Insufficient documentation

## 2015-02-17 DIAGNOSIS — I509 Heart failure, unspecified: Secondary | ICD-10-CM | POA: Diagnosis not present

## 2015-02-17 DIAGNOSIS — Z951 Presence of aortocoronary bypass graft: Secondary | ICD-10-CM | POA: Diagnosis not present

## 2015-02-17 DIAGNOSIS — H4311 Vitreous hemorrhage, right eye: Secondary | ICD-10-CM | POA: Insufficient documentation

## 2015-02-17 DIAGNOSIS — Z89422 Acquired absence of other left toe(s): Secondary | ICD-10-CM | POA: Insufficient documentation

## 2015-02-17 DIAGNOSIS — E7800 Pure hypercholesterolemia, unspecified: Secondary | ICD-10-CM | POA: Insufficient documentation

## 2015-02-17 DIAGNOSIS — D649 Anemia, unspecified: Secondary | ICD-10-CM | POA: Insufficient documentation

## 2015-02-17 DIAGNOSIS — I25810 Atherosclerosis of coronary artery bypass graft(s) without angina pectoris: Secondary | ICD-10-CM | POA: Diagnosis not present

## 2015-02-17 DIAGNOSIS — R69 Illness, unspecified: Secondary | ICD-10-CM | POA: Diagnosis not present

## 2015-02-17 DIAGNOSIS — I739 Peripheral vascular disease, unspecified: Secondary | ICD-10-CM | POA: Diagnosis not present

## 2015-02-17 DIAGNOSIS — E113591 Type 2 diabetes mellitus with proliferative diabetic retinopathy without macular edema, right eye: Secondary | ICD-10-CM | POA: Diagnosis not present

## 2015-02-17 HISTORY — DX: Anemia, unspecified: D64.9

## 2015-02-17 HISTORY — DX: Syncope and collapse: R55

## 2015-02-17 HISTORY — PX: PARS PLANA VITRECTOMY: SHX2166

## 2015-02-17 HISTORY — DX: Reserved for inherently not codable concepts without codable children: IMO0001

## 2015-02-17 HISTORY — DX: Edema, unspecified: R60.9

## 2015-02-17 HISTORY — DX: Unspecified asthma, uncomplicated: J45.909

## 2015-02-17 LAB — GLUCOSE, CAPILLARY
GLUCOSE-CAPILLARY: 87 mg/dL (ref 65–99)
Glucose-Capillary: 99 mg/dL (ref 65–99)

## 2015-02-17 SURGERY — PARS PLANA VITRECTOMY WITH 25 GAUGE
Anesthesia: Monitor Anesthesia Care | Site: Eye | Laterality: Right | Wound class: Clean

## 2015-02-17 MED ORDER — ALFENTANIL 500 MCG/ML IJ INJ
INJECTION | INTRAMUSCULAR | Status: DC | PRN
  Administered 2015-02-17: 500 ug via INTRAVENOUS
  Administered 2015-02-17 (×2): 250 ug via INTRAVENOUS

## 2015-02-17 MED ORDER — ATROPINE SULFATE 1 % OP SOLN
OPHTHALMIC | Status: AC
  Filled 2015-02-17: qty 5

## 2015-02-17 MED ORDER — MIDAZOLAM HCL 2 MG/2ML IJ SOLN
INTRAMUSCULAR | Status: DC | PRN
  Administered 2015-02-17: 2 mg via INTRAVENOUS

## 2015-02-17 MED ORDER — CYCLOPENTOLATE HCL 2 % OP SOLN
1.0000 [drp] | OPHTHALMIC | Status: AC | PRN
  Administered 2015-02-17 (×3): 1 [drp] via OPHTHALMIC

## 2015-02-17 MED ORDER — BSS PLUS IO SOLN
INTRAOCULAR | Status: DC | PRN
  Administered 2015-02-17: 1 via INTRAOCULAR

## 2015-02-17 MED ORDER — HYPROMELLOSE 0.3 % OP GEL
OPHTHALMIC | Status: AC
Start: 2015-02-17 — End: 2015-02-17
  Filled 2015-02-17: qty 3.5

## 2015-02-17 MED ORDER — CARVEDILOL 12.5 MG PO TABS
6.2500 mg | ORAL_TABLET | Freq: Once | ORAL | Status: AC
  Administered 2015-02-17: 6.25 mg via ORAL

## 2015-02-17 MED ORDER — TETRACAINE HCL 0.5 % OP SOLN
OPHTHALMIC | Status: AC
  Filled 2015-02-17: qty 2

## 2015-02-17 MED ORDER — LIDOCAINE HCL (PF) 4 % IJ SOLN
INTRAMUSCULAR | Status: AC
  Filled 2015-02-17: qty 5

## 2015-02-17 MED ORDER — DEXAMETHASONE SODIUM PHOSPHATE 10 MG/ML IJ SOLN
INTRAMUSCULAR | Status: AC
Start: 2015-02-17 — End: 2015-02-17
  Filled 2015-02-17: qty 1

## 2015-02-17 MED ORDER — BUPIVACAINE HCL (PF) 0.75 % IJ SOLN
INTRAMUSCULAR | Status: AC
  Filled 2015-02-17: qty 10

## 2015-02-17 MED ORDER — DEXAMETHASONE SODIUM PHOSPHATE 10 MG/ML IJ SOLN
INTRAMUSCULAR | Status: DC | PRN
  Administered 2015-02-17: 1 mL

## 2015-02-17 MED ORDER — PHENYLEPHRINE HCL 10 % OP SOLN
OPHTHALMIC | Status: AC
  Administered 2015-02-17: 1 [drp] via OPHTHALMIC
  Filled 2015-02-17: qty 5

## 2015-02-17 MED ORDER — PHENYLEPHRINE HCL 10 % OP SOLN
1.0000 [drp] | OPHTHALMIC | Status: AC | PRN
  Administered 2015-02-17 (×3): 1 [drp] via OPHTHALMIC

## 2015-02-17 MED ORDER — CARVEDILOL 12.5 MG PO TABS
ORAL_TABLET | ORAL | Status: AC
  Administered 2015-02-17: 6.25 mg via ORAL
  Filled 2015-02-17: qty 1

## 2015-02-17 MED ORDER — TETRACAINE HCL 0.5 % OP SOLN
OPHTHALMIC | Status: DC | PRN
  Administered 2015-02-17: 1 [drp] via OPHTHALMIC

## 2015-02-17 MED ORDER — CEFUROXIME OPHTHALMIC INJECTION 1 MG/0.1 ML
INJECTION | OPHTHALMIC | Status: DC | PRN
Start: 2015-02-17 — End: 2015-02-17
  Administered 2015-02-17: .1 mL via INTRACAMERAL

## 2015-02-17 MED ORDER — CYCLOPENTOLATE HCL 2 % OP SOLN
OPHTHALMIC | Status: AC
  Administered 2015-02-17: 1 [drp] via OPHTHALMIC
  Filled 2015-02-17: qty 2

## 2015-02-17 MED ORDER — HYPROMELLOSE 0.3 % OP GEL
OPHTHALMIC | Status: DC | PRN
  Administered 2015-02-17: 1 via OPHTHALMIC

## 2015-02-17 MED ORDER — ATROPINE SULFATE 1 % OP SOLN
OPHTHALMIC | Status: DC | PRN
  Administered 2015-02-17: 1 [drp] via OPHTHALMIC

## 2015-02-17 MED ORDER — SODIUM CHLORIDE 0.9 % IV SOLN
INTRAVENOUS | Status: DC
  Administered 2015-02-17: 07:00:00 via INTRAVENOUS

## 2015-02-17 MED ORDER — ONDANSETRON HCL 4 MG/2ML IJ SOLN
INTRAMUSCULAR | Status: DC | PRN
  Administered 2015-02-17: 4 mg via INTRAVENOUS

## 2015-02-17 MED ORDER — HYALURONIDASE HUMAN 150 UNIT/ML IJ SOLN
INTRAMUSCULAR | Status: AC
  Filled 2015-02-17: qty 1

## 2015-02-17 MED ORDER — NEOMYCIN-POLYMYXIN-DEXAMETH 3.5-10000-0.1 OP OINT
TOPICAL_OINTMENT | OPHTHALMIC | Status: AC
  Filled 2015-02-17: qty 3.5

## 2015-02-17 MED ORDER — NEOMYCIN-POLYMYXIN-DEXAMETH 0.1 % OP OINT
TOPICAL_OINTMENT | OPHTHALMIC | Status: DC | PRN
  Administered 2015-02-17: 1 via OPHTHALMIC

## 2015-02-17 MED ORDER — LIDOCAINE HCL (PF) 4 % IJ SOLN
INTRAMUSCULAR | Status: DC | PRN
  Administered 2015-02-17: 4 mL via OPHTHALMIC

## 2015-02-17 MED ORDER — DEXTROSE 50 % IV SOLN
INTRAVENOUS | Status: AC
  Filled 2015-02-17: qty 50

## 2015-02-17 MED ORDER — BSS PLUS IO SOLN
Freq: Once | INTRAOCULAR | Status: DC
  Filled 2015-02-17: qty 500

## 2015-02-17 SURGICAL SUPPLY — 29 items
APPLICATOR COTTON TIP 6IN STRL (MISCELLANEOUS) ×8 IMPLANT
CANNULA SOFT TIP 25G (CANNULA) ×1 IMPLANT
CORD BIP STRL DISP 12FT (MISCELLANEOUS) IMPLANT
CUP MEDICINE 2OZ PLAST GRAD ST (MISCELLANEOUS) ×2 IMPLANT
ERASER HMR WETFIELD 25G (MISCELLANEOUS) IMPLANT
FORCEPS GRIESH GRASP 25G (INSTRUMENTS) IMPLANT
FORCEPS GRIESH ILM PLUS 25G (INSTRUMENTS) IMPLANT
GLOVE BIO SURGEON STRL SZ8 (GLOVE) ×2 IMPLANT
GLOVE SURG LX 6.5 MICRO (GLOVE) ×1
GLOVE SURG LX STRL 6.5 MICRO (GLOVE) ×1 IMPLANT
GLOVE SURG PR MICRO ENCORE 7 (GLOVE) ×2 IMPLANT
GOWN STRL REUS W/ TWL LRG LVL3 (GOWN DISPOSABLE) ×2 IMPLANT
GOWN STRL REUS W/TWL LRG LVL3 (GOWN DISPOSABLE) ×4
LENS BIOM OPTIC SET 200MM DISP (MISCELLANEOUS) ×2 IMPLANT
LENS VITRECTOMY FLAT DISP (MISCELLANEOUS) IMPLANT
NDL FILTER BLUNT 18X1 1/2 (NEEDLE) ×1 IMPLANT
NDL RETROBULBAR .5 NSTRL (NEEDLE) ×2 IMPLANT
NEEDLE FILTER BLUNT 18X 1/2SAF (NEEDLE) ×1
NEEDLE FILTER BLUNT 18X1 1/2 (NEEDLE) ×1 IMPLANT
PACK EYE AFTER SURG (MISCELLANEOUS) ×2 IMPLANT
PACK VITRECTOMY (MISCELLANEOUS) ×2 IMPLANT
PACK VITRECTOMY CASSETTE 25GA (MISCELLANEOUS) ×2 IMPLANT
PROBE DIRECTIONAL LASER (MISCELLANEOUS) IMPLANT
PROBE LASER ILLUM FLEX CVD 25G (OPHTHALMIC) ×1 IMPLANT
SOL PREP PVP 2OZ (MISCELLANEOUS) ×2
SOLUTION PREP PVP 2OZ (MISCELLANEOUS) ×1 IMPLANT
STRAP SAFETY BODY (MISCELLANEOUS) ×2 IMPLANT
SUT VICRYL 7 0 TG140 8 (SUTURE) ×1 IMPLANT
SYRINGE 10CC LL (SYRINGE) ×2 IMPLANT

## 2015-02-17 NOTE — H&P (Signed)
.  Previous H&P scanned in reviewed, patient examined, and no interval changes.  Please see scanned record for complete information.   

## 2015-02-17 NOTE — Transfer of Care (Signed)
Immediate Anesthesia Transfer of Care Note  Patient: Benjamin Davidson  Procedure(s) Performed: Procedure(s) with comments: PARS PLANA VITRECTOMY WITH 25 GAUGE, endolaser, right eye (Right) - fluid pack lot # [id number] H endolaser: 200w, 1439p  Patient Location: PACU  Anesthesia Type:MAC  Level of Consciousness: awake, alert  and oriented  Airway & Oxygen Therapy: Patient Spontanous Breathing and Patient connected to nasal cannula oxygen  Post-op Assessment: Report given to RN and Post -op Vital signs reviewed and stable  Post vital signs: Reviewed and stable  Last Vitals:  Filed Vitals:   02/17/15 0603  BP: 164/71  Pulse: 73  Temp: 36.9 C  Resp: 18    Complications: No apparent anesthesia complications

## 2015-02-17 NOTE — Discharge Instructions (Signed)

## 2015-02-17 NOTE — Anesthesia Preprocedure Evaluation (Signed)
Anesthesia Evaluation  Patient identified by MRN, date of birth, ID band Patient awake    Reviewed: Allergy & Precautions, H&P , NPO status , Patient's Chart, lab work & pertinent test results, reviewed documented beta blocker date and time   History of Anesthesia Complications Negative for: history of anesthetic complications  Airway Mallampati: IV  TM Distance: >3 FB Neck ROM: full    Dental no notable dental hx. (+) Caps   Pulmonary neg shortness of breath, asthma , neg sleep apnea, neg COPD, neg recent URI,    Pulmonary exam normal breath sounds clear to auscultation       Cardiovascular Exercise Tolerance: Good hypertension, On Medications and On Home Beta Blockers (-) angina+ CAD, + Past MI, + CABG (2 vessels in 2007), + Peripheral Vascular Disease and +CHF  (-) Cardiac Stents Normal cardiovascular exam(-) dysrhythmias (-) Valvular Problems/Murmurs Rhythm:regular Rate:Normal     Neuro/Psych PSYCHIATRIC DISORDERS (Depression)  Neuromuscular disease (Peripheral neuropathy)    GI/Hepatic negative GI ROS, Neg liver ROS,   Endo/Other  diabetes, Insulin DependentMorbid obesity  Renal/GU CRFRenal disease  negative genitourinary   Musculoskeletal   Abdominal   Peds  Hematology negative hematology ROS (+)   Anesthesia Other Findings Past Medical History:   Pure hypercholesterolemia                                    MDD (recurrent major depressive disorder) in r*              Left leg DVT (HCC)                                           Coronary artery disease                                      DM (diabetes mellitus), type 2, uncontrolled, *                Comment:Type II   Hypertension                                                 CHF (congestive heart failure) ([place])                         Peripheral vascular disease (HCC)                              Comment:Left toe amputations. Dr Lucky Cowboy   Shortness of breath  dyspnea                                  Asthma                                                         Comment:childhood  Syncope                                                        Comment:resolved   Anemia                                                       Edema                                                          Comment:feet/legs   Reproductive/Obstetrics negative OB ROS                             Anesthesia Physical Anesthesia Plan  ASA: IV  Anesthesia Plan: MAC   Post-op Pain Management:    Induction:   Airway Management Planned:   Additional Equipment:   Intra-op Plan:   Post-operative Plan:   Informed Consent: I have reviewed the patients History and Physical, chart, labs and discussed the procedure including the risks, benefits and alternatives for the proposed anesthesia with the patient or authorized representative who has indicated his/her understanding and acceptance.   Dental Advisory Given  Plan Discussed with: Anesthesiologist, CRNA and Surgeon  Anesthesia Plan Comments:         Anesthesia Quick Evaluation

## 2015-02-17 NOTE — Anesthesia Postprocedure Evaluation (Signed)
Anesthesia Post Note  Patient: Benjamin Davidson  Procedure(s) Performed: Procedure(s) (LRB): PARS PLANA VITRECTOMY WITH 25 GAUGE, endolaser, right eye (Right)  Patient location during evaluation: PACU Anesthesia Type: General Level of consciousness: awake and alert Pain management: pain level controlled Vital Signs Assessment: post-procedure vital signs reviewed and stable Respiratory status: spontaneous breathing, nonlabored ventilation, respiratory function stable and patient connected to nasal cannula oxygen Cardiovascular status: blood pressure returned to baseline and stable Postop Assessment: no signs of nausea or vomiting Anesthetic complications: no    Last Vitals:  Filed Vitals:   02/17/15 0907 02/17/15 0920  BP: 156/71 149/69  Pulse: 61 58  Temp: 36.7 C   Resp: 16 16    Last Pain:  Filed Vitals:   02/17/15 0921  PainSc: 0-No pain                 Martha Clan

## 2015-02-17 NOTE — Anesthesia Procedure Notes (Signed)
Procedure Name: MAC Date/Time: 02/17/2015 7:43 AM Performed by: Johnna Acosta Pre-anesthesia Checklist: Patient identified, Emergency Drugs available, Suction available, Patient being monitored and Timeout performed Patient Re-evaluated:Patient Re-evaluated prior to inductionOxygen Delivery Method: Nasal cannula

## 2015-02-17 NOTE — Op Note (Signed)
PREOPERATIVE DIAGNOSIS: Vitreous hemorrhage, right eye, Proliferative diabetic retinopathy.            POST OPERATIVE DIAGNOSIS: Vitreous hemorrhage, right eye, Proliferative diabetic retinopathy. OPERATION PERFORMED: 25-gauge pars plana vitrectomy, endolaser, air-fluid exchange, partial air exchange, right eye.                    ANESTHESIA: MAC with retrobulbar block.   COMPLICATIONS: None.     BLOOD LOSS: Minimal.   SPECIMEN: None.   DESCRIPTION OF PROCEDURE: The patient was examined in the clinic for a nonclearing vitreous hemorrhage in the right eye. After a period of observation the decision was made and discussed with the patient to proceed with pars plana vitrectomy with laser. The risks, benefits, alternatives, and complications were discussed with the patient, and she elected to proceed with pars plana vitrectomy.    On the day of surgery, the patient was greeted in the preoperative holding area.  All questions were answered. The right eye was marked. The patient was then taken into the operating room in the supine position.   Next, 3 ml of a retrobulbar block consisting of 4% xylocaine plain, 0.75% sensorcaine plain and hylenex 100u was injected into the right orbit.The right eye was then prepped and draped in the usual sterile fashion.  Next, 25-gauge trocars were placed in the usual position. The infusion cannula was checked to make sure it was in the vitreous cavity despite the blood in the vitreous.  A core vitrectomy was performed.  The patient had a PVD, so core vitrectomy was then extended out to the periphery for 360 degrees. Small area of traction was isolated and all tractional components were removed with the cutter.Then three hundred and sixty degrees of pan retinal photocoagulation was performed. The periphery was carefully inspected for 360 degrees, there were no retinal tears or breaks noted. An air-fluid exchange was then performed.  The retinal surface was removed of all fluid.   The trocars were then removed, the superior two were sutured with 7.0 vicryl and all were then airtight.  Subconjunctival cefuroxime and dexamethasone were injected.  The eye was then patched and shielded with atropine and neo-poly-dex.  The patient was taken to the recovery area in stable condition.

## 2015-02-25 ENCOUNTER — Ambulatory Visit (INDEPENDENT_AMBULATORY_CARE_PROVIDER_SITE_OTHER): Payer: Medicare HMO | Admitting: *Deleted

## 2015-02-25 DIAGNOSIS — Z5181 Encounter for therapeutic drug level monitoring: Secondary | ICD-10-CM | POA: Diagnosis not present

## 2015-02-25 DIAGNOSIS — Z86718 Personal history of other venous thrombosis and embolism: Secondary | ICD-10-CM

## 2015-02-25 DIAGNOSIS — I82409 Acute embolism and thrombosis of unspecified deep veins of unspecified lower extremity: Secondary | ICD-10-CM

## 2015-02-25 DIAGNOSIS — Z7901 Long term (current) use of anticoagulants: Secondary | ICD-10-CM | POA: Diagnosis not present

## 2015-02-25 LAB — POCT INR: INR: 1.8

## 2015-02-25 NOTE — Progress Notes (Signed)
Pre visit review using our clinic review tool, if applicable. No additional management support is needed unless otherwise documented below in the visit note. 

## 2015-02-25 NOTE — Progress Notes (Signed)
Patient had eye surgery on 1/18.  Coumadin was held x4 days prior and resumed on the day of the procedure.  INR subtherapeutic today.  Patient instructed to take an extra half a tablet today to "boost" him, then continue on regular dosing until next check in 4 weeks.  Patient verbalized understanding.

## 2015-03-05 ENCOUNTER — Other Ambulatory Visit: Payer: Self-pay | Admitting: Internal Medicine

## 2015-03-13 ENCOUNTER — Other Ambulatory Visit: Payer: Self-pay | Admitting: Cardiovascular Disease

## 2015-03-13 ENCOUNTER — Other Ambulatory Visit: Payer: Self-pay | Admitting: Internal Medicine

## 2015-03-25 ENCOUNTER — Ambulatory Visit (INDEPENDENT_AMBULATORY_CARE_PROVIDER_SITE_OTHER): Payer: Medicare HMO | Admitting: *Deleted

## 2015-03-25 DIAGNOSIS — Z86718 Personal history of other venous thrombosis and embolism: Secondary | ICD-10-CM | POA: Diagnosis not present

## 2015-03-25 DIAGNOSIS — Z5181 Encounter for therapeutic drug level monitoring: Secondary | ICD-10-CM | POA: Diagnosis not present

## 2015-03-25 DIAGNOSIS — I82409 Acute embolism and thrombosis of unspecified deep veins of unspecified lower extremity: Secondary | ICD-10-CM | POA: Diagnosis not present

## 2015-03-25 DIAGNOSIS — Z7901 Long term (current) use of anticoagulants: Secondary | ICD-10-CM | POA: Diagnosis not present

## 2015-03-25 LAB — POCT INR: INR: 2.4

## 2015-03-25 NOTE — Progress Notes (Signed)
Pre visit review using our clinic review tool, if applicable. No additional management support is needed unless otherwise documented below in the visit note. 

## 2015-03-29 DIAGNOSIS — E113511 Type 2 diabetes mellitus with proliferative diabetic retinopathy with macular edema, right eye: Secondary | ICD-10-CM | POA: Diagnosis not present

## 2015-03-29 LAB — HM DIABETES EYE EXAM

## 2015-03-31 ENCOUNTER — Encounter: Payer: Self-pay | Admitting: Internal Medicine

## 2015-04-12 DIAGNOSIS — E1159 Type 2 diabetes mellitus with other circulatory complications: Secondary | ICD-10-CM | POA: Diagnosis not present

## 2015-04-22 ENCOUNTER — Ambulatory Visit (INDEPENDENT_AMBULATORY_CARE_PROVIDER_SITE_OTHER): Payer: Medicare HMO | Admitting: *Deleted

## 2015-04-22 DIAGNOSIS — I82409 Acute embolism and thrombosis of unspecified deep veins of unspecified lower extremity: Secondary | ICD-10-CM | POA: Diagnosis not present

## 2015-04-22 DIAGNOSIS — Z86718 Personal history of other venous thrombosis and embolism: Secondary | ICD-10-CM

## 2015-04-22 DIAGNOSIS — Z7901 Long term (current) use of anticoagulants: Secondary | ICD-10-CM

## 2015-04-22 DIAGNOSIS — Z5181 Encounter for therapeutic drug level monitoring: Secondary | ICD-10-CM | POA: Diagnosis not present

## 2015-04-22 LAB — POCT INR: INR: 2.7

## 2015-04-22 NOTE — Progress Notes (Signed)
Pre visit review using our clinic review tool, if applicable. No additional management support is needed unless otherwise documented below in the visit note. 

## 2015-05-06 DIAGNOSIS — H2513 Age-related nuclear cataract, bilateral: Secondary | ICD-10-CM | POA: Diagnosis not present

## 2015-05-10 DIAGNOSIS — E113511 Type 2 diabetes mellitus with proliferative diabetic retinopathy with macular edema, right eye: Secondary | ICD-10-CM | POA: Diagnosis not present

## 2015-05-20 ENCOUNTER — Ambulatory Visit (INDEPENDENT_AMBULATORY_CARE_PROVIDER_SITE_OTHER): Payer: Medicare HMO | Admitting: *Deleted

## 2015-05-20 DIAGNOSIS — I82409 Acute embolism and thrombosis of unspecified deep veins of unspecified lower extremity: Secondary | ICD-10-CM | POA: Diagnosis not present

## 2015-05-20 DIAGNOSIS — Z7901 Long term (current) use of anticoagulants: Secondary | ICD-10-CM

## 2015-05-20 DIAGNOSIS — Z5181 Encounter for therapeutic drug level monitoring: Secondary | ICD-10-CM | POA: Diagnosis not present

## 2015-05-20 DIAGNOSIS — Z86718 Personal history of other venous thrombosis and embolism: Secondary | ICD-10-CM | POA: Diagnosis not present

## 2015-05-20 LAB — POCT INR: INR: 2.3

## 2015-05-20 NOTE — Progress Notes (Signed)
Pre visit review using our clinic review tool, if applicable. No additional management support is needed unless otherwise documented below in the visit note. 

## 2015-05-26 DIAGNOSIS — H2513 Age-related nuclear cataract, bilateral: Secondary | ICD-10-CM | POA: Diagnosis not present

## 2015-06-01 ENCOUNTER — Encounter: Payer: Self-pay | Admitting: *Deleted

## 2015-06-03 ENCOUNTER — Encounter
Admission: RE | Disposition: A | Discharge status: Home / Self Care | Payer: Self-pay | Source: Ambulatory Visit | Attending: Ophthalmology

## 2015-06-03 ENCOUNTER — Ambulatory Visit: Payer: Medicare HMO | Admitting: Anesthesiology

## 2015-06-03 ENCOUNTER — Encounter: Payer: Self-pay | Admitting: Anesthesiology

## 2015-06-03 ENCOUNTER — Ambulatory Visit
Admission: RE | Admit: 2015-06-03 | Discharge: 2015-06-03 | Disposition: A | Discharge status: Home / Self Care | Payer: Medicare HMO | Source: Ambulatory Visit | Attending: Ophthalmology | Admitting: Ophthalmology

## 2015-06-03 DIAGNOSIS — E7800 Pure hypercholesterolemia, unspecified: Secondary | ICD-10-CM | POA: Diagnosis not present

## 2015-06-03 DIAGNOSIS — J45909 Unspecified asthma, uncomplicated: Secondary | ICD-10-CM | POA: Diagnosis not present

## 2015-06-03 DIAGNOSIS — F329 Major depressive disorder, single episode, unspecified: Secondary | ICD-10-CM | POA: Diagnosis not present

## 2015-06-03 DIAGNOSIS — I2510 Atherosclerotic heart disease of native coronary artery without angina pectoris: Secondary | ICD-10-CM | POA: Insufficient documentation

## 2015-06-03 DIAGNOSIS — I739 Peripheral vascular disease, unspecified: Secondary | ICD-10-CM | POA: Diagnosis not present

## 2015-06-03 DIAGNOSIS — H2513 Age-related nuclear cataract, bilateral: Secondary | ICD-10-CM | POA: Diagnosis not present

## 2015-06-03 DIAGNOSIS — I25810 Atherosclerosis of coronary artery bypass graft(s) without angina pectoris: Secondary | ICD-10-CM | POA: Diagnosis not present

## 2015-06-03 DIAGNOSIS — Z951 Presence of aortocoronary bypass graft: Secondary | ICD-10-CM | POA: Diagnosis not present

## 2015-06-03 DIAGNOSIS — N189 Chronic kidney disease, unspecified: Secondary | ICD-10-CM | POA: Insufficient documentation

## 2015-06-03 DIAGNOSIS — I509 Heart failure, unspecified: Secondary | ICD-10-CM | POA: Diagnosis not present

## 2015-06-03 DIAGNOSIS — E1122 Type 2 diabetes mellitus with diabetic chronic kidney disease: Secondary | ICD-10-CM | POA: Diagnosis not present

## 2015-06-03 DIAGNOSIS — H2512 Age-related nuclear cataract, left eye: Secondary | ICD-10-CM | POA: Insufficient documentation

## 2015-06-03 DIAGNOSIS — I129 Hypertensive chronic kidney disease with stage 1 through stage 4 chronic kidney disease, or unspecified chronic kidney disease: Secondary | ICD-10-CM | POA: Diagnosis not present

## 2015-06-03 DIAGNOSIS — Z86718 Personal history of other venous thrombosis and embolism: Secondary | ICD-10-CM | POA: Insufficient documentation

## 2015-06-03 DIAGNOSIS — R69 Illness, unspecified: Secondary | ICD-10-CM | POA: Diagnosis not present

## 2015-06-03 HISTORY — PX: CATARACT EXTRACTION W/PHACO: SHX586

## 2015-06-03 LAB — GLUCOSE, CAPILLARY: GLUCOSE-CAPILLARY: 122 mg/dL — AB (ref 65–99)

## 2015-06-03 SURGERY — PHACOEMULSIFICATION, CATARACT, WITH IOL INSERTION
Anesthesia: Monitor Anesthesia Care | Site: Eye | Laterality: Left | Wound class: Clean

## 2015-06-03 MED ORDER — ARMC OPHTHALMIC DILATING GEL
1.0000 "application " | OPHTHALMIC | Status: AC | PRN
  Administered 2015-06-03 (×2): 1 via OPHTHALMIC

## 2015-06-03 MED ORDER — MOXIFLOXACIN HCL 0.5 % OP SOLN
OPHTHALMIC | Status: AC
  Filled 2015-06-03: qty 3

## 2015-06-03 MED ORDER — SODIUM CHLORIDE 0.9 % IV SOLN
INTRAVENOUS | Status: DC
  Administered 2015-06-03: 07:00:00 via INTRAVENOUS

## 2015-06-03 MED ORDER — NA CHONDROIT SULF-NA HYALURON 40-17 MG/ML IO SOLN
INTRAOCULAR | Status: DC | PRN
  Administered 2015-06-03: 1 mL via INTRAOCULAR

## 2015-06-03 MED ORDER — CEFUROXIME OPHTHALMIC INJECTION 1 MG/0.1 ML
INJECTION | OPHTHALMIC | Status: AC
  Filled 2015-06-03: qty 0.1

## 2015-06-03 MED ORDER — POVIDONE-IODINE 5 % OP SOLN
1.0000 "application " | Freq: Once | OPHTHALMIC | Status: AC
  Administered 2015-06-03: 1 via OPHTHALMIC

## 2015-06-03 MED ORDER — POLYMYXIN B-TRIMETHOPRIM 10000-0.1 UNIT/ML-% OP SOLN
OPHTHALMIC | Status: DC | PRN
  Administered 2015-06-03: 1 [drp]

## 2015-06-03 MED ORDER — EPINEPHRINE HCL 1 MG/ML IJ SOLN
INTRAMUSCULAR | Status: AC
  Filled 2015-06-03: qty 1

## 2015-06-03 MED ORDER — TETRACAINE HCL 0.5 % OP SOLN
1.0000 [drp] | Freq: Once | OPHTHALMIC | Status: AC
  Administered 2015-06-03: 1 [drp] via OPHTHALMIC

## 2015-06-03 MED ORDER — ALFENTANIL 500 MCG/ML IJ INJ
INJECTION | INTRAMUSCULAR | Status: DC | PRN
  Administered 2015-06-03: 500 ug via INTRAVENOUS

## 2015-06-03 MED ORDER — LIDOCAINE HCL (PF) 4 % IJ SOLN
INTRAOCULAR | Status: DC | PRN
  Administered 2015-06-03: 4 mL via OPHTHALMIC

## 2015-06-03 MED ORDER — POVIDONE-IODINE 5 % OP SOLN
OPHTHALMIC | Status: AC
Start: 2015-06-03 — End: 2015-06-03
  Administered 2015-06-03: 1 via OPHTHALMIC
  Filled 2015-06-03: qty 30

## 2015-06-03 MED ORDER — CEFUROXIME OPHTHALMIC INJECTION 1 MG/0.1 ML
INJECTION | OPHTHALMIC | Status: DC | PRN
  Administered 2015-06-03: 0.1 mL via INTRACAMERAL

## 2015-06-03 MED ORDER — EPINEPHRINE HCL 1 MG/ML IJ SOLN
INTRAMUSCULAR | Status: DC | PRN
  Administered 2015-06-03: 200 mL via OPHTHALMIC

## 2015-06-03 MED ORDER — MIDAZOLAM HCL 5 MG/5ML IJ SOLN
INTRAMUSCULAR | Status: DC | PRN
  Administered 2015-06-03 (×2): 1 mg via INTRAVENOUS

## 2015-06-03 MED ORDER — MOXIFLOXACIN HCL 0.5 % OP SOLN
OPHTHALMIC | Status: DC | PRN
  Administered 2015-06-03: 1 [drp] via OPHTHALMIC

## 2015-06-03 MED ORDER — ARMC OPHTHALMIC DILATING GEL
OPHTHALMIC | Status: AC
  Administered 2015-06-03: 1 via OPHTHALMIC
  Filled 2015-06-03: qty 0.25

## 2015-06-03 MED ORDER — TETRACAINE HCL 0.5 % OP SOLN
OPHTHALMIC | Status: AC
  Administered 2015-06-03: 1 [drp] via OPHTHALMIC
  Filled 2015-06-03: qty 2

## 2015-06-03 MED ORDER — NA HYALUR & NA CHOND-NA HYALUR 0.55-0.5 ML IO KIT
PACK | INTRAOCULAR | Status: AC
  Filled 2015-06-03: qty 1.05

## 2015-06-03 MED ORDER — MOXIFLOXACIN HCL 0.5 % OP SOLN: 1.0000 [drp] | OPHTHALMIC | Status: DC | PRN

## 2015-06-03 SURGICAL SUPPLY — 23 items
CANNULA ANT/CHMB 27G (MISCELLANEOUS) ×1 IMPLANT
CANNULA ANT/CHMB 27GA (MISCELLANEOUS) ×2 IMPLANT
CUP MEDICINE 2OZ PLAST GRAD ST (MISCELLANEOUS) ×2 IMPLANT
GLOVE BIO SURGEON STRL SZ8 (GLOVE) ×3 IMPLANT
GLOVE BIOGEL M 6.5 STRL (GLOVE) ×2 IMPLANT
GLOVE SURG LX 7.5 STRW (GLOVE) ×1
GLOVE SURG LX STRL 7.5 STRW (GLOVE) ×1 IMPLANT
GOWN STRL REUS W/ TWL LRG LVL3 (GOWN DISPOSABLE) ×2 IMPLANT
GOWN STRL REUS W/TWL LRG LVL3 (GOWN DISPOSABLE) ×4
LENS IOL ACRSF IQ PC 20.5 (Intraocular Lens) IMPLANT
LENS IOL ACRYSOF IQ POST 20.5 (Intraocular Lens) ×2 IMPLANT
PACK CATARACT (MISCELLANEOUS) ×2 IMPLANT
PACK CATARACT BRASINGTON LX (MISCELLANEOUS) ×2 IMPLANT
PACK EYE AFTER SURG (MISCELLANEOUS) ×2 IMPLANT
SOL BSS BAG (MISCELLANEOUS) ×2
SOL PREP PVP 2OZ (MISCELLANEOUS) ×2
SOLUTION BSS BAG (MISCELLANEOUS) ×1 IMPLANT
SOLUTION PREP PVP 2OZ (MISCELLANEOUS) ×1 IMPLANT
SYR 3ML LL SCALE MARK (SYRINGE) ×2 IMPLANT
SYR 5ML LL (SYRINGE) ×2 IMPLANT
SYR TB 1ML 27GX1/2 LL (SYRINGE) ×2 IMPLANT
WATER STERILE IRR 1000ML POUR (IV SOLUTION) ×2 IMPLANT
WIPE NON LINTING 3.25X3.25 (MISCELLANEOUS) ×2 IMPLANT

## 2015-06-03 NOTE — Anesthesia Preprocedure Evaluation (Signed)
Anesthesia Evaluation  Patient identified by MRN, date of birth, ID band Patient awake    Reviewed: Allergy & Precautions, H&P , NPO status , Patient's Chart, lab work & pertinent test results, reviewed documented beta blocker date and time   History of Anesthesia Complications Negative for: history of anesthetic complications  Airway Mallampati: II  TM Distance: >3 FB Neck ROM: full    Dental no notable dental hx. (+) Caps, Missing, Poor Dentition   Pulmonary shortness of breath and with exertion, asthma , neg sleep apnea, neg COPD, neg recent URI,    Pulmonary exam normal breath sounds clear to auscultation       Cardiovascular Exercise Tolerance: Good hypertension, (-) angina+ CAD, + CABG, + Peripheral Vascular Disease and +CHF  (-) Past MI and (-) Cardiac Stents Normal cardiovascular exam(-) dysrhythmias (-) Valvular Problems/Murmurs Rhythm:regular Rate:Normal     Neuro/Psych PSYCHIATRIC DISORDERS (Depression) negative neurological ROS     GI/Hepatic negative GI ROS, Neg liver ROS,   Endo/Other  diabetes, Insulin Dependent  Renal/GU CRFRenal disease  negative genitourinary   Musculoskeletal   Abdominal   Peds  Hematology negative hematology ROS (+)   Anesthesia Other Findings Past Medical History:   Pure hypercholesterolemia                                    MDD (recurrent major depressive disorder) in r*              Left leg DVT (HCC)                                           Coronary artery disease                                      DM (diabetes mellitus), type 2, uncontrolled, *                Comment:Type II   Hypertension                                                 CHF (congestive heart failure) (HCC)                         Peripheral vascular disease (HCC)                              Comment:Left toe amputations. Dr Lucky Cowboy   Shortness of breath dyspnea                                  Syncope                                                         Comment:resolved   Anemia  Edema                                                          Comment:feet/legs   Asthma                                                         Comment:childhood   Reproductive/Obstetrics negative OB ROS                             Anesthesia Physical Anesthesia Plan  ASA: III  Anesthesia Plan: MAC   Post-op Pain Management:    Induction:   Airway Management Planned:   Additional Equipment:   Intra-op Plan:   Post-operative Plan:   Informed Consent: I have reviewed the patients History and Physical, chart, labs and discussed the procedure including the risks, benefits and alternatives for the proposed anesthesia with the patient or authorized representative who has indicated his/her understanding and acceptance.   Dental Advisory Given  Plan Discussed with: Anesthesiologist, CRNA and Surgeon  Anesthesia Plan Comments:         Anesthesia Quick Evaluation

## 2015-06-03 NOTE — Op Note (Signed)
OPERATIVE NOTE  ANGAS SHINDLEDECKER [id number] 06/03/2015   PREOPERATIVE DIAGNOSIS:  Nuclear sclerotic cataract left eye. H25.12   POSTOPERATIVE DIAGNOSIS:    Nuclear sclerotic cataract left eye.     PROCEDURE:  Phacoemusification with posterior chamber intraocular lens placement of the left eye   LENS:   Implant Name Type Inv. Item Serial No. Manufacturer Lot No. LRB No. Used  IMPLANT LENS - [id number] Intraocular Lens IMPLANT LENS [id number] ALCON   Left 1     20.5   ULTRASOUND TIME: 1.06 minutes, CDE 3.69   SURGEON:  Benay Pillow, MD   ANESTHESIA:  Topical with tetracaine drops and 2% Xylocaine jelly, augmented with 1% preservative-free intracameral lidocaine.    COMPLICATIONS:  None.   DESCRIPTION OF PROCEDURE:  The patient was identified in the holding room and transported to the operating room and placed in the supine position under the operating microscope.  The left eye was identified as the operative eye and it was prepped and draped in the usual sterile ophthalmic fashion.   A 1 millimeter clear-corneal paracentesis was made at the 5:00 position. 0.5 ml of preservative-free 1% lidocaine mixed with epinephrine was injected into the anterior chamber.  The anterior chamber was filled with Viscoat viscoelastic.  A 2.4 millimeter keratome was used to make a near-clear corneal incision at the 2:00 position.  .  A curvilinear capsulorrhexis was made with a cystotome and capsulorrhexis forceps.  Balanced salt solution was used to hydrodissect the nucleus.   Phacoemulsification was then used in stop and chop fashion to remove the lens nucleus and epinucleus.  The remaining cortex was then removed using the irrigation and aspiration handpiece. Provisc was then placed into the capsular bag to distend it for lens placement.  A lens was then injected into the capsular bag.  The remaining viscoelastic was aspirated.   Wounds were hydrated with balanced salt solution.  The anterior chamber  was inflated to a physiologic pressure with balanced salt solution.  Intracameral cefuroxime 0.1 mL 10mg /mL was injected into the anterior chamber.   No wound leaks were noted.  Topical Vigamox drops were applied to the eye.  The patient was taken to the recovery room in stable condition without complications of anesthesia or surgery  Benay Pillow 06/03/2015, 8:54 AM

## 2015-06-03 NOTE — Discharge Instructions (Signed)
Eye Surgery Discharge Instructions ° °Expect mild scratchy sensation or mild soreness. °DO NOT RUB YOUR EYE! ° °The day of surgery: °• Minimal physical activity, but bed rest is not required °• No reading, computer work, or close hand work °• No bending, lifting, or straining. °• May watch TV ° °For 24 hours: °• No driving, legal decisions, or alcoholic beverages °• Safety precautions °• Eat anything you prefer: It is better to start with liquids, then soup then solid foods. °• _____ Eye patch should be worn until postoperative exam tomorrow. °• ____ Solar shield eyeglasses should be worn for comfort in the sunlight/patch while sleeping ° °Resume all regular medications including aspirin or Coumadin if these were discontinued prior to surgery. °You may shower, bathe, shave, or wash your hair. °Tylenol may be taken for mild discomfort. ° °Call your doctor if you experience significant pain, nausea, or vomiting, fever > 101 or other signs of infection. [phone] or [phone] °Specific instructions: ° ° Eye Surgery Discharge Instructions ° °Expect mild scratchy sensation or mild soreness. °DO NOT RUB YOUR EYE! ° °The day of surgery: °• Minimal physical activity, but bed rest is not required °• No reading, computer work, or close hand work °• No bending, lifting, or straining. °• May watch TV ° °For 24 hours: °• No driving, legal decisions, or alcoholic beverages °• Safety precautions °• Eat anything you prefer: It is better to start with liquids, then soup then solid foods. °• _____ Eye patch should be worn until postoperative exam tomorrow. °• ____ Solar shield eyeglasses should be worn for comfort in the sunlight/patch while sleeping ° °Resume all regular medications including aspirin or Coumadin if these were discontinued prior to surgery. °You may shower, bathe, shave, or wash your hair. °Tylenol may be taken for mild discomfort. ° °Call your doctor if you experience significant pain, nausea, or vomiting, fever  > 101 or other signs of infection. [phone] or [phone] °Specific instructions: ° ° °

## 2015-06-03 NOTE — H&P (Signed)
The History and Physical notes are on paper, have been signed, and are to be scanned. The patient remains stable and unchanged from the H&P.   Previous H&P reviewed, patient examined, and there are no changes.  Benjamin Davidson 06/03/2015 8:04 AM

## 2015-06-03 NOTE — Transfer of Care (Signed)
Immediate Anesthesia Transfer of Care Note  Patient: Dickie Rebelo Westbrooks  Procedure(s) Performed: Procedure(s) with comments: CATARACT EXTRACTION PHACO AND INTRAOCULAR LENS PLACEMENT (IOC) (Left) - Lot# [id number] H [place]: 1.06 AP%: 5.5 CDE: 3.69  Patient Location: PACU  Anesthesia Type:MAC  Level of Consciousness: awake, alert  and oriented  Airway & Oxygen Therapy: Patient Spontanous Breathing and Patient connected to nasal cannula oxygen  Post-op Assessment: Report given to RN and Post -op Vital signs reviewed and stable  Post vital signs: Reviewed and stable  Last Vitals:  Filed Vitals:   06/03/15 0641  BP: 216/76  Pulse: 66  Temp: 36.6 C  Resp: 16    Last Pain: There were no vitals filed for this visit.       Complications: No apparent anesthesia complications

## 2015-06-07 NOTE — Anesthesia Postprocedure Evaluation (Signed)
Anesthesia Post Note  Patient: Ajith Gaby Buelna  Procedure(s) Performed: Procedure(s) (LRB): CATARACT EXTRACTION PHACO AND INTRAOCULAR LENS PLACEMENT (IOC) (Left)  Patient location during evaluation: PACU Anesthesia Type: General Level of consciousness: awake and alert Pain management: pain level controlled Vital Signs Assessment: post-procedure vital signs reviewed and stable Respiratory status: spontaneous breathing, nonlabored ventilation, respiratory function stable and patient connected to nasal cannula oxygen Cardiovascular status: blood pressure returned to baseline and stable Postop Assessment: no signs of nausea or vomiting Anesthetic complications: no    Last Vitals:  Filed Vitals:   06/03/15 0856 06/03/15 0900  BP: 183/76 184/84  Pulse: 65 60  Temp: 36.4 C   Resp: 16     Last Pain:  Filed Vitals:   06/04/15 0831  PainSc: 0-No pain                 Martha Clan

## 2015-06-11 ENCOUNTER — Other Ambulatory Visit: Payer: Self-pay | Admitting: Internal Medicine

## 2015-06-11 ENCOUNTER — Other Ambulatory Visit: Payer: Self-pay | Admitting: Cardiovascular Disease

## 2015-06-11 NOTE — Telephone Encounter (Signed)
Rx sent electronically.  

## 2015-06-14 ENCOUNTER — Ambulatory Visit (INDEPENDENT_AMBULATORY_CARE_PROVIDER_SITE_OTHER): Payer: Medicare HMO | Admitting: *Deleted

## 2015-06-14 DIAGNOSIS — Z5181 Encounter for therapeutic drug level monitoring: Secondary | ICD-10-CM

## 2015-06-14 DIAGNOSIS — Z7901 Long term (current) use of anticoagulants: Secondary | ICD-10-CM | POA: Diagnosis not present

## 2015-06-14 DIAGNOSIS — I82409 Acute embolism and thrombosis of unspecified deep veins of unspecified lower extremity: Secondary | ICD-10-CM

## 2015-06-14 DIAGNOSIS — Z86718 Personal history of other venous thrombosis and embolism: Secondary | ICD-10-CM

## 2015-06-14 LAB — POCT INR: INR: 2.2

## 2015-06-14 NOTE — Progress Notes (Signed)
Pre visit review using our clinic review tool, if applicable. No additional management support is needed unless otherwise documented below in the visit note. 

## 2015-06-17 ENCOUNTER — Ambulatory Visit (INDEPENDENT_AMBULATORY_CARE_PROVIDER_SITE_OTHER): Payer: Medicare HMO | Admitting: Internal Medicine

## 2015-06-17 ENCOUNTER — Encounter: Payer: Self-pay | Admitting: Internal Medicine

## 2015-06-17 VITALS — BP 142/90 | HR 70 | Temp 98.4°F | Wt 266.8 lb

## 2015-06-17 DIAGNOSIS — I82402 Acute embolism and thrombosis of unspecified deep veins of left lower extremity: Secondary | ICD-10-CM

## 2015-06-17 DIAGNOSIS — IMO0002 Reserved for concepts with insufficient information to code with codable children: Secondary | ICD-10-CM

## 2015-06-17 DIAGNOSIS — R29898 Other symptoms and signs involving the musculoskeletal system: Secondary | ICD-10-CM | POA: Insufficient documentation

## 2015-06-17 DIAGNOSIS — E1151 Type 2 diabetes mellitus with diabetic peripheral angiopathy without gangrene: Secondary | ICD-10-CM

## 2015-06-17 DIAGNOSIS — Z89429 Acquired absence of other toe(s), unspecified side: Secondary | ICD-10-CM

## 2015-06-17 DIAGNOSIS — M2669 Other specified disorders of temporomandibular joint: Secondary | ICD-10-CM

## 2015-06-17 DIAGNOSIS — E1165 Type 2 diabetes mellitus with hyperglycemia: Secondary | ICD-10-CM

## 2015-06-17 DIAGNOSIS — M25512 Pain in left shoulder: Secondary | ICD-10-CM

## 2015-06-17 DIAGNOSIS — F3340 Major depressive disorder, recurrent, in remission, unspecified: Secondary | ICD-10-CM

## 2015-06-17 NOTE — Assessment & Plan Note (Signed)
Sites look clean Adequate vascular supply

## 2015-06-17 NOTE — Progress Notes (Signed)
Subjective:    Patient ID: Benjamin Davidson, male    DOB: [birth year], 65 y.o.   MRN: [id number]  HPI Here for follow up of diabetes and other chronic health conditions  Having some problems with left shoulder arthritis Goes back for 2 months Doesn't remember any injury Pain worst if reaching up or behind him  occ arm soreness just sitting around---goes deep at times Uses acetaminophen arthritis-- some help but not all the time  Left ear popping occasionally Mostly if yawns or with eating No hearing problems or tinnitus  Occasionally checks sugars Pretty good lately but occasional low sugar feeling at 4AM or so (in 70's) 120's fasting is typical Feet sensation is off--- no sig pain  No chest pain No SOB at rest--still has DOE (realizes he is also out of shape) No dizziness or syncope Stable mild edema  Current Outpatient Prescriptions on File Prior to Visit  Medication Sig Dispense Refill  . aspirin EC 325 MG EC tablet Take 325 mg by mouth daily.      . carvedilol (COREG) 6.25 MG tablet TAKE 1 TABLET BY MOUTH 2 TIMES A DAY 180 tablet 3  . furosemide (LASIX) 20 MG tablet TAKE 1 TABLET BY MOUTH ONCE A DAY ** MAYTAKE ONE ADDITIONAL TABLET IFINCREASED SWELLING 60 tablet 3  . gabapentin (NEURONTIN) 300 MG capsule Take 1 capsule (300 mg total) by mouth 2 (two) times daily. 60 capsule 11  . Insulin Pen Needle (NOVOFINE 31) 31G X 6 MM MISC by Does not apply route.      Marland Kitchen KRILL OIL PO Take by mouth daily.    Marland Kitchen losartan (COZAAR) 25 MG tablet Take 1 tablet (25 mg total) by mouth daily. 90 tablet 3  . metFORMIN (GLUCOPHAGE) 500 MG tablet TAKE 1 TABLET BY MOUTH 2 TIMES DAILY WITH MEAL 60 tablet 0  . NOVOLIN 70/30 (70-30) 100 UNIT/ML injection INJECT 35 UNITS SUBCUTANEOUSLY TWICE A DAY AS DIRECTED 20 mL 3  . NOVOLIN R 100 UNIT/ML injection as needed.     . rosuvastatin (CRESTOR) 20 MG tablet Take 1 tablet (20 mg total) by mouth daily. 90 tablet 3  . sertraline (ZOLOFT) 100 MG tablet TAKE  1 TABLET BY MOUTH ONCE A DAY 90 tablet 3  . warfarin (COUMADIN) 5 MG tablet TAKE AS DIRECTED BY ANTICOAGULATION CLINIC 100 tablet 0   No current facility-administered medications on file prior to visit.    Allergies  Allergen Reactions  . Lisinopril     Cough Flu like symptoms     Past Medical History  Diagnosis Date  . Pure hypercholesterolemia   . MDD (recurrent major depressive disorder) in remission ([place])   . Left leg DVT ([place])   . Coronary artery disease   . DM (diabetes mellitus), type 2, uncontrolled, periph vascular complic ([place])     Type II  . Hypertension   . CHF (congestive heart failure) ([place])   . Peripheral vascular disease (HCC)     Left toe amputations. Dr Lucky Cowboy  . Shortness of breath dyspnea   . Syncope     resolved  . Anemia   . Edema     feet/legs  . Asthma     childhood    Past Surgical History  Procedure Laterality Date  . Coronary artery bypass graft    . Toe amputation      left 5th, right 4th and 5th  . Percutaneous stent intervention      right leg  . Peripherally  inserted central catheter insertion  2013  . Cardiac catheterization    . Pars plana vitrectomy Right 02/17/2015    Procedure: PARS PLANA VITRECTOMY WITH 25 GAUGE, endolaser, right eye;  Surgeon: Milus Height, MD;  Location: [facility] ORS;  Service: Ophthalmology;  Laterality: Right;  fluid pack lot # [id number] H endolaser: 200w, [id number]  . Eye surgery    . Cataract extraction w/phaco Left 06/03/2015    Procedure: CATARACT EXTRACTION PHACO AND INTRAOCULAR LENS PLACEMENT (IOC);  Surgeon: Eulogio Bear, MD;  Location: [facility] ORS;  Service: Ophthalmology;  Laterality: Left;  Lot# [id number] H [place]: 1.06 AP%: 5.5 CDE: 3.69    Family History  Problem Relation Age of Onset  . Hyperlipidemia Brother     Social History   Social History  . Marital Status: Divorced    Spouse Name: N/A  . Number of Children: 0  . Years of Education: N/A   Occupational History  . [name]     Disabled  since 2000's   Social History Main Topics  . Smoking status: Never Smoker   . Smokeless tobacco: Never Used  . Alcohol Use: No  . Drug Use: No  . Sexual Activity: Not on file   Other Topics Concern  . Not on file   Social History Narrative   No living will   Requests [name], sister, as health care POA.   Would accept resuscitation attempts   Not sure about tube feeds   Review of Systems Cataract just done in left eye--right being done soon. Appetite is fine Weight is up some  Sleeping okay    Objective:   Physical Exam  Constitutional: He appears well-developed. No distress.  HENT:  Popping of left TMJ TMs/canals normal  Neck: Normal range of motion. No thyromegaly present.  Cardiovascular: Normal rate, regular rhythm and normal heart sounds.  Exam reveals no gallop.   No murmur heard. Faint pedal pulses  Pulmonary/Chest: Effort normal and breath sounds normal. No respiratory distress. He has no wheezes. He has no rales.  Musculoskeletal:  Some decrease in external rotation of left shoulder--pain.  No swelling or bursa tenderness. Normal abduction  Lymphadenopathy:    He has no cervical adenopathy.  Neurological:  Decreased sensation feet  Skin:  Absent 4th and 5th toes on right, 5th on left. Both sites are clean and dry No ulcers  Psychiatric:  Somewhat flat but not really depressed          Assessment & Plan:

## 2015-06-17 NOTE — Assessment & Plan Note (Signed)
Not severe Discussed ice/heat prn acetaminophen

## 2015-06-17 NOTE — Assessment & Plan Note (Signed)
Explained it is not the ear Discussed ice if any pain He will try to get to the dentist for a check up

## 2015-06-17 NOTE — Assessment & Plan Note (Signed)
No current symptoms Just some scant swelling Still on the eliquis Sees vascular surgeon

## 2015-06-17 NOTE — Assessment & Plan Note (Signed)
Will continue the meds indefinitely

## 2015-06-17 NOTE — Assessment & Plan Note (Signed)
Seems to still have acceptable control Will check A1c

## 2015-06-17 NOTE — Progress Notes (Signed)
Pre visit review using our clinic review tool, if applicable. No additional management support is needed unless otherwise documented below in the visit note. 

## 2015-06-18 LAB — HEMOGLOBIN A1C: HEMOGLOBIN A1C: 7 % — AB (ref 4.6–6.5)

## 2015-06-21 ENCOUNTER — Other Ambulatory Visit: Payer: Self-pay | Admitting: Internal Medicine

## 2015-06-21 NOTE — Telephone Encounter (Signed)
This should be handled by Gina--but make sure he is refilled for a year

## 2015-06-23 ENCOUNTER — Telehealth: Payer: Self-pay | Admitting: Internal Medicine

## 2015-06-23 NOTE — Telephone Encounter (Signed)
Pt would like a call to discuss recent a1c levels. Please call.

## 2015-06-23 NOTE — Telephone Encounter (Signed)
Spoke to patient. He needed his A1C because he could not get in to [place].

## 2015-07-10 ENCOUNTER — Other Ambulatory Visit: Payer: Self-pay | Admitting: Internal Medicine

## 2015-07-12 DIAGNOSIS — E1159 Type 2 diabetes mellitus with other circulatory complications: Secondary | ICD-10-CM | POA: Diagnosis not present

## 2015-07-26 ENCOUNTER — Other Ambulatory Visit (INDEPENDENT_AMBULATORY_CARE_PROVIDER_SITE_OTHER): Payer: Medicare HMO

## 2015-07-26 DIAGNOSIS — Z7901 Long term (current) use of anticoagulants: Secondary | ICD-10-CM

## 2015-07-26 DIAGNOSIS — I82409 Acute embolism and thrombosis of unspecified deep veins of unspecified lower extremity: Secondary | ICD-10-CM

## 2015-07-26 LAB — POCT INR: INR: 3.7

## 2015-08-04 ENCOUNTER — Other Ambulatory Visit: Payer: Self-pay | Admitting: Internal Medicine

## 2015-08-06 ENCOUNTER — Telehealth: Payer: Self-pay

## 2015-08-06 NOTE — Telephone Encounter (Signed)
Patient is on the list for [facility] 2017 and may be a good candidate for an AWV in 2017. Please let me know if/when appt is scheduled.   

## 2015-08-09 ENCOUNTER — Other Ambulatory Visit (INDEPENDENT_AMBULATORY_CARE_PROVIDER_SITE_OTHER): Payer: Medicare HMO

## 2015-08-09 DIAGNOSIS — I82409 Acute embolism and thrombosis of unspecified deep veins of unspecified lower extremity: Secondary | ICD-10-CM | POA: Diagnosis not present

## 2015-08-09 DIAGNOSIS — Z7901 Long term (current) use of anticoagulants: Secondary | ICD-10-CM

## 2015-08-09 LAB — POCT INR: INR: 2.4

## 2015-08-10 ENCOUNTER — Other Ambulatory Visit: Payer: Self-pay | Admitting: Internal Medicine

## 2015-08-23 DIAGNOSIS — E119 Type 2 diabetes mellitus without complications: Secondary | ICD-10-CM | POA: Diagnosis not present

## 2015-08-23 DIAGNOSIS — M79609 Pain in unspecified limb: Secondary | ICD-10-CM | POA: Diagnosis not present

## 2015-08-23 DIAGNOSIS — I70213 Atherosclerosis of native arteries of extremities with intermittent claudication, bilateral legs: Secondary | ICD-10-CM | POA: Diagnosis not present

## 2015-08-23 DIAGNOSIS — I739 Peripheral vascular disease, unspecified: Secondary | ICD-10-CM | POA: Diagnosis not present

## 2015-08-23 DIAGNOSIS — I10 Essential (primary) hypertension: Secondary | ICD-10-CM | POA: Diagnosis not present

## 2015-08-23 DIAGNOSIS — I70211 Atherosclerosis of native arteries of extremities with intermittent claudication, right leg: Secondary | ICD-10-CM | POA: Diagnosis not present

## 2015-08-23 DIAGNOSIS — E785 Hyperlipidemia, unspecified: Secondary | ICD-10-CM | POA: Diagnosis not present

## 2015-08-31 DIAGNOSIS — E113592 Type 2 diabetes mellitus with proliferative diabetic retinopathy without macular edema, left eye: Secondary | ICD-10-CM | POA: Diagnosis not present

## 2015-08-31 LAB — HM DIABETES EYE EXAM

## 2015-09-02 ENCOUNTER — Other Ambulatory Visit: Payer: Self-pay

## 2015-09-02 MED ORDER — "INSULIN SYRINGE-NEEDLE U-100 31G X 5/16"" 0.5 ML MISC": 1.0000 | Freq: Two times a day (BID) | 7 refills | Status: DC

## 2015-09-02 NOTE — Telephone Encounter (Signed)
Rx sent electronically.  

## 2015-09-09 ENCOUNTER — Other Ambulatory Visit: Payer: Medicare HMO

## 2015-09-09 ENCOUNTER — Encounter: Payer: Self-pay | Admitting: Internal Medicine

## 2015-09-16 ENCOUNTER — Other Ambulatory Visit: Payer: Medicare HMO

## 2015-09-16 ENCOUNTER — Other Ambulatory Visit (INDEPENDENT_AMBULATORY_CARE_PROVIDER_SITE_OTHER): Payer: Medicare HMO

## 2015-09-16 DIAGNOSIS — Z7901 Long term (current) use of anticoagulants: Secondary | ICD-10-CM | POA: Diagnosis not present

## 2015-09-16 DIAGNOSIS — I82409 Acute embolism and thrombosis of unspecified deep veins of unspecified lower extremity: Secondary | ICD-10-CM

## 2015-09-16 LAB — POCT INR: INR: 2.4

## 2015-10-19 DIAGNOSIS — E1159 Type 2 diabetes mellitus with other circulatory complications: Secondary | ICD-10-CM | POA: Diagnosis not present

## 2015-10-21 ENCOUNTER — Other Ambulatory Visit (INDEPENDENT_AMBULATORY_CARE_PROVIDER_SITE_OTHER): Payer: Medicare HMO

## 2015-10-21 DIAGNOSIS — I82409 Acute embolism and thrombosis of unspecified deep veins of unspecified lower extremity: Secondary | ICD-10-CM | POA: Diagnosis not present

## 2015-10-21 DIAGNOSIS — Z7901 Long term (current) use of anticoagulants: Secondary | ICD-10-CM

## 2015-10-21 LAB — POCT INR: INR: 2.6

## 2015-11-01 DIAGNOSIS — R109 Unspecified abdominal pain: Secondary | ICD-10-CM | POA: Diagnosis not present

## 2015-11-01 DIAGNOSIS — Z6828 Body mass index (BMI) 28.0-28.9, adult: Secondary | ICD-10-CM | POA: Diagnosis not present

## 2015-11-01 DIAGNOSIS — R69 Illness, unspecified: Secondary | ICD-10-CM | POA: Diagnosis not present

## 2015-11-02 ENCOUNTER — Encounter: Payer: Self-pay | Admitting: Cardiovascular Disease

## 2015-11-02 ENCOUNTER — Ambulatory Visit (INDEPENDENT_AMBULATORY_CARE_PROVIDER_SITE_OTHER): Payer: Medicare HMO | Admitting: Cardiovascular Disease

## 2015-11-02 VITALS — BP 120/64 | HR 69 | Wt 260.5 lb

## 2015-11-02 DIAGNOSIS — I739 Peripheral vascular disease, unspecified: Secondary | ICD-10-CM

## 2015-11-02 DIAGNOSIS — E1151 Type 2 diabetes mellitus with diabetic peripheral angiopathy without gangrene: Secondary | ICD-10-CM | POA: Diagnosis not present

## 2015-11-02 DIAGNOSIS — IMO0002 Reserved for concepts with insufficient information to code with codable children: Secondary | ICD-10-CM

## 2015-11-02 DIAGNOSIS — I10 Essential (primary) hypertension: Secondary | ICD-10-CM | POA: Diagnosis not present

## 2015-11-02 DIAGNOSIS — E1165 Type 2 diabetes mellitus with hyperglycemia: Secondary | ICD-10-CM

## 2015-11-02 DIAGNOSIS — I25810 Atherosclerosis of coronary artery bypass graft(s) without angina pectoris: Secondary | ICD-10-CM | POA: Diagnosis not present

## 2015-11-02 DIAGNOSIS — E7800 Pure hypercholesterolemia, unspecified: Secondary | ICD-10-CM

## 2015-11-02 DIAGNOSIS — R0602 Shortness of breath: Secondary | ICD-10-CM

## 2015-11-02 NOTE — Progress Notes (Signed)
Cardiology Office Note  Date:  11/02/2015   ID:  Benjamin Davidson, DOB 06/07/1950, MRN [id number]  PCP:  Viviana Simpler, MD   Chief Complaint  Patient presents with  . other    C/o chest pain and blood sugar 105 today. Meds reviewed verbally with pt.    HPI:  65 year old gentleman with a history of obesity, coronary artery disease, bypass surgery March 2008, poorly controlled diabetes, sedentary lifestyle,  history of DVT in the left leg,  on chronic warfarin who presents for routine followup of his coronary artery disease.   In follow-up he reports that he feels well Trying to watch his diet Hemoglobin A1c slowly trending downwards, no street recently 7.0 Other lab work reviewed, toal chol <100 No regular exercise program  Long conversation concerning his SOB with exertion, No exercise, still obese Takes lasix daily Left > right edema of ankles Uses a cane for balance, worse after toe amputation Long discussion concerning his diabetic diet  Brother died several months ago  EKG on today's visit shows normal sinus rhythm with rate 69 bpm, left bundle branch block  Other past medical history reviewed History of osteomyelitis requiring wound VAC on his right foot, 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 He had gangrene of the fifth toe on the right with acute renal failure and dehydration admitted 08/07/2011  last stress test July 2008 showing mild ischemia in the mid anteroseptal, apical anterior and apical septal region. Ejection fraction 40%. This test was done post bypass. No cardiac catheterization was done in followup as he had no symptoms.  PMH:   has a past medical history of Anemia; Asthma; CHF (congestive heart failure) ([place]); Coronary artery disease; DM (diabetes mellitus), type 2, uncontrolled, periph vascular complic ([place]); Edema; Hypertension; Left leg DVT ([place]); MDD (recurrent major depressive disorder) in remission ([place]); Peripheral  vascular disease ([place]); Pure hypercholesterolemia; Shortness of breath dyspnea; and Syncope.  PSH:    Past Surgical History:  Procedure Laterality Date  . CARDIAC CATHETERIZATION    . CATARACT EXTRACTION W/PHACO Left 06/03/2015   Procedure: CATARACT EXTRACTION PHACO AND INTRAOCULAR LENS PLACEMENT (IOC);  Surgeon: Eulogio Bear, MD;  Location: [facility] ORS;  Service: Ophthalmology;  Laterality: Left;  Lot# [id number] H [place]: 1.06 AP%: 5.5 CDE: 3.69  . CORONARY ARTERY BYPASS GRAFT    . EYE SURGERY    . PARS PLANA VITRECTOMY Right 02/17/2015   Procedure: PARS PLANA VITRECTOMY WITH 25 GAUGE, endolaser, right eye;  Surgeon: Milus Height, MD;  Location: [facility] ORS;  Service: Ophthalmology;  Laterality: Right;  fluid pack lot # [id number] H endolaser: 200w, [id number]  . PERCUTANEOUS STENT INTERVENTION     right leg  . PERIPHERALLY INSERTED CENTRAL CATHETER INSERTION  2013  . TOE AMPUTATION     left 5th, right 4th and 5th    Current Outpatient Prescriptions  Medication Sig Dispense Refill  . aspirin EC 325 MG EC tablet Take 325 mg by mouth daily.      . carvedilol (COREG) 6.25 MG tablet TAKE 1 TABLET BY MOUTH 2 TIMES A DAY 180 tablet 3  . furosemide (LASIX) 20 MG tablet TAKE 1 TABLET BY MOUTH ONCE A DAY ** MAYTAKE ONE ADDITIONAL TABLET IFINCREASED SWELLING 60 tablet 3  . gabapentin (NEURONTIN) 300 MG capsule Take 1 capsule (300 mg total) by mouth 2 (two) times daily. 60 capsule 11  . Insulin Pen Needle (NOVOFINE 31) 31G X 6 MM MISC by Does not apply route.      Marland Kitchen  Insulin Syringe-Needle U-100 (PRODIGY INSULIN SYRINGE) 31G X 5/16" 0.5 ML MISC Inject 1 Syringe into the skin 2 (two) times daily. 100 each 7  . losartan (COZAAR) 25 MG tablet Take 1 tablet (25 mg total) by mouth daily. 90 tablet 3  . metFORMIN (GLUCOPHAGE) 500 MG tablet TAKE 1 TABLET BY MOUTH 2 TIMES DAILY WITH MEAL 60 tablet 11  . NOVOLIN 70/30 (70-30) 100 UNIT/ML injection INJECT 35 UNITS SUBCUTANEOUSLY TWICE A DAY AS DIRECTED 20 mL 5  .  NOVOLIN R 100 UNIT/ML injection as needed.     . rosuvastatin (CRESTOR) 20 MG tablet Take 1 tablet (20 mg total) by mouth daily. 90 tablet 3  . sertraline (ZOLOFT) 100 MG tablet TAKE 1 TABLET BY MOUTH ONCE A DAY 90 tablet 3  . warfarin (COUMADIN) 5 MG tablet TAKE AS DIRECTED BY ANTICOAGULATION CLINIC 100 tablet 0   No current facility-administered medications for this visit.      Allergies:   Lisinopril   Social History:  The patient  reports that he has never smoked. He has never used smokeless tobacco. He reports that he does not drink alcohol or use drugs.   Family History:   family history includes Hyperlipidemia in his brother.    Review of Systems: Review of Systems  Constitutional: Negative.   Respiratory: Negative.   Cardiovascular: Negative.   Gastrointestinal: Negative.   Musculoskeletal: Negative.   Neurological: Negative.   Psychiatric/Behavioral: Negative.   All other systems reviewed and are negative.    PHYSICAL EXAM: VS:  BP 120/64 (BP Location: Left Arm, Patient Position: Sitting, Cuff Size: Large)   Pulse 69   Wt 260 lb 8 oz (118.2 kg)   BMI 37.38 kg/m  , BMI Body mass index is 37.38 kg/m. GEN: Well nourished, well developed, in no acute distress, obese  HEENT: normal  Neck: no JVD, carotid bruits, or masses Cardiac: RRR; no murmurs, rubs, or gallops,trace ankle edema  Respiratory:  clear to auscultation bilaterally, normal work of breathing GI: soft, nontender, nondistended, + BS MS: no deformity or atrophy  Skin: warm and dry, no rash Neuro:  Strength and sensation are intact Psych: euthymic mood, full affect    Recent Labs: 12/17/2014: ALT 25; BUN 29; Creatinine, Ser 1.36; Hemoglobin 12.2; Platelets 288.0; Potassium 3.9; Sodium 140    Lipid Panel Lab Results  Component Value Date   CHOL 97 12/17/2014   HDL 37.60 (L) 12/17/2014   LDLCALC 40 12/17/2014   TRIG 97.0 12/17/2014      Wt Readings from Last 3 Encounters:  11/02/15 260 lb 8  oz (118.2 kg)  06/17/15 266 lb 12 oz (121 kg)  06/01/15 260 lb (117.9 kg)       ASSESSMENT AND PLAN:  Essential hypertension - Plan: EKG 12-Lead Blood pressure is well controlled on today's visit. No changes made to the medications.  Atherosclerosis of coronary artery bypass graft of native heart without angina pectoris - Plan: EKG 12-Lead Currently with no symptoms of angina. No further workup at this time. Continue current medication regimen.  DM (diabetes mellitus), type 2, uncontrolled, periph vascular complic (HCC) Improved 123456, recommended further weight loss  Pure hypercholesterolemia Cholesterol is at goal on the current lipid regimen. No changes to the medications were made.  DYSPNEA Shortness of breath likely multifactorial including deconditioning, obesity Unable to exclude chronic diastolic CHF Recommended he take extra Lasix as needed for ankle swelling, start exercise program  Obesity Recommended continued strict diet, Suggested he start exercise program  Total encounter time more than 25 minutes  Greater than 50% was spent in counseling and coordination of care with the patient  Disposition:   F/U  12 months   Orders Placed This Encounter  Procedures  . EKG 12-Lead     Signed, Esmond Plants, M.D., Ph.D. 11/02/2015  [place], [place]

## 2015-11-02 NOTE — Patient Instructions (Signed)

## 2015-11-09 ENCOUNTER — Other Ambulatory Visit: Payer: Self-pay | Admitting: *Deleted

## 2015-11-09 MED ORDER — LOSARTAN POTASSIUM 25 MG PO TABS: 25.0000 mg | ORAL_TABLET | Freq: Every day | ORAL | 3 refills | Status: DC

## 2015-11-22 ENCOUNTER — Other Ambulatory Visit (INDEPENDENT_AMBULATORY_CARE_PROVIDER_SITE_OTHER): Payer: Medicare HMO

## 2015-11-22 DIAGNOSIS — Z7901 Long term (current) use of anticoagulants: Secondary | ICD-10-CM

## 2015-11-22 DIAGNOSIS — I82409 Acute embolism and thrombosis of unspecified deep veins of unspecified lower extremity: Secondary | ICD-10-CM | POA: Diagnosis not present

## 2015-11-22 LAB — POCT INR: INR: 2.7

## 2015-12-07 ENCOUNTER — Other Ambulatory Visit: Payer: Self-pay | Admitting: *Deleted

## 2015-12-07 MED ORDER — ROSUVASTATIN CALCIUM 20 MG PO TABS: 20.0000 mg | ORAL_TABLET | Freq: Every day | ORAL | 3 refills | Status: DC

## 2015-12-20 ENCOUNTER — Encounter: Payer: Self-pay | Admitting: Internal Medicine

## 2015-12-20 ENCOUNTER — Ambulatory Visit (INDEPENDENT_AMBULATORY_CARE_PROVIDER_SITE_OTHER): Payer: Medicare HMO | Admitting: Internal Medicine

## 2015-12-20 VITALS — BP 128/80 | HR 79 | Temp 98.0°F | Wt 260.0 lb

## 2015-12-20 DIAGNOSIS — I739 Peripheral vascular disease, unspecified: Secondary | ICD-10-CM | POA: Diagnosis not present

## 2015-12-20 DIAGNOSIS — N183 Chronic kidney disease, stage 3 unspecified: Secondary | ICD-10-CM

## 2015-12-20 DIAGNOSIS — Z7901 Long term (current) use of anticoagulants: Secondary | ICD-10-CM

## 2015-12-20 DIAGNOSIS — Z89429 Acquired absence of other toe(s), unspecified side: Secondary | ICD-10-CM

## 2015-12-20 DIAGNOSIS — E1122 Type 2 diabetes mellitus with diabetic chronic kidney disease: Secondary | ICD-10-CM

## 2015-12-20 DIAGNOSIS — I82409 Acute embolism and thrombosis of unspecified deep veins of unspecified lower extremity: Secondary | ICD-10-CM | POA: Diagnosis not present

## 2015-12-20 DIAGNOSIS — E1142 Type 2 diabetes mellitus with diabetic polyneuropathy: Secondary | ICD-10-CM

## 2015-12-20 DIAGNOSIS — I82502 Chronic embolism and thrombosis of unspecified deep veins of left lower extremity: Secondary | ICD-10-CM

## 2015-12-20 DIAGNOSIS — S98139A Complete traumatic amputation of one unspecified lesser toe, initial encounter: Secondary | ICD-10-CM

## 2015-12-20 DIAGNOSIS — F3340 Major depressive disorder, recurrent, in remission, unspecified: Secondary | ICD-10-CM

## 2015-12-20 LAB — POCT INR: INR: 2.3

## 2015-12-20 LAB — HM DIABETES FOOT EXAM

## 2015-12-20 NOTE — Progress Notes (Signed)
Subjective:    Patient ID: Benjamin Davidson, male    DOB: 1950-09-20, 65 y.o.   MRN: [id number]  HPI Here for follow up of diabetes and other chronic health conditions  No new concerns Checks sugars bid Regular only if running high Occasionally under 100 but no clinical hypoglycemia No foot pain or sores--sometimes gets tight feeling No claudication--but doesn't really exercise  Has some left shoulder pain--- sharp and in back Comes around to chest Started last night Feels like his past bursitis  No SOB No palpitations No dizziness or syncope Mild edema-- mostly gone by AM  Mood has been good Continues on the sertraline  Current Outpatient Prescriptions on File Prior to Visit  Medication Sig Dispense Refill  . aspirin EC 325 MG EC tablet Take 325 mg by mouth daily.      . carvedilol (COREG) 6.25 MG tablet TAKE 1 TABLET BY MOUTH 2 TIMES A DAY 180 tablet 3  . furosemide (LASIX) 20 MG tablet TAKE 1 TABLET BY MOUTH ONCE A DAY ** MAYTAKE ONE ADDITIONAL TABLET IFINCREASED SWELLING 60 tablet 3  . gabapentin (NEURONTIN) 300 MG capsule Take 1 capsule (300 mg total) by mouth 2 (two) times daily. 60 capsule 11  . Insulin Pen Needle (NOVOFINE 31) 31G X 6 MM MISC by Does not apply route.      . Insulin Syringe-Needle U-100 (PRODIGY INSULIN SYRINGE) 31G X 5/16" 0.5 ML MISC Inject 1 Syringe into the skin 2 (two) times daily. 100 each 7  . losartan (COZAAR) 25 MG tablet Take 1 tablet (25 mg total) by mouth daily. 90 tablet 3  . metFORMIN (GLUCOPHAGE) 500 MG tablet TAKE 1 TABLET BY MOUTH 2 TIMES DAILY WITH MEAL 60 tablet 11  . NOVOLIN 70/30 (70-30) 100 UNIT/ML injection INJECT 35 UNITS SUBCUTANEOUSLY TWICE A DAY AS DIRECTED 20 mL 5  . NOVOLIN R 100 UNIT/ML injection as needed.     . rosuvastatin (CRESTOR) 20 MG tablet Take 1 tablet (20 mg total) by mouth daily. 90 tablet 3  . sertraline (ZOLOFT) 100 MG tablet TAKE 1 TABLET BY MOUTH ONCE A DAY 90 tablet 3  . warfarin (COUMADIN) 5 MG tablet  TAKE AS DIRECTED BY ANTICOAGULATION CLINIC 100 tablet 0   No current facility-administered medications on file prior to visit.     Allergies  Allergen Reactions  . Lisinopril     Cough Flu like symptoms     Past Medical History:  Diagnosis Date  . Anemia   . Asthma    childhood  . CHF (congestive heart failure) ([place])   . Coronary artery disease   . DM (diabetes mellitus), type 2, uncontrolled, periph vascular complic ([place])    Type II  . Edema    feet/legs  . Hypertension   . Left leg DVT ([place])   . MDD (recurrent major depressive disorder) in remission ([place])   . Peripheral vascular disease ([facility])    Left toe amputations. Dr Lucky Cowboy  . Pure hypercholesterolemia   . Shortness of breath dyspnea   . Syncope    resolved    Past Surgical History:  Procedure Laterality Date  . CARDIAC CATHETERIZATION    . CATARACT EXTRACTION W/PHACO Left 06/03/2015   Procedure: CATARACT EXTRACTION PHACO AND INTRAOCULAR LENS PLACEMENT (IOC);  Surgeon: Eulogio Bear, MD;  Location: [facility] ORS;  Service: Ophthalmology;  Laterality: Left;  Lot# [id number] H [place]: 1.06 AP%: 5.5 CDE: 3.69  . CORONARY ARTERY BYPASS GRAFT    . EYE SURGERY    .  PARS PLANA VITRECTOMY Right 02/17/2015   Procedure: PARS PLANA VITRECTOMY WITH 25 GAUGE, endolaser, right eye;  Surgeon: Milus Height, MD;  Location: [facility] OR[facility];  Service: Ophthalmology;  Laterality: Right;  fluid pack lot # [id number] H endolaser: 200w, [id number]  . PERCUTANEOUS STENT INTERVENTION     right leg  . PERIPHERALLY INSERTED CENTRAL CATHETER INSERTION  2013  . TOE AMPUTATION     left 5th, right 4th and 5th    Family History  Problem Relation Age of Onset  . Hyperlipidemia Brother     Social History   Social History  . Marital status: Divorced    Spouse name: N/A  . Number of children: 0  . Years of education: N/A   Occupational History  . [name]     Disabled since 2000's   Social History Main Topics  . Smoking status: Never Smoker  .  Smokeless tobacco: Never Used  . Alcohol use No  . Drug use: No  . Sexual activity: Not on file   Other Topics Concern  . Not on file   Social History Narrative   No living will   Requests [name], sister, as health care POA.   Would accept resuscitation attempts   Not sure about tube feeds   Review of Systems Appetite is good Weight is slightly up again Sleeps okay Has been trying celery and carrots (or juice). Saw it on U-tube (discussed)    Objective:   Physical Exam  Constitutional: He appears well-nourished. No distress.  Neck: No thyromegaly present.  Cardiovascular: Normal rate, regular rhythm and normal heart sounds.  Exam reveals no gallop.   No murmur heard. Faint pedal pulses  Pulmonary/Chest: Effort normal and breath sounds normal. No respiratory distress. He has no wheezes. He has no rales.  Abdominal: Soft. There is no tenderness.  Musculoskeletal: He exhibits no edema.  Stable left 5th toe amputation and right 4th and 5th   Lymphadenopathy:    He has no cervical adenopathy.  Neurological:  Decreased sensation in plantar feet  Skin:  Slight mycotic changes in toenails  Psychiatric: He has a normal mood and affect. His behavior is normal.          Assessment & Plan:

## 2015-12-20 NOTE — Assessment & Plan Note (Signed)
On losartan

## 2015-12-20 NOTE — Assessment & Plan Note (Signed)
Sites look clean and dry 

## 2015-12-20 NOTE — Assessment & Plan Note (Signed)
Still in remission Will continue sertraline indefinitely

## 2015-12-20 NOTE — Addendum Note (Signed)
Addended by: Ellamae Sia on: 12/20/2015 04:29 PM   Modules accepted: Orders

## 2015-12-20 NOTE — Assessment & Plan Note (Signed)
Ongoing treatment with warfarin

## 2015-12-20 NOTE — Assessment & Plan Note (Signed)
Pulses seem better No claudication but doesn't really push it ever

## 2015-12-20 NOTE — Progress Notes (Signed)
Pre visit review using our clinic review tool, if applicable. No additional management support is needed unless otherwise documented below in the visit note. 

## 2015-12-20 NOTE — Assessment & Plan Note (Signed)
Seems to still have good control Due for labs

## 2015-12-21 LAB — CBC WITH DIFFERENTIAL/PLATELET
BASOS PCT: 0.3 % (ref 0.0–3.0)
Basophils Absolute: 0 10*3/uL (ref 0.0–0.1)
EOS ABS: 0.1 10*3/uL (ref 0.0–0.7)
EOS PCT: 1.1 % (ref 0.0–5.0)
HEMATOCRIT: 37 % — AB (ref 39.0–52.0)
HEMOGLOBIN: 12.3 g/dL — AB (ref 13.0–17.0)
LYMPHS PCT: 19.5 % (ref 12.0–46.0)
Lymphs Abs: 1.6 10*3/uL (ref 0.7–4.0)
MCHC: 33.3 g/dL (ref 30.0–36.0)
MCV: 81.5 fl (ref 78.0–100.0)
Monocytes Absolute: 0.5 10*3/uL (ref 0.1–1.0)
Monocytes Relative: 6.6 % (ref 3.0–12.0)
Neutro Abs: 6 10*3/uL (ref 1.4–7.7)
Neutrophils Relative %: 72.5 % (ref 43.0–77.0)
Platelets: 273 10*3/uL (ref 150.0–400.0)
RBC: 4.54 Mil/uL (ref 4.22–5.81)
RDW: 14.8 % (ref 11.5–15.5)
WBC: 8.2 10*3/uL (ref 4.0–10.5)

## 2015-12-21 LAB — COMPREHENSIVE METABOLIC PANEL
ALBUMIN: 4.2 g/dL (ref 3.5–5.2)
ALK PHOS: 53 U/L (ref 39–117)
ALT: 22 U/L (ref 0–53)
AST: 24 U/L (ref 0–37)
BILIRUBIN TOTAL: 0.5 mg/dL (ref 0.2–1.2)
BUN: 19 mg/dL (ref 6–23)
CALCIUM: 9.5 mg/dL (ref 8.4–10.5)
CHLORIDE: 102 meq/L (ref 96–112)
CO2: 33 mEq/L — ABNORMAL HIGH (ref 19–32)
CREATININE: 1.33 mg/dL (ref 0.40–1.50)
GFR: 57.54 mL/min — ABNORMAL LOW (ref >60.00)
Glucose, Bld: 142 mg/dL — ABNORMAL HIGH (ref 70–99)
Potassium: 5.2 mEq/L — ABNORMAL HIGH (ref 3.5–5.1)
Sodium: 140 mEq/L (ref 135–145)
TOTAL PROTEIN: 7 g/dL (ref 6.0–8.3)

## 2015-12-21 LAB — LIPID PANEL
CHOLESTEROL: 118 mg/dL (ref 0–200)
HDL: 34.2 mg/dL — ABNORMAL LOW (ref >39.00)
LDL CALC: 51 mg/dL (ref 0–99)
NonHDL: 83.33
Total CHOL/HDL Ratio: 3
Triglycerides: 163 mg/dL — ABNORMAL HIGH (ref 0.0–149.0)
VLDL: 32.6 mg/dL (ref 0.0–40.0)

## 2015-12-21 LAB — HEMOGLOBIN A1C: HEMOGLOBIN A1C: 6.7 % — AB (ref 4.6–6.5)

## 2015-12-29 ENCOUNTER — Other Ambulatory Visit: Payer: Self-pay | Admitting: Internal Medicine

## 2015-12-31 ENCOUNTER — Other Ambulatory Visit: Payer: Self-pay | Admitting: Internal Medicine

## 2016-01-17 ENCOUNTER — Telehealth: Payer: Self-pay | Admitting: Internal Medicine

## 2016-01-17 NOTE — Telephone Encounter (Signed)
Pt dropped off jury summons. Please call [phone] Placing in rx tower  Thanks

## 2016-01-18 ENCOUNTER — Ambulatory Visit (INDEPENDENT_AMBULATORY_CARE_PROVIDER_SITE_OTHER): Payer: Medicare HMO

## 2016-01-18 DIAGNOSIS — Z86718 Personal history of other venous thrombosis and embolism: Secondary | ICD-10-CM | POA: Diagnosis not present

## 2016-01-18 DIAGNOSIS — Z7901 Long term (current) use of anticoagulants: Secondary | ICD-10-CM | POA: Diagnosis not present

## 2016-01-18 DIAGNOSIS — Z5181 Encounter for therapeutic drug level monitoring: Secondary | ICD-10-CM

## 2016-01-18 LAB — POCT INR: INR: 2.5

## 2016-01-18 NOTE — Telephone Encounter (Signed)
Find out why he feels he cannot serve on jury

## 2016-01-18 NOTE — Telephone Encounter (Signed)
Patient states that he is requesting release from jury duty due to his cognitive disability.  He is unable to follow the thought pattern or process the information that is necessary to make decisions of this nature.  He has a POA that manages his finances, writes checks and helps make care decisions.    He does not feel that he is appropriate for this type of responsibility.    Please advise if you can approve the jury duty release paperwork.

## 2016-01-18 NOTE — Telephone Encounter (Signed)
Pt stated he has problem with memory Pt stated he will be here at 2 for labs

## 2016-01-18 NOTE — Patient Instructions (Signed)
Pre visit review using our clinic review tool, if applicable. No additional management support is needed unless otherwise documented below in the visit note. 

## 2016-01-18 NOTE — Telephone Encounter (Signed)
Left message for pt to call office. I need to know why he needs to be removed from jury duty.

## 2016-01-19 ENCOUNTER — Encounter: Payer: Self-pay | Admitting: Internal Medicine

## 2016-01-19 DIAGNOSIS — Z7689 Persons encountering health services in other specified circumstances: Secondary | ICD-10-CM

## 2016-01-19 NOTE — Telephone Encounter (Signed)
Please give me back the paperwork

## 2016-01-19 NOTE — Telephone Encounter (Signed)
Let him know that I have no documentation to support a cognitive problem for his deferral. I can use the DVT diagnosis though--as he can see in my letter

## 2016-01-19 NOTE — Telephone Encounter (Signed)
I spoke with patient and let him know letter is ready and of DVT diagnosis and charge.

## 2016-01-26 DIAGNOSIS — E1159 Type 2 diabetes mellitus with other circulatory complications: Secondary | ICD-10-CM | POA: Diagnosis not present

## 2016-02-01 ENCOUNTER — Other Ambulatory Visit: Payer: Self-pay | Admitting: Internal Medicine

## 2016-02-02 DIAGNOSIS — E785 Hyperlipidemia, unspecified: Secondary | ICD-10-CM | POA: Diagnosis not present

## 2016-02-02 DIAGNOSIS — Z86718 Personal history of other venous thrombosis and embolism: Secondary | ICD-10-CM | POA: Diagnosis not present

## 2016-02-02 DIAGNOSIS — Z0000 Encounter for general adult medical examination without abnormal findings: Secondary | ICD-10-CM | POA: Diagnosis not present

## 2016-02-02 DIAGNOSIS — I509 Heart failure, unspecified: Secondary | ICD-10-CM | POA: Diagnosis not present

## 2016-02-02 DIAGNOSIS — I110 Hypertensive heart disease with heart failure: Secondary | ICD-10-CM | POA: Diagnosis not present

## 2016-02-02 DIAGNOSIS — E1140 Type 2 diabetes mellitus with diabetic neuropathy, unspecified: Secondary | ICD-10-CM | POA: Diagnosis not present

## 2016-02-02 DIAGNOSIS — E1151 Type 2 diabetes mellitus with diabetic peripheral angiopathy without gangrene: Secondary | ICD-10-CM | POA: Diagnosis not present

## 2016-02-02 DIAGNOSIS — E669 Obesity, unspecified: Secondary | ICD-10-CM | POA: Diagnosis not present

## 2016-02-02 DIAGNOSIS — R69 Illness, unspecified: Secondary | ICD-10-CM | POA: Diagnosis not present

## 2016-02-02 DIAGNOSIS — I2510 Atherosclerotic heart disease of native coronary artery without angina pectoris: Secondary | ICD-10-CM | POA: Diagnosis not present

## 2016-02-02 DIAGNOSIS — Z7901 Long term (current) use of anticoagulants: Secondary | ICD-10-CM | POA: Diagnosis not present

## 2016-02-15 ENCOUNTER — Other Ambulatory Visit: Payer: Self-pay | Admitting: Internal Medicine

## 2016-02-22 ENCOUNTER — Other Ambulatory Visit: Payer: Self-pay | Admitting: *Deleted

## 2016-02-22 MED ORDER — FUROSEMIDE 20 MG PO TABS: ORAL_TABLET | ORAL | 3 refills | Status: DC

## 2016-02-23 DIAGNOSIS — E113511 Type 2 diabetes mellitus with proliferative diabetic retinopathy with macular edema, right eye: Secondary | ICD-10-CM | POA: Diagnosis not present

## 2016-02-28 ENCOUNTER — Ambulatory Visit (INDEPENDENT_AMBULATORY_CARE_PROVIDER_SITE_OTHER): Payer: Medicare HMO

## 2016-02-28 ENCOUNTER — Telehealth: Payer: Self-pay

## 2016-02-28 DIAGNOSIS — Z5181 Encounter for therapeutic drug level monitoring: Secondary | ICD-10-CM | POA: Diagnosis not present

## 2016-02-28 DIAGNOSIS — Z86718 Personal history of other venous thrombosis and embolism: Secondary | ICD-10-CM

## 2016-02-28 DIAGNOSIS — Z7901 Long term (current) use of anticoagulants: Secondary | ICD-10-CM | POA: Diagnosis not present

## 2016-02-28 LAB — POCT INR: INR: 2.4

## 2016-02-28 NOTE — Telephone Encounter (Signed)
Left message to call office

## 2016-02-28 NOTE — Telephone Encounter (Signed)
Please ask him to try the metformin ER instead. 500mg  once a day Usually causes less diarrhea Okay if he has to take it in the evening--but hopefully he can tolerate better (it is best to stay on the metformin as well as the insulin)

## 2016-02-28 NOTE — Telephone Encounter (Signed)
Patient in today for coumadin check.  While here he reports that he has been having a lot of loose, uncontrollable bowel movements and as a result has changed the way he is taking his metformin.  He only takes it at night and refuses to take it in the am due to uncontrollable episodes while out in public during the day.   He has just started this change and his blood sugars are running 180's at night and 140's in ams.    Please advise if you recommend a change. Patient is taking his insulin as prescribed and would like to be off of oral hypoglycemics, if possible.  Last A1C on 12/2015 was 6.7.

## 2016-02-28 NOTE — Patient Instructions (Signed)
Pre visit review using our clinic review tool, if applicable. No additional management support is needed unless otherwise documented below in the visit note. 

## 2016-02-29 ENCOUNTER — Ambulatory Visit: Payer: Medicare HMO

## 2016-03-15 DIAGNOSIS — H26492 Other secondary cataract, left eye: Secondary | ICD-10-CM | POA: Diagnosis not present

## 2016-03-28 NOTE — Telephone Encounter (Signed)
Spoke to pt. He said he has been taking 1 of the regular Metformin. He will keep a check on his blood sugar and can discuss any changes after his next A1C

## 2016-04-11 ENCOUNTER — Ambulatory Visit (INDEPENDENT_AMBULATORY_CARE_PROVIDER_SITE_OTHER): Payer: Medicare HMO

## 2016-04-11 DIAGNOSIS — Z86718 Personal history of other venous thrombosis and embolism: Secondary | ICD-10-CM | POA: Diagnosis not present

## 2016-04-11 DIAGNOSIS — Z7901 Long term (current) use of anticoagulants: Secondary | ICD-10-CM | POA: Diagnosis not present

## 2016-04-11 DIAGNOSIS — Z5181 Encounter for therapeutic drug level monitoring: Secondary | ICD-10-CM | POA: Diagnosis not present

## 2016-04-11 LAB — POCT INR: INR: 3.8

## 2016-04-11 NOTE — Patient Instructions (Signed)
Pre visit review using our clinic review tool, if applicable. No additional management support is needed unless otherwise documented below in the visit note.  INR Today 3.8  After extensively reviewing patients diet, medications and recent health history, patient reports taking a new over the counter ginseng/Tumeric/Cherry liquid supplement which he started on his own in past 1-2 weeks.  We discussed the concerns with this supplement interacting with his warfarin and increasing his risk for bleeding.  This, likely, has contributed to his supratherapeutic level today.  I instructed patient to stop the supplement immediately and to not start ANY OTC herbal supplements or meds without discussing first with either myself or his physician.    Patient denies any unusual bruising or bleeding and understands if any concerns develop he is to go to the Er.  I instructed patient to hold his coumadin today and then start decreased dosing of taking 1 (5mg ) pill daily until recheck with me in 3 weeks.  Patient verbalizes understanding of all instructions given today and a written copy was given and reviewed.

## 2016-04-25 DIAGNOSIS — E1159 Type 2 diabetes mellitus with other circulatory complications: Secondary | ICD-10-CM | POA: Diagnosis not present

## 2016-05-02 ENCOUNTER — Ambulatory Visit (INDEPENDENT_AMBULATORY_CARE_PROVIDER_SITE_OTHER): Payer: Medicare HMO

## 2016-05-02 ENCOUNTER — Other Ambulatory Visit: Payer: Self-pay | Admitting: Cardiovascular Disease

## 2016-05-02 DIAGNOSIS — Z5181 Encounter for therapeutic drug level monitoring: Secondary | ICD-10-CM

## 2016-05-02 DIAGNOSIS — Z86718 Personal history of other venous thrombosis and embolism: Secondary | ICD-10-CM

## 2016-05-02 DIAGNOSIS — Z7901 Long term (current) use of anticoagulants: Secondary | ICD-10-CM

## 2016-05-02 LAB — POCT INR: INR: 2.9

## 2016-05-02 MED ORDER — CARVEDILOL 6.25 MG PO TABS: 6.2500 mg | ORAL_TABLET | Freq: Two times a day (BID) | ORAL | 2 refills | Status: DC

## 2016-05-02 NOTE — Patient Instructions (Signed)
Pre visit review using our clinic review tool, if applicable. No additional management support is needed unless otherwise documented below in the visit note.  INR today 2.9  Patient reports not feeling well today and gait appeared unsteady.  States that he needs to eat and is a diabetic.  This Probation officer checked FSBS level at 84 ( which is normal) but patient reports not eating in several hours.  This Probation officer offers patient some peanut butter crackers and juice, which he declined.  Patient states he is going home now to fix him some lunch.  We discussed importance of regular meals 3 times per day with in between protein snacks to help regulate sugars.  Coumadin today was WNL and patient has stopped taking the Tumeric and Ginseng supplements that he started before last visits.    Patient verbalizes understanding of all instructions given regarding diabetic diet and coumadin management.  Will have him continue same coumadin dose and recheck here in 4 weeks.

## 2016-05-30 ENCOUNTER — Ambulatory Visit: Payer: Medicare HMO

## 2016-05-30 ENCOUNTER — Other Ambulatory Visit: Payer: Self-pay

## 2016-05-30 ENCOUNTER — Ambulatory Visit (INDEPENDENT_AMBULATORY_CARE_PROVIDER_SITE_OTHER): Payer: Medicare HMO

## 2016-05-30 DIAGNOSIS — Z5181 Encounter for therapeutic drug level monitoring: Secondary | ICD-10-CM

## 2016-05-30 DIAGNOSIS — Z86718 Personal history of other venous thrombosis and embolism: Secondary | ICD-10-CM

## 2016-05-30 DIAGNOSIS — Z7901 Long term (current) use of anticoagulants: Secondary | ICD-10-CM

## 2016-05-30 LAB — POCT INR: INR: 3.2

## 2016-05-30 NOTE — Patient Instructions (Signed)
Pre visit review using our clinic review tool, if applicable. No additional management support is needed unless otherwise documented below in the visit note. 

## 2016-06-20 ENCOUNTER — Ambulatory Visit: Payer: Medicare HMO

## 2016-06-20 ENCOUNTER — Telehealth: Payer: Self-pay

## 2016-06-20 NOTE — Telephone Encounter (Signed)
Patient was a no show for his coumadin clinic visit today (5/22) at 3:30pm.  I called and LM on his home answering machine to please call back and reschedule for Thursday or Friday of this week.

## 2016-06-28 ENCOUNTER — Ambulatory Visit (INDEPENDENT_AMBULATORY_CARE_PROVIDER_SITE_OTHER): Payer: Medicare HMO

## 2016-06-28 ENCOUNTER — Ambulatory Visit (INDEPENDENT_AMBULATORY_CARE_PROVIDER_SITE_OTHER): Payer: Medicare HMO | Admitting: Internal Medicine

## 2016-06-28 ENCOUNTER — Encounter: Payer: Self-pay | Admitting: Internal Medicine

## 2016-06-28 VITALS — BP 130/84 | HR 73 | Temp 98.2°F | Ht 68.5 in | Wt 258.0 lb

## 2016-06-28 DIAGNOSIS — I739 Peripheral vascular disease, unspecified: Secondary | ICD-10-CM | POA: Diagnosis not present

## 2016-06-28 DIAGNOSIS — N183 Chronic kidney disease, stage 3 unspecified: Secondary | ICD-10-CM

## 2016-06-28 DIAGNOSIS — F3340 Major depressive disorder, recurrent, in remission, unspecified: Secondary | ICD-10-CM | POA: Diagnosis not present

## 2016-06-28 DIAGNOSIS — E1142 Type 2 diabetes mellitus with diabetic polyneuropathy: Secondary | ICD-10-CM | POA: Diagnosis not present

## 2016-06-28 DIAGNOSIS — S98139A Complete traumatic amputation of one unspecified lesser toe, initial encounter: Secondary | ICD-10-CM

## 2016-06-28 DIAGNOSIS — E1122 Type 2 diabetes mellitus with diabetic chronic kidney disease: Secondary | ICD-10-CM | POA: Diagnosis not present

## 2016-06-28 DIAGNOSIS — Z0000 Encounter for general adult medical examination without abnormal findings: Secondary | ICD-10-CM

## 2016-06-28 DIAGNOSIS — Z23 Encounter for immunization: Secondary | ICD-10-CM | POA: Diagnosis not present

## 2016-06-28 DIAGNOSIS — Z5181 Encounter for therapeutic drug level monitoring: Secondary | ICD-10-CM | POA: Diagnosis not present

## 2016-06-28 DIAGNOSIS — Z89429 Acquired absence of other toe(s), unspecified side: Secondary | ICD-10-CM | POA: Diagnosis not present

## 2016-06-28 DIAGNOSIS — I82502 Chronic embolism and thrombosis of unspecified deep veins of left lower extremity: Secondary | ICD-10-CM | POA: Diagnosis not present

## 2016-06-28 DIAGNOSIS — Z7901 Long term (current) use of anticoagulants: Secondary | ICD-10-CM

## 2016-06-28 DIAGNOSIS — I10 Essential (primary) hypertension: Secondary | ICD-10-CM

## 2016-06-28 DIAGNOSIS — Z1211 Encounter for screening for malignant neoplasm of colon: Secondary | ICD-10-CM

## 2016-06-28 DIAGNOSIS — R69 Illness, unspecified: Secondary | ICD-10-CM | POA: Diagnosis not present

## 2016-06-28 LAB — RENAL FUNCTION PANEL
ALBUMIN: 4.5 g/dL (ref 3.5–5.2)
BUN: 15 mg/dL (ref 6–23)
CHLORIDE: 104 meq/L (ref 96–112)
CO2: 31 meq/L (ref 19–32)
Calcium: 9.7 mg/dL (ref 8.4–10.5)
Creatinine, Ser: 1.08 mg/dL (ref 0.40–1.50)
GFR: 73.05 mL/min (ref >60.00)
Glucose, Bld: 113 mg/dL — ABNORMAL HIGH (ref 70–99)
Phosphorus: 2.7 mg/dL (ref 2.3–4.6)
Potassium: 4.3 mEq/L (ref 3.5–5.1)
Sodium: 138 mEq/L (ref 135–145)

## 2016-06-28 LAB — HEMOGLOBIN A1C: Hgb A1c MFr Bld: 7.5 % — ABNORMAL HIGH (ref 4.6–6.5)

## 2016-06-28 LAB — POCT INR: INR: 4

## 2016-06-28 NOTE — Assessment & Plan Note (Signed)
No pain or open areas at site

## 2016-06-28 NOTE — Assessment & Plan Note (Signed)
No pain but isn't really active

## 2016-06-28 NOTE — Assessment & Plan Note (Signed)
Seems to still have reasonable control Neuropathy probably causing balance problems--but will set up eval with Dr Lorelei Pont due to his amputations

## 2016-06-28 NOTE — Assessment & Plan Note (Signed)
BP Readings from Last 3 Encounters:  06/28/16 130/84  12/20/15 128/80  11/02/15 120/64   Good control

## 2016-06-28 NOTE — Addendum Note (Signed)
Addended by: Emelia Salisbury C on: 06/28/2016 02:22 PM   Modules accepted: Orders

## 2016-06-28 NOTE — Assessment & Plan Note (Signed)
Will continue sertraline indefinitely

## 2016-06-28 NOTE — Assessment & Plan Note (Signed)
On ARB 

## 2016-06-28 NOTE — Assessment & Plan Note (Signed)
I have personally reviewed the Medicare Annual Wellness questionnaire and have noted 1. The patient's medical and social history 2. Their use of alcohol, tobacco or illicit drugs 3. Their current medications and supplements 4. The patient's functional ability including ADL's, fall risks, home safety risks and hearing or visual             impairment. 5. Diet and physical activities 6. Evidence for depression or mood disorders  The patients weight, height, BMI and visual acuity have been recorded in the chart I have made referrals, counseling and provided education to the patient based review of the above and I have provided the pt with a written personalized care plan for preventive services.  I have provided you with a copy of your personalized plan for preventive services. Please take the time to review along with your updated medication list.  Will give prevnar FIT Discussed PSA---he wishes to defer Discussed lifestyle

## 2016-06-28 NOTE — Patient Instructions (Signed)
Pre visit review using our clinic review tool, if applicable. No additional management support is needed unless otherwise documented below in the visit note. 

## 2016-06-28 NOTE — Progress Notes (Signed)
Subjective:    Patient ID: Benjamin Davidson, male    DOB: Sep 14, 1950, 66 y.o.   MRN: [id number]  HPI Here for Medicare wellness visit and follow up of chronic health conditions Reviewed form and advanced directives Reviewed other doctors--Benjamin Davidson (eye), Benjamin Davidson (cardiologist), no dentist or vascular surgeon No alcohol or tobacco Tries to walk some--but limited exercise Cataract on right--may need surgery soon. Left was done Hearing is good No falls Independent with instrumental ADLs No major memory issues  Benjamin Davidson is concerned about his balance when he first stands up Thinks he may need orthotic Does have flat foot on right  Prior amputations--2 on right, 1 on left Discussed further evaluation Ongoing numbness in feet--- neuropathy may be related. Discussed using cane  Notices shaking in left hand--mostly family, but not as much him noticing Intermittent and no functional problems Benjamin Davidson also had tremor--but was alcoholic  Has cut metformin to once a day in evening Was having too much bowel problems Checks sugars 2-3 times a day Will adjusts novolog based on this Has had a couple of mild hypoglycemic spells-- will eat something  Left leg swelling is chronic--about the same No pain in leg for the most part  Mood is fine No recurrence of depression  Current Outpatient Prescriptions on File Prior to Visit  Medication Sig Dispense Refill  . aspirin EC 325 MG EC tablet Take 325 mg by mouth daily.      . carvedilol (COREG) 6.25 MG tablet Take 1 tablet (6.25 mg total) by mouth 2 (two) times daily. 180 tablet 2  . furosemide (LASIX) 20 MG tablet TAKE 1 TABLET BY MOUTH ONCE A DAY ** MAYTAKE ONE ADDITIONAL TABLET IFINCREASED SWELLING 60 tablet 3  . gabapentin (NEURONTIN) 300 MG capsule TAKE 1 CAPSULE BY MOUTH TWICE DAILY 60 capsule 11  . Insulin Pen Needle (NOVOFINE 31) 31G X 6 MM MISC by Does not apply route.      . Insulin Syringe-Needle U-100 (PRODIGY INSULIN SYRINGE) 31G X 5/16"  0.5 ML MISC Inject 1 Syringe into the skin 2 (two) times daily. 100 each 7  . losartan (COZAAR) 25 MG tablet Take 1 tablet (25 mg total) by mouth daily. 90 tablet 3  . metFORMIN (GLUCOPHAGE) 500 MG tablet TAKE 1 TABLET BY MOUTH 2 TIMES DAILY WITH MEAL (Patient taking differently: TAKE 1 TABLET BY MOUTH Daily) 60 tablet 11  . NOVOLIN 70/30 (70-30) 100 UNIT/ML injection INJECT 35 UNITS SUBCUTANEOUSLY TWICE A DAY AS DIRECTED 20 mL 5  . NOVOLIN R 100 UNIT/ML injection as needed.     . rosuvastatin (CRESTOR) 20 MG tablet Take 1 tablet (20 mg total) by mouth daily. 90 tablet 3  . sertraline (ZOLOFT) 100 MG tablet TAKE 1 TABLET BY MOUTH ONCE A DAY 90 tablet 3  . warfarin (COUMADIN) 5 MG tablet TAKE AS DIRECTED BY ANTICOAGULATION CLINIC 100 tablet 0   No current facility-administered medications on file prior to visit.     Allergies  Allergen Reactions  . Lisinopril     Cough Flu like symptoms     Past Medical History:  Diagnosis Date  . Anemia   . Asthma    childhood  . CHF (congestive heart failure) ([place])   . Coronary artery disease   . DM (diabetes mellitus), type 2, uncontrolled, periph vascular complic ([place])    Type II  . Edema    feet/legs  . Hypertension   . Left leg DVT ([place])   . MDD (recurrent major  depressive disorder) in remission ([place])   . Peripheral vascular disease (HCC)    Left toe amputations. Benjamin Davidson  . Pure hypercholesterolemia   . Shortness of breath dyspnea   . Syncope    resolved    Past Surgical History:  Procedure Laterality Date  . CARDIAC CATHETERIZATION    . CATARACT EXTRACTION W/PHACO Left 06/03/2015   Procedure: CATARACT EXTRACTION PHACO AND INTRAOCULAR LENS PLACEMENT (IOC);  Surgeon: Benjamin Bear, MD;  Location: [facility] ORS;  Service: Ophthalmology;  Laterality: Left;  Lot# [id number] H [place]: 1.06 AP%: 5.5 CDE: 3.69  . CORONARY ARTERY BYPASS GRAFT    . EYE SURGERY    . PARS PLANA VITRECTOMY Right 02/17/2015   Procedure: PARS PLANA VITRECTOMY WITH 25  GAUGE, endolaser, right eye;  Surgeon: Benjamin Height, MD;  Location: [facility] ORS;  Service: Ophthalmology;  Laterality: Right;  fluid pack lot # [id number] H endolaser: 200w, 3790W  . PERCUTANEOUS STENT INTERVENTION     right leg  . PERIPHERALLY INSERTED CENTRAL CATHETER INSERTION  2013  . TOE AMPUTATION     left 5th, right 4th and 5th    Family History  Problem Relation Age of Onset  . Hyperlipidemia Brother     Social History   Social History  . Marital status: Divorced    Spouse name: N/A  . Number of children: 0  . Years of education: N/A   Occupational History  . Benjamin Davidson     Disabled since 2000's   Social History Main Topics  . Smoking status: Never Smoker  . Smokeless tobacco: Never Used  . Alcohol use No  . Drug use: No  . Sexual activity: Not on file   Other Topics Concern  . Not on file   Social History Narrative   No living will   Requests Benjamin Davidson, Benjamin Davidson, as health care POA. May have done official forms   Would accept resuscitation attempts   Not sure about tube feeds   Review of Systems Teeth okay--doesn't keep up with dentist Wears seat belt Sleeps okay Appetite is good--limited diet Weight is fairly stable Bowels are okay. No blood  Voids okay. Nocturia is variable. Flow is fine No rash or suspicious skin lesions Some left shoulder pain-- uses acetaminophen prn    Objective:   Physical Exam  Constitutional: He is oriented to person, place, and time. He appears well-nourished. No distress.  HENT:  Mouth/Throat: Oropharynx is clear and moist. No oropharyngeal exudate.  Neck: No thyromegaly present.  Cardiovascular: Normal rate, regular rhythm and normal heart sounds.  Exam reveals no gallop.   No murmur heard. Feet warm with faint pulses  Pulmonary/Chest: Effort normal and breath sounds normal. No respiratory distress. He has no wheezes. He has no rales.  Abdominal: Soft. There is no tenderness.  Musculoskeletal: He exhibits no edema.    Right 4th and 5th, left 5th amputations  Lymphadenopathy:    He has no cervical adenopathy.  Neurological: He is alert and oriented to person, place, and time.  President-- "Benjamin Davidson, Benjamin Davidson, Benjamin Davidson" 100-93-?--"not good with numbers" Problem with spelling also Recall 3/3  Skin:  No foot lesions  Psychiatric: He has a normal mood and affect. His behavior is normal.          Assessment & Plan:

## 2016-06-28 NOTE — Assessment & Plan Note (Signed)
Chronic On warfarin still

## 2016-07-07 ENCOUNTER — Ambulatory Visit (INDEPENDENT_AMBULATORY_CARE_PROVIDER_SITE_OTHER): Payer: Medicare HMO | Admitting: Internal Medicine

## 2016-07-07 ENCOUNTER — Encounter: Payer: Self-pay | Admitting: Internal Medicine

## 2016-07-07 VITALS — BP 124/82 | HR 70 | Temp 97.8°F | Wt 258.0 lb

## 2016-07-07 DIAGNOSIS — J209 Acute bronchitis, unspecified: Secondary | ICD-10-CM | POA: Diagnosis not present

## 2016-07-07 NOTE — Assessment & Plan Note (Signed)
Bad in first 2 days but progressively improving Discussed likely viral etiology Supportive care If worsens over next few days, would try empiric amoxil (he will call 6/11)

## 2016-07-07 NOTE — Progress Notes (Signed)
Subjective:    Patient ID: Benjamin Davidson, male    DOB: 18-Jul-1950, 66 y.o.   MRN: [id number]  HPI Here due to respiratory infection Wasn't sick at last week's appt  Started about 1 week ago Thought he was having allergies--- cough, rhinorrhea Felt bad and needed to stay in bed for 2 days (at the start) Does feel some better Mild cough persists--dry now but was productive Slight SOB--- wheezing at night briefly  No fever No chills or sweats No ear pain Slight sore throat  Used OTC med---?loratadine. No clear help  Current Outpatient Prescriptions on File Prior to Visit  Medication Sig Dispense Refill  . aspirin EC 325 MG EC tablet Take 325 mg by mouth daily.      . carvedilol (COREG) 6.25 MG tablet Take 1 tablet (6.25 mg total) by mouth 2 (two) times daily. 180 tablet 2  . furosemide (LASIX) 20 MG tablet TAKE 1 TABLET BY MOUTH ONCE A DAY ** MAYTAKE ONE ADDITIONAL TABLET IFINCREASED SWELLING 60 tablet 3  . gabapentin (NEURONTIN) 300 MG capsule TAKE 1 CAPSULE BY MOUTH TWICE DAILY 60 capsule 11  . Insulin Pen Needle (NOVOFINE 31) 31G X 6 MM MISC by Does not apply route.      . Insulin Syringe-Needle U-100 (PRODIGY INSULIN SYRINGE) 31G X 5/16" 0.5 ML MISC Inject 1 Syringe into the skin 2 (two) times daily. 100 each 7  . losartan (COZAAR) 25 MG tablet Take 1 tablet (25 mg total) by mouth daily. 90 tablet 3  . metFORMIN (GLUCOPHAGE) 500 MG tablet TAKE 1 TABLET BY MOUTH 2 TIMES DAILY WITH MEAL (Patient taking differently: TAKE 1 TABLET BY MOUTH Daily) 60 tablet 11  . NOVOLIN 70/30 (70-30) 100 UNIT/ML injection INJECT 35 UNITS SUBCUTANEOUSLY TWICE A DAY AS DIRECTED 20 mL 5  . NOVOLIN R 100 UNIT/ML injection as needed.     . rosuvastatin (CRESTOR) 20 MG tablet Take 1 tablet (20 mg total) by mouth daily. 90 tablet 3  . sertraline (ZOLOFT) 100 MG tablet TAKE 1 TABLET BY MOUTH ONCE A DAY 90 tablet 3  . warfarin (COUMADIN) 5 MG tablet TAKE AS DIRECTED BY ANTICOAGULATION CLINIC 100 tablet 0    No current facility-administered medications on file prior to visit.     Allergies  Allergen Reactions  . Lisinopril     Cough Flu like symptoms     Past Medical History:  Diagnosis Date  . Anemia   . Asthma    childhood  . CHF (congestive heart failure) ([place])   . Coronary artery disease   . DM (diabetes mellitus), type 2, uncontrolled, periph vascular complic ([place])    Type II  . Edema    feet/legs  . Hypertension   . Left leg DVT ([place])   . MDD (recurrent major depressive disorder) in remission ([place])   . Peripheral vascular disease (HCC)    Left toe amputations. Dr Lucky Cowboy  . Pure hypercholesterolemia   . Shortness of breath dyspnea   . Syncope    resolved    Past Surgical History:  Procedure Laterality Date  . CARDIAC CATHETERIZATION    . CATARACT EXTRACTION W/PHACO Left 06/03/2015   Procedure: CATARACT EXTRACTION PHACO AND INTRAOCULAR LENS PLACEMENT (IOC);  Surgeon: Eulogio Bear, MD;  Location: [facility];  Service: Ophthalmology;  Laterality: Left;  Lot# [id number] H [place]: 1.06 AP%: 5.5 CDE: 3.69  . CORONARY ARTERY BYPASS GRAFT    . EYE SURGERY    . PARS PLANA VITRECTOMY Right  02/17/2015   Procedure: PARS PLANA VITRECTOMY WITH 25 GAUGE, endolaser, right eye;  Surgeon: Milus Height, MD;  Location: [facility] OR[facility];  Service: Ophthalmology;  Laterality: Right;  fluid pack lot # [id number] H endolaser: 200w, 2706C  . PERCUTANEOUS STENT INTERVENTION     right leg  . PERIPHERALLY INSERTED CENTRAL CATHETER INSERTION  2013  . TOE AMPUTATION     left 5th, right 4th and 5th    Family History  Problem Relation Age of Onset  . Hyperlipidemia Brother     Social History   Social History  . Marital status: Divorced    Spouse name: N/A  . Number of children: 0  . Years of education: N/A   Occupational History  . [name]     Disabled since 2000's   Social History Main Topics  . Smoking status: Never Smoker  . Smokeless tobacco: Never Used  . Alcohol use No  . Drug  use: No  . Sexual activity: Not on file   Other Topics Concern  . Not on file   Social History Narrative   No living will   Requests [name], sister, as health care POA. May have done official forms   Would accept resuscitation attempts   Not sure about tube feeds   Review of Systems  No rash No vomiting or diarrhea Appetite is okay     Objective:   Physical Exam  Constitutional: He appears well-developed. No distress.  HENT:  Mouth/Throat: Oropharynx is clear and moist. No oropharyngeal exudate.  Mild pale nasal congestion TMs normal  Neck: No thyromegaly present.  Pulmonary/Chest: Effort normal and breath sounds normal. No respiratory distress. He has no wheezes. He has no rales.  Lymphadenopathy:    He has no cervical adenopathy.          Assessment & Plan:

## 2016-07-10 ENCOUNTER — Telehealth: Payer: Self-pay

## 2016-07-10 MED ORDER — AMOXICILLIN 500 MG PO TABS: 1000.0000 mg | ORAL_TABLET | Freq: Two times a day (BID) | ORAL | 0 refills | Status: DC

## 2016-07-10 NOTE — Telephone Encounter (Signed)
Pt left v/m; pt was seen 07/07/16; pt is not feeling better and request med(?abx) Dr Silvio Pate spoke of at appt sent to [facility].

## 2016-07-10 NOTE — Telephone Encounter (Signed)
Pt left v/m requesting cb about med to be sent to [facility].

## 2016-07-10 NOTE — Telephone Encounter (Signed)
Left message on vm per dpr 

## 2016-07-10 NOTE — Telephone Encounter (Signed)
Please let him know I sent the prescription.

## 2016-07-11 ENCOUNTER — Ambulatory Visit (INDEPENDENT_AMBULATORY_CARE_PROVIDER_SITE_OTHER): Payer: Medicare HMO

## 2016-07-11 DIAGNOSIS — I82409 Acute embolism and thrombosis of unspecified deep veins of unspecified lower extremity: Secondary | ICD-10-CM | POA: Diagnosis not present

## 2016-07-11 DIAGNOSIS — Z5181 Encounter for therapeutic drug level monitoring: Secondary | ICD-10-CM

## 2016-07-11 DIAGNOSIS — Z7901 Long term (current) use of anticoagulants: Secondary | ICD-10-CM | POA: Diagnosis not present

## 2016-07-11 LAB — POCT INR: INR: 1.8

## 2016-07-11 NOTE — Patient Instructions (Signed)
Pre visit review using our clinic review tool, if applicable. No additional management support is needed unless otherwise documented below in the visit note. 

## 2016-07-17 ENCOUNTER — Institutional Professional Consult (permissible substitution): Payer: Medicare HMO | Admitting: Family Medicine

## 2016-07-19 ENCOUNTER — Ambulatory Visit (INDEPENDENT_AMBULATORY_CARE_PROVIDER_SITE_OTHER): Payer: Medicare HMO | Admitting: Family Medicine

## 2016-07-19 ENCOUNTER — Encounter: Payer: Self-pay | Admitting: Family Medicine

## 2016-07-19 VITALS — BP 130/82 | HR 76 | Temp 97.8°F | Wt 263.0 lb

## 2016-07-19 DIAGNOSIS — E1142 Type 2 diabetes mellitus with diabetic polyneuropathy: Secondary | ICD-10-CM | POA: Diagnosis not present

## 2016-07-19 DIAGNOSIS — S98139A Complete traumatic amputation of one unspecified lesser toe, initial encounter: Secondary | ICD-10-CM

## 2016-07-19 DIAGNOSIS — Z89429 Acquired absence of other toe(s), unspecified side: Secondary | ICD-10-CM | POA: Diagnosis not present

## 2016-07-19 DIAGNOSIS — I739 Peripheral vascular disease, unspecified: Secondary | ICD-10-CM | POA: Diagnosis not present

## 2016-07-19 NOTE — Progress Notes (Signed)
Dr. Frederico Hamman T. Aamirah Salmi, MD, [facility] [place], [address] Phone: [phone] Fax: [phone]  07/19/2016  Patient: Benjamin Davidson, MRN: [id number], DOB: [birth year], 66 y.o.  Primary Physician:  Venia Carbon, MD   Chief Complaint  Patient presents with  . Instability    with standing and walking...   Subjective:   Benjamin Davidson is a 66 y.o. very pleasant male patient who presents with the following:  Some instability and has lost a few toes. No pain. He has a cane here in the office today.  Denies any pain in his feet or ankles currently.  His sister asked him to come in to be evaluated.  Has some neuropathy. Has been a DM for 40 years.   Past Medical History, Surgical History, Social History, Family History, Problem List, Medications, and Allergies have been reviewed and updated if relevant.  Patient Active Problem List   Diagnosis Date Noted  . Acute bronchitis 07/07/2016  . Preventative health care 12/17/2014  . Type 2 diabetes, controlled, with peripheral neuropathy ([place]) 12/17/2014  . Diabetic proliferative retinopathy ([place]) 12/17/2014  . Encounter for therapeutic drug monitoring 08/06/2014  . Peripheral vascular disease ([place])   . Left leg DVT ([place])   . MDD (recurrent major depressive disorder) in remission ([place])   . Long term current use of anticoagulant 04/16/2010  . Hyperlipidemia 06/10/2009  . CAD, ARTERY BYPASS GRAFT 06/10/2009  . VISUAL IMPAIRMENT 02/19/2008  . CKD stage 3 due to type 2 diabetes mellitus ([place]) 01/30/2008  . ALLERGIC RHINITIS 05/30/2007  . Toe amputation status ([place]) 12/31/2006  . Essential hypertension 09/25/2006    Past Medical History:  Diagnosis Date  . Anemia   . Asthma    childhood  . CHF (congestive heart failure) ([place])   . Coronary artery disease   . DM (diabetes mellitus), type 2, uncontrolled, periph vascular complic ([place])    Type II  . Edema    feet/legs  . Hypertension   . Left leg DVT ([place])   . MDD (recurrent major depressive disorder) in remission ([place])   . Peripheral vascular disease (HCC)    Left toe amputations. Dr Lucky Cowboy  . Pure hypercholesterolemia   . Shortness of breath dyspnea   . Syncope    resolved    Past Surgical History:  Procedure Laterality Date  . CARDIAC CATHETERIZATION    . CATARACT EXTRACTION W/PHACO Left 06/03/2015   Procedure: CATARACT EXTRACTION PHACO AND INTRAOCULAR LENS PLACEMENT (IOC);  Surgeon: Eulogio Bear, MD;  Location: [facility] ORS;  Service: Ophthalmology;  Laterality: Left;  Lot# [id number] H [place]: 1.06 AP%: 5.5 CDE: 3.69  . CORONARY ARTERY BYPASS GRAFT    . EYE SURGERY    . PARS PLANA VITRECTOMY Right 02/17/2015   Procedure: PARS PLANA VITRECTOMY WITH 25 GAUGE, endolaser, right eye;  Surgeon: Milus Height, MD;  Location: [facility] ORS;  Service: Ophthalmology;  Laterality: Right;  fluid pack lot # [id number] H endolaser: 200w, 7893Y  . PERCUTANEOUS STENT INTERVENTION     right leg  . PERIPHERALLY INSERTED CENTRAL CATHETER INSERTION  2013  . TOE AMPUTATION     left 5th, right 4th and 5th    Social History   Social History  . Marital status: Divorced    Spouse name: N/A  . Number of children: 0  . Years of education: N/A   Occupational History  . [employer]     Disabled since 2000's  Social History Main Topics  . Smoking status: Never Smoker  . Smokeless tobacco: Never Used  . Alcohol use No  . Drug use: No  . Sexual activity: Not on file   Other Topics Concern  . Not on file   Social History Narrative   No living will   Requests [name], sister, as health care POA. May have done official forms   Would accept resuscitation attempts   Not sure about tube feeds    Family History  Problem Relation Age of Onset  . Hyperlipidemia Brother     Allergies  Allergen Reactions  . Lisinopril     Cough Flu like symptoms     Medication list reviewed and updated in full in  [place].   GEN: No acute illnesses, no fevers, chills. GI: No n/v/d, eating normally Pulm: No SOB Interactive and getting along well at home.  Otherwise, ROS is as per the HPI.  Objective:   BP 130/82   Pulse 76   Temp 97.8 F (36.6 C) (Oral)   Wt 263 lb (119.3 kg)   SpO2 96%   BMI 39.41 kg/m   GEN: WDWN, NAD, Non-toxic, A & O x 3 HEENT: Atraumatic, Normocephalic. Neck supple. No masses, No LAD. Ears and Nose: No external deformity. CV: RRR, No M/G/R. No JVD. No thrill. No extra heart sounds. PULM: CTA B, no wheezes, crackles, rhonchi. No retractions. No resp. distress. No accessory muscle use. EXTR: No c/c/e NEURO mildly unsteady gait PSYCH: Normally interactive. Conversant. Not depressed or anxious appearing.  Calm demeanor.   Patient is had prior surgery on both forefoot's.  He appears to lack 2 toes on the left and one on the right. Bony anatomy is grossly nontender today on exam throughout the foot and ankle.  Laboratory and Imaging Data:  Assessment and Plan:   Amputated toe, unspecified laterality (HCC)  Peripheral vascular disease ([place])  Type 2 diabetes, controlled, with peripheral neuropathy ([place])  I gave him an over-the-counter diabetic insole, which he felt to be very comfortable.  On further questioning, he asked me to try to make him a custom orthotic.  I think that this is reasonable to try.  With his forefoot changes, I will do my best to do such with the [employer] system that I use. If the end result is not desirable, then a non-weightbearing casted orthotic might be a reasonable option.  Follow-up: No Follow-up on file.  Future Appointments Date Time Provider [place]  07/25/2016 2:30 PM [facility]  08/14/2016 2:00 PM Alexi Geibel, Frederico Hamman, MD [facility]  08/24/2016 1:00 PM [facility] V[facility] 1 [facility] None  08/24/2016 2:00 PM [facility] 1 [facility] None  08/24/2016 3:15 PM Schnier, Dolores Lory, MD  [facility] None   Medications Discontinued During This Encounter  Medication Reason  . amoxicillin (AMOXIL) 500 MG tablet Completed Course   Signed,  Deborh Pense T. Vincen Bejar, MD   Allergies as of 07/19/2016      Reactions   Lisinopril    Cough Flu like symptoms      Medication List       Accurate as of 07/19/16 11:59 PM. Always use your most recent med list.          aspirin EC 325 MG tablet Take 325 mg by mouth daily.   carvedilol 6.25 MG tablet Commonly known as:  COREG Take 1 tablet (6.25 mg total) by mouth 2 (two) times daily.   furosemide 20 MG tablet Commonly  known as:  LASIX TAKE 1 TABLET BY MOUTH ONCE A DAY ** MAYTAKE ONE ADDITIONAL TABLET IFINCREASED SWELLING   gabapentin 300 MG capsule Commonly known as:  NEURONTIN TAKE 1 CAPSULE BY MOUTH TWICE DAILY   Insulin Syringe-Needle U-100 31G X 5/16" 0.5 ML Misc Commonly known as:  PRODIGY INSULIN SYRINGE Inject 1 Syringe into the skin 2 (two) times daily.   losartan 25 MG tablet Commonly known as:  COZAAR Take 1 tablet (25 mg total) by mouth daily.   metFORMIN 500 MG tablet Commonly known as:  GLUCOPHAGE TAKE 1 TABLET BY MOUTH 2 TIMES DAILY WITH MEAL   NOVOFINE 31 31G X 6 MM Misc Generic drug:  Insulin Pen Needle by Does not apply route.   NOVOLIN 70/30 (70-30) 100 UNIT/ML injection Generic drug:  insulin NPH-regular Human INJECT 35 UNITS SUBCUTANEOUSLY TWICE A DAY AS DIRECTED   NOVOLIN R 100 units/mL injection Generic drug:  insulin regular as needed.   rosuvastatin 20 MG tablet Commonly known as:  CRESTOR Take 1 tablet (20 mg total) by mouth daily.   sertraline 100 MG tablet Commonly known as:  ZOLOFT TAKE 1 TABLET BY MOUTH ONCE A DAY   warfarin 5 MG tablet Commonly known as:  COUMADIN TAKE AS DIRECTED BY ANTICOAGULATION CLINIC

## 2016-07-19 NOTE — Patient Instructions (Signed)
Wear your green comfort insoles and see how things go over the next couple of weeks.   If you want to make some custom orthotics, tell the front office staff that you want to make a 45 min orthotic appointment with Dr. Lorelei Pont.

## 2016-07-20 ENCOUNTER — Other Ambulatory Visit: Payer: Self-pay | Admitting: *Deleted

## 2016-07-20 MED ORDER — ROSUVASTATIN CALCIUM 20 MG PO TABS: 20.0000 mg | ORAL_TABLET | Freq: Every day | ORAL | 3 refills | Status: DC

## 2016-07-25 ENCOUNTER — Ambulatory Visit (INDEPENDENT_AMBULATORY_CARE_PROVIDER_SITE_OTHER): Payer: Medicare HMO

## 2016-07-25 ENCOUNTER — Other Ambulatory Visit (INDEPENDENT_AMBULATORY_CARE_PROVIDER_SITE_OTHER): Payer: Medicare HMO

## 2016-07-25 DIAGNOSIS — I82409 Acute embolism and thrombosis of unspecified deep veins of unspecified lower extremity: Secondary | ICD-10-CM

## 2016-07-25 DIAGNOSIS — Z7901 Long term (current) use of anticoagulants: Secondary | ICD-10-CM

## 2016-07-25 DIAGNOSIS — I82402 Acute embolism and thrombosis of unspecified deep veins of left lower extremity: Secondary | ICD-10-CM

## 2016-07-25 DIAGNOSIS — Z5181 Encounter for therapeutic drug level monitoring: Secondary | ICD-10-CM

## 2016-07-25 LAB — POCT INR: INR: 5.6

## 2016-07-26 DIAGNOSIS — E1159 Type 2 diabetes mellitus with other circulatory complications: Secondary | ICD-10-CM | POA: Diagnosis not present

## 2016-07-26 NOTE — Patient Instructions (Signed)
Pre visit review using our clinic review tool, if applicable. No additional management support is needed unless otherwise documented below in the visit note. 

## 2016-07-28 ENCOUNTER — Ambulatory Visit: Payer: Medicare HMO

## 2016-07-28 ENCOUNTER — Ambulatory Visit (INDEPENDENT_AMBULATORY_CARE_PROVIDER_SITE_OTHER): Payer: Medicare HMO

## 2016-07-28 DIAGNOSIS — Z5181 Encounter for therapeutic drug level monitoring: Secondary | ICD-10-CM

## 2016-07-28 DIAGNOSIS — Z7901 Long term (current) use of anticoagulants: Secondary | ICD-10-CM

## 2016-07-28 LAB — POCT INR: INR: 1.4

## 2016-07-28 NOTE — Patient Instructions (Signed)
Pre visit review using our clinic review tool, if applicable. No additional management support is needed unless otherwise documented below in the visit note. 

## 2016-07-31 DIAGNOSIS — E113553 Type 2 diabetes mellitus with stable proliferative diabetic retinopathy, bilateral: Secondary | ICD-10-CM | POA: Diagnosis not present

## 2016-08-01 ENCOUNTER — Other Ambulatory Visit: Payer: Self-pay | Admitting: Internal Medicine

## 2016-08-01 ENCOUNTER — Other Ambulatory Visit: Payer: Self-pay | Admitting: *Deleted

## 2016-08-01 MED ORDER — FUROSEMIDE 20 MG PO TABS: ORAL_TABLET | ORAL | 3 refills | Status: DC

## 2016-08-07 ENCOUNTER — Ambulatory Visit (INDEPENDENT_AMBULATORY_CARE_PROVIDER_SITE_OTHER): Payer: Medicare HMO

## 2016-08-07 DIAGNOSIS — Z7901 Long term (current) use of anticoagulants: Secondary | ICD-10-CM

## 2016-08-07 DIAGNOSIS — Z5181 Encounter for therapeutic drug level monitoring: Secondary | ICD-10-CM

## 2016-08-07 LAB — POCT INR: INR: 1.9

## 2016-08-07 NOTE — Patient Instructions (Signed)
Pre visit review using our clinic review tool, if applicable. No additional management support is needed unless otherwise documented below in the visit note. 

## 2016-08-14 ENCOUNTER — Encounter: Payer: Self-pay | Admitting: Family Medicine

## 2016-08-14 ENCOUNTER — Ambulatory Visit (INDEPENDENT_AMBULATORY_CARE_PROVIDER_SITE_OTHER): Payer: Medicare HMO | Admitting: Family Medicine

## 2016-08-14 VITALS — BP 142/86 | HR 80 | Temp 97.3°F | Ht 68.5 in | Wt 260.0 lb

## 2016-08-14 DIAGNOSIS — Z89429 Acquired absence of other toe(s), unspecified side: Secondary | ICD-10-CM | POA: Diagnosis not present

## 2016-08-14 DIAGNOSIS — E1142 Type 2 diabetes mellitus with diabetic polyneuropathy: Secondary | ICD-10-CM | POA: Diagnosis not present

## 2016-08-14 DIAGNOSIS — I739 Peripheral vascular disease, unspecified: Secondary | ICD-10-CM

## 2016-08-14 DIAGNOSIS — S98139A Complete traumatic amputation of one unspecified lesser toe, initial encounter: Secondary | ICD-10-CM

## 2016-08-14 NOTE — Progress Notes (Signed)
Dr. Frederico Hamman T. Dilan Novosad, MD, [facility] [place], [address] Phone: [phone] Fax: [phone]  08/14/2016  Patient: Benjamin Davidson, MRN: [id number], DOB: May 31, 1950, 66 y.o.  Primary Physician:  Venia Carbon, MD   Chief Complaint  Patient presents with  . Foot Orthotics   Subjective:   Benjamin Davidson is a 66 y.o. very pleasant male patient who presents with the following:  Pleasant gentleman with diabetes and neuropathy who presents for custom orthotic fabrication. He also has peripheral vascular disease, and he has had toe amputations of his fourth and fifth toes on the right as well as the fifth toe on the left.  I gave him a sports insole about 1 month ago, and this is helped his walking in his shoes.  Past Medical History, Surgical History, Social History, Family History, Problem List, Medications, and Allergies have been reviewed and updated if relevant.  Patient Active Problem List   Diagnosis Date Noted  . Acute bronchitis 07/07/2016  . Preventative health care 12/17/2014  . Type 2 diabetes, controlled, with peripheral neuropathy ([place]) 12/17/2014  . Diabetic proliferative retinopathy ([place]) 12/17/2014  . Encounter for therapeutic drug monitoring 08/06/2014  . Peripheral vascular disease ([place])   . Left leg DVT ([place])   . MDD (recurrent major depressive disorder) in remission ([place])   . Long term current use of anticoagulant 04/16/2010  . Hyperlipidemia 06/10/2009  . CAD, ARTERY BYPASS GRAFT 06/10/2009  . VISUAL IMPAIRMENT 02/19/2008  . CKD stage 3 due to type 2 diabetes mellitus ([place]) 01/30/2008  . ALLERGIC RHINITIS 05/30/2007  . Toe amputation status ([place]) 12/31/2006  . Essential hypertension 09/25/2006    Past Medical History:  Diagnosis Date  . Anemia   . Asthma    childhood  . CHF (congestive heart failure) ([place])   . Coronary artery disease   . DM (diabetes mellitus), type 2,  uncontrolled, periph vascular complic ([place])    Type II  . Edema    feet/legs  . Hypertension   . Left leg DVT ([place])   . MDD (recurrent major depressive disorder) in remission ([place])   . Peripheral vascular disease (HCC)    Left toe amputations. Dr Lucky Cowboy  . Pure hypercholesterolemia   . Shortness of breath dyspnea   . Syncope    resolved    Past Surgical History:  Procedure Laterality Date  . CARDIAC CATHETERIZATION    . CATARACT EXTRACTION W/PHACO Left 06/03/2015   Procedure: CATARACT EXTRACTION PHACO AND INTRAOCULAR LENS PLACEMENT (IOC);  Surgeon: Eulogio Bear, MD;  Location: [facility] ORS;  Service: Ophthalmology;  Laterality: Left;  Lot# [id number] H [place]: 1.06 AP%: 5.5 CDE: 3.69  . CORONARY ARTERY BYPASS GRAFT    . EYE SURGERY    . PARS PLANA VITRECTOMY Right 02/17/2015   Procedure: PARS PLANA VITRECTOMY WITH 25 GAUGE, endolaser, right eye;  Surgeon: Milus Height, MD;  Location: [facility] ORS;  Service: Ophthalmology;  Laterality: Right;  fluid pack lot # [id number] H endolaser: 200w, [id number]  . PERCUTANEOUS STENT INTERVENTION     right leg  . PERIPHERALLY INSERTED CENTRAL CATHETER INSERTION  2013  . TOE AMPUTATION     left 5th, right 4th and 5th    Social History   Social History  . Marital status: Divorced    Spouse name: N/A  . Number of children: 0  . Years of education: N/A   Occupational History  . [employer]  Disabled since 2000's   Social History Main Topics  . Smoking status: Never Smoker  . Smokeless tobacco: Never Used  . Alcohol use No  . Drug use: No  . Sexual activity: Not on file   Other Topics Concern  . Not on file   Social History Narrative   No living will   Requests [name], sister, as health care POA. May have done official forms   Would accept resuscitation attempts   Not sure about tube feeds    Family History  Problem Relation Age of Onset  . Hyperlipidemia Brother     Allergies  Allergen Reactions  . Lisinopril     Cough Flu  like symptoms     Medication list reviewed and updated in full in [facility] [facility].   GEN: No acute illnesses, no fevers, chills. GI: No n/v/d, eating normally Pulm: No SOB Interactive and getting along well at home.  Otherwise, ROS is as per the HPI.  Objective:   BP (!) 142/86   Pulse 80   Temp (!) 97.3 F (36.3 C) (Oral)   Ht 5' 8.5" (1.74 m)   Wt 260 lb (117.9 kg)   BMI 38.96 kg/m   GEN: WDWN, NAD, Non-toxic, A & O x 3 HEENT: Atraumatic, Normocephalic. Neck supple. No masses, No LAD. Ears and Nose: No external deformity. CV: RRR, No M/G/R. No JVD. No thrill. No extra heart sounds. PULM: CTA B, no wheezes, crackles, rhonchi. No retractions. No resp. distress. No accessory muscle use. EXTR: No c/c/e NEURO Normal gait.  PSYCH: Normally interactive. Conversant. Not depressed or anxious appearing.  Calm demeanor.   Notable surgical altered appearance particularly on the right foot. It appears that he lacks 2 toes on the right foot as well as one on the left. Bony anatomy is nontender today and he has grossly normal range of motion and strength at the ankle.  Laboratory and Imaging Data:  Assessment and Plan:   Amputated toe, unspecified laterality (HCC)  Type 2 diabetes, controlled, with peripheral neuropathy ([place])  Peripheral vascular disease ([place])  >40 minutes spent in face to face time with patient, >50% spent in counselling or coordination of care: Additional time spent in gait analysis and alterations to orthotic in the fabrication process, additional time needed given special needs and lack of toes.  Patient was fitted for a standard, cushioned, semi-rigid orthotic.  The orthotic was heated and the patient stood on the orthotic blank positioned on the orthotic stand. The patient was positioned in subtalar neutral position and 10 degrees of ankle dorsiflexion in a weight bearing stance. After molding, a stable Fast-Tech EVA base was applied to the orthotic blank.    The blank was ground to a stable position for weight bearing. Size: 10 base: F4 gree posting: none additional orthotic padding: none   Follow-up: No Follow-up on file.  Future Appointments Date Time Provider [place]  08/24/2016 1:00 PM [facility] VASC 1 [facility] None  08/24/2016 2:00 PM [facility] V[facility] 1 [facility] None  08/24/2016 3:15 PM Schnier, Dolores Lory, MD [facility] None  08/29/2016 2:15 PM [facility]   Signed,  Frederico Hamman T. Ethen Bannan, MD   Allergies as of 08/14/2016      Reactions   Lisinopril    Cough Flu like symptoms      Medication List       Accurate as of 08/14/16  5:18 PM. Always use your most recent med list.  aspirin EC 325 MG tablet Take 325 mg by mouth daily.   carvedilol 6.25 MG tablet Commonly known as:  COREG Take 1 tablet (6.25 mg total) by mouth 2 (two) times daily.   furosemide 20 MG tablet Commonly known as:  LASIX TAKE 1 TABLET BY MOUTH ONCE A DAY ** MAYTAKE ONE ADDITIONAL TABLET IFINCREASED SWELLING   gabapentin 300 MG capsule Commonly known as:  NEURONTIN TAKE 1 CAPSULE BY MOUTH TWICE DAILY   Insulin Syringe-Needle U-100 31G X 5/16" 0.5 ML Misc Commonly known as:  PRODIGY INSULIN SYRINGE Inject 1 Syringe into the skin 2 (two) times daily.   losartan 25 MG tablet Commonly known as:  COZAAR Take 1 tablet (25 mg total) by mouth daily.   metFORMIN 500 MG tablet Commonly known as:  GLUCOPHAGE TAKE 1 TABLET BY MOUTH 2 TIMES DAILY WITH MEAL   NOVOFINE 31 31G X 6 MM Misc Generic drug:  Insulin Pen Needle by Does not apply route.   NOVOLIN 70/30 (70-30) 100 UNIT/ML injection Generic drug:  insulin NPH-regular Human INJECT 35 UNITS SUBCUTANEOUSLY TWICE A DAY AS DIRECTED   NOVOLIN R 100 units/mL injection Generic drug:  insulin regular as needed.   rosuvastatin 20 MG tablet Commonly known as:  CRESTOR Take 1 tablet (20 mg total) by mouth daily.   sertraline 100 MG tablet Commonly known as:   ZOLOFT TAKE 1 TABLET BY MOUTH ONCE A DAY   warfarin 5 MG tablet Commonly known as:  COUMADIN TAKE AS DIRECTED BY ANTICOAGULATION CLINIC

## 2016-08-17 ENCOUNTER — Other Ambulatory Visit: Payer: Self-pay | Admitting: Internal Medicine

## 2016-08-23 ENCOUNTER — Other Ambulatory Visit (INDEPENDENT_AMBULATORY_CARE_PROVIDER_SITE_OTHER): Payer: Self-pay | Admitting: Vascular Surgery

## 2016-08-23 DIAGNOSIS — I739 Peripheral vascular disease, unspecified: Secondary | ICD-10-CM

## 2016-08-24 ENCOUNTER — Ambulatory Visit (INDEPENDENT_AMBULATORY_CARE_PROVIDER_SITE_OTHER): Payer: Self-pay | Admitting: Vascular Surgery

## 2016-08-24 ENCOUNTER — Ambulatory Visit (INDEPENDENT_AMBULATORY_CARE_PROVIDER_SITE_OTHER): Payer: Medicare HMO

## 2016-08-24 ENCOUNTER — Encounter (INDEPENDENT_AMBULATORY_CARE_PROVIDER_SITE_OTHER): Payer: Self-pay

## 2016-08-24 DIAGNOSIS — I739 Peripheral vascular disease, unspecified: Secondary | ICD-10-CM

## 2016-08-25 ENCOUNTER — Encounter (INDEPENDENT_AMBULATORY_CARE_PROVIDER_SITE_OTHER): Payer: Self-pay | Admitting: Vascular Surgery

## 2016-08-29 ENCOUNTER — Ambulatory Visit (INDEPENDENT_AMBULATORY_CARE_PROVIDER_SITE_OTHER): Payer: Medicare HMO

## 2016-08-29 DIAGNOSIS — Z5181 Encounter for therapeutic drug level monitoring: Secondary | ICD-10-CM | POA: Diagnosis not present

## 2016-08-29 DIAGNOSIS — Z7901 Long term (current) use of anticoagulants: Secondary | ICD-10-CM

## 2016-08-29 LAB — POCT INR: INR: 2.2

## 2016-08-29 NOTE — Patient Instructions (Signed)
Pre visit review using our clinic review tool, if applicable. No additional management support is needed unless otherwise documented below in the visit note. 

## 2016-08-31 ENCOUNTER — Other Ambulatory Visit: Payer: Self-pay | Admitting: Internal Medicine

## 2016-09-01 NOTE — Telephone Encounter (Signed)
Patient is compliant with coumadin management. Will refill X 3 months. 

## 2016-09-26 ENCOUNTER — Ambulatory Visit (INDEPENDENT_AMBULATORY_CARE_PROVIDER_SITE_OTHER): Payer: Medicare HMO

## 2016-09-26 DIAGNOSIS — Z5181 Encounter for therapeutic drug level monitoring: Secondary | ICD-10-CM | POA: Diagnosis not present

## 2016-09-26 DIAGNOSIS — Z7901 Long term (current) use of anticoagulants: Secondary | ICD-10-CM

## 2016-09-26 LAB — POCT INR: INR: 3

## 2016-09-26 NOTE — Patient Instructions (Signed)
Pre visit review using our clinic review tool, if applicable. No additional management support is needed unless otherwise documented below in the visit note. 

## 2016-10-24 ENCOUNTER — Ambulatory Visit (INDEPENDENT_AMBULATORY_CARE_PROVIDER_SITE_OTHER): Payer: Medicare HMO

## 2016-10-24 ENCOUNTER — Ambulatory Visit: Payer: Medicare HMO

## 2016-10-24 DIAGNOSIS — Z5181 Encounter for therapeutic drug level monitoring: Secondary | ICD-10-CM

## 2016-10-24 DIAGNOSIS — Z7901 Long term (current) use of anticoagulants: Secondary | ICD-10-CM

## 2016-10-24 DIAGNOSIS — Z86718 Personal history of other venous thrombosis and embolism: Secondary | ICD-10-CM

## 2016-10-24 DIAGNOSIS — Z23 Encounter for immunization: Secondary | ICD-10-CM

## 2016-10-24 LAB — POCT INR: INR: 2.6

## 2016-10-24 NOTE — Patient Instructions (Signed)
Pre visit review using our clinic review tool, if applicable. No additional management support is needed unless otherwise documented below in the visit note. 

## 2016-10-31 ENCOUNTER — Other Ambulatory Visit: Payer: Self-pay | Admitting: Cardiovascular Disease

## 2016-11-01 ENCOUNTER — Emergency Department (HOSPITAL_COMMUNITY)
Admission: EM | Admit: 2016-11-01 | Discharge: 2016-11-01 | Disposition: A | Discharge status: Home / Self Care | Payer: Medicare HMO | Attending: Emergency Medicine | Admitting: Emergency Medicine

## 2016-11-01 ENCOUNTER — Encounter (HOSPITAL_COMMUNITY): Payer: Self-pay | Admitting: *Deleted

## 2016-11-01 ENCOUNTER — Emergency Department (HOSPITAL_COMMUNITY): Payer: Medicare HMO

## 2016-11-01 DIAGNOSIS — I130 Hypertensive heart and chronic kidney disease with heart failure and stage 1 through stage 4 chronic kidney disease, or unspecified chronic kidney disease: Secondary | ICD-10-CM | POA: Diagnosis not present

## 2016-11-01 DIAGNOSIS — N183 Chronic kidney disease, stage 3 (moderate): Secondary | ICD-10-CM | POA: Diagnosis not present

## 2016-11-01 DIAGNOSIS — Z951 Presence of aortocoronary bypass graft: Secondary | ICD-10-CM | POA: Insufficient documentation

## 2016-11-01 DIAGNOSIS — J45909 Unspecified asthma, uncomplicated: Secondary | ICD-10-CM | POA: Diagnosis not present

## 2016-11-01 DIAGNOSIS — Z794 Long term (current) use of insulin: Secondary | ICD-10-CM | POA: Insufficient documentation

## 2016-11-01 DIAGNOSIS — E1122 Type 2 diabetes mellitus with diabetic chronic kidney disease: Secondary | ICD-10-CM | POA: Insufficient documentation

## 2016-11-01 DIAGNOSIS — Z7901 Long term (current) use of anticoagulants: Secondary | ICD-10-CM | POA: Diagnosis not present

## 2016-11-01 DIAGNOSIS — Y929 Unspecified place or not applicable: Secondary | ICD-10-CM | POA: Insufficient documentation

## 2016-11-01 DIAGNOSIS — I2510 Atherosclerotic heart disease of native coronary artery without angina pectoris: Secondary | ICD-10-CM | POA: Diagnosis not present

## 2016-11-01 DIAGNOSIS — Z7982 Long term (current) use of aspirin: Secondary | ICD-10-CM | POA: Insufficient documentation

## 2016-11-01 DIAGNOSIS — Y999 Unspecified external cause status: Secondary | ICD-10-CM | POA: Insufficient documentation

## 2016-11-01 DIAGNOSIS — I509 Heart failure, unspecified: Secondary | ICD-10-CM | POA: Diagnosis not present

## 2016-11-01 DIAGNOSIS — S46912A Strain of unspecified muscle, fascia and tendon at shoulder and upper arm level, left arm, initial encounter: Secondary | ICD-10-CM

## 2016-11-01 DIAGNOSIS — Y939 Activity, unspecified: Secondary | ICD-10-CM | POA: Insufficient documentation

## 2016-11-01 DIAGNOSIS — E1159 Type 2 diabetes mellitus with other circulatory complications: Secondary | ICD-10-CM | POA: Diagnosis not present

## 2016-11-01 DIAGNOSIS — S4992XA Unspecified injury of left shoulder and upper arm, initial encounter: Secondary | ICD-10-CM | POA: Diagnosis not present

## 2016-11-01 DIAGNOSIS — M25512 Pain in left shoulder: Secondary | ICD-10-CM | POA: Diagnosis not present

## 2016-11-01 DIAGNOSIS — T148XXA Other injury of unspecified body region, initial encounter: Secondary | ICD-10-CM | POA: Diagnosis not present

## 2016-11-01 MED ORDER — CYCLOBENZAPRINE HCL 5 MG PO TABS: 5.0000 mg | ORAL_TABLET | Freq: Two times a day (BID) | ORAL | 0 refills | Status: DC | PRN

## 2016-11-01 NOTE — Discharge Instructions (Signed)
Do not drive while taking the muscle relaxer as it can make you sleepy.
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
## 2016-11-07 ENCOUNTER — Telehealth: Payer: Self-pay
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
## 2016-11-13 ENCOUNTER — Ambulatory Visit (INDEPENDENT_AMBULATORY_CARE_PROVIDER_SITE_OTHER): Payer: Medicare HMO | Admitting: Family Medicine

## 2016-11-13 ENCOUNTER — Encounter: Payer: Self-pay | Admitting: Family Medicine

## 2016-11-13 DIAGNOSIS — J069 Acute upper respiratory infection, unspecified: Secondary | ICD-10-CM

## 2016-11-13 MED ORDER — BENZONATATE 200 MG PO CAPS: 200.0000 mg | ORAL_CAPSULE | Freq: Three times a day (TID) | ORAL | 0 refills | Status: DC | PRN

## 2016-11-13 NOTE — Patient Instructions (Addendum)
Rest and fluids.  Take tessalon if needed for cough.  Take care.  Glad to see you.  If you have more right ear pain then let us know.

## 2016-11-13 NOTE — Progress Notes (Signed)
On coumadin at baseline.  Started with sx about 5 days ago.  He attributed it to allergies initially.  Had cough, sneezing, some eye discharge.  Fatigued.  He felt better yesterday after a nap.  Back was sore from coughing recently "and it felt likely I had pleurisy with a pretty good episode of coughing."  He feels better today, compared to the last few days, "a lot" better.  No fevers known.    Meds, vitals, and allergies reviewed.   ROS: Per HPI unless specifically indicated in ROS section   GEN: nad, alert and oriented HEENT: mucous membranes moist, L tm w/o erythema, R tm with mild/minimal irritation but not bulging, nasal exam w/o erythema, clear discharge noted,  OP with cobblestoning, sinuses not ttp x4 NECK: supple w/o LA CV: rrr.   PULM: ctab, no inc wob EXT: no edema SKIN: no acute rash

## 2016-11-14 DIAGNOSIS — J069 Acute upper respiratory infection, unspecified: Secondary | ICD-10-CM | POA: Insufficient documentation

## 2016-11-14 NOTE — Assessment & Plan Note (Signed)
Likely viral, better today.  Rest and fluids.  Take tessalon if needed for cough.  If more right ear pain then he'll let us know.  Wouldn't treat with abx given the minimal irritation on the R TM at this point.

## 2016-11-21 ENCOUNTER — Ambulatory Visit (INDEPENDENT_AMBULATORY_CARE_PROVIDER_SITE_OTHER): Payer: Medicare HMO

## 2016-11-21 DIAGNOSIS — Z7901 Long term (current) use of anticoagulants: Secondary | ICD-10-CM | POA: Diagnosis not present

## 2016-11-21 DIAGNOSIS — Z5181 Encounter for therapeutic drug level monitoring: Secondary | ICD-10-CM

## 2016-11-21 LAB — POCT INR: INR: 2.2

## 2016-11-21 NOTE — Patient Instructions (Signed)
Pre visit review using our clinic review tool, if applicable. No additional management support is needed unless otherwise documented below in the visit note. 

## 2016-11-24 ENCOUNTER — Telehealth: Payer: Self-pay | Admitting: *Deleted

## 2016-11-24 NOTE — Telephone Encounter (Signed)
PA for Benzonatate, form placed in Dr. Josefine Class In [place].

## 2016-11-25 NOTE — Telephone Encounter (Signed)
I'll work on the hard copy.  Thanks.  

## 2016-11-27 NOTE — Telephone Encounter (Signed)
Form faxed, awaiting response.

## 2016-11-28 NOTE — Telephone Encounter (Signed)
PA for Benzonatate denied by insurance.  Left detailed message on voicemail of patient.

## 2016-12-06 ENCOUNTER — Ambulatory Visit: Payer: Medicare HMO | Admitting: Internal Medicine

## 2016-12-11 ENCOUNTER — Other Ambulatory Visit: Payer: Self-pay | Admitting: Internal Medicine

## 2016-12-19 ENCOUNTER — Telehealth: Payer: Self-pay

## 2016-12-19 ENCOUNTER — Ambulatory Visit (INDEPENDENT_AMBULATORY_CARE_PROVIDER_SITE_OTHER): Payer: Medicare HMO

## 2016-12-19 DIAGNOSIS — Z5181 Encounter for therapeutic drug level monitoring: Secondary | ICD-10-CM

## 2016-12-19 DIAGNOSIS — Z7901 Long term (current) use of anticoagulants: Secondary | ICD-10-CM

## 2016-12-19 LAB — POCT INR: INR: 2.3

## 2016-12-19 NOTE — Patient Instructions (Addendum)
INR today 2.5  Continue to take 1 pill (5mg ) daily EXCEPT for 1/2 pill (2.5mg ) on Mondays and Fridays.  Recheck in 6 weeks.

## 2016-12-19 NOTE — Telephone Encounter (Signed)
Patient is aware (and okay) that he may not receive a response until next week as MD is out of the office.  While in for INR check today, patient is asking when he should f/u with Dr. Silvio Pate again.  Currently, I don't see anything on the books and last routine blood work and annual in May of this year.  Appears he may be due for blood work at least but will defer to MD for recommendations.  Overall, patient reports he is doing well but highly suspects poor blood sugar control.  Please advise.  Thanks.

## 2016-12-20 NOTE — Telephone Encounter (Signed)
He is due for a follow up of his diabetes now----please set up an appt in the next few weeks. Looks like it should be set up as physical if possible

## 2017-01-03 ENCOUNTER — Telehealth: Payer: Self-pay

## 2017-01-03 NOTE — Telephone Encounter (Signed)
LM for patient to call back and schedule a CPE/dm follow up.  Appointment is needed in the next few weeks.    Thanks.

## 2017-01-03 NOTE — Telephone Encounter (Signed)
LM for patient to call back and schedule his appointment.  Also created a CRM for [facility] to please schedule pt for a CPE/ dm f/u with Dr. Silvio Pate by the end of the year.

## 2017-01-12 ENCOUNTER — Other Ambulatory Visit: Payer: Self-pay | Admitting: Cardiovascular Disease

## 2017-01-17 NOTE — Telephone Encounter (Signed)
Spoke with patient today and made appointments for 02/06/17 to see Dr. Silvio Pate and f/u with coumadin clinic.

## 2017-02-02 ENCOUNTER — Telehealth: Payer: Self-pay

## 2017-02-02 ENCOUNTER — Ambulatory Visit: Payer: Medicare HMO

## 2017-02-02 NOTE — Telephone Encounter (Signed)
Copied from [place] [id number]. Topic: Inquiry >> Feb 02, 2017  3:59 PM Oliver Pila B wrote: Reason for CRM: pt called and asked to speak w/ Leafy Ro, pt did not state reason for call but wanted to speak w/ her

## 2017-02-02 NOTE — Telephone Encounter (Signed)
Spoke with patient about rescheduling his appointment next week.  He will be seeing Dr. Silvio Pate as well on Tuesday so we will keep coumadin clinic appointment at the same time to save patient a trip.  Patient okay with plan.

## 2017-02-06 ENCOUNTER — Ambulatory Visit (INDEPENDENT_AMBULATORY_CARE_PROVIDER_SITE_OTHER): Payer: Medicare HMO | Admitting: Internal Medicine

## 2017-02-06 ENCOUNTER — Ambulatory Visit (INDEPENDENT_AMBULATORY_CARE_PROVIDER_SITE_OTHER): Payer: Medicare HMO

## 2017-02-06 ENCOUNTER — Encounter: Payer: Self-pay | Admitting: Internal Medicine

## 2017-02-06 VITALS — BP 118/82 | HR 75 | Temp 97.6°F | Wt 259.0 lb

## 2017-02-06 DIAGNOSIS — Z7901 Long term (current) use of anticoagulants: Secondary | ICD-10-CM

## 2017-02-06 DIAGNOSIS — N183 Chronic kidney disease, stage 3 unspecified: Secondary | ICD-10-CM

## 2017-02-06 DIAGNOSIS — I82502 Chronic embolism and thrombosis of unspecified deep veins of left lower extremity: Secondary | ICD-10-CM

## 2017-02-06 DIAGNOSIS — Z5181 Encounter for therapeutic drug level monitoring: Secondary | ICD-10-CM

## 2017-02-06 DIAGNOSIS — E1122 Type 2 diabetes mellitus with diabetic chronic kidney disease: Secondary | ICD-10-CM

## 2017-02-06 DIAGNOSIS — I739 Peripheral vascular disease, unspecified: Secondary | ICD-10-CM

## 2017-02-06 DIAGNOSIS — E1142 Type 2 diabetes mellitus with diabetic polyneuropathy: Secondary | ICD-10-CM

## 2017-02-06 DIAGNOSIS — S98139A Complete traumatic amputation of one unspecified lesser toe, initial encounter: Secondary | ICD-10-CM

## 2017-02-06 LAB — CBC
HCT: 38.4 % — ABNORMAL LOW (ref 39.0–52.0)
Hemoglobin: 12.4 g/dL — ABNORMAL LOW (ref 13.0–17.0)
MCHC: 32.2 g/dL (ref 30.0–36.0)
MCV: 83.8 fl (ref 78.0–100.0)
Platelets: 249 10*3/uL (ref 150.0–400.0)
RBC: 4.58 Mil/uL (ref 4.22–5.81)
RDW: 15.3 % (ref 11.5–15.5)
WBC: 6.4 10*3/uL (ref 4.0–10.5)

## 2017-02-06 LAB — HEMOGLOBIN A1C: Hgb A1c MFr Bld: 7.4 % — ABNORMAL HIGH (ref 4.6–6.5)

## 2017-02-06 LAB — COMPREHENSIVE METABOLIC PANEL
ALT: 27 U/L (ref 0–53)
AST: 31 U/L (ref 0–37)
Albumin: 4 g/dL (ref 3.5–5.2)
Alkaline Phosphatase: 50 U/L (ref 39–117)
BUN: 16 mg/dL (ref 6–23)
CO2: 29 mEq/L (ref 19–32)
Calcium: 9.2 mg/dL (ref 8.4–10.5)
Chloride: 103 mEq/L (ref 96–112)
Creatinine, Ser: 0.94 mg/dL (ref 0.40–1.50)
GFR: 85.58 mL/min (ref >60.00)
Glucose, Bld: 141 mg/dL — ABNORMAL HIGH (ref 70–99)
Potassium: 4.3 mEq/L (ref 3.5–5.1)
Sodium: 139 mEq/L (ref 135–145)
Total Bilirubin: 0.3 mg/dL (ref 0.2–1.2)
Total Protein: 6.6 g/dL (ref 6.0–8.3)

## 2017-02-06 LAB — LIPID PANEL
Cholesterol: 112 mg/dL (ref 0–200)
HDL: 34.4 mg/dL — ABNORMAL LOW (ref >39.00)
LDL Cholesterol: 60 mg/dL (ref 0–99)
NonHDL: 77.23
Total CHOL/HDL Ratio: 3
Triglycerides: 88 mg/dL (ref 0.0–149.0)
VLDL: 17.6 mg/dL (ref 0.0–40.0)

## 2017-02-06 LAB — HM DIABETES FOOT EXAM

## 2017-02-06 LAB — POCT INR: INR: 2.9

## 2017-02-06 MED ORDER — GABAPENTIN 300 MG PO CAPS: 300.0000 mg | ORAL_CAPSULE | Freq: Two times a day (BID) | ORAL | 11 refills | Status: DC

## 2017-02-06 NOTE — Assessment & Plan Note (Signed)
No symptoms since procedure Yearly follow up with Dr Ronalee Belts

## 2017-02-06 NOTE — Patient Instructions (Signed)
INR today 2.9.  Continue to take 1 pill (5mg ) daily EXCEPT for 1/2 pill (2.5mg ) on Mondays and Fridays.  Recheck in 6 weeks.

## 2017-02-06 NOTE — Assessment & Plan Note (Signed)
Hopefully still acceptable control May need to adjust regular dosing or increase AM 70/30

## 2017-02-06 NOTE — Assessment & Plan Note (Signed)
On losartan

## 2017-02-06 NOTE — Patient Instructions (Signed)
Please do the fecal test kit.

## 2017-02-06 NOTE — Progress Notes (Signed)
Subjective:    Patient ID: Benjamin Davidson, male    DOB: 1950-09-12, 67 y.o.   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 aspirin EC 325 MG EC tablet Take 325 mg by mouth daily.      . carvedilol (COREG) 6.25 MG tablet TAKE 1 TABLET BY MOUTH TWICE (2) DAILY 180 tablet 0  . cyclobenzaprine (FLEXERIL) 5 MG tablet Take 1 tablet (5 mg total) by mouth 2 (two) times daily as needed for muscle spasms. 20 tablet 0  . furosemide (LASIX) 20 MG tablet TAKE 1 TABLET BY MOUTH ONCE A DAY ** MAYTAKE ONE ADDITIONAL TABLET IFINCREASED SWELLING 60 tablet 3  . gabapentin (NEURONTIN) 300 MG capsule TAKE 1 CAPSULE BY MOUTH TWICE DAILY 60 capsule 11  . Insulin Pen Needle (NOVOFINE 31) 31G X 6 MM MISC by Does not apply route.      . Insulin Syringe-Needle U-100 (PRODIGY INSULIN SYRINGE) 31G X 5/16" 0.5 ML MISC Inject 1 Syringe into the skin 2 (two) times daily. 100 each 7  . losartan (COZAAR) 25 MG tablet TAKE 1 TABLET BY MOUTH ONCE DAILY 30 tablet 0  . metFORMIN (GLUCOPHAGE) 500 MG tablet TAKE 1 TABLET BY MOUTH 2 TIMES DAILY WITH A MEAL 60 tablet 11  . NOVOLIN 70/30 (70-30) 100 UNIT/ML injection INJECT 35 UNITS SUBCUTANEOUSLY TWICE A DAY AS  DIRECTED 20 mL 11  . NOVOLIN R 100 UNIT/ML injection as needed.     . rosuvastatin (CRESTOR) 20 MG tablet Take 1 tablet (20 mg total) by mouth daily. 90 tablet 3  . sertraline (ZOLOFT) 100 MG tablet TAKE 1 TABLET BY MOUTH ONCE A DAY 90 tablet 3  . warfarin (COUMADIN) 5 MG tablet TAKE AS DIRECTED BY ANTICOAGULATION CLINIC 100 tablet 0   No current facility-administered medications on file prior to visit.     Allergies  Allergen Reactions  . Lisinopril     Cough Flu like symptoms     Past Medical History:  Diagnosis Date  . Anemia   . Asthma    childhood  . CHF (congestive heart failure) ([place])   . Coronary artery disease   . DM (diabetes mellitus), type 2, uncontrolled, periph vascular complic ([place])    Type II  . Edema    feet/legs  . Hypertension   . Left leg DVT ([place])   . MDD (recurrent major depressive disorder) in remission ([place])   . Peripheral vascular disease (HCC)    Left toe amputations. Dr Lucky Cowboy  . Pure hypercholesterolemia   . Shortness of breath dyspnea   . Syncope    resolved    Past Surgical History:  Procedure Laterality Date  . CARDIAC CATHETERIZATION    .  CATARACT EXTRACTION W/PHACO Left 06/03/2015   Procedure: CATARACT EXTRACTION PHACO AND INTRAOCULAR LENS PLACEMENT (IOC);  Surgeon: Eulogio Bear, MD;  Location: [facility] ORS;  Service: Ophthalmology;  Laterality: Left;  Lot# [id number] H [place]: 1.06 AP%: 5.5 CDE: 3.69  . CORONARY ARTERY BYPASS GRAFT    . EYE SURGERY    . PARS PLANA VITRECTOMY Right 02/17/2015   Procedure: PARS PLANA VITRECTOMY WITH 25 GAUGE, endolaser, right eye;  Surgeon: Milus Height, MD;  Location: [facility] ORS;  Service: Ophthalmology;  Laterality: Right;  fluid pack lot # [id number] H endolaser: 200w, [id number]  . PERCUTANEOUS STENT INTERVENTION     right leg  . PERIPHERALLY INSERTED CENTRAL CATHETER INSERTION  2013  . TOE AMPUTATION     left 5th, right 4th and 5th    Family History  Problem Relation Age of Onset  . Hyperlipidemia Brother      Social History   Socioeconomic History  . Marital status: Divorced    Spouse name: Not on file  . Number of children: 0  . Years of education: Not on file  . Highest education level: Not on file  Social Needs  . Financial resource strain: Not on file  . Food insecurity - worry: Not on file  . Food insecurity - inability: Not on file  . Transportation needs - medical: Not on file  . Transportation needs - non-medical: Not on file  Occupational History  . Occupation: [name]    Comment: Disabled since 2000's  Tobacco Use  . Smoking status: Never Smoker  . Smokeless tobacco: Never Used  Substance and Sexual Activity  . Alcohol use: No    Alcohol/week: 0.0 oz  . Drug use: No  . Sexual activity: Not on file  Other Topics Concern  . Not on file  Social History Narrative   No living will   Requests [name], sister, as health care POA. May have done official forms   Would accept resuscitation attempts   Not sure about tube feeds   Review of Systems Sleeps okay Appetite is okay Weight stable    Objective:   Physical Exam  Constitutional: He appears well-developed. No distress.  HENT:  Mouth/Throat: Oropharynx is clear and moist. No oropharyngeal exudate.  Neck: No thyromegaly present.  Cardiovascular: Normal rate, regular rhythm and normal heart sounds. Exam reveals no gallop.  No murmur heard. Faint pedal pulses  Pulmonary/Chest: Effort normal and breath sounds normal. No respiratory distress. He has no wheezes. He has no rales.  Musculoskeletal: He exhibits no edema.  No calf tenderness  Lymphadenopathy:    He has no cervical adenopathy.  Neurological:  Decreased sensation in feet  Skin:  No foot lesions Amputation sites clean/dry right 4th and 5th, left 5th Scabbed ulcer on top of left 4th toe (from inserts)          Assessment & Plan:

## 2017-02-06 NOTE — Assessment & Plan Note (Signed)
Sites are clean

## 2017-02-06 NOTE — Assessment & Plan Note (Signed)
Recurrent so ongoing warfarin Rx

## 2017-02-08 ENCOUNTER — Telehealth: Payer: Self-pay | Admitting: Internal Medicine

## 2017-02-08 NOTE — Telephone Encounter (Signed)
I have faxed the form

## 2017-02-08 NOTE — Telephone Encounter (Signed)
Copied from [place] [id number]. Topic: Quick Communication - See Telephone Encounter >> Feb 08, 2017 10:15 AM Boyd Kerbs wrote: CRM for notification. See Telephone encounter for:  Dr. Annie Main [facility] [facility] [phone] fax# [phone] fax over the diabetics form to be corrected for questions 4 & 5. This needs to be initialed and dated and she will re-fax another form.    02/08/17.

## 2017-02-08 NOTE — Telephone Encounter (Signed)
Please be on the look out for this form

## 2017-02-08 NOTE — Telephone Encounter (Signed)
Form is on your desk.

## 2017-02-14 DIAGNOSIS — E1142 Type 2 diabetes mellitus with diabetic polyneuropathy: Secondary | ICD-10-CM | POA: Diagnosis not present

## 2017-02-15 ENCOUNTER — Other Ambulatory Visit: Payer: Self-pay | Admitting: Cardiovascular Disease

## 2017-02-16 ENCOUNTER — Other Ambulatory Visit: Payer: Self-pay

## 2017-02-16 MED ORDER — LOSARTAN POTASSIUM 25 MG PO TABS: 25.0000 mg | ORAL_TABLET | Freq: Every day | ORAL | 0 refills | Status: DC

## 2017-02-22 ENCOUNTER — Other Ambulatory Visit: Payer: Self-pay | Admitting: Internal Medicine

## 2017-02-28 DIAGNOSIS — E113553 Type 2 diabetes mellitus with stable proliferative diabetic retinopathy, bilateral: Secondary | ICD-10-CM | POA: Diagnosis not present

## 2017-02-28 LAB — HM DIABETES EYE EXAM

## 2017-03-07 ENCOUNTER — Ambulatory Visit: Payer: Self-pay | Admitting: *Deleted

## 2017-03-07 NOTE — Telephone Encounter (Signed)
I spoke with Benjamin Davidson; for one week Benjamin Davidson has felt fatigued and has weakness to the point of holding to wall when walking; No other symptoms; no CP,SOB,H/A or dizziness and no weakness on either side of body, just generalized weakness; offered Benjamin Davidson appt for today but Benjamin Davidson only wants to see Dr Silvio Pate; Dr Silvio Pate will add Benjamin Davidson on 03/08/17 at 1:45 pm Junie Panning will have someone to bring Benjamin Davidson back); If Benjamin Davidson condition were to change or worsen prior to appt Benjamin Davidson will cb or go to UC.FYI to Dr Silvio Pate.

## 2017-03-07 NOTE — Telephone Encounter (Signed)
Will see tomorrow as planned

## 2017-03-07 NOTE — Telephone Encounter (Addendum)
Pt called because he is still having an issue with his immune system, and he doesn't have the energy to do anything; he states that he has to "hold onto the door when he goes to the bathroom"; pt seen in office 02/06/17; per nurse triage recommendation made for pt to see physician within 24 hours; pt would like for Dr Silvio Pate to call something in; intiiated conference call with Rena at [facility] and she explained to the pt that he does need to be seen in the office; Mearl Latin also explains that Dr Silvio Pate does not have availability in accordance with triage recommendations and offers the pt an appointment with Dr Silvio Pate at 1345 03/08/17; pt verbalizes understanding.      Reason for Disposition . [1] MODERATE weakness (i.e., interferes with work, school, normal activities) AND [2] persists > 3 days  Answer Assessment - Initial Assessment Questions 1. DESCRIPTION: "Describe how you are feeling."     No energy 2. SEVERITY: "How bad is it?"  "Can you stand and walk?"   - MILD - Feels weak or tired, but does not interfere with work, school or normal activities   - [place] to stand and walk; weakness interferes with work, school, or normal activities   - SEVERE - Unable to stand or walk     moderate 3. ONSET:  "When did the weakness begin?"     Seen in office in January 2018 4. CAUSE: "What do you think is causing the weakness?"     Not sure 5. MEDICINES: "Have you recently started a new medicine or had a change in the amount of a medicine?"     no 6. OTHER SYMPTOMS: "Do you have any other symptoms?" (e.g., chest pain, fever, cough, SOB, vomiting, diarrhea, bleeding)     no 7. PREGNANCY: "Is there any chance you are pregnant?" "When was your last menstrual period?"    n/a  Protocols used: WEAKNESS (GENERALIZED) AND FATIGUE-A-AH

## 2017-03-08 ENCOUNTER — Ambulatory Visit (INDEPENDENT_AMBULATORY_CARE_PROVIDER_SITE_OTHER)
Admission: RE | Admit: 2017-03-08 | Discharge: 2017-03-08 | Disposition: A | Discharge status: Home / Self Care | Payer: Medicare HMO | Source: Ambulatory Visit | Attending: Internal Medicine | Admitting: Internal Medicine

## 2017-03-08 ENCOUNTER — Ambulatory Visit (INDEPENDENT_AMBULATORY_CARE_PROVIDER_SITE_OTHER): Payer: Medicare HMO | Admitting: Internal Medicine

## 2017-03-08 ENCOUNTER — Encounter: Payer: Self-pay | Admitting: Internal Medicine

## 2017-03-08 VITALS — BP 144/68 | HR 81 | Temp 97.5°F | Wt 251.5 lb

## 2017-03-08 DIAGNOSIS — I82502 Chronic embolism and thrombosis of unspecified deep veins of left lower extremity: Secondary | ICD-10-CM | POA: Diagnosis not present

## 2017-03-08 DIAGNOSIS — L03119 Cellulitis of unspecified part of limb: Secondary | ICD-10-CM | POA: Diagnosis not present

## 2017-03-08 DIAGNOSIS — L02619 Cutaneous abscess of unspecified foot: Secondary | ICD-10-CM

## 2017-03-08 DIAGNOSIS — L97529 Non-pressure chronic ulcer of other part of left foot with unspecified severity: Secondary | ICD-10-CM | POA: Insufficient documentation

## 2017-03-08 DIAGNOSIS — L97523 Non-pressure chronic ulcer of other part of left foot with necrosis of muscle: Secondary | ICD-10-CM | POA: Diagnosis not present

## 2017-03-08 DIAGNOSIS — L97509 Non-pressure chronic ulcer of other part of unspecified foot with unspecified severity: Secondary | ICD-10-CM | POA: Diagnosis not present

## 2017-03-08 MED ORDER — LEVOFLOXACIN 500 MG PO TABS: 500.0000 mg | ORAL_TABLET | Freq: Every day | ORAL | 0 refills | Status: DC

## 2017-03-08 MED ORDER — CEFTRIAXONE SODIUM 1 G IJ SOLR
1.0000 g | Freq: Once | INTRAMUSCULAR | Status: AC
  Administered 2017-03-08: 1 g via INTRAMUSCULAR

## 2017-03-08 MED ORDER — AMOXICILLIN-POT CLAVULANATE 875-125 MG PO TABS: 1.0000 | ORAL_TABLET | Freq: Two times a day (BID) | ORAL | 0 refills | Status: DC

## 2017-03-08 NOTE — Assessment & Plan Note (Addendum)
Will check protime next week with recheck (on the levaquin) Instructed on decreasing dose while on levaquin

## 2017-03-08 NOTE — Progress Notes (Signed)
Subjective:    Patient ID: Benjamin Davidson, male    DOB: [birth year], 67 y.o.   MRN: [id number]  HPI Has been "poorly" over the past week "I don't have any energy, I don't feel like doing something" Not sick--no fever, cough, etc  No new problems other than sister's death about a month ago This was unexpected Couldn't go to funeral--in [place]  Some trouble eating Appetite is gone Still taking the sertraline Not crying  Still concerned about his foot Doesn't seem to be healing right--draining Not really hurting  Current Outpatient Medications on File Prior to Visit  Medication Sig Dispense Refill  . aspirin EC 325 MG EC tablet Take 325 mg by mouth daily.      . carvedilol (COREG) 6.25 MG tablet TAKE 1 TABLET BY MOUTH TWICE (2) DAILY 180 tablet 0  . cyclobenzaprine (FLEXERIL) 5 MG tablet Take 1 tablet (5 mg total) by mouth 2 (two) times daily as needed for muscle spasms. 20 tablet 0  . furosemide (LASIX) 20 MG tablet TAKE 1 TABLET BY MOUTH ONCE A DAY ** MAYTAKE ONE ADDITIONAL TABLET IFINCREASED SWELLING 60 tablet 3  . gabapentin (NEURONTIN) 300 MG capsule Take 1 capsule (300 mg total) by mouth 2 (two) times daily. 60 capsule 11  . Insulin Pen Needle (NOVOFINE 31) 31G X 6 MM MISC by Does not apply route.      . Insulin Syringe-Needle U-100 (PRODIGY INSULIN SYRINGE) 31G X 5/16" 0.5 ML MISC Inject 1 Syringe into the skin 2 (two) times daily. 100 each 7  . losartan (COZAAR) 25 MG tablet Take 1 tablet (25 mg total) by mouth daily. 30 tablet 0  . NOVOLIN 70/30 (70-30) 100 UNIT/ML injection INJECT 35 UNITS SUBCUTANEOUSLY TWICE A DAY AS DIRECTED 20 mL 11  . NOVOLIN R 100 UNIT/ML injection as needed.     . rosuvastatin (CRESTOR) 20 MG tablet Take 1 tablet (20 mg total) by mouth daily. 90 tablet 3  . sertraline (ZOLOFT) 100 MG tablet TAKE 1 TABLET BY MOUTH ONCE DAILY 90 tablet 3  . warfarin (COUMADIN) 5 MG tablet TAKE AS DIRECTED BY ANTICOAGULATION CLINIC 100 tablet 0   No current  facility-administered medications on file prior to visit.     Allergies  Allergen Reactions  . Lisinopril     Cough Flu like symptoms     Past Medical History:  Diagnosis Date  . Anemia   . Asthma    childhood  . CHF (congestive heart failure) ([place])   . Coronary artery disease   . DM (diabetes mellitus), type 2, uncontrolled, periph vascular complic ([place])    Type II  . Edema    feet/legs  . Hypertension   . Left leg DVT ([place])   . MDD (recurrent major depressive disorder) in remission ([place])   . Peripheral vascular disease (HCC)    Left toe amputations. Dr Lucky Cowboy  . Pure hypercholesterolemia   . Shortness of breath dyspnea   . Syncope    resolved    Past Surgical History:  Procedure Laterality Date  . CARDIAC CATHETERIZATION    . CATARACT EXTRACTION W/PHACO Left 06/03/2015   Procedure: CATARACT EXTRACTION PHACO AND INTRAOCULAR LENS PLACEMENT (IOC);  Surgeon: Eulogio Bear, MD;  Location: [facility];  Service: Ophthalmology;  Laterality: Left;  Lot# [id number] H [place]: 1.06 AP%: 5.5 CDE: 3.69  . CORONARY ARTERY BYPASS GRAFT    . EYE SURGERY    . PARS PLANA VITRECTOMY Right 02/17/2015   Procedure: PARS PLANA  VITRECTOMY WITH 25 GAUGE, endolaser, right eye;  Surgeon: Milus Height, MD;  Location: [facility];  Service: Ophthalmology;  Laterality: Right;  fluid pack lot # [id number] H endolaser: 200w, [id number]  . PERCUTANEOUS STENT INTERVENTION     right leg  . PERIPHERALLY INSERTED CENTRAL CATHETER INSERTION  2013  . TOE AMPUTATION     left 5th, right 4th and 5th    Family History  Problem Relation Age of Onset  . Heart disease Sister   . Hyperlipidemia Brother     Social History   Socioeconomic History  . Marital status: Divorced    Spouse name: Not on file  . Number of children: 0  . Years of education: Not on file  . Highest education level: Not on file  Social Needs  . Financial resource strain: Not on file  . Food insecurity - worry: Not on file  . Food insecurity -  inability: Not on file  . Transportation needs - medical: Not on file  . Transportation needs - non-medical: Not on file  Occupational History  . Occupation: [name]    Comment: Disabled since 2000's  Tobacco Use  . Smoking status: Never Smoker  . Smokeless tobacco: Never Used  Substance and Sexual Activity  . Alcohol use: No    Alcohol/week: 0.0 oz  . Drug use: No  . Sexual activity: Not on file  Other Topics Concern  . Not on file  Social History Narrative   No living will   Requests [name], sister, as health care POA. May have done official forms   Would accept resuscitation attempts   Not sure about tube feeds   Review of Systems No joint or other pain Generally sleeping okay--not really sedated in day    Objective:   Physical Exam  Constitutional: No distress.  Skin:  ~4cm diameter ulcer along lateral 1st MTP on left Marked inflammation and redness Necrotic tissue on top Redness extends past the 2nd MTP           Assessment & Plan:

## 2017-03-08 NOTE — Addendum Note (Signed)
Addended by: Viviana Simpler I on: 03/08/2017 02:35 PM   Modules accepted: Orders

## 2017-03-08 NOTE — Patient Instructions (Signed)
Please decrease the coumadin to 1/2 tab daily for now (while on this antibiotic). We will recheck your protime at your visit on Tuesday. Please go to the ER if you worsen before the appointment next week.

## 2017-03-08 NOTE — Assessment & Plan Note (Signed)
Infected ulcer Culture taken Will give rocephin--then home on levaquin and augmentin X-ray---make sure no bone involvement evident at this point Recheck early next week

## 2017-03-08 NOTE — Assessment & Plan Note (Signed)
Wound probed--culture done Discussed debridement and pros/cons---verbal consent  Sterile pickups and scissors----necrotic tissue removed from the entire top of the wound (~4cm squared) Muscle involvement Tolerated well Covered with telfa/coban Discussed home care May need wound clinic depending on response to antibiotics

## 2017-03-08 NOTE — Addendum Note (Signed)
Addended by: Pilar Grammes on: 03/08/2017 03:15 PM   Modules accepted: Orders

## 2017-03-11 LAB — WOUND CULTURE
MICRO NUMBER: 90166955
SPECIMEN QUALITY:: ADEQUATE

## 2017-03-13 ENCOUNTER — Encounter: Payer: Self-pay | Admitting: Internal Medicine

## 2017-03-13 ENCOUNTER — Ambulatory Visit (INDEPENDENT_AMBULATORY_CARE_PROVIDER_SITE_OTHER): Payer: Medicare HMO

## 2017-03-13 ENCOUNTER — Ambulatory Visit: Payer: Medicare HMO | Admitting: Cardiovascular Disease

## 2017-03-13 ENCOUNTER — Ambulatory Visit (INDEPENDENT_AMBULATORY_CARE_PROVIDER_SITE_OTHER): Payer: Medicare HMO | Admitting: Internal Medicine

## 2017-03-13 VITALS — BP 152/88 | HR 90 | Temp 98.1°F | Wt 252.5 lb

## 2017-03-13 DIAGNOSIS — I739 Peripheral vascular disease, unspecified: Secondary | ICD-10-CM | POA: Diagnosis not present

## 2017-03-13 DIAGNOSIS — L97523 Non-pressure chronic ulcer of other part of left foot with necrosis of muscle: Secondary | ICD-10-CM | POA: Diagnosis not present

## 2017-03-13 DIAGNOSIS — Z7901 Long term (current) use of anticoagulants: Secondary | ICD-10-CM | POA: Diagnosis not present

## 2017-03-13 DIAGNOSIS — L03119 Cellulitis of unspecified part of limb: Secondary | ICD-10-CM | POA: Diagnosis not present

## 2017-03-13 DIAGNOSIS — L02619 Cutaneous abscess of unspecified foot: Secondary | ICD-10-CM | POA: Diagnosis not present

## 2017-03-13 DIAGNOSIS — Z5181 Encounter for therapeutic drug level monitoring: Secondary | ICD-10-CM

## 2017-03-13 LAB — POCT INR: INR: 1.6

## 2017-03-13 MED ORDER — LEVOFLOXACIN 500 MG PO TABS: 500.0000 mg | ORAL_TABLET | Freq: Every day | ORAL | 2 refills | Status: DC

## 2017-03-13 MED ORDER — AMOXICILLIN-POT CLAVULANATE 875-125 MG PO TABS: 1.0000 | ORAL_TABLET | Freq: Two times a day (BID) | ORAL | 2 refills | Status: DC

## 2017-03-13 NOTE — Patient Instructions (Addendum)
INR today 1.6   Take 2.5mg  (1/2pill) daily EXCEPT for 5mg  on Tuesdays and Saturdays only.  Recheck in 1 week at next doctor's appointment.  Patient is currently on several weeks of abx therapy (augmentin and levaquin) due to left foot ulceration with cellulitis.  He is on decreased dosing due to INR elevation risk and will continue weekly checks with clinic and MD in the meantime.

## 2017-03-13 NOTE — Assessment & Plan Note (Signed)
Marked response to the antibiotics Culture negative X-ray suspicious for bony involvement---will extend the antibiotics

## 2017-03-13 NOTE — Assessment & Plan Note (Addendum)
Needs urgent reevaluation by Dr Delana Meyer

## 2017-03-13 NOTE — Assessment & Plan Note (Signed)
Markedly better fortunately Will hold off on wound care center for now

## 2017-03-13 NOTE — Progress Notes (Signed)
Subjective:    Patient ID: Benjamin Davidson, male    DOB: 01-25-1951, 67 y.o.   MRN: [id number]  HPI Here with sister for follow up of cellulitis and foot ulcer  No fever Still feels tired and weak No problems with the antibiotics  Doesn't seem to look any better  Current Outpatient Medications on File Prior to Visit  Medication Sig Dispense Refill  . amoxicillin-clavulanate (AUGMENTIN) 875-125 MG tablet Take 1 tablet by mouth 2 (two) times daily for 10 days. 20 tablet 0  . aspirin EC 325 MG EC tablet Take 325 mg by mouth daily.      . carvedilol (COREG) 6.25 MG tablet TAKE 1 TABLET BY MOUTH TWICE (2) DAILY 180 tablet 0  . cyclobenzaprine (FLEXERIL) 5 MG tablet Take 1 tablet (5 mg total) by mouth 2 (two) times daily as needed for muscle spasms. 20 tablet 0  . furosemide (LASIX) 20 MG tablet TAKE 1 TABLET BY MOUTH ONCE A DAY ** MAYTAKE ONE ADDITIONAL TABLET IFINCREASED SWELLING 60 tablet 3  . gabapentin (NEURONTIN) 300 MG capsule Take 1 capsule (300 mg total) by mouth 2 (two) times daily. 60 capsule 11  . Insulin Pen Needle (NOVOFINE 31) 31G X 6 MM MISC by Does not apply route.      . Insulin Syringe-Needle U-100 (PRODIGY INSULIN SYRINGE) 31G X 5/16" 0.5 ML MISC Inject 1 Syringe into the skin 2 (two) times daily. 100 each 7  . levofloxacin (LEVAQUIN) 500 MG tablet Take 1 tablet (500 mg total) by mouth daily. 10 tablet 0  . losartan (COZAAR) 25 MG tablet Take 1 tablet (25 mg total) by mouth daily. 30 tablet 0  . NOVOLIN 70/30 (70-30) 100 UNIT/ML injection INJECT 35 UNITS SUBCUTANEOUSLY TWICE A DAY AS DIRECTED 20 mL 11  . NOVOLIN R 100 UNIT/ML injection as needed.     . rosuvastatin (CRESTOR) 20 MG tablet Take 1 tablet (20 mg total) by mouth daily. 90 tablet 3  . sertraline (ZOLOFT) 100 MG tablet TAKE 1 TABLET BY MOUTH ONCE DAILY 90 tablet 3  . warfarin (COUMADIN) 5 MG tablet TAKE AS DIRECTED BY ANTICOAGULATION CLINIC 100 tablet 0   No current facility-administered medications on file  prior to visit.     Allergies  Allergen Reactions  . Lisinopril     Cough Flu like symptoms     Past Medical History:  Diagnosis Date  . Anemia   . Asthma    childhood  . CHF (congestive heart failure) ([place])   . Coronary artery disease   . DM (diabetes mellitus), type 2, uncontrolled, periph vascular complic ([place])    Type II  . Edema    feet/legs  . Hypertension   . Left leg DVT ([place])   . MDD (recurrent major depressive disorder) in remission ([place])   . Peripheral vascular disease (HCC)    Left toe amputations. Dr Lucky Cowboy  . Pure hypercholesterolemia   . Shortness of breath dyspnea   . Syncope    resolved    Past Surgical History:  Procedure Laterality Date  . CARDIAC CATHETERIZATION    . CATARACT EXTRACTION W/PHACO Left 06/03/2015   Procedure: CATARACT EXTRACTION PHACO AND INTRAOCULAR LENS PLACEMENT (IOC);  Surgeon: Eulogio Bear, MD;  Location: [facility] ORS;  Service: Ophthalmology;  Laterality: Left;  Lot# [id number] H [place]: 1.06 AP%: 5.5 CDE: 3.69  . CORONARY ARTERY BYPASS GRAFT    . EYE SURGERY    . PARS PLANA VITRECTOMY Right 02/17/2015   Procedure: PARS  PLANA VITRECTOMY WITH 25 GAUGE, endolaser, right eye;  Surgeon: Milus Height, MD;  Location: [facility] ORS;  Service: Ophthalmology;  Laterality: Right;  fluid pack lot # [id number] H endolaser: 200w, [id number]  . PERCUTANEOUS STENT INTERVENTION     right leg  . PERIPHERALLY INSERTED CENTRAL CATHETER INSERTION  2013  . TOE AMPUTATION     left 5th, right 4th and 5th    Family History  Problem Relation Age of Onset  . Heart disease Sister   . Hyperlipidemia Brother     Social History   Socioeconomic History  . Marital status: Divorced    Spouse name: Not on file  . Number of children: 0  . Years of education: Not on file  . Highest education level: Not on file  Social Needs  . Financial resource strain: Not on file  . Food insecurity - worry: Not on file  . Food insecurity - inability: Not on file  . Transportation  needs - medical: Not on file  . Transportation needs - non-medical: Not on file  Occupational History  . Occupation: [name]    Comment: Disabled since 2000's  Tobacco Use  . Smoking status: Never Smoker  . Smokeless tobacco: Never Used  Substance and Sexual Activity  . Alcohol use: No    Alcohol/week: 0.0 oz  . Drug use: No  . Sexual activity: Not on file  Other Topics Concern  . Not on file  Social History Narrative   No living will   Requests [name], sister, as health care POA. May have done official forms   Would accept resuscitation attempts   Not sure about tube feeds   Review of Systems Appetite is okay Not really eating healthy--she states "pork and beans" is the staple    Objective:   Physical Exam  Constitutional: No distress.  Skin:  Marked improvement in ulcer Now with clean granulation and not as deep (very superficial) Inflammation is better both in wound and surrounding areas          Assessment & Plan:

## 2017-03-15 ENCOUNTER — Encounter (INDEPENDENT_AMBULATORY_CARE_PROVIDER_SITE_OTHER): Payer: Self-pay | Admitting: Vascular Surgery

## 2017-03-15 ENCOUNTER — Inpatient Hospital Stay: Payer: Medicare HMO

## 2017-03-15 ENCOUNTER — Encounter: Payer: Self-pay | Admitting: Internal Medicine

## 2017-03-15 ENCOUNTER — Ambulatory Visit (INDEPENDENT_AMBULATORY_CARE_PROVIDER_SITE_OTHER): Payer: Medicare HMO | Admitting: Vascular Surgery

## 2017-03-15 ENCOUNTER — Encounter: Payer: Self-pay | Admitting: Vascular Surgery

## 2017-03-15 ENCOUNTER — Other Ambulatory Visit: Payer: Self-pay

## 2017-03-15 ENCOUNTER — Inpatient Hospital Stay
Admission: AD | Admit: 2017-03-15 | Discharge: 2017-03-20 | DRG: 617 | Disposition: A | Discharge status: Home / Self Care | Payer: Medicare HMO | Source: Ambulatory Visit | Attending: Vascular Surgery | Admitting: Vascular Surgery

## 2017-03-15 VITALS — BP 145/81 | HR 58 | Resp 16 | Ht 68.0 in | Wt 250.4 lb

## 2017-03-15 DIAGNOSIS — I739 Peripheral vascular disease, unspecified: Secondary | ICD-10-CM

## 2017-03-15 DIAGNOSIS — I2510 Atherosclerotic heart disease of native coronary artery without angina pectoris: Secondary | ICD-10-CM | POA: Diagnosis present

## 2017-03-15 DIAGNOSIS — Z7901 Long term (current) use of anticoagulants: Secondary | ICD-10-CM

## 2017-03-15 DIAGNOSIS — Z7982 Long term (current) use of aspirin: Secondary | ICD-10-CM | POA: Diagnosis not present

## 2017-03-15 DIAGNOSIS — E1151 Type 2 diabetes mellitus with diabetic peripheral angiopathy without gangrene: Secondary | ICD-10-CM | POA: Diagnosis present

## 2017-03-15 DIAGNOSIS — I10 Essential (primary) hypertension: Secondary | ICD-10-CM | POA: Diagnosis not present

## 2017-03-15 DIAGNOSIS — I130 Hypertensive heart and chronic kidney disease with heart failure and stage 1 through stage 4 chronic kidney disease, or unspecified chronic kidney disease: Secondary | ICD-10-CM | POA: Diagnosis not present

## 2017-03-15 DIAGNOSIS — E119 Type 2 diabetes mellitus without complications: Secondary | ICD-10-CM | POA: Diagnosis not present

## 2017-03-15 DIAGNOSIS — J45909 Unspecified asthma, uncomplicated: Secondary | ICD-10-CM | POA: Diagnosis not present

## 2017-03-15 DIAGNOSIS — Z79899 Other long term (current) drug therapy: Secondary | ICD-10-CM

## 2017-03-15 DIAGNOSIS — Z794 Long term (current) use of insulin: Secondary | ICD-10-CM | POA: Diagnosis not present

## 2017-03-15 DIAGNOSIS — N183 Chronic kidney disease, stage 3 (moderate): Secondary | ICD-10-CM | POA: Diagnosis present

## 2017-03-15 DIAGNOSIS — M869 Osteomyelitis, unspecified: Secondary | ICD-10-CM

## 2017-03-15 DIAGNOSIS — E1142 Type 2 diabetes mellitus with diabetic polyneuropathy: Secondary | ICD-10-CM | POA: Diagnosis present

## 2017-03-15 DIAGNOSIS — L97522 Non-pressure chronic ulcer of other part of left foot with fat layer exposed: Secondary | ICD-10-CM | POA: Diagnosis not present

## 2017-03-15 DIAGNOSIS — S92312A Displaced fracture of first metatarsal bone, left foot, initial encounter for closed fracture: Secondary | ICD-10-CM | POA: Diagnosis not present

## 2017-03-15 DIAGNOSIS — Z86718 Personal history of other venous thrombosis and embolism: Secondary | ICD-10-CM

## 2017-03-15 DIAGNOSIS — F3340 Major depressive disorder, recurrent, in remission, unspecified: Secondary | ICD-10-CM | POA: Diagnosis present

## 2017-03-15 DIAGNOSIS — I70201 Unspecified atherosclerosis of native arteries of extremities, right leg: Secondary | ICD-10-CM | POA: Diagnosis not present

## 2017-03-15 DIAGNOSIS — L03116 Cellulitis of left lower limb: Secondary | ICD-10-CM | POA: Diagnosis present

## 2017-03-15 DIAGNOSIS — Z888 Allergy status to other drugs, medicaments and biological substances status: Secondary | ICD-10-CM | POA: Diagnosis not present

## 2017-03-15 DIAGNOSIS — I70245 Atherosclerosis of native arteries of left leg with ulceration of other part of foot: Secondary | ICD-10-CM | POA: Diagnosis not present

## 2017-03-15 DIAGNOSIS — L97529 Non-pressure chronic ulcer of other part of left foot with unspecified severity: Secondary | ICD-10-CM | POA: Diagnosis present

## 2017-03-15 DIAGNOSIS — Z951 Presence of aortocoronary bypass graft: Secondary | ICD-10-CM

## 2017-03-15 DIAGNOSIS — E7800 Pure hypercholesterolemia, unspecified: Secondary | ICD-10-CM | POA: Diagnosis present

## 2017-03-15 DIAGNOSIS — R69 Illness, unspecified: Secondary | ICD-10-CM | POA: Diagnosis not present

## 2017-03-15 DIAGNOSIS — E1140 Type 2 diabetes mellitus with diabetic neuropathy, unspecified: Secondary | ICD-10-CM | POA: Diagnosis not present

## 2017-03-15 DIAGNOSIS — I70248 Atherosclerosis of native arteries of left leg with ulceration of other part of lower left leg: Secondary | ICD-10-CM | POA: Diagnosis not present

## 2017-03-15 DIAGNOSIS — E1122 Type 2 diabetes mellitus with diabetic chronic kidney disease: Secondary | ICD-10-CM | POA: Diagnosis present

## 2017-03-15 DIAGNOSIS — E11621 Type 2 diabetes mellitus with foot ulcer: Secondary | ICD-10-CM | POA: Diagnosis not present

## 2017-03-15 DIAGNOSIS — I509 Heart failure, unspecified: Secondary | ICD-10-CM | POA: Diagnosis not present

## 2017-03-15 DIAGNOSIS — L02619 Cutaneous abscess of unspecified foot: Secondary | ICD-10-CM

## 2017-03-15 DIAGNOSIS — E1169 Type 2 diabetes mellitus with other specified complication: Secondary | ICD-10-CM | POA: Diagnosis not present

## 2017-03-15 DIAGNOSIS — M86172 Other acute osteomyelitis, left ankle and foot: Secondary | ICD-10-CM | POA: Diagnosis not present

## 2017-03-15 DIAGNOSIS — L03119 Cellulitis of unspecified part of limb: Secondary | ICD-10-CM

## 2017-03-15 LAB — BASIC METABOLIC PANEL
ANION GAP: 10 (ref 5–15)
BUN: 23 mg/dL — ABNORMAL HIGH (ref 6–20)
CALCIUM: 8.7 mg/dL — AB (ref 8.9–10.3)
CHLORIDE: 103 mmol/L (ref 101–111)
CO2: 24 mmol/L (ref 22–32)
CREATININE: 1.13 mg/dL (ref 0.61–1.24)
GFR calc non Af Amer: >60 mL/min (ref >60)
Glucose, Bld: 227 mg/dL — ABNORMAL HIGH (ref 65–99)
Potassium: 3.8 mmol/L (ref 3.5–5.1)
SODIUM: 137 mmol/L (ref 135–145)

## 2017-03-15 LAB — CBC
HCT: 31.1 % — ABNORMAL LOW (ref 40.0–52.0)
HEMOGLOBIN: 10.3 g/dL — AB (ref 13.0–18.0)
MCH: 26.5 pg (ref 26.0–34.0)
MCHC: 33 g/dL (ref 32.0–36.0)
MCV: 80.1 fL (ref 80.0–100.0)
Platelets: 448 10*3/uL — ABNORMAL HIGH (ref 150–440)
RBC: 3.88 MIL/uL — ABNORMAL LOW (ref 4.40–5.90)
RDW: 14.8 % — AB (ref 11.5–14.5)
WBC: 10.8 10*3/uL — AB (ref 3.8–10.6)

## 2017-03-15 LAB — TYPE AND SCREEN
ABO/RH(D): A POS
Antibody Screen: NEGATIVE

## 2017-03-15 LAB — PROTIME-INR
INR: 1.7
Prothrombin Time: 19.8 seconds — ABNORMAL HIGH (ref 11.4–15.2)

## 2017-03-15 LAB — APTT: APTT: 39 s — AB (ref 24–36)

## 2017-03-15 LAB — GLUCOSE, CAPILLARY
Glucose-Capillary: 188 mg/dL — ABNORMAL HIGH (ref 65–99)
Glucose-Capillary: 164 mg/dL — ABNORMAL HIGH (ref 65–99)
Glucose-Capillary: 138 mg/dL — ABNORMAL HIGH (ref 65–99)

## 2017-03-15 LAB — MAGNESIUM: MAGNESIUM: 1.8 mg/dL (ref 1.7–2.4)

## 2017-03-15 LAB — HEPARIN LEVEL (UNFRACTIONATED): Heparin Unfractionated: 0.17 IU/mL — ABNORMAL LOW (ref 0.30–0.70)

## 2017-03-15 MED ORDER — SERTRALINE HCL 50 MG PO TABS
100.0000 mg | ORAL_TABLET | Freq: Every day | ORAL | Status: DC
  Administered 2017-03-15 – 2017-03-19 (×5): 100 mg via ORAL
  Filled 2017-03-15 (×5): qty 2

## 2017-03-15 MED ORDER — SODIUM CHLORIDE 0.9 % IV SOLN
INTRAVENOUS | Status: DC
  Administered 2017-03-15: 13:00:00 via INTRAVENOUS

## 2017-03-15 MED ORDER — GABAPENTIN 300 MG PO CAPS
300.0000 mg | ORAL_CAPSULE | Freq: Two times a day (BID) | ORAL | Status: DC
  Administered 2017-03-15 – 2017-03-19 (×8): 300 mg via ORAL
  Filled 2017-03-15 (×8): qty 1

## 2017-03-15 MED ORDER — FUROSEMIDE 20 MG PO TABS
20.0000 mg | ORAL_TABLET | Freq: Every day | ORAL | Status: DC
  Administered 2017-03-16 – 2017-03-20 (×4): 20 mg via ORAL
  Filled 2017-03-15 (×4): qty 1

## 2017-03-15 MED ORDER — INSULIN ASPART 100 UNIT/ML ~~LOC~~ SOLN
0.0000 [IU] | Freq: Three times a day (TID) | SUBCUTANEOUS | Status: DC
  Administered 2017-03-15 – 2017-03-16 (×4): 3 [IU] via SUBCUTANEOUS
  Filled 2017-03-15 (×5): qty 1

## 2017-03-15 MED ORDER — OXYCODONE HCL 5 MG PO TABS
5.0000 mg | ORAL_TABLET | Freq: Four times a day (QID) | ORAL | Status: DC | PRN
  Administered 2017-03-15 – 2017-03-18 (×3): 5 mg via ORAL
  Filled 2017-03-15 (×3): qty 1

## 2017-03-15 MED ORDER — HEPARIN (PORCINE) IN NACL 100-0.45 UNIT/ML-% IJ SOLN
1650.0000 [IU]/h | INTRAMUSCULAR | Status: AC
  Administered 2017-03-15: 1450 [IU]/h via INTRAVENOUS
  Administered 2017-03-16 (×2): 1650 [IU]/h via INTRAVENOUS
  Filled 2017-03-15 (×3): qty 250

## 2017-03-15 MED ORDER — ONDANSETRON HCL 4 MG PO TABS: 4.0000 mg | ORAL_TABLET | Freq: Four times a day (QID) | ORAL | Status: DC | PRN

## 2017-03-15 MED ORDER — LOSARTAN POTASSIUM 25 MG PO TABS
25.0000 mg | ORAL_TABLET | Freq: Every day | ORAL | Status: DC
  Administered 2017-03-16 – 2017-03-20 (×4): 25 mg via ORAL
  Filled 2017-03-15 (×4): qty 1

## 2017-03-15 MED ORDER — INSULIN ASPART 100 UNIT/ML ~~LOC~~ SOLN
8.0000 [IU] | Freq: Three times a day (TID) | SUBCUTANEOUS | Status: DC
  Administered 2017-03-15 – 2017-03-16 (×4): 8 [IU] via SUBCUTANEOUS
  Filled 2017-03-15 (×5): qty 1

## 2017-03-15 MED ORDER — INSULIN ASPART 100 UNIT/ML ~~LOC~~ SOLN: 0.0000 [IU] | Freq: Every day | SUBCUTANEOUS | Status: DC

## 2017-03-15 MED ORDER — ACETAMINOPHEN 650 MG RE SUPP: 650.0000 mg | Freq: Four times a day (QID) | RECTAL | Status: DC | PRN

## 2017-03-15 MED ORDER — OXYCODONE HCL 5 MG PO TABS: 10.0000 mg | ORAL_TABLET | Freq: Four times a day (QID) | ORAL | Status: DC | PRN

## 2017-03-15 MED ORDER — VANCOMYCIN HCL 10 G IV SOLR
1250.0000 mg | Freq: Once | INTRAVENOUS | Status: AC
  Administered 2017-03-16: 1250 mg via INTRAVENOUS
  Filled 2017-03-15: qty 1250

## 2017-03-15 MED ORDER — ROSUVASTATIN CALCIUM 10 MG PO TABS
20.0000 mg | ORAL_TABLET | Freq: Every day | ORAL | Status: DC
  Administered 2017-03-15 – 2017-03-20 (×6): 20 mg via ORAL
  Filled 2017-03-15 (×6): qty 2

## 2017-03-15 MED ORDER — ASPIRIN EC 325 MG PO TBEC: 325.0000 mg | DELAYED_RELEASE_TABLET | Freq: Every day | ORAL | Status: DC

## 2017-03-15 MED ORDER — WARFARIN SODIUM 5 MG PO TABS
5.0000 mg | ORAL_TABLET | Freq: Every day | ORAL | Status: DC
  Filled 2017-03-15: qty 1

## 2017-03-15 MED ORDER — ASPIRIN EC 81 MG PO TBEC
81.0000 mg | DELAYED_RELEASE_TABLET | Freq: Every day | ORAL | Status: DC
  Administered 2017-03-16 – 2017-03-19 (×3): 81 mg via ORAL
  Filled 2017-03-15 (×3): qty 1

## 2017-03-15 MED ORDER — WARFARIN - PHYSICIAN DOSING INPATIENT: Freq: Every day | Status: DC

## 2017-03-15 MED ORDER — INSULIN ASPART PROT & ASPART (70-30 MIX) 100 UNIT/ML ~~LOC~~ SUSP
35.0000 [IU] | Freq: Two times a day (BID) | SUBCUTANEOUS | Status: DC
Start: 2017-03-15 — End: 2017-03-15

## 2017-03-15 MED ORDER — MAGNESIUM SULFATE 2 GM/50ML IV SOLN
2.0000 g | Freq: Once | INTRAVENOUS | Status: AC
  Administered 2017-03-15: 2 g via INTRAVENOUS
  Filled 2017-03-15: qty 50

## 2017-03-15 MED ORDER — ACETAMINOPHEN 325 MG PO TABS: 650.0000 mg | ORAL_TABLET | Freq: Four times a day (QID) | ORAL | Status: DC | PRN

## 2017-03-15 MED ORDER — CYCLOBENZAPRINE HCL 10 MG PO TABS
5.0000 mg | ORAL_TABLET | Freq: Two times a day (BID) | ORAL | Status: DC | PRN
  Administered 2017-03-18: 5 mg via ORAL
  Filled 2017-03-15: qty 1

## 2017-03-15 MED ORDER — PIPERACILLIN-TAZOBACTAM 3.375 G IVPB
3.3750 g | Freq: Three times a day (TID) | INTRAVENOUS | Status: DC
  Administered 2017-03-15 – 2017-03-20 (×15): 3.375 g via INTRAVENOUS
  Filled 2017-03-15 (×15): qty 50

## 2017-03-15 MED ORDER — CARVEDILOL 6.25 MG PO TABS
6.2500 mg | ORAL_TABLET | Freq: Two times a day (BID) | ORAL | Status: DC
  Administered 2017-03-15 – 2017-03-20 (×11): 6.25 mg via ORAL
  Filled 2017-03-15 (×2): qty 1
  Filled 2017-03-15: qty 2
  Filled 2017-03-15: qty 1
  Filled 2017-03-15 (×3): qty 2
  Filled 2017-03-15 (×3): qty 1
  Filled 2017-03-15: qty 2
  Filled 2017-03-15 (×5): qty 1
  Filled 2017-03-15 (×6): qty 2

## 2017-03-15 MED ORDER — INSULIN DETEMIR 100 UNIT/ML ~~LOC~~ SOLN
20.0000 [IU] | Freq: Two times a day (BID) | SUBCUTANEOUS | Status: DC
  Administered 2017-03-15 – 2017-03-20 (×8): 20 [IU] via SUBCUTANEOUS
  Filled 2017-03-15 (×12): qty 0.2

## 2017-03-15 MED ORDER — ONDANSETRON HCL 4 MG/2ML IJ SOLN: 4.0000 mg | Freq: Four times a day (QID) | INTRAMUSCULAR | Status: DC | PRN

## 2017-03-15 NOTE — Consult Note (Signed)
Reason for Consult: Medical management Referring Physician: Dr. Lawanda Cousins Torrens is an 67 y.o. male.  HPI: [name] is a 67 year old male with a past medical history of insulin-dependent diabetes metas, hypertension, hyperlipidemia, chronic kidney disease stage III, coronary artery disease and peripheral arterial disease is sent over to the hospital as a direct admit from Dr. Nino Parsley office as his left foot ulcer is getting worse.  Patient is resting comfortably and denies any complaints during my examination.  Hospitalist team is consulted for medical management of his medical problems including diabetes hypertension, chronic kidney disease and coronary artery disease.  Patient is on Coumadin for chronic left lower extremity DVT   Past Medical History:  Diagnosis Date  . Anemia   . Asthma    childhood  . CHF (congestive heart failure) ([place])   . Coronary artery disease   . DM (diabetes mellitus), type 2, uncontrolled, periph vascular complic ([place])    Type II  . Edema    feet/legs  . Hypertension   . Left leg DVT ([place])   . MDD (recurrent major depressive disorder) in remission ([place])   . Peripheral vascular disease (HCC)    Left toe amputations. Dr Lucky Cowboy  . Pure hypercholesterolemia   . Shortness of breath dyspnea   . Syncope    resolved    Past Surgical History:  Procedure Laterality Date  . CARDIAC CATHETERIZATION    . CATARACT EXTRACTION W/PHACO Left 06/03/2015   Procedure: CATARACT EXTRACTION PHACO AND INTRAOCULAR LENS PLACEMENT (IOC);  Surgeon: Eulogio Bear, MD;  Location: [facility] ORS;  Service: Ophthalmology;  Laterality: Left;  Lot# [id number] H [place]: 1.06 AP%: 5.5 CDE: 3.69  . CORONARY ARTERY BYPASS GRAFT    . EYE SURGERY    . PARS PLANA VITRECTOMY Right 02/17/2015   Procedure: PARS PLANA VITRECTOMY WITH 25 GAUGE, endolaser, right eye;  Surgeon: Milus Height, MD;  Location: [facility] ORS;  Service: Ophthalmology;  Laterality: Right;  fluid pack lot #  [id number] H endolaser: 200w, [id number]  . PERCUTANEOUS STENT INTERVENTION     right leg  . PERIPHERALLY INSERTED CENTRAL CATHETER INSERTION  2013  . TOE AMPUTATION     left 5th, right 4th and 5th    Family History  Problem Relation Age of Onset  . Heart disease Sister   . Hyperlipidemia Brother     Social History:  reports that  has never smoked. he has never used smokeless tobacco. He reports that he does not drink alcohol or use drugs.  Allergies:  Allergies  Allergen Reactions  . Lisinopril     Cough Flu like symptoms     Medications: I have reviewed the patient's current medications.  Results for orders placed or performed during the hospital encounter of 03/15/17 (from the past 48 hour(s))  CBC     Status: Abnormal   Collection Time: 03/15/17 11:25 AM  Result Value Ref Range   WBC 10.8 (H) 3.8 - 10.6 K/uL   RBC 3.88 (L) 4.40 - 5.90 MIL/uL   Hemoglobin 10.3 (L) 13.0 - 18.0 g/dL   HCT 31.1 (L) 40.0 - 52.0 %   MCV 80.1 80.0 - 100.0 fL   MCH 26.5 26.0 - 34.0 pg   MCHC 33.0 32.0 - 36.0 g/dL   RDW 14.8 (H) 11.5 - 14.5 %   Platelets 448 (H) 150 - 440 K/uL    Comment: Performed at [facility], [address], [place], [place] [address]  Basic metabolic panel  Status: Abnormal   Collection Time: 03/15/17 11:25 AM  Result Value Ref Range   Sodium 137 135 - 145 mmol/L   Potassium 3.8 3.5 - 5.1 mmol/L   Chloride 103 101 - 111 mmol/L   CO2 24 22 - 32 mmol/L   Glucose, Bld 227 (H) 65 - 99 mg/dL   BUN 23 (H) 6 - 20 mg/dL   Creatinine, Ser 1.13 0.61 - 1.24 mg/dL   Calcium 8.7 (L) 8.9 - 10.3 mg/dL   GFR calc non Af Amer >60 >60 mL/min   GFR calc Af Amer >60 >60 mL/min    Comment: (NOTE) The eGFR has been calculated using the CKD EPI equation. This calculation has not been validated in all clinical situations. eGFR's persistently <60 mL/min signify possible Chronic Kidney Disease.    Anion gap 10 5 - 15    Comment: Performed at [facility], [address] [place], [place] [address]  Magnesium     Status: None   Collection Time: 03/15/17 11:25 AM  Result Value Ref Range   Magnesium 1.8 1.7 - 2.4 mg/dL    Comment: Performed at [facility], [address] [place], [place] [address]  APTT     Status: Abnormal   Collection Time: 03/15/17 11:25 AM  Result Value Ref Range   aPTT 39 (H) 24 - 36 seconds    Comment:        IF BASELINE aPTT IS ELEVATED, SUGGEST PATIENT RISK ASSESSMENT BE USED TO DETERMINE APPROPRIATE ANTICOAGULANT THERAPY. Performed at [facility], [address] [place], [place] [address]   Type and screen     Status: None   Collection Time: 03/15/17 11:25 AM  Result Value Ref Range   ABO/RH(D) A POS    Antibody Screen NEG    Sample Expiration      03/18/2017 Performed at [facility], [address] [place], [place] [address]   Protime-INR     Status: Abnormal   Collection Time: 03/15/17 11:25 AM  Result Value Ref Range   Prothrombin Time 19.8 (H) 11.4 - 15.2 seconds   INR 1.70     Comment: Performed at [facility], [address] [place], [place] [address]  Glucose, capillary     Status: Abnormal   Collection Time: 03/15/17  1:03 PM  Result Value Ref Range   Glucose-Capillary 188 (H) 65 - 99 mg/dL  Glucose, capillary     Status: Abnormal   Collection Time: 03/15/17  5:05 PM  Result Value Ref Range   Glucose-Capillary 164 (H) 65 - 99 mg/dL    Dg Foot 2 Views Left  Result Date: 03/15/2017 CLINICAL DATA:  Evaluate for osteomyelitis. EXAM: LEFT FOOT - 2 VIEW COMPARISON:  Left foot x-rays dated March 08, 2017. FINDINGS: Ulceration along the medial aspect of the first MTP joint again noted. New fracture of the first metatarsal head with 7 mm medial displacement. There is new osteolysis and irregularity of the base of the first proximal phalanx. 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 Unchanged healed fourth proximal metatarsal  shaft fracture. Unchanged fourth PIP joint destruction. No subcutaneous emphysema. IMPRESSION: 1. Osteomyelitis about the first MTP joint with new displaced pathologic fracture of the first metatarsal head. Electronically Signed   By: Titus Dubin M.D.   On: 03/15/2017 11:36    ROS:  CONSTITUTIONAL: Denies fevers, chills. Denies any fatigue, weakness.  EYES: Denies blurry vision, double vision, eye pain. EARS, NOSE, THROAT: Denies tinnitus,  ear pain, hearing loss. RESPIRATORY: Denies cough, wheeze, shortness of breath.  CARDIOVASCULAR: Denies chest pain, palpitations, edema.  GASTROINTESTINAL: Denies nausea, vomiting, diarrhea, abdominal pain. Denies bright red blood per rectum. GENITOURINARY: Denies dysuria, hematuria. ENDOCRINE: Denies nocturia or thyroid problems. HEMATOLOGIC AND LYMPHATIC: Denies easy bruising or bleeding. SKIN: Denies rash or lesion. MUSCULOSKELETAL: Reports left foot ulcer denies pain in neck, back, shoulder, knees, hips or arthritic symptoms.  NEUROLOGIC: Denies paralysis, paresthesias.  PSYCHIATRIC: Denies anxiety or depressive symptoms. Blood pressure (!) 122/42, pulse 77, temperature 98.9 F (37.2 C), temperature source Oral, resp. rate 20, SpO2 97 %.   PHYSICAL EXAMINATION:  GENERAL: Well-nourished, well-developed  currently in no acute distress.  HEAD: Normocephalic, atraumatic.  EYES: Pupils equal, round, and reactive to light. Extraocular muscles intact. No scleral icterus.  MOUTH: Moist mucosal membranes. Dentition intact. No abscess noted. EARS, NOSE, THROAT: Clear without exudates. No external lesions.  NECK: Supple. No thyromegaly. No nodules. No JVD.  PULMONARY: Clear to auscultation bilaterally without wheezes, rales, or rhonchi. No use of accessory muscles. Good respiratory effort. CHEST: Nontender to palpation.  CARDIOVASCULAR: S1, S2, regular rate and rhythm. No murmurs, rubs, or gallops.  GASTROINTESTINAL: Soft, nontender, nondistended. No  masses. Positive bowel sounds. No hepatosplenomegaly. MUSCULOSKELETAL: Left foot ulcer and clean dressing no swelling, clubbing, edema. Range of motion full in all extremities. NEUROLOGIC: Cranial nerves II-XII intact. No gross focal neurological deficits. Sensation intact. Reflexes intact. SKIN: No ulcerations, lesions, rash, cyanosis. Skin warm, dry. Turgor intact. PSYCHIATRIC: Mood, affect within normal limits. Patient awake, alert, oriented x 3. Insight and judgment intact.   Assessment/Plan:  #Insulin-dependent diabetes mellitus Check hemoglobin A1c Hold 70 3035 units twice daily which is his home medication  Will start patient on Levemir 20 units twice daily, sliding scale insulin and meal dose insulin  #Acute left foot ulcer Antibiotic Zosyn and vancomycin Dr. Delana Meyer is planning to do surgery probably on Tuesday Podiatry consulted Pain management as needed  #History of left leg DVT Patient is on Coumadin will discontinue that and start the patient on heparin drip without bolus as Dr. Delana Meyer is planning to do surgery   #Essential hypertension blood pressure is stable at this time Continue Coreg and Cozaar  #History of coronary artery disease   patient is a symptomatic. Continue home dose medications aspirin, Coreg, Cozaar and statin  Thank you Dr. Delana Meyer for allowing hospitalist team to take care of this patient  TOTAL TIME TAKING CARE OF THIS PATIENT: 41 minutes.   Note: This dictation was prepared with Dragon dictation along with smaller phrase technology. Any transcriptional errors that result from this process are unintentional.   @[facility] @ Pager - [phone] 03/15/2017, 5:15 PM

## 2017-03-15 NOTE — Progress Notes (Signed)
Inpatient Diabetes Program Recommendations  AACE/ADA: New Consensus Statement on Inpatient Glycemic Control (2015)  Target Ranges:  Prepandial:   less than 140 mg/dL      Peak postprandial:   less than 180 mg/dL (1-2 hours)      Critically ill patients:  140 - 180 mg/dL   Lab Results  Component Value Date   GLUCAP 122 (H) 06/03/2015   HGBA1C 7.4 (H) 02/06/2017    Review of Glycemic Control-None labs or CBG available  Diabetes history: Type 2 Outpatient Diabetes medications: Novolin 70/30 35 units bid, Novolin R as needed (sliding scale - ordered by Dr. Rockey Situ) - has used it in the past couple of weeks  Current orders for Inpatient glycemic control: Novolog 70/30  35 units bid, Novolog 0-15 units tid, Novolog 0-5 units qhs  Inpatient Diabetes Program Recommendations: Patient reports he took no insulin this am.    Consider d/c Novolog 70/30 and order Levemir 20 units bid, and  Novolog 8 units tid (20 % reduction from home doses), continue Novolog correction as ordered.   Met with patient and his sister to discuss home insulin doses. Patient reports that he does wake in the middle of the night occasionally with CBG 74 mg/dl- he typically will eat a small can of beans and return to bed.  Reviewed options for a snack such a 1/2 peanut butter sandwich or 1 package of Laural Roes- they have carb and protein and will help keep blood sugar stable until the am.  Encouraged to discuss with MD if CBG are less than 80 mg/dl consistently as dose of insulin may need to be adjusted.     Gentry Fitz, RN, BA, MHA, CDE Diabetes Coordinator Inpatient Diabetes Program  [phone] (Team Pager) [phone] ([place]) 03/15/2017 11:43 AM

## 2017-03-15 NOTE — Consult Note (Signed)
ANTICOAGULATION CONSULT NOTE - Initial Consult  Pharmacy Consult for Heparin dosing and monitoring  Indication: VTE prophylaxis, PMH of DVT    Patient Measurements: Heparin Dosing Weight: 86kg  Vital Signs: Temp: 98.5 F (36.9 C) (02/14 2136) Temp Source: Oral (02/14 2136) BP: 134/69 (02/14 2136) Pulse Rate: 74 (02/14 2136)  Labs: Recent Labs    03/13/17 03/15/17 1125 03/15/17 2140  HGB  --  10.3*  --   HCT  --  31.1*  --   PLT  --  448*  --   APTT  --  39*  --   LABPROT  --  19.8*  --   INR 1.6 1.70  --   HEPARINUNFRC  --   --  0.17*  CREATININE  --  1.13  --     Estimated Creatinine Clearance: 79.7 mL/min (by C-G formula based on SCr of 1.13 mg/dL).    Assessment: Pharmacy consulted for heparin dosing and monitoring in 67 yo male with PMH of DVT. Patient was taking warfarin 5mg  QD PTA. INR was subtherapeutic at 1.7 on admission.   Goal of Therapy:  Heparin level 0.3-0.7 units/ml Monitor platelets by anticoagulation protocol: Yes   Plan:  Warfarin has been discontinued  No bolus per MD request.  Heparin Dosing Weight: 86kg Start heparin infusion at 1450 units/hr Check anti-Xa level in 6 hours and daily while on heparin Continue to monitor H&H and platelets  02/14 @ 2140 HL 0.17 subtherapeutic. Will increase rate to 1650 units/hr and will recheck HL @ 0300, no bolus per MD.  Tobie Lords, PharmD, BCPS Clinical Pharmacist 03/15/2017 11:02 PM

## 2017-03-15 NOTE — Consult Note (Signed)
Reason for Consult: Osteomyelitis with cellulitis left foot. Referring Physician: [name] is an 67 y.o. male.  HPI: This is a 67 year old diabetic male with a history of neuropathy with recent development in the last few weeks of a sore on his left foot.  States he recently received some insoles in his shoes from his primary care and does not know if this caused this area to rub.  He was seen by his primary care about a week ago and placed on oral antibiotics.  Referred to vascular surgery who saw him this morning and direct admitted to the hospital for infection and osteomyelitis.  Past Medical History:  Diagnosis Date  . Anemia   . Asthma    childhood  . CHF (congestive heart failure) ([place])   . Coronary artery disease   . DM (diabetes mellitus), type 2, uncontrolled, periph vascular complic ([place])    Type II  . Edema    feet/legs  . Hypertension   . Left leg DVT ([place])   . MDD (recurrent major depressive disorder) in remission ([place])   . Peripheral vascular disease (HCC)    Left toe amputations. Dr Benjamin Davidson  . Pure hypercholesterolemia   . Shortness of breath dyspnea   . Syncope    resolved    Past Surgical History:  Procedure Laterality Date  . CARDIAC CATHETERIZATION    . CATARACT EXTRACTION W/PHACO Left 06/03/2015   Procedure: CATARACT EXTRACTION PHACO AND INTRAOCULAR LENS PLACEMENT (IOC);  Surgeon: Benjamin Bear, MD;  Location: [facility] ORS;  Service: Ophthalmology;  Laterality: Left;  Lot# [id number] H [place]: 1.06 AP%: 5.5 CDE: 3.69  . CORONARY ARTERY BYPASS GRAFT    . EYE SURGERY    . PARS PLANA VITRECTOMY Right 02/17/2015   Procedure: PARS PLANA VITRECTOMY WITH 25 GAUGE, endolaser, right eye;  Surgeon: Benjamin Height, MD;  Location: [facility] ORS;  Service: Ophthalmology;  Laterality: Right;  fluid pack lot # [id number] endolaser: 200w, [id number]  . PERCUTANEOUS STENT INTERVENTION     right leg  . PERIPHERALLY INSERTED CENTRAL CATHETER INSERTION  2013  . TOE AMPUTATION     left 5th, right 4th and 5th    Family History  Problem Relation Age of Onset  . Heart disease Sister   . Hyperlipidemia Brother     Social History:  reports that  has never smoked. he has never used smokeless tobacco. He reports that he does not drink alcohol or use drugs.  Allergies:  Allergies  Allergen Reactions  . Lisinopril     Cough Flu like symptoms     Medications:  Scheduled: . aspirin EC  81 mg Oral Daily  . carvedilol  6.25 mg Oral BID WC  . furosemide  20 mg Oral Daily  . gabapentin  300 mg Oral BID  . insulin aspart  0-15 Units Subcutaneous TID WC  . insulin aspart  0-5 Units Subcutaneous QHS  . insulin aspart protamine- aspart  35 Units Subcutaneous BID WC  . losartan  25 mg Oral Daily  . rosuvastatin  20 mg Oral Daily  . sertraline  100 mg Oral Daily  . warfarin  5 mg Oral q1800  . Warfarin - Physician Dosing Inpatient   Does not apply q1800    Results for orders placed or performed during the hospital encounter of 03/15/17 (from the past 48 hour(s))  CBC     Status: Abnormal   Collection Time: 03/15/17 11:25 AM  Result Value Ref Range   WBC 10.8 (  H) 3.8 - 10.6 K/uL   RBC 3.88 (L) 4.40 - 5.90 MIL/uL   Hemoglobin 10.3 (L) 13.0 - 18.0 g/dL   HCT 31.1 (L) 40.0 - 52.0 %   MCV 80.1 80.0 - 100.0 fL   MCH 26.5 26.0 - 34.0 pg   MCHC 33.0 32.0 - 36.0 g/dL   RDW 14.8 (H) 11.5 - 14.5 %   Platelets 448 (H) 150 - 440 K/uL    Comment: Performed at [facility], [address] [place], [place] [address]  Basic metabolic panel     Status: Abnormal   Collection Time: 03/15/17 11:25 AM  Result Value Ref Range   Sodium 137 135 - 145 mmol/L   Potassium 3.8 3.5 - 5.1 mmol/L   Chloride 103 101 - 111 mmol/L   CO2 24 22 - 32 mmol/L   Glucose, Bld 227 (H) 65 - 99 mg/dL   BUN 23 (H) 6 - 20 mg/dL   Creatinine, Ser 1.13 0.61 - 1.24 mg/dL   Calcium 8.7 (L) 8.9 - 10.3 mg/dL   GFR calc non Af Amer >60 >60 mL/min   GFR calc Af Amer >60 >60 mL/min    Comment:  (NOTE) The eGFR has been calculated using the CKD EPI equation. This calculation has not been validated in all clinical situations. eGFR's persistently <60 mL/min signify possible Chronic Kidney Disease.    Anion gap 10 5 - 15    Comment: Performed at [facility], [address] [place], [place] [address]  Magnesium     Status: None   Collection Time: 03/15/17 11:25 AM  Result Value Ref Range   Magnesium 1.8 1.7 - 2.4 mg/dL    Comment: Performed at [facility], [address] [place], [place] [address]  APTT     Status: Abnormal   Collection Time: 03/15/17 11:25 AM  Result Value Ref Range   aPTT 39 (H) 24 - 36 seconds    Comment:        IF BASELINE aPTT IS ELEVATED, SUGGEST PATIENT RISK ASSESSMENT BE USED TO DETERMINE APPROPRIATE ANTICOAGULANT THERAPY. Performed at [facility], [address] [place], [place] [address]   Type and screen     Status: None   Collection Time: 03/15/17 11:25 AM  Result Value Ref Range   ABO/RH(D) A POS    Antibody Screen NEG    Sample Expiration      03/18/2017 Performed at [facility], [address] [place], [place] [address]   Protime-INR     Status: Abnormal   Collection Time: 03/15/17 11:25 AM  Result Value Ref Range   Prothrombin Time 19.8 (H) 11.4 - 15.2 seconds   INR 1.70     Comment: Performed at [facility], [address] [place], [place] [address]  Glucose, capillary     Status: Abnormal   Collection Time: 03/15/17  1:03 PM  Result Value Ref Range   Glucose-Capillary 188 (H) 65 - 99 mg/dL    Dg Foot 2 Views Left  Result Date: 03/15/2017 CLINICAL DATA:  Evaluate for osteomyelitis. EXAM: LEFT FOOT - 2 VIEW COMPARISON:  Left foot x-rays dated March 08, 2017. FINDINGS: Ulceration along the medial aspect of the first MTP joint again noted. New fracture of the first metatarsal head with 7 mm medial displacement. There is new osteolysis and irregularity of the base of the  first proximal phalanx. 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 Unchanged healed fourth proximal metatarsal shaft fracture.  Unchanged fourth PIP joint destruction. No subcutaneous emphysema. IMPRESSION: 1. Osteomyelitis about the first MTP joint with new displaced pathologic fracture of the first metatarsal head. Electronically Signed   By: Titus Dubin M.D.   On: 03/15/2017 11:36    Review of Systems  Constitutional: Positive for malaise/fatigue. Negative for chills and fever.  HENT: Negative.   Eyes: Negative.   Respiratory: Negative.   Cardiovascular: Negative.   Gastrointestinal: Negative for nausea and vomiting.  Genitourinary: Negative.   Musculoskeletal: Negative.   Skin:       Patient relates a draining ulceration on great toe joint area for the past couple of weeks.  Does relate receiving a new pair of insoles in his shoes recently  Neurological:       Patient relates significant numbness and neuropathy in the feet related to his diabetes.  Endo/Heme/Allergies: Negative.   Psychiatric/Behavioral: Negative.    There were no vitals taken for this visit. Physical Exam  Cardiovascular:  DP and PT pulses are trace to a weak 1/4 on the left.  Palpable on the right.  Musculoskeletal:  Stiff range of motion in the pedal joints.  Previous amputation of the left fifth ray and the right fourth and fifth toes.  Hammertoe contractures noted on the lesser toes.  Neurological:  Loss of protective threshold with a monofilament wire bilateral in the toes and feet.  Proprioception impaired.  Skin:  The skin is warm dry and mildly atrophic.  Significant erythema and edema in the left foot around the medial aspect and great toe joint with a full-thickness necrotic ulceration with exposed bone at the right head of the first metatarsal.    Assessment/Plan: Assessment: 1.  Osteomyelitis left first metatarsal. 2.  Diabetes with associated neuropathy. 3.   Peripheral vascular disease. 4.  Full-thickness ulceration with cellulitis left foot.  Plan: Gauze dressing reapplied to the left foot.  Discussed with the patient and his sister the need for amputation of the great toe with debridement of the infected bone in the left foot.  Discussed trying to have this done tomorrow or Saturday as I did speak with Dr. Delana Meyer earlier and he will most likely not be able to perform revascularization until next week.  I did also discussed with the patient and his sister the option of proceeding with a transmetatarsal amputation following his revascularization.  Patient is concerned about losing his entire forefoot so we did discuss the possibility of observing to see how he does following his initial debridement with the understanding that at some point in the future he still may need a transmetatarsal amputation.  Questions were invited and answered.  At this point we will look at scheduling in OR availability and plan for surgery in the next couple of days.  Durward Fortes 03/15/2017, 1:12 PM

## 2017-03-15 NOTE — Progress Notes (Signed)
Family Meeting Note  Advance Directive:yes  Today a meeting took place with the Patient, sister at bedside    The following clinical team members were present during this meeting:MD  The following were discussed:Patient's diagnosis: Acute left foot ulcer, peripheral arterial disease, insulin requiring diabetes mellitus, hypertension, hyperlipidemia, chronic kidney disease stage III, coronary artery disease, Patient's progosis: Unable to determine and Goals for treatment: Full Code, Sister Narda Rutherford is the healthcare power of attorney  Additional follow-up to be provided: Vascular surgery, podiatry, hospitalist  Time spent during discussion:18 min  Nicholes Mango, MD

## 2017-03-15 NOTE — Progress Notes (Signed)
MRN : [id number]  Benjamin Davidson is a 67 y.o. (07/14/1950) male who presents with chief complaint of  Chief Complaint  Patient presents with  . Follow-up    ref [place] for PVD  .  History of Present Illness: patient with clinical osteomyelitis of left great toe.    Current Meds  Medication Sig  . amoxicillin-clavulanate (AUGMENTIN) 875-125 MG tablet Take 1 tablet by mouth 2 (two) times daily for 10 days.  Marland Kitchen aspirin EC 325 MG EC tablet Take 325 mg by mouth daily.    . carvedilol (COREG) 6.25 MG tablet TAKE 1 TABLET BY MOUTH TWICE (2) DAILY  . cyclobenzaprine (FLEXERIL) 5 MG tablet Take 1 tablet (5 mg total) by mouth 2 (two) times daily as needed for muscle spasms.  . furosemide (LASIX) 20 MG tablet TAKE 1 TABLET BY MOUTH ONCE A DAY ** MAYTAKE ONE ADDITIONAL TABLET IFINCREASED SWELLING  . gabapentin (NEURONTIN) 300 MG capsule Take 1 capsule (300 mg total) by mouth 2 (two) times daily.  . Insulin Pen Needle (NOVOFINE 31) 31G X 6 MM MISC by Does not apply route.    . Insulin Syringe-Needle U-100 (PRODIGY INSULIN SYRINGE) 31G X 5/16" 0.5 ML MISC Inject 1 Syringe into the skin 2 (two) times daily.  Marland Kitchen levofloxacin (LEVAQUIN) 500 MG tablet Take 1 tablet (500 mg total) by mouth daily.  Marland Kitchen losartan (COZAAR) 25 MG tablet Take 1 tablet (25 mg total) by mouth daily.  Marland Kitchen NOVOLIN 70/30 (70-30) 100 UNIT/ML injection INJECT 35 UNITS SUBCUTANEOUSLY TWICE A DAY AS DIRECTED  . NOVOLIN R 100 UNIT/ML injection as needed.   . rosuvastatin (CRESTOR) 20 MG tablet Take 1 tablet (20 mg total) by mouth daily.  . sertraline (ZOLOFT) 100 MG tablet TAKE 1 TABLET BY MOUTH ONCE DAILY  . warfarin (COUMADIN) 5 MG tablet TAKE AS DIRECTED BY ANTICOAGULATION CLINIC    Past Medical History:  Diagnosis Date  . Anemia   . Asthma    childhood  . CHF (congestive heart failure) ([place])   . Coronary artery disease   . DM (diabetes mellitus), type 2, uncontrolled, periph vascular complic ([place])    Type II  . Edema    feet/legs  . Hypertension   . Left leg DVT ([place])   . MDD (recurrent major depressive disorder) in remission ([place])   . Peripheral vascular disease (H[facility])    Left toe amputations. Dr Lucky Cowboy  . Pure hypercholesterolemia   . Shortness of breath dyspnea   . Syncope    resolved    Past Surgical History:  Procedure Laterality Date  . CARDIAC CATHETERIZATION    . CATARACT EXTRACTION W/PHACO Left 06/03/2015   Procedure: CATARACT EXTRACTION PHACO AND INTRAOCULAR LENS PLACEMENT (IOC);  Surgeon: Eulogio Bear, MD;  Location: [facility] ORS;  Service: Ophthalmology;  Laterality: Left;  Lot# [id number] H [place]: 1.06 AP%: 5.5 CDE: 3.69  . CORONARY ARTERY BYPASS GRAFT    . EYE SURGERY    . PARS PLANA VITRECTOMY Right 02/17/2015   Procedure: PARS PLANA VITRECTOMY WITH 25 GAUGE, endolaser, right eye;  Surgeon: Milus Height, MD;  Location: [facility] ORS;  Service: Ophthalmology;  Laterality: Right;  fluid pack lot # [id number] H endolaser: 200w, [id number]  . PERCUTANEOUS STENT INTERVENTION     right leg  . PERIPHERALLY INSERTED CENTRAL CATHETER INSERTION  2013  . TOE AMPUTATION     left 5th, right 4th and 5th    Social History Social History   Tobacco Use  . Smoking status: Never  Smoker  . Smokeless tobacco: Never Used  Substance Use Topics  . Alcohol use: No    Alcohol/week: 0.0 oz  . Drug use: No    Family History Family History  Problem Relation Age of Onset  . Heart disease Sister   . Hyperlipidemia Brother     Allergies  Allergen Reactions  . Lisinopril     Cough Flu like symptoms      REVIEW OF SYSTEMS (Negative unless checked)  Constitutional: [] Weight loss  [] Fever  [] Chills Cardiac: [] Chest pain   [] Chest pressure   [] Palpitations   [] Shortness of breath when laying flat   [] Shortness of breath with exertion. Vascular:  [x] Pain in legs with walking   [x] Pain in legs at rest  [] History of DVT   [] Phlebitis   [] Swelling in legs   [] Varicose veins   [x] Non-healing ulcers Pulmonary:    [] Uses home oxygen   [] Productive cough   [] Hemoptysis   [] Wheeze  [] COPD   [] Asthma Neurologic:  [] Dizziness   [] Seizures   [] History of stroke   [] History of TIA  [] Aphasia   [] Vissual changes   [] Weakness or numbness in arm   [] Weakness or numbness in leg Musculoskeletal:   [] Joint swelling   [] Joint pain   [] Low back pain Hematologic:  [] Easy bruising  [] Easy bleeding   [] Hypercoagulable state   [] Anemic Gastrointestinal:  [] Diarrhea   [] Vomiting  [] Gastroesophageal reflux/heartburn   [] Difficulty swallowing. Genitourinary:  [] Chronic kidney disease   [] Difficult urination  [] Frequent urination   [] Blood in urine Skin:  [] Rashes   [x] Ulcers  Psychological:  [] History of anxiety   []  History of major depression.  Physical Examination  Vitals:   03/15/17 0856  BP: (!) 145/81  Pulse: (!) 58  Resp: 16  Weight: 250 lb 6.4 oz (113.6 kg)  Height: 5\' 8"  (1.727 m)   Body mass index is 38.07 kg/m. Gen: WD/WN, NAD Head: [place]/AT, No temporalis wasting.  Ear/Nose/Throat: Hearing grossly intact, nares w/o erythema or drainage Eyes: PER, EOMI, sclera nonicteric.  Neck: Supple, no large masses.   Pulmonary:  Good air movement, no audible wheezing bilaterally, no use of accessory muscles.  Cardiac: RRR, no JVD Vascular:  Ulceration of the first ray left foot with palpable bone in the ulcer bed surrounding cellulitis Vessel Right Left  Radial Palpable Palpable  PT Not Palpable Not Palpable  DP Not Palpable Not Palpable  Gastrointestinal: Non-distended. No guarding/no peritoneal signs.  Musculoskeletal: M/S 5/5 throughout.  No deformity or atrophy.  Neurologic: CN 2-12 intact. Symmetrical.  Speech is fluent. Motor exam as listed above. Psychiatric: Judgment intact, Mood & affect appropriate for pt's clinical situation. Dermatologic: + rashes with ulcers noted.  + changes consistent with cellulitis. Lymph : No lichenification or skin changes of chronic lymphedema.  CBC Lab Results  Component  Value Date   WBC 6.4 02/06/2017   HGB 12.4 (L) 02/06/2017   HCT 38.4 (L) 02/06/2017   MCV 83.8 02/06/2017   PLT 249.0 02/06/2017    BMET    Component Value Date/Time   NA 139 02/06/2017 1029   NA 141 08/13/2011 0626   K 4.3 02/06/2017 1029   K 3.9 08/13/2011 0626   CL 103 02/06/2017 1029   CL 108 (H) 08/13/2011 0626   CO2 29 02/06/2017 1029   CO2 23 08/13/2011 0626   GLUCOSE 141 (H) 02/06/2017 1029   GLUCOSE 135 (H) 08/13/2011 0626   BUN 16 02/06/2017 1029   BUN 10 08/13/2011 0626   CREATININE  0.94 02/06/2017 1029   CREATININE 1.10 08/13/2011 0626   CALCIUM 9.2 02/06/2017 1029   CALCIUM 8.0 (L) 08/13/2011 0626   GFRNONAA >60 08/13/2011 0626   GFRAA >60 08/13/2011 0626   CrCl cannot be calculated (Patient's most recent lab result is older than the maximum 21 days allowed.).  COAG Lab Results  Component Value Date   INR 1.6 03/13/2017   INR 2.9 02/06/2017   INR 2.3 12/19/2016    Radiology Dg Foot 2 Views Left  Result Date: 03/09/2017 CLINICAL DATA:  Ulcer on left foot near great toe for 1 week. EXAM: LEFT FOOT - 2 VIEW COMPARISON:  10/24/2006 FINDINGS: Fifth transmetatarsal amputation since prior. Interval fourth proximal metatarsal shaft fracture that appears healed. There also posttraumatic deformity at the base of the second proximal phalanx. There is an ulcer over the first MTP medially. Underlying bony spur appears irregular which could be from bone destruction or overlapping soft tissue gas. PIP joint destruction involving the fourth digit, of indeterminate chronicity. IMPRESSION: 1. Ulcer over a first MTP head spur which is nearly exposed. This spur appears irregular, which could be osteomyelitis or overlapping gas in the ulcer. 2. Fourth PIP joint destruction of indeterminate chronicity. 3. No tracking soft tissue gas or opaque foreign body. Electronically Signed   By: Monte Fantasia M.D.   On: 03/09/2017 08:52     Assessment/Plan 1. Cellulitis and abscess of  foot Will direct admit to the hospital  2. Peripheral vascular disease ([place]) Will direct admit to the hospital     Hortencia Pilar, MD  03/15/2017 10:27 AM

## 2017-03-15 NOTE — Consult Note (Signed)
Pharmacy Antibiotic Note  Benjamin Davidson is a 67 y.o. male admitted on 03/15/2017 with cellulitis.  Pharmacy has been consulted for Zosyn dosing.  Plan: Start Zosyn 3.375 IV EI every 8 hours.     No data recorded.  Recent Labs  Lab 03/15/17 1125  WBC 10.8*  CREATININE 1.13    Estimated Creatinine Clearance: 79.7 mL/min (by C-G formula based on SCr of 1.13 mg/dL).    Allergies  Allergen Reactions  . Lisinopril     Cough Flu like symptoms     Antimicrobials this admission: 2/14 Zosyn >>   Dose adjustments this admission:  Microbiology results:  Thank you for allowing pharmacy to be a part of this patient's care.  Pernell Dupre, PharmD, BCPS Clinical Pharmacist 03/15/2017 12:36 PM

## 2017-03-15 NOTE — Consult Note (Signed)
ANTICOAGULATION CONSULT NOTE - Initial Consult  Pharmacy Consult for Heparin dosing and monitoring  Indication: VTE prophylaxis, PMH of DVT    Patient Measurements: Heparin Dosing Weight: 86kg  Vital Signs: BP: 145/81 (02/14 0856) Pulse Rate: 58 (02/14 0856)  Labs: Recent Labs    03/13/17 03/15/17 1125  HGB  --  10.3*  HCT  --  31.1*  PLT  --  448*  APTT  --  39*  LABPROT  --  19.8*  INR 1.6 1.70  CREATININE  --  1.13    Estimated Creatinine Clearance: 79.7 mL/min (by C-G formula based on SCr of 1.13 mg/dL).    Assessment: Pharmacy consulted for heparin dosing and monitoring in 67 yo male with PMH of DVT. Patient was taking warfarin 5mg  QD PTA. INR was subtherapeutic at 1.7 on admission.   Goal of Therapy:  Heparin level 0.3-0.7 units/ml Monitor platelets by anticoagulation protocol: Yes   Plan:  Warfarin has been discontinued  No bolus per MD request.  Heparin Dosing Weight: 86kg Start heparin infusion at 1450 units/hr Check anti-Xa level in 6 hours and daily while on heparin Continue to monitor H&H and platelets  Pernell Dupre, PharmD, BCPS Clinical Pharmacist 03/15/2017 1:43 PM

## 2017-03-15 NOTE — Progress Notes (Signed)
Pharmacy Antibiotic Note  Benjamin Davidson is a 67 y.o. male admitted on 03/15/2017 with surgical prophylaxis.  Pharmacy has been consulted for vanc dosing.  Plan: will give patient vanc 1.25g IV x 1     Temp (24hrs), Avg:98.8 F (37.1 C), Min:98.5 F (36.9 C), Max:98.9 F (37.2 C)  Recent Labs  Lab 03/15/17 1125  WBC 10.8*  CREATININE 1.13    Estimated Creatinine Clearance: 79.7 mL/min (by C-G formula based on SCr of 1.13 mg/dL).    Allergies  Allergen Reactions  . Lisinopril     Cough Flu like symptoms      Thank you for allowing pharmacy to be a part of this patient's care.  Tobie Lords, PharmD, BCPS Clinical Pharmacist 03/15/2017

## 2017-03-16 LAB — GLUCOSE, CAPILLARY
GLUCOSE-CAPILLARY: 181 mg/dL — AB (ref 65–99)
GLUCOSE-CAPILLARY: 151 mg/dL — AB (ref 65–99)
GLUCOSE-CAPILLARY: 79 mg/dL (ref 65–99)
Glucose-Capillary: 114 mg/dL — ABNORMAL HIGH (ref 65–99)

## 2017-03-16 LAB — CBC
HCT: 31 % — ABNORMAL LOW (ref 40.0–52.0)
Hemoglobin: 10 g/dL — ABNORMAL LOW (ref 13.0–18.0)
MCH: 26 pg (ref 26.0–34.0)
MCHC: 32.4 g/dL (ref 32.0–36.0)
MCV: 80.4 fL (ref 80.0–100.0)
Platelets: 393 10*3/uL (ref 150–440)
RBC: 3.85 MIL/uL — ABNORMAL LOW (ref 4.40–5.90)
RDW: 14.6 % — AB (ref 11.5–14.5)
WBC: 9.2 10*3/uL (ref 3.8–10.6)

## 2017-03-16 LAB — BASIC METABOLIC PANEL
ANION GAP: 7 (ref 5–15)
BUN: 25 mg/dL — ABNORMAL HIGH (ref 6–20)
CO2: 25 mmol/L (ref 22–32)
Calcium: 8.1 mg/dL — ABNORMAL LOW (ref 8.9–10.3)
Chloride: 104 mmol/L (ref 101–111)
Creatinine, Ser: 1.18 mg/dL (ref 0.61–1.24)
GFR calc non Af Amer: >60 mL/min (ref >60)
GLUCOSE: 240 mg/dL — AB (ref 65–99)
Potassium: 3.8 mmol/L (ref 3.5–5.1)
Sodium: 136 mmol/L (ref 135–145)

## 2017-03-16 LAB — PROTIME-INR
INR: 1.77
Prothrombin Time: 20.5 seconds — ABNORMAL HIGH (ref 11.4–15.2)

## 2017-03-16 LAB — HIV ANTIBODY (ROUTINE TESTING W REFLEX): HIV SCREEN 4TH GENERATION: NONREACTIVE

## 2017-03-16 LAB — MAGNESIUM: Magnesium: 2.2 mg/dL (ref 1.7–2.4)

## 2017-03-16 LAB — HEPARIN LEVEL (UNFRACTIONATED)
HEPARIN UNFRACTIONATED: 0.39 [IU]/mL (ref 0.30–0.70)
HEPARIN UNFRACTIONATED: 0.61 [IU]/mL (ref 0.30–0.70)

## 2017-03-16 MED ORDER — VANCOMYCIN HCL 10 G IV SOLR
1250.0000 mg | Freq: Two times a day (BID) | INTRAVENOUS | Status: DC
  Administered 2017-03-16 – 2017-03-20 (×8): 1250 mg via INTRAVENOUS
  Filled 2017-03-16 (×10): qty 1250

## 2017-03-16 MED ORDER — SODIUM CHLORIDE 1 G PO TABS
1.0000 g | ORAL_TABLET | Freq: Once | ORAL | Status: AC
  Administered 2017-03-16: 1 g via ORAL
  Filled 2017-03-16 (×2): qty 1

## 2017-03-16 MED ORDER — SODIUM CHLORIDE 0.9 % IV SOLN
INTRAVENOUS | Status: DC
  Administered 2017-03-17 – 2017-03-19 (×3): via INTRAVENOUS

## 2017-03-16 NOTE — Consult Note (Signed)
ANTICOAGULATION CONSULT NOTE - Initial Consult  Pharmacy Consult for Heparin dosing and monitoring  Indication: VTE prophylaxis, PMH of DVT    Patient Measurements: Heparin Dosing Weight: 86kg  Vital Signs: Temp: 97.4 F (36.3 C) (02/15 0505) Temp Source: Oral (02/15 0505) BP: 140/75 (02/15 0505) Pulse Rate: 68 (02/15 0505)  Labs: Recent Labs    03/15/17 1125 03/15/17 2140 03/16/17 0333  HGB 10.3*  --  10.0*  HCT 31.1*  --  31.0*  PLT 448*  --  393  APTT 39*  --   --   LABPROT 19.8*  --  20.5*  INR 1.70  --  1.77  HEPARINUNFRC  --  0.17* 0.39  CREATININE 1.13  --  1.18    Estimated Creatinine Clearance: 76.7 mL/min (by C-G formula based on SCr of 1.18 mg/dL).    Assessment: Pharmacy consulted for heparin dosing and monitoring in 67 yo male with PMH of DVT. Patient was taking warfarin 5mg  QD PTA. INR was subtherapeutic at 1.7 on admission.   Goal of Therapy:  Heparin level 0.3-0.7 units/ml Monitor platelets by anticoagulation protocol: Yes   Plan:  Warfarin has been discontinued  No bolus per MD request.  Heparin Dosing Weight: 86kg Start heparin infusion at 1450 units/hr Check anti-Xa level in 6 hours and daily while on heparin Continue to monitor H&H and platelets  02/14 @ 2140 HL 0.17 subtherapeutic. Will increase rate to 1650 units/hr and will recheck HL @ 0300, no bolus per MD.  02/15 @ 0300 HL 0.39 therapeutic. Will continue current rate and will recheck HL @ 0900, CBC stable.  Tobie Lords, PharmD, BCPS Clinical Pharmacist 03/16/2017 6:01 AM

## 2017-03-16 NOTE — Anesthesia Preprocedure Evaluation (Addendum)
Anesthesia Evaluation  Patient identified by MRN, date of birth, ID band  Airway Mallampati: II  TM Distance: >3 FB     Dental  (+) Chipped, Caps   Pulmonary shortness of breath and with exertion, asthma ,    Pulmonary exam normal        Cardiovascular hypertension, + CAD, + Peripheral Vascular Disease and +CHF  Normal cardiovascular exam     Neuro/Psych PSYCHIATRIC DISORDERS Depression  Neuromuscular disease    GI/Hepatic   Endo/Other  diabetes, Type 2, Oral Hypoglycemic Agents, Insulin Dependent  Renal/GU Renal disease  negative genitourinary   Musculoskeletal   Abdominal Normal abdominal exam  (+)   Peds  Hematology  (+) anemia ,   Anesthesia Other Findings Past Medical History: No date: Anemia No date: Asthma     Comment:  childhood No date: CHF (congestive heart failure) (HCC) No date: Coronary artery disease No date: DM (diabetes mellitus), type 2, uncontrolled, periph  vascular complic (HCC)     Comment:  Type II No date: Edema     Comment:  feet/legs No date: Hypertension No date: Left leg DVT (HCC) No date: MDD (recurrent major depressive disorder) in remission ([place]) No date: Peripheral vascular disease (HCC)     Comment:  Left toe amputations. Dr Lucky Cowboy No date: Pure hypercholesterolemia No date: Shortness of breath dyspnea No date: Syncope     Comment:  resolved BMI    Body Mass Index:  38.41 kg/m      Reproductive/Obstetrics                           Anesthesia Physical Anesthesia Plan  ASA: III and emergent  Anesthesia Plan: General   Post-op Pain Management:    Induction:   PONV Risk Score and Plan:   Airway Management Planned: Oral ETT and LMA  Additional Equipment:   Intra-op Plan:   Post-operative Plan: Extubation in OR  Informed Consent:   Dental advisory given  Plan Discussed with: CRNA and Surgeon  Anesthesia Plan Comments:         Anesthesia Quick Evaluation                                   Anesthesia Evaluation  Patient identified by MRN, date of birth, ID band Patient awake    Reviewed: Allergy & Precautions, H&P , NPO status , Patient's Chart, lab work & pertinent test results, reviewed documented beta blocker date and time   History of Anesthesia Complications Negative for: history of anesthetic complications  Airway Mallampati: IV  TM Distance: >3 FB Neck ROM: full    Dental no notable dental hx. (+) Caps   Pulmonary neg shortness of breath, asthma , neg sleep apnea, neg COPD, neg recent URI,    Pulmonary exam normal breath sounds clear to auscultation       Cardiovascular Exercise Tolerance: Good hypertension, On Medications and On Home Beta Blockers (-) angina+ CAD, + Past MI, + CABG (2 vessels in 2007), + Peripheral Vascular Disease and +CHF  (-) Cardiac Stents Normal cardiovascular exam(-) dysrhythmias (-) Valvular Problems/Murmurs Rhythm:regular Rate:Normal     Neuro/Psych PSYCHIATRIC DISORDERS (Depression)  Neuromuscular disease (Peripheral neuropathy)    GI/Hepatic negative GI ROS, Neg liver ROS,   Endo/Other  diabetes, Insulin DependentMorbid obesity  Renal/GU CRFRenal disease  negative genitourinary   Musculoskeletal   Abdominal   Peds  Hematology negative hematology ROS (+)   Anesthesia Other Findings Past Medical History:   Pure hypercholesterolemia                                    MDD (recurrent major depressive disorder) in r*              Left leg DVT ([place])                                           Coronary artery disease                                      DM (diabetes mellitus), type 2, uncontrolled, *                Comment:Type II   Hypertension                                                 CHF (congestive heart failure) ([place])                         Peripheral vascular disease (HCC)                              Comment:Left toe  amputations. Dr Lucky Cowboy   Shortness of breath dyspnea                                  Asthma                                                         Comment:childhood   Syncope                                                        Comment:resolved   Anemia                                                       Edema                                                          Comment:feet/legs   Reproductive/Obstetrics negative OB ROS  Anesthesia Physical Anesthesia Plan  ASA: IV  Anesthesia Plan: MAC   Post-op Pain Management:    Induction:   Airway Management Planned:   Additional Equipment:   Intra-op Plan:   Post-operative Plan:   Informed Consent: I have reviewed the patients History and Physical, chart, labs and discussed the procedure including the risks, benefits and alternatives for the proposed anesthesia with the patient or authorized representative who has indicated his/her understanding and acceptance.   Dental Advisory Given  Plan Discussed with: Anesthesiologist, CRNA and Surgeon  Anesthesia Plan Comments:         Anesthesia Quick Evaluation

## 2017-03-16 NOTE — Progress Notes (Signed)
Subjective/Chief Complaint: Patient seen.  No complaints.   Objective: Vital signs in last 24 hours: Temp:  [97.4 F (36.3 C)-98.9 F (37.2 C)] 97.4 F (36.3 C) (02/15 0505) Pulse Rate:  [68-77] 68 (02/15 0505) Resp:  [15-16] 15 (02/15 0505) BP: (122-140)/(42-75) 140/75 (02/15 0505) SpO2:  [95 %-97 %] 95 % (02/15 0505) Weight:  [114.6 kg (252 lb 9.6 oz)] 114.6 kg (252 lb 9.6 oz) (02/15 0505) Last BM Date: 03/15/17  Intake/Output from previous day: 02/14 0701 - 02/15 0700 In: 1036.4 [P.O.:480; I.V.:456.4; IV Piggyback:100] Out: 900 [Urine:900] Intake/Output this shift: Total I/O In: 1406 [P.O.:240; I.V.:1166] Out: 800 [Urine:800]  Minimal drainage on the bandage.  Still significant necrosis over the medial aspect of the great toe joint.  Still some cellulitis.  Lab Results:  Recent Labs    03/15/17 1125 03/16/17 0333  WBC 10.8* 9.2  HGB 10.3* 10.0*  HCT 31.1* 31.0*  PLT 448* 393   BMET Recent Labs    03/15/17 1125 03/16/17 0333  NA 137 136  K 3.8 3.8  CL 103 104  CO2 24 25  GLUCOSE 227* 240*  BUN 23* 25*  CREATININE 1.13 1.18  CALCIUM 8.7* 8.1*   PT/INR Recent Labs    03/15/17 1125 03/16/17 0333  LABPROT 19.8* 20.5*  INR 1.70 1.77   ABG No results for input(s): PHART, HCO3 in the last 72 hours.  Invalid input(s): PCO2, PO2  Studies/Results: Dg Foot 2 Views Left  Result Date: 03/15/2017 CLINICAL DATA:  Evaluate for osteomyelitis. EXAM: LEFT FOOT - 2 VIEW COMPARISON:  Left foot x-rays dated March 08, 2017. FINDINGS: Ulceration along the medial aspect of the first MTP joint again noted. New fracture of the first metatarsal head with 7 mm medial displacement. There is new osteolysis and irregularity of the base of the first proximal phalanx. 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 Unchanged healed fourth proximal metatarsal shaft fracture. Unchanged fourth PIP joint destruction. No subcutaneous emphysema.  IMPRESSION: 1. Osteomyelitis about the first MTP joint with new displaced pathologic fracture of the first metatarsal head. Electronically Signed   By: Titus Dubin M.D.   On: 03/15/2017 11:36    Anti-infectives: Anti-infectives (From admission, onward)   Start     Dose/Rate Route Frequency Ordered Stop   03/16/17 1030  vancomycin (VANCOCIN) 1,250 mg in sodium chloride 0.9 % 250 mL IVPB     1,250 mg 166.7 mL/hr over 90 Minutes Intravenous Every 12 hours 03/16/17 0942     03/16/17 0000  vancomycin (VANCOCIN) 1,250 mg in sodium chloride 0.9 % 250 mL IVPB     1,250 mg 166.7 mL/hr over 90 Minutes Intravenous  Once 03/15/17 2350 03/16/17 0223   03/15/17 1245  piperacillin-tazobactam (ZOSYN) IVPB 3.375 g     3.375 g 12.5 mL/hr over 240 Minutes Intravenous Every 8 hours 03/15/17 1235        Assessment/Plan: s/p Procedure(s): FIRST RAY RESECTION LEFT FOOT (Left) Assessment: Ulceration with osteomyelitis left great toe joint.   Plan: The wound was rewrapped.  Discussed with the patient surgery for amputation of the left great toe with partial ray resection which we will performed tomorrow morning.  Questions were invited and answered.  Patient understands that no guarantees can be given as to outcome due to his diabetes or vascular status or infection.  We will obtain a consent form for amputation of the left great toe with partial first ray resection.  N.p.o. after midnight.  Plan for surgery tomorrow morning.  LOS: 1 day    Benjamin Davidson 03/16/2017

## 2017-03-16 NOTE — H&P (View-Only) (Signed)
Subjective/Chief Complaint: Patient seen.  No complaints.   Objective: Vital signs in last 24 hours: Temp:  [97.4 F (36.3 C)-98.9 F (37.2 C)] 97.4 F (36.3 C) (02/15 0505) Pulse Rate:  [68-77] 68 (02/15 0505) Resp:  [15-16] 15 (02/15 0505) BP: (122-140)/(42-75) 140/75 (02/15 0505) SpO2:  [95 %-97 %] 95 % (02/15 0505) Weight:  [114.6 kg (252 lb 9.6 oz)] 114.6 kg (252 lb 9.6 oz) (02/15 0505) Last BM Date: 03/15/17  Intake/Output from previous day: 02/14 0701 - 02/15 0700 In: 1036.4 [P.O.:480; I.V.:456.4; IV Piggyback:100] Out: 900 [Urine:900] Intake/Output this shift: Total I/O In: 1406 [P.O.:240; I.V.:1166] Out: 800 [Urine:800]  Minimal drainage on the bandage.  Still significant necrosis over the medial aspect of the great toe joint.  Still some cellulitis.  Lab Results:  Recent Labs    03/15/17 1125 03/16/17 0333  WBC 10.8* 9.2  HGB 10.3* 10.0*  HCT 31.1* 31.0*  PLT 448* 393   BMET Recent Labs    03/15/17 1125 03/16/17 0333  NA 137 136  K 3.8 3.8  CL 103 104  CO2 24 25  GLUCOSE 227* 240*  BUN 23* 25*  CREATININE 1.13 1.18  CALCIUM 8.7* 8.1*   PT/INR Recent Labs    03/15/17 1125 03/16/17 0333  LABPROT 19.8* 20.5*  INR 1.70 1.77   ABG No results for input(s): PHART, HCO3 in the last 72 hours.  Invalid input(s): PCO2, PO2  Studies/Results: Dg Foot 2 Views Left  Result Date: 03/15/2017 CLINICAL DATA:  Evaluate for osteomyelitis. EXAM: LEFT FOOT - 2 VIEW COMPARISON:  Left foot x-rays dated March 08, 2017. FINDINGS: Ulceration along the medial aspect of the first MTP joint again noted. New fracture of the first metatarsal head with 7 mm medial displacement. There is new osteolysis and irregularity of the base of the first proximal phalanx. 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 Unchanged healed fourth proximal metatarsal shaft fracture. Unchanged fourth PIP joint destruction. No subcutaneous emphysema.  IMPRESSION: 1. Osteomyelitis about the first MTP joint with new displaced pathologic fracture of the first metatarsal head. Electronically Signed   By: Titus Dubin M.D.   On: 03/15/2017 11:36    Anti-infectives: Anti-infectives (From admission, onward)   Start     Dose/Rate Route Frequency Ordered Stop   03/16/17 1030  vancomycin (VANCOCIN) 1,250 mg in sodium chloride 0.9 % 250 mL IVPB     1,250 mg 166.7 mL/hr over 90 Minutes Intravenous Every 12 hours 03/16/17 0942     03/16/17 0000  vancomycin (VANCOCIN) 1,250 mg in sodium chloride 0.9 % 250 mL IVPB     1,250 mg 166.7 mL/hr over 90 Minutes Intravenous  Once 03/15/17 2350 03/16/17 0223   03/15/17 1245  piperacillin-tazobactam (ZOSYN) IVPB 3.375 g     3.375 g 12.5 mL/hr over 240 Minutes Intravenous Every 8 hours 03/15/17 1235        Assessment/Plan: s/p Procedure(s): FIRST RAY RESECTION LEFT FOOT (Left) Assessment: Ulceration with osteomyelitis left great toe joint.   Plan: The wound was rewrapped.  Discussed with the patient surgery for amputation of the left great toe with partial ray resection which we will performed tomorrow morning.  Questions were invited and answered.  Patient understands that no guarantees can be given as to outcome due to his diabetes or vascular status or infection.  We will obtain a consent form for amputation of the left great toe with partial first ray resection.  N.p.o. after midnight.  Plan for surgery tomorrow morning.  LOS: 1 day    Durward Fortes 03/16/2017

## 2017-03-16 NOTE — Progress Notes (Signed)
[place] at [facility]                                                                                                                                                                                  Patient Demographics   Benjamin Davidson, is a 67 y.o. male, DOB - [birth year], VHQ:[id number]  Admit date - 03/15/2017   Admitting Physician Katha Cabal, MD  Outpatient Primary MD for the patient is Venia Carbon, MD   LOS - 1  Subjective: Patient's with peripheral arterial disease admitted with acute left foot ulcer He states that he is feeling well and denies any complaints    Review of Systems:   CONSTITUTIONAL: No documented fever. No fatigue, weakness. No weight gain, no weight loss.  EYES: No blurry or double vision.  ENT: No tinnitus. No postnasal drip. No redness of the oropharynx.  RESPIRATORY: No cough, no wheeze, no hemoptysis. No dyspnea.  CARDIOVASCULAR: No chest pain. No orthopnea. No palpitations. No syncope.  GASTROINTESTINAL: No nausea, no vomiting or diarrhea. No abdominal pain. No melena or hematochezia.  GENITOURINARY: No dysuria or hematuria.  ENDOCRINE: No polyuria or nocturia. No heat or cold intolerance.  HEMATOLOGY: No anemia. No bruising. No bleeding.  INTEGUMENTARY: No rashes. No lesions.  MUSCULOSKELETAL: Left foot ulcer NEUROLOGIC: No numbness, tingling, or ataxia. No seizure-type activity.  PSYCHIATRIC: No anxiety. No insomnia. No ADD.    Vitals:   Vitals:   03/15/17 1130 03/15/17 1707 03/15/17 2136 03/16/17 0505  BP: 128/76 (!) 122/42 134/69 140/75  Pulse: 62 77 74 68  Resp: 20  16 15   Temp: 98.9 F (37.2 C) 98.9 F (37.2 C) 98.5 F (36.9 C) (!) 97.4 F (36.3 C)  TempSrc: Oral Oral Oral Oral  SpO2: 99% 97% 96% 95%  Weight:    252 lb 9.6 oz (114.6 kg)    Wt Readings from Last 3 Encounters:  03/16/17 252 lb 9.6 oz (114.6 kg)  03/15/17 250 lb 6.4 oz (113.6 kg)  03/13/17 252 lb 8 oz (114.5 kg)      Intake/Output Summary (Last 24 hours) at 03/16/2017 1326 Last data filed at 03/16/2017 1207 Gross per 24 hour  Intake 2442.36 ml  Output 1700 ml  Net 742.36 ml    Physical Exam:   GENERAL: Pleasant-appearing in no apparent distress.  HEAD, EYES, EARS, NOSE AND THROAT: Atraumatic, normocephalic. Extraocular muscles are intact. Pupils equal and reactive to light. Sclerae anicteric. No conjunctival injection. No oro-pharyngeal erythema.  NECK: Supple. There is no jugular venous distention. No bruits, no lymphadenopathy, no thyromegaly.  HEART: Regular rate and rhythm,. No murmurs, no rubs, no clicks.  LUNGS: Clear to  auscultation bilaterally. No rales or rhonchi. No wheezes.  ABDOMEN: Soft, flat, nontender, nondistended. Has good bowel sounds. No hepatosplenomegaly appreciated.  EXTREMITIES: left foot dressing in place NEUROLOGIC: The patient is alert, awake, and oriented x3 with no focal motor or sensory deficits appreciated bilaterally.  SKIN: Moist and warm with no rashes appreciated.  Psych: Not anxious, depressed LN: No inguinal LN enlargement    Antibiotics   Anti-infectives (From admission, onward)   Start     Dose/Rate Route Frequency Ordered Stop   03/16/17 1030  vancomycin (VANCOCIN) 1,250 mg in sodium chloride 0.9 % 250 mL IVPB     1,250 mg 166.7 mL/hr over 90 Minutes Intravenous Every 12 hours 03/16/17 0942     03/16/17 0000  vancomycin (VANCOCIN) 1,250 mg in sodium chloride 0.9 % 250 mL IVPB     1,250 mg 166.7 mL/hr over 90 Minutes Intravenous  Once 03/15/17 2350 03/16/17 0223   03/15/17 1245  piperacillin-tazobactam (ZOSYN) IVPB 3.375 g     3.375 g 12.5 mL/hr over 240 Minutes Intravenous Every 8 hours 03/15/17 1235        Medications   Scheduled Meds: . aspirin EC  81 mg Oral Daily  . carvedilol  6.25 mg Oral BID WC  . furosemide  20 mg Oral Daily  . gabapentin  300 mg Oral BID  . insulin aspart  0-15 Units Subcutaneous TID WC  . insulin aspart  0-5  Units Subcutaneous QHS  . insulin aspart  8 Units Subcutaneous TID WC  . insulin detemir  20 Units Subcutaneous BID  . losartan  25 mg Oral Daily  . rosuvastatin  20 mg Oral Daily  . sertraline  100 mg Oral Daily   Continuous Infusions: . sodium chloride 75 mL/hr at 03/15/17 1831  . heparin 1,650 Units/hr (03/16/17 0458)  . piperacillin-tazobactam (ZOSYN)  IV Stopped (03/16/17 0654)  . vancomycin 1,250 mg (03/16/17 1200)   PRN Meds:.acetaminophen **OR** acetaminophen, cyclobenzaprine, ondansetron **OR** ondansetron (ZOFRAN) IV, oxyCODONE, oxyCODONE   Data Review:   Micro Results Recent Results (from the past 240 hour(s))  WOUND CULTURE     Status: None   Collection Time: 03/08/17  3:21 PM  Result Value Ref Range Status   MICRO NUMBER: [id number]  Final   SPECIMEN QUALITY: ADEQUATE  Final   SOURCE: WOUND  Final   STATUS: FINAL  Final   GRAM STAIN:   Final    No white blood cells seen No epithelial cells seen Moderate Gram positive cocci in pairs Few Gram negative bacilli   RESULT:   Final    A mix of non-predominating organisms of questionable significance was recovered on culture and not further identified. (Note: Growth did not detect the presence of S. Aureus, beta-hemolytic Streptococci or P. Aeruginosa).    Radiology Reports Dg Foot 2 Views Left  Result Date: 03/15/2017 CLINICAL DATA:  Evaluate for osteomyelitis. EXAM: LEFT FOOT - 2 VIEW COMPARISON:  Left foot x-rays dated March 08, 2017. FINDINGS: Ulceration along the medial aspect of the first MTP joint again noted. New fracture of the first metatarsal head with 7 mm medial displacement. There is new osteolysis and irregularity of the base of the first proximal phalanx. 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 Unchanged healed fourth proximal metatarsal shaft fracture. Unchanged fourth PIP joint destruction. No subcutaneous emphysema. IMPRESSION: 1. Osteomyelitis about the first MTP joint with  new displaced pathologic fracture of the first metatarsal head. Electronically Signed   By: Orville Govern.D.  On: 03/15/2017 11:36   Dg Foot 2 Views Left  Result Date: 03/09/2017 CLINICAL DATA:  Ulcer on left foot near great toe for 1 week. EXAM: LEFT FOOT - 2 VIEW COMPARISON:  10/24/2006 FINDINGS: Fifth transmetatarsal amputation since prior. Interval fourth proximal metatarsal shaft fracture that appears healed. There also posttraumatic deformity at the base of the second proximal phalanx. There is an ulcer over the first MTP medially. Underlying bony spur appears irregular which could be from bone destruction or overlapping soft tissue gas. PIP joint destruction involving the fourth digit, of indeterminate chronicity. IMPRESSION: 1. Ulcer over a first MTP head spur which is nearly exposed. This spur appears irregular, which could be osteomyelitis or overlapping gas in the ulcer. 2. Fourth PIP joint destruction of indeterminate chronicity. 3. No tracking soft tissue gas or opaque foreign body. Electronically Signed   By: Monte Fantasia M.D.   On: 03/09/2017 08:52     CBC Recent Labs  Lab 03/15/17 1125 03/16/17 0333  WBC 10.8* 9.2  HGB 10.3* 10.0*  HCT 31.1* 31.0*  PLT 448* 393  MCV 80.1 80.4  MCH 26.5 26.0  MCHC 33.0 32.4  RDW 14.8* 14.6*    Chemistries  Recent Labs  Lab 03/15/17 1125 03/16/17 0333  NA 137 136  K 3.8 3.8  CL 103 104  CO2 24 25  GLUCOSE 227* 240*  BUN 23* 25*  CREATININE 1.13 1.18  CALCIUM 8.7* 8.1*  MG 1.8 2.2   ------------------------------------------------------------------------------------------------------------------ estimated creatinine clearance is 76.7 mL/min (by C-G formula based on SCr of 1.18 mg/dL). ------------------------------------------------------------------------------------------------------------------ No results for input(s): HGBA1C in the last 72  hours. ------------------------------------------------------------------------------------------------------------------ No results for input(s): CHOL, HDL, LDLCALC, TRIG, CHOLHDL, LDLDIRECT in the last 72 hours. ------------------------------------------------------------------------------------------------------------------ No results for input(s): TSH, T4TOTAL, T3FREE, THYROIDAB in the last 72 hours.  Invalid input(s): FREET3 ------------------------------------------------------------------------------------------------------------------ No results for input(s): VITAMINB12, FOLATE, FERRITIN, TIBC, IRON, RETICCTPCT in the last 72 hours.  Coagulation profile Recent Labs  Lab 03/13/17 03/15/17 1125 03/16/17 0333  INR 1.6 1.70 1.77    No results for input(s): DDIMER in the last 72 hours.  Cardiac Enzymes No results for input(s): CKMB, TROPONINI, MYOGLOBIN in the last 168 hours.  Invalid input(s): CK ------------------------------------------------------------------------------------------------------------------ Invalid input(s): POCBNP    Assessment & Plan  Patient's 67 year old with history of peripheral vascular disease and diabetes admitted with "ulcer and infection   #Insulin-dependent diabetes mellitus Hemoglobin A1c 7.4 Continue Levemir 20 units twice a day  #Acute left foot ulcer Continue Zosyn and vancomycin Podiatry to do debridement vascular to do arteriogram likely next week  #History of left leg DVT Patient is on Coumadin currently on heparin and Coumadin held,    #Essential hypertension blood pressure is stable at this time Continue Coreg and Cozaar  #History of coronary artery disease   patient is a symptomatic. Continue home dose medications aspirin, Coreg, Cozaar and statin       Code Status Orders  (From admission, onward)        Start     Ordered   03/15/17 1053  Full code  Continuous     03/15/17 1055    Code Status History     Date Active Date Inactive Code Status Order ID Comments User Context   This patient has a current code status but no historical code status.           DVT Prophylaxis heparin  Lab Results  Component Value Date   PLT 393 03/16/2017     Time  Spent in minutes   62min Greater than 50% of time spent in care coordination and counseling patient regarding the condition and plan of care.   Dustin Flock M.D on 03/16/2017 at 1:26 PM  Between 7am to 6pm - Pager - [phone]  After 6pm go to [email or web address] - password [email or web address] [facility]   Office  [phone]

## 2017-03-16 NOTE — Progress Notes (Signed)
Pharmacy Antibiotic Note  Aniel Hubble Woollard is a 67 y.o. male admitted on 03/15/2017 with left foot ulcer and need for toe amputation as well as LE revascularization. Patient has been on abx including CTX injection x 1 and Augmentin.  Pharmacy has been consulted for vancomycin dosing. Patient is also ordered Zosyn.   Patient received one dose of vancomycin 1250 mg under a surgical prophylaxis consult  Plan: Ke= 0.068 h-1 Vd= 60.9 L  Vancomycin 1250 mg iv q 12 hours. Will plan on checking a trough with the 5th dose for a goal of 15-20 mcg/ml. Goal trough= 15-20 mcg/ml.   Weight: 252 lb 9.6 oz (114.6 kg)  Temp (24hrs), Avg:98.4 F (36.9 C), Min:97.4 F (36.3 C), Max:98.9 F (37.2 C)  Recent Labs  Lab 03/15/17 1125 03/16/17 0333  WBC 10.8* 9.2  CREATININE 1.13 1.18    Estimated Creatinine Clearance: 76.7 mL/min (by C-G formula based on SCr of 1.18 mg/dL).    Allergies  Allergen Reactions  . Lisinopril     Cough Flu like symptoms     Antimicrobials this admission: Zosyn 2/14 >>  vancomycin 2/15 >>   Dose adjustments this admission:   Microbiology results:  Thank you for allowing pharmacy to be a part of this patient's care.  Ulice Dash D 03/16/2017 9:44 AM

## 2017-03-16 NOTE — Progress Notes (Signed)
Inpatient Diabetes Program Recommendations  AACE/ADA: New Consensus Statement on Inpatient Glycemic Control (2015)  Target Ranges:  Prepandial:   less than 140 mg/dL      Peak postprandial:   less than 180 mg/dL (1-2 hours)      Critically ill patients:  140 - 180 mg/dL   Lab Results  Component Value Date   GLUCAP 181 (H) 03/16/2017   HGBA1C 7.4 (H) 02/06/2017    Review of Glycemic Control  Results for Benjamin Davidson, Benjamin Davidson (MRN [id number]) as of 03/16/2017 08:07  Ref. Range 03/15/2017 13:03 03/15/2017 17:05 03/15/2017 22:28 03/16/2017 07:49  Glucose-Capillary Latest Ref Range: 65 - 99 mg/dL 188 (H) 164 (H) 138 (H) 181 (H)   Diabetes history: Type 2  Outpatient Diabetes medications: Novolin 70/30 35 units bid, Novolin R as needed (sliding scale - ordered by Dr. Rockey Situ) - has used it in the past couple of weeks  Current orders for Inpatient glycemic control: Levemir 20 units bid, Novolog 8 units tid,  Novolog 0-15 units tid, Novolog 0-5 units qhs  Inpatient Diabetes Program Recommendations: Consider decreasing Novolog correction to sensitive 0-9 units tid. Decrease Novolog mealtime to 6 units tid.   Gentry Fitz, RN, BA, MHA, CDE Diabetes Coordinator Inpatient Diabetes Program  [phone] (Team Pager) [phone] ([place]) 03/16/2017 8:15 AM

## 2017-03-16 NOTE — Consult Note (Signed)
[place] for Heparin dosing and monitoring  Indication: PMH of DVT    Patient Measurements: Heparin Dosing Weight: 94 kg  Vital Signs: Temp: 97.4 F (36.3 C) (02/15 0505) Temp Source: Oral (02/15 0505) BP: 140/75 (02/15 0505) Pulse Rate: 68 (02/15 0505)  Labs: Recent Labs    03/15/17 1125 03/15/17 2140 03/16/17 0333 03/16/17 0907  HGB 10.3*  --  10.0*  --   HCT 31.1*  --  31.0*  --   PLT 448*  --  393  --   APTT 39*  --   --   --   LABPROT 19.8*  --  20.5*  --   INR 1.70  --  1.77  --   HEPARINUNFRC  --  0.17* 0.39 0.61  CREATININE 1.13  --  1.18  --     Estimated Creatinine Clearance: 76.7 mL/min (by C-G formula based on SCr of 1.18 mg/dL).    Assessment: Pharmacy consulted for heparin dosing and monitoring in 67 yo male with PMH of DVT. Patient was taking warfarin 5mg  QD PTA. INR was subtherapeutic at 1.7 on admission.   Goal of Therapy:  Heparin level 0.3-0.7 units/ml Monitor platelets by anticoagulation protocol: Yes   Plan:  Heparin level remains within goal range with drip infusing at 1650 units/rh. Will recheck HL/CBC in AM.   Napoleon Form, PharmD, BCPS Clinical Pharmacist 03/16/2017 9:56 AM

## 2017-03-16 NOTE — Progress Notes (Signed)
[facility]  Daily Progress Note   Subjective: Patient eating lunch comfortably.  Patient is without complaint today.  Patient denies any worsening left foot pain.  Denies any fever, nausea vomiting.  Objective: Vitals:   03/15/17 1130 03/15/17 1707 03/15/17 2136 03/16/17 0505  BP: 128/76 (!) 122/42 134/69 140/75  Pulse: 62 77 74 68  Resp: 20  16 15   Temp: 98.9 F (37.2 C) 98.9 F (37.2 C) 98.5 F (36.9 C) (!) 97.4 F (36.3 C)  TempSrc: Oral Oral Oral Oral  SpO2: 99% 97% 96% 95%  Weight:    252 lb 9.6 oz (114.6 kg)    Intake/Output Summary (Last 24 hours) at 03/16/2017 1400 Last data filed at 03/16/2017 1207 Gross per 24 hour  Intake 2442.36 ml  Output 1700 ml  Net 742.36 ml   Physical Exam: A&Ox3, NAD CV: RRR Pulmonary: CTA Bilaterally Abdomen: Soft, Nontender, Nondistended Vascular:  Left lower extremity: Necrotic tissue noted to the medial aspect of the great toe.  Noted to the left foot   Laboratory: CBC    Component Value Date/Time   WBC 9.2 03/16/2017 0333   HGB 10.0 (L) 03/16/2017 0333   HGB 7.5 (L) 08/18/2011 0558   HCT 31.0 (L) 03/16/2017 0333   HCT 23.0 (L) 08/18/2011 0558   PLT 393 03/16/2017 0333   PLT 399 08/18/2011 0558   BMET    Component Value Date/Time   NA 136 03/16/2017 0333   NA 141 08/13/2011 0626   K 3.8 03/16/2017 0333   K 3.9 08/13/2011 0626   CL 104 03/16/2017 0333   CL 108 (H) 08/13/2011 0626   CO2 25 03/16/2017 0333   CO2 23 08/13/2011 0626   GLUCOSE 240 (H) 03/16/2017 0333   GLUCOSE 135 (H) 08/13/2011 0626   BUN 25 (H) 03/16/2017 0333   BUN 10 08/13/2011 0626   CREATININE 1.18 03/16/2017 0333   CREATININE 1.10 08/13/2011 0626   CALCIUM 8.1 (L) 03/16/2017 0333   CALCIUM 8.0 (L) 08/13/2011 0626   GFRNONAA >60 03/16/2017 0333   GFRNONAA >60 08/13/2011 0626   GFRAA >60 03/16/2017 0333   GFRAA >60 08/13/2011 0626   Assessment/Planning: The patient is a 67 year old male well-known to our practice who  was directly admitted to [facility] with worsening left great toe infection.  The patient was found to have osteomyelitis on xray to the area - Stable 1) Podiatry will take the patient to the operating room for a left first ray amputation on Saturday, March 17, 2017 2) Patient is scheduled to undergo a left lower extremity angiogram with possible intervention on Tuesday 03/20/17 to assess his anatomy and degree of contributing peripheral artery disease.  If appropriate at that time, an attempt to revascularize the leg can be made.  Procedure, risks and benefits explained to the patient.  All questions answered.  The patient wishes to proceed. 3) Appreciate diabetic coordinator recommendations 4) Appreciate podiatry recommendations 5) Appreciate internal medicine recommendations 6) Continue IV vancomycin and Zosyn for left great toe osteomyelitis  Marcelle Overlie PA-C 03/16/2017 2:00 PM

## 2017-03-17 ENCOUNTER — Inpatient Hospital Stay: Payer: Medicare HMO | Admitting: Anesthesiology

## 2017-03-17 ENCOUNTER — Encounter
Admission: AD | Disposition: A | Discharge status: Home / Self Care | Payer: Self-pay | Source: Ambulatory Visit | Attending: Vascular Surgery

## 2017-03-17 ENCOUNTER — Encounter: Payer: Self-pay | Admitting: Anesthesiology

## 2017-03-17 HISTORY — PX: AMPUTATION TOE: SHX6595

## 2017-03-17 LAB — CBC
HEMATOCRIT: 31.2 % — AB (ref 40.0–52.0)
HEMATOCRIT: 29.5 % — AB (ref 40.0–52.0)
HEMOGLOBIN: 10.4 g/dL — AB (ref 13.0–18.0)
Hemoglobin: 9.7 g/dL — ABNORMAL LOW (ref 13.0–18.0)
MCH: 26.6 pg (ref 26.0–34.0)
MCH: 26.4 pg (ref 26.0–34.0)
MCHC: 33.4 g/dL (ref 32.0–36.0)
MCHC: 32.7 g/dL (ref 32.0–36.0)
MCV: 79.6 fL — ABNORMAL LOW (ref 80.0–100.0)
MCV: 80.7 fL (ref 80.0–100.0)
Platelets: 387 10*3/uL (ref 150–440)
Platelets: 366 10*3/uL (ref 150–440)
RBC: 3.92 MIL/uL — AB (ref 4.40–5.90)
RBC: 3.66 MIL/uL — ABNORMAL LOW (ref 4.40–5.90)
RDW: 14 % (ref 11.5–14.5)
RDW: 14.7 % — AB (ref 11.5–14.5)
WBC: 8.2 10*3/uL (ref 3.8–10.6)
WBC: 7.5 10*3/uL (ref 3.8–10.6)

## 2017-03-17 LAB — MAGNESIUM: MAGNESIUM: 2 mg/dL (ref 1.7–2.4)

## 2017-03-17 LAB — GLUCOSE, CAPILLARY
GLUCOSE-CAPILLARY: 197 mg/dL — AB (ref 65–99)
Glucose-Capillary: 121 mg/dL — ABNORMAL HIGH (ref 65–99)
Glucose-Capillary: 155 mg/dL — ABNORMAL HIGH (ref 65–99)
Glucose-Capillary: 217 mg/dL — ABNORMAL HIGH (ref 65–99)

## 2017-03-17 LAB — BASIC METABOLIC PANEL
ANION GAP: 7 (ref 5–15)
BUN: 20 mg/dL (ref 6–20)
CHLORIDE: 107 mmol/L (ref 101–111)
CO2: 27 mmol/L (ref 22–32)
Calcium: 8.5 mg/dL — ABNORMAL LOW (ref 8.9–10.3)
Creatinine, Ser: 1.02 mg/dL (ref 0.61–1.24)
GFR calc non Af Amer: >60 mL/min (ref >60)
GLUCOSE: 95 mg/dL (ref 65–99)
POTASSIUM: 4 mmol/L (ref 3.5–5.1)
Sodium: 141 mmol/L (ref 135–145)

## 2017-03-17 LAB — PROTIME-INR
INR: 1.4
Prothrombin Time: 17 seconds — ABNORMAL HIGH (ref 11.4–15.2)

## 2017-03-17 LAB — SURGICAL PCR SCREEN
MRSA, PCR: NEGATIVE
STAPHYLOCOCCUS AUREUS: NEGATIVE

## 2017-03-17 LAB — VANCOMYCIN, TROUGH: VANCOMYCIN TR: 17 ug/mL (ref 15–20)

## 2017-03-17 SURGERY — AMPUTATION, TOE
Anesthesia: General | Site: Foot | Laterality: Left | Wound class: Dirty or Infected

## 2017-03-17 MED ORDER — ROCURONIUM BROMIDE 50 MG/5ML IV SOLN
INTRAVENOUS | Status: AC
  Filled 2017-03-17: qty 1

## 2017-03-17 MED ORDER — SUGAMMADEX SODIUM 500 MG/5ML IV SOLN
INTRAVENOUS | Status: DC | PRN
  Administered 2017-03-17: 225 mg via INTRAVENOUS

## 2017-03-17 MED ORDER — BUPIVACAINE HCL 0.5 % IJ SOLN
INTRAMUSCULAR | Status: DC | PRN
  Administered 2017-03-17: 10 mL

## 2017-03-17 MED ORDER — LIDOCAINE HCL (CARDIAC) 20 MG/ML IV SOLN
INTRAVENOUS | Status: DC | PRN
  Administered 2017-03-17: 100 mg via INTRAVENOUS

## 2017-03-17 MED ORDER — FENTANYL CITRATE (PF) 100 MCG/2ML IJ SOLN
INTRAMUSCULAR | Status: DC | PRN
  Administered 2017-03-17 (×2): 50 ug via INTRAVENOUS

## 2017-03-17 MED ORDER — HEPARIN (PORCINE) IN NACL 100-0.45 UNIT/ML-% IJ SOLN
1700.0000 [IU]/h | INTRAMUSCULAR | Status: DC
  Administered 2017-03-17: 1500 [IU]/h via INTRAVENOUS
  Administered 2017-03-18: 1700 [IU]/h via INTRAVENOUS
  Administered 2017-03-19: 1500 [IU]/h via INTRAVENOUS
  Filled 2017-03-17 (×4): qty 250

## 2017-03-17 MED ORDER — ONDANSETRON HCL 4 MG/2ML IJ SOLN
INTRAMUSCULAR | Status: DC | PRN
  Administered 2017-03-17: 4 mg via INTRAVENOUS

## 2017-03-17 MED ORDER — LIDOCAINE HCL (PF) 2 % IJ SOLN
INTRAMUSCULAR | Status: AC
  Filled 2017-03-17: qty 10

## 2017-03-17 MED ORDER — MORPHINE SULFATE (PF) 2 MG/ML IV SOLN: 2.0000 mg | INTRAVENOUS | Status: DC | PRN

## 2017-03-17 MED ORDER — FENTANYL CITRATE (PF) 100 MCG/2ML IJ SOLN: 25.0000 ug | INTRAMUSCULAR | Status: DC | PRN

## 2017-03-17 MED ORDER — SUGAMMADEX SODIUM 500 MG/5ML IV SOLN
INTRAVENOUS | Status: AC
  Filled 2017-03-17: qty 5

## 2017-03-17 MED ORDER — MIDAZOLAM HCL 2 MG/2ML IJ SOLN
INTRAMUSCULAR | Status: DC | PRN
  Administered 2017-03-17: 2 mg via INTRAVENOUS

## 2017-03-17 MED ORDER — PROPOFOL 500 MG/50ML IV EMUL
INTRAVENOUS | Status: AC
  Filled 2017-03-17: qty 50

## 2017-03-17 MED ORDER — SUCCINYLCHOLINE CHLORIDE 20 MG/ML IJ SOLN
INTRAMUSCULAR | Status: AC
  Filled 2017-03-17: qty 1

## 2017-03-17 MED ORDER — PHENYLEPHRINE HCL 10 MG/ML IJ SOLN
INTRAMUSCULAR | Status: DC | PRN
  Administered 2017-03-17 (×3): 100 ug via INTRAVENOUS

## 2017-03-17 MED ORDER — VANCOMYCIN HCL 1000 MG IV SOLR
INTRAVENOUS | Status: DC | PRN
  Administered 2017-03-17: 1000 mg

## 2017-03-17 MED ORDER — FENTANYL CITRATE (PF) 100 MCG/2ML IJ SOLN
INTRAMUSCULAR | Status: AC
  Filled 2017-03-17: qty 2

## 2017-03-17 MED ORDER — PROPOFOL 10 MG/ML IV BOLUS
INTRAVENOUS | Status: DC | PRN
  Administered 2017-03-17: 150 mg via INTRAVENOUS

## 2017-03-17 MED ORDER — EPHEDRINE SULFATE 50 MG/ML IJ SOLN
INTRAMUSCULAR | Status: DC | PRN
  Administered 2017-03-17: 10 mg via INTRAVENOUS

## 2017-03-17 MED ORDER — ROCURONIUM BROMIDE 100 MG/10ML IV SOLN
INTRAVENOUS | Status: DC | PRN
  Administered 2017-03-17: 20 mg via INTRAVENOUS
  Administered 2017-03-17: 10 mg via INTRAVENOUS

## 2017-03-17 MED ORDER — SUCCINYLCHOLINE CHLORIDE 20 MG/ML IJ SOLN
INTRAMUSCULAR | Status: DC | PRN
  Administered 2017-03-17: 120 mg via INTRAVENOUS

## 2017-03-17 MED ORDER — VANCOMYCIN HCL 1000 MG IV SOLR
INTRAVENOUS | Status: AC
  Filled 2017-03-17: qty 1000

## 2017-03-17 MED ORDER — INSULIN ASPART 100 UNIT/ML ~~LOC~~ SOLN
0.0000 [IU] | Freq: Three times a day (TID) | SUBCUTANEOUS | Status: DC
  Administered 2017-03-17: 3 [IU] via SUBCUTANEOUS
  Administered 2017-03-18: 2 [IU] via SUBCUTANEOUS
  Administered 2017-03-18 – 2017-03-20 (×4): 1 [IU] via SUBCUTANEOUS
  Administered 2017-03-20: 2 [IU] via SUBCUTANEOUS
  Filled 2017-03-17 (×7): qty 1

## 2017-03-17 MED ORDER — HEPARIN BOLUS VIA INFUSION
4700.0000 [IU] | Freq: Once | INTRAVENOUS | Status: DC
  Filled 2017-03-17: qty 4700

## 2017-03-17 MED ORDER — ONDANSETRON HCL 4 MG/2ML IJ SOLN
INTRAMUSCULAR | Status: AC
  Filled 2017-03-17: qty 2

## 2017-03-17 MED ORDER — INSULIN ASPART 100 UNIT/ML ~~LOC~~ SOLN
6.0000 [IU] | Freq: Three times a day (TID) | SUBCUTANEOUS | Status: DC
  Administered 2017-03-17 – 2017-03-19 (×7): 6 [IU] via SUBCUTANEOUS
  Filled 2017-03-17 (×7): qty 1

## 2017-03-17 MED ORDER — HEMOSTATIC AGENTS (NO CHARGE) OPTIME
TOPICAL | Status: DC | PRN
  Administered 2017-03-17: 1 via TOPICAL

## 2017-03-17 MED ORDER — MIDAZOLAM HCL 2 MG/2ML IJ SOLN
INTRAMUSCULAR | Status: AC
  Filled 2017-03-17: qty 2

## 2017-03-17 MED ORDER — ONDANSETRON HCL 4 MG/2ML IJ SOLN: 4.0000 mg | Freq: Once | INTRAMUSCULAR | Status: DC | PRN

## 2017-03-17 SURGICAL SUPPLY — 51 items
BANDAGE ACE 4X5 VEL STRL LF (GAUZE/BANDAGES/DRESSINGS) ×2 IMPLANT
BLADE MED AGGRESSIVE (BLADE) ×2 IMPLANT
BLADE OSC/SAGITTAL MD 5.5X18 (BLADE) ×2 IMPLANT
BLADE SURG 15 STRL LF DISP TIS (BLADE) ×2 IMPLANT
BLADE SURG 15 STRL SS (BLADE) ×4
BLADE SURG MINI STRL (BLADE) ×2 IMPLANT
BNDG CMPR 75X21 PLY HI ABS (MISCELLANEOUS) ×1
BNDG ESMARK 4X12 TAN STRL LF (GAUZE/BANDAGES/DRESSINGS) ×2 IMPLANT
BNDG GAUZE 4.5X4.1 6PLY STRL (MISCELLANEOUS) ×3 IMPLANT
CANISTER SUCT 1200ML W/VALVE (MISCELLANEOUS) ×2 IMPLANT
CUFF TOURN 18 STER (MISCELLANEOUS) ×2 IMPLANT
CUFF TOURN DUAL PL 12 NO SLV (MISCELLANEOUS) ×2 IMPLANT
DRAPE FLUOR MINI C-ARM 54X84 (DRAPES) ×1 IMPLANT
DURAPREP 26ML APPLICATOR (WOUND CARE) ×2 IMPLANT
ELECT REM PT RETURN 9FT ADLT (ELECTROSURGICAL) ×2
ELECTRODE REM PT RTRN 9FT ADLT (ELECTROSURGICAL) ×1 IMPLANT
GAUZE SPONGE 4X4 12PLY STRL (GAUZE/BANDAGES/DRESSINGS) ×3 IMPLANT
GAUZE STRETCH 2X75IN STRL (MISCELLANEOUS) ×2 IMPLANT
GAUZE XEROFORM 4X4 STRL (GAUZE/BANDAGES/DRESSINGS) ×1 IMPLANT
GLOVE BIO SURGEON STRL SZ7.5 (GLOVE) ×2 IMPLANT
GLOVE INDICATOR 8.0 STRL GRN (GLOVE) ×2 IMPLANT
GOWN STRL REUS W/ TWL LRG LVL3 (GOWN DISPOSABLE) ×2 IMPLANT
GOWN STRL REUS W/TWL LRG LVL3 (GOWN DISPOSABLE) ×4
HANDPIECE VERSAJET DEBRIDEMENT (MISCELLANEOUS) ×2 IMPLANT
HEMOSTAT SURGICEL 2X14 (HEMOSTASIS) ×1 IMPLANT
KIT STIMULAN RAPID CURE 5CC (Orthopedic Implant) ×1 IMPLANT
KIT TURNOVER KIT A (KITS) ×2 IMPLANT
LABEL OR SOLS (LABEL) ×2 IMPLANT
NDL FILTER BLUNT 18X1 1/2 (NEEDLE) ×1 IMPLANT
NDL HYPO 25X1 1.5 SAFETY (NEEDLE) ×2 IMPLANT
NEEDLE FILTER BLUNT 18X 1/2SAF (NEEDLE) ×1
NEEDLE FILTER BLUNT 18X1 1/2 (NEEDLE) ×1 IMPLANT
NEEDLE HYPO 25X1 1.5 SAFETY (NEEDLE) ×4 IMPLANT
NS IRRIG 500ML POUR BTL (IV SOLUTION) ×2 IMPLANT
PACK EXTREMITY ARMC (MISCELLANEOUS) ×2 IMPLANT
PAD ABD DERMACEA PRESS 5X9 (GAUZE/BANDAGES/DRESSINGS) ×2 IMPLANT
SOL .9 NS 3000ML IRR  AL (IV SOLUTION) ×1
SOL .9 NS 3000ML IRR AL (IV SOLUTION) ×1
SOL .9 NS 3000ML IRR UROMATIC (IV SOLUTION) ×1 IMPLANT
SOL PREP PVP 2OZ (MISCELLANEOUS) ×2
SOLUTION PREP PVP 2OZ (MISCELLANEOUS) ×1 IMPLANT
SPONGE LAP 18X18 5 PK (GAUZE/BANDAGES/DRESSINGS) ×2 IMPLANT
STOCKINETTE STRL 6IN 960660 (GAUZE/BANDAGES/DRESSINGS) ×2 IMPLANT
STRIP CLOSURE SKIN 1/4X4 (GAUZE/BANDAGES/DRESSINGS) ×2 IMPLANT
SUT ETHILON 3-0 FS-10 30 BLK (SUTURE) ×2
SUT ETHILON 4-0 (SUTURE)
SUT ETHILON 4-0 FS2 18XMFL BLK (SUTURE)
SUTURE EHLN 3-0 FS-10 30 BLK (SUTURE) ×1 IMPLANT
SUTURE ETHLN 4-0 FS2 18XMF BLK (SUTURE) IMPLANT
SWAB DUAL CULTURE TRANS RED ST (MISCELLANEOUS) ×2 IMPLANT
SYR 10ML LL (SYRINGE) ×2 IMPLANT

## 2017-03-17 NOTE — Progress Notes (Signed)
Patient tolerated po water well.

## 2017-03-17 NOTE — Anesthesia Procedure Notes (Signed)
Procedure Name: Intubation Date/Time: 03/17/2017 12:09 PM Performed by: Aline Brochure, CRNA Pre-anesthesia Checklist: Patient identified, Emergency Drugs available, Suction available and Patient being monitored Patient Re-evaluated:Patient Re-evaluated prior to induction Oxygen Delivery Method: Circle system utilized Preoxygenation: Pre-oxygenation with 100% oxygen Induction Type: IV induction Ventilation: Mask ventilation without difficulty Laryngoscope Size: Mac and 4 Grade View: Grade I Tube type: Oral Tube size: 7.5 mm Number of attempts: 1 Airway Equipment and Method: Stylet Placement Confirmation: ETT inserted through vocal cords under direct vision,  positive ETCO2 and breath sounds checked- equal and bilateral Secured at: 22 cm Tube secured with: Tape Dental Injury: Teeth and Oropharynx as per pre-operative assessment

## 2017-03-17 NOTE — Progress Notes (Signed)
Pt arrived back to room 146. Surgical dressing clean, dry and intact. No complaints of pain. Left foot elevated on two pillows. Bed in lowest position call bell in reach and bed alarm on.

## 2017-03-17 NOTE — Anesthesia Post-op Follow-up Note (Signed)
Anesthesia QCDR form completed.        

## 2017-03-17 NOTE — Progress Notes (Signed)
Pharmacy Antibiotic Note  Benjamin Davidson is a 67 y.o. male admitted on 03/15/2017 with left foot ulcer and need for toe amputation as well as LE revascularization. Patient has been on abx including CTX injection x 1 and Augmentin.  Pharmacy has been consulted for vancomycin dosing. Patient is also ordered Zosyn.   Patient received one dose of vancomycin 1250 mg under a surgical prophylaxis consult  Plan: Ke= 0.068 h-1 Vd= 60.9 L  Vancomycin 1250 mg iv q 12 hours. Will plan on checking a trough with the 5th dose for a goal of 15-20 mcg/ml. Goal trough= 15-20 mcg/ml.   02/16 @ 2230 VT 17 therapeutic, drawn appropriately. Will continue current regimen and will recheck VT 02/17 @ 2200. Renal function stable, 1.6L out over past 24 hours.  Height: 5\' 8"  (172.7 cm) Weight: 249 lb 11.2 oz (113.3 kg) IBW/kg (Calculated) : 68.4  Temp (24hrs), Avg:98 F (36.7 C), Min:97.6 F (36.4 C), Max:98.4 F (36.9 C)  Recent Labs  Lab 03/15/17 1125 03/16/17 0333 03/17/17 0346 03/17/17 1642 03/17/17 2221  WBC 10.8* 9.2 8.2 7.5  --   CREATININE 1.13 1.18 1.02  --   --   VANCOTROUGH  --   --   --   --  17    Estimated Creatinine Clearance: 88.2 mL/min (by C-G formula based on SCr of 1.02 mg/dL).    Allergies  Allergen Reactions  . Lisinopril     Cough Flu like symptoms     Antimicrobials this admission: Zosyn 2/14 >>  vancomycin 2/15 >>   Dose adjustments this admission:   Microbiology results:  Thank you for allowing pharmacy to be a part of this patient's care.  Tobie Lords, PharmD, BCPS Clinical Pharmacist 03/17/2017

## 2017-03-17 NOTE — Progress Notes (Signed)
Patient currently in Benjamin Davidson.  Will follow up post-op  [name]

## 2017-03-17 NOTE — Interval H&P Note (Signed)
History and Physical Interval Note:  03/17/2017 11:12 AM  Benjamin Davidson  has presented today for surgery, with the diagnosis of n/a  The various methods of treatment have been discussed with the patient and family. After consideration of risks, benefits and other options for treatment, the patient has consented to  Procedure(s): [place] (Left) as a surgical intervention .  The patient's history has been reviewed, patient examined, no change in status, stable for surgery.  I have reviewed the patient's chart and labs.  Questions were answered to the patient's satisfaction.     Durward Fortes

## 2017-03-17 NOTE — Op Note (Signed)
Date of operation: 03/17/2017.  Surgeon: Durward Fortes D.P.M.  Preoperative diagnosis: Osteomyelitis left first metatarsal and toe.  Postoperative diagnosis: Same.  Procedure: Amputation left great toe with partial first ray resection.  Anesthesia: General.  Hemostasis: Pneumatic tourniquet left ankle 300 mmHg.  Estimated blood loss: 75-100 cc.  Pathology: Left great toe and first metatarsal.  Implants: Stimulan rapid cure antibiotic beads impregnated with vancomycin.  Injectables: 10 cc 0.5% bupivacaine plain postoperatively.  Drains: None.  Complications: None apparent.  Operative indications: This is a 67 year old male with history of ulceration on his left great toe joint that progressed down to the level of osteomyelitis with pathological fracture.  Decision made for amputation of the great toe with partial ray resection to try to limit infection prior to his vascular intervention next week with possible revision to transmetatarsal amputation if needed.  Operative procedure: Patient was taken to the operating room and placed on the table in the supine position.  Following satisfactory general anesthesia a pneumatic tourniquet was applied at the level of the left ankle and the foot was prepped and draped in the usual sterile fashion.  The foot was exsanguinated and the tourniquet inflated to 250 mmHg.  Attention was then directed to the medial aspect of the left foot where an elliptical incision was made from along the medial aspect of the first metatarsal encompassing the ulceration over the first metatarsal around the dorsal and plantar base of the great toe ending in the first interspace.  The incision was deepened via sharp and blunt dissection.  Significant bleeding was encountered and the tourniquet was let down and the foot was re-exsanguinated and the tourniquet was reinflated at 300 mmHg.       Dissection was then carried sharply along the bone back to the level of the first  metatarsal past the metatarsal phalangeal joint where the first metatarsal was incised using a sagittal saw and the great toe and first metatarsal were removed in toto.  Significant bleeding was still noted through the tourniquet.  The wound was flushed with copious amounts of sterile saline.  Bleeders were identified and cauterized as possible.  The wound was again flushed with copious amounts of sterile saline and then Surgicel was applied to the base of the wound followed by Stimulan rapid cure antibiotic beads and the incision was then closed using 3-0 nylon simple interrupted sutures.  Xeroform 4 x 4's ABDs and Kerlix were applied and the tourniquet was released.  A second Kerlix and Ace wrap were then applied for compression.  Patient tolerated the procedure and anesthesia well and was transported to the PACU with vital signs stable and in good condition.

## 2017-03-17 NOTE — Progress Notes (Signed)
[facility] [place] NAME: Benjamin Davidson    MR#:  [id number]  DATE OF BIRTH:  [birth year]  SUBJECTIVE:   Patient plan to go to operating room today No acute issues overnight  REVIEW OF SYSTEMS:    Review of Systems  Constitutional: Negative for fever, chills weight loss HENT: Negative for ear pain, nosebleeds, congestion, facial swelling, rhinorrhea, neck pain, neck stiffness and ear discharge.   Respiratory: Negative for cough, shortness of breath, wheezing  Cardiovascular: Negative for chest pain, palpitations and leg swelling.  Gastrointestinal: Negative for heartburn, abdominal pain, vomiting, diarrhea or consitpation Genitourinary: Negative for dysuria, urgency, frequency, hematuria Musculoskeletal: Negative for back pain or joint pain Neurological: Negative for dizziness, seizures, syncope, focal weakness,  numbness and headaches.  Hematological: Does not bruise/bleed easily.  Psychiatric/Behavioral: Negative for hallucinations, confusion, dysphoric mood    Tolerating Diet: npo      DRUG ALLERGIES:   Allergies  Allergen Reactions  . Lisinopril     Cough Flu like symptoms     VITALS:  Blood pressure (!) 117/41, pulse 64, temperature 98.4 F (36.9 C), temperature source Oral, resp. rate 16, height 5\' 8"  (1.727 m), weight 113.3 kg (249 lb 11.2 oz), SpO2 100 %.  PHYSICAL EXAMINATION:  Constitutional: Appears well-developed and well-nourished. No distress. HENT: Normocephalic. Marland Kitchen Oropharynx is clear and moist.  Eyes: Conjunctivae and EOM are normal. PERRLA, no scleral icterus.  Neck: Normal ROM. Neck supple. No JVD. No tracheal deviation. CVS: RRR, S1/S2 +, no murmurs, no gallops, no carotid bruit.  Pulmonary: Effort and breath sounds normal, no stridor, rhonchi, wheezes, rales.  Abdominal: Soft. BS +,  no distension, tenderness, rebound or guarding.  Musculoskeletal: Normal range of motion. No edema and no tenderness.   Neuro: Alert. CN 2-12 grossly intact. No focal deficits. Skin: Foot is wrapped  Psychiatric: Normal mood and affect.      LABORATORY PANEL:   CBC Recent Labs  Lab 03/17/17 0346  WBC 8.2  HGB 10.4*  HCT 31.2*  PLT 387   ------------------------------------------------------------------------------------------------------------------  Chemistries  Recent Labs  Lab 03/17/17 0346  NA 141  K 4.0  CL 107  CO2 27  GLUCOSE 95  BUN 20  CREATININE 1.02  CALCIUM 8.5*  MG 2.0   ------------------------------------------------------------------------------------------------------------------  Cardiac Enzymes No results for input(s): TROPONINI in the last 168 hours. ------------------------------------------------------------------------------------------------------------------  RADIOLOGY:  Dg Foot 2 Views Left  Result Date: 03/15/2017 CLINICAL DATA:  Evaluate for osteomyelitis. EXAM: LEFT FOOT - 2 VIEW COMPARISON:  Left foot x-rays dated March 08, 2017. FINDINGS: Ulceration along the medial aspect of the first MTP joint again noted. New fracture of the first metatarsal head with 7 mm medial displacement. There is new osteolysis and irregularity of the base of the first proximal phalanx. 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 Unchanged healed fourth proximal metatarsal shaft fracture. Unchanged fourth PIP joint destruction. No subcutaneous emphysema. IMPRESSION: 1. Osteomyelitis about the first MTP joint with new displaced pathologic fracture of the first metatarsal head. Electronically Signed   By: Titus Dubin M.D.   On: 03/15/2017 11:36     ASSESSMENT AND PLAN:   67 year old male with history of diabetes and peripheral vascular disease who was admitted for ulceration with osteomyelitis left great toe.  1. Diabetes: Continue Levemir, sliding scale and NovoLog Continue ADA diet A1c 7.4   2. Essential hypertension: Continue Coreg and  Cozaar  3. History of left leg DVT: Currently on heparin drip  Restart Coumadin once okay with podiatry/vascular    4. CAD: Continue aspirin, Coreg, Cozaar and statin  5. Left great toe osteomyelitis with ulceration: Plan to go to the OR today Management as per podiatry Continue Zosyn and vancomycin  6. Depression: Continue Zoloft   Management plans discussed with the patient and he is in agreement.  CODE STATUS: Full  TOTAL TIME TAKING CARE OF THIS PATIENT: 24 minutes.     POSSIBLE D/C 1-2 days, DEPENDING ON CLINICAL CONDITION.   Rozanne Heumann M.D on 03/17/2017 at 8:21 AM  Between 7am to 6pm - Pager - [phone] After 6pm go to [email or web address] - password [email or web address] [facility]  Office  [phone]  CC: Primary care physician; Venia Carbon, MD  Note: This dictation was prepared with Dragon dictation along with smaller phrase technology. Any transcriptional errors that result from this process are unintentional.

## 2017-03-17 NOTE — Progress Notes (Signed)
15 minute call to floor. 

## 2017-03-17 NOTE — Transfer of Care (Signed)
Immediate Anesthesia Transfer of Care Note  Patient: Benjamin Davidson  Procedure(s) Performed: FIRST RAY RESECTION LEFT FOOT (Left Foot)  Patient Location: PACU  Anesthesia Type:General  Level of Consciousness: sedated  Airway & Oxygen Therapy: Patient connected to face mask oxygen  Post-op Assessment: Post -op Vital signs reviewed and stable  Post vital signs: stable  Last Vitals:  Vitals:   03/17/17 0748 03/17/17 1308  BP: (!) 117/41 (!) 140/51  Pulse: 64 85  Resp: 16 12  Temp: 36.9 C   SpO2: 100% 99%    Last Pain:  Vitals:   03/17/17 0748  TempSrc: Oral  PainSc: 0-No pain         Complications: No apparent anesthesia complications

## 2017-03-17 NOTE — Anesthesia Postprocedure Evaluation (Signed)
Anesthesia Post Note  Patient: Benjamin Davidson  Procedure(s) Performed: FIRST RAY RESECTION LEFT FOOT (Left Foot)  Patient location during evaluation: PACU Anesthesia Type: General Level of consciousness: awake and alert and oriented Pain management: pain level controlled Vital Signs Assessment: post-procedure vital signs reviewed and stable Respiratory status: spontaneous breathing Cardiovascular status: blood pressure returned to baseline Anesthetic complications: no     Last Vitals:  Vitals:   03/17/17 1555 03/17/17 2009  BP: (!) 126/55 (!) 133/58  Pulse: 66 72  Resp: 18 20  Temp: 36.8 C 36.9 C  SpO2: 98% 97%    Last Pain:  Vitals:   03/17/17 2034  TempSrc:   PainSc: 3                  [name]

## 2017-03-18 LAB — MAGNESIUM: MAGNESIUM: 1.9 mg/dL (ref 1.7–2.4)

## 2017-03-18 LAB — PROTIME-INR
INR: 1.37
PROTHROMBIN TIME: 16.8 s — AB (ref 11.4–15.2)

## 2017-03-18 LAB — HEPARIN LEVEL (UNFRACTIONATED)
HEPARIN UNFRACTIONATED: 0.25 [IU]/mL — AB (ref 0.30–0.70)
HEPARIN UNFRACTIONATED: 0.65 [IU]/mL (ref 0.30–0.70)
Heparin Unfractionated: 0.51 IU/mL (ref 0.30–0.70)
Heparin Unfractionated: 0.59 IU/mL (ref 0.30–0.70)

## 2017-03-18 LAB — BASIC METABOLIC PANEL
Anion gap: 6 (ref 5–15)
BUN: 19 mg/dL (ref 6–20)
CHLORIDE: 104 mmol/L (ref 101–111)
CO2: 27 mmol/L (ref 22–32)
CREATININE: 1.01 mg/dL (ref 0.61–1.24)
Calcium: 7.9 mg/dL — ABNORMAL LOW (ref 8.9–10.3)
Glucose, Bld: 189 mg/dL — ABNORMAL HIGH (ref 65–99)
POTASSIUM: 4.2 mmol/L (ref 3.5–5.1)
SODIUM: 137 mmol/L (ref 135–145)

## 2017-03-18 LAB — GLUCOSE, CAPILLARY
GLUCOSE-CAPILLARY: 125 mg/dL — AB (ref 65–99)
GLUCOSE-CAPILLARY: 194 mg/dL — AB (ref 65–99)
Glucose-Capillary: 175 mg/dL — ABNORMAL HIGH (ref 65–99)
Glucose-Capillary: 123 mg/dL — ABNORMAL HIGH (ref 65–99)

## 2017-03-18 LAB — CBC
HCT: 30 % — ABNORMAL LOW (ref 40.0–52.0)
Hemoglobin: 9.8 g/dL — ABNORMAL LOW (ref 13.0–18.0)
MCH: 26.3 pg (ref 26.0–34.0)
MCHC: 32.8 g/dL (ref 32.0–36.0)
MCV: 80.1 fL (ref 80.0–100.0)
PLATELETS: 367 10*3/uL (ref 150–440)
RBC: 3.75 MIL/uL — AB (ref 4.40–5.90)
RDW: 14.5 % (ref 11.5–14.5)
WBC: 9.8 10*3/uL (ref 3.8–10.6)

## 2017-03-18 MED ORDER — PHENOL 1.4 % MT LIQD
1.0000 | OROMUCOSAL | Status: DC | PRN
  Filled 2017-03-18: qty 177

## 2017-03-18 NOTE — Progress Notes (Signed)
    Subjective  - POD #1, s/p partial ray amputation of left great toe by Dr. Cleda Mccreedy  Operative report indicates good bleeding at amputation site Patient resting comfortable in bed without pain   Physical Exam:  Dressing intact and dry       Assessment/Plan:  POD #1  Discussed with patient plans for angiography by Dr.Schnier on Tuesday to evaluate and optimize blood flow to left foot.  Benjamin Davidson 03/18/2017 3:37 PM --  Vitals:   03/18/17 0443 03/18/17 0843  BP: (!) 134/58 (!) 159/81  Pulse: 70 74  Resp: 19 18  Temp: 98.3 F (36.8 C) 98.3 F (36.8 C)  SpO2: 95% 94%    Intake/Output Summary (Last 24 hours) at 03/18/2017 1537 Last data filed at 03/18/2017 1300 Gross per 24 hour  Intake 3419.38 ml  Output 3550 ml  Net -130.62 ml     Laboratory CBC    Component Value Date/Time   WBC 9.8 03/18/2017 0157   HGB 9.8 (L) 03/18/2017 0157   HGB 7.5 (L) 08/18/2011 0558   HCT 30.0 (L) 03/18/2017 0157   HCT 23.0 (L) 08/18/2011 0558   PLT 367 03/18/2017 0157   PLT 399 08/18/2011 0558    BMET    Component Value Date/Time   NA 137 03/18/2017 0157   NA 141 08/13/2011 0626   K 4.2 03/18/2017 0157   K 3.9 08/13/2011 0626   CL 104 03/18/2017 0157   CL 108 (H) 08/13/2011 0626   CO2 27 03/18/2017 0157   CO2 23 08/13/2011 0626   GLUCOSE 189 (H) 03/18/2017 0157   GLUCOSE 135 (H) 08/13/2011 0626   BUN 19 03/18/2017 0157   BUN 10 08/13/2011 0626   CREATININE 1.01 03/18/2017 0157   CREATININE 1.10 08/13/2011 0626   CALCIUM 7.9 (L) 03/18/2017 0157   CALCIUM 8.0 (L) 08/13/2011 0626   GFRNONAA >60 03/18/2017 0157   GFRNONAA >60 08/13/2011 0626   GFRAA >60 03/18/2017 0157   GFRAA >60 08/13/2011 0626    COAG Lab Results  Component Value Date   INR 1.37 03/18/2017   INR 1.40 03/17/2017   INR 1.77 03/16/2017   No results found for: PTT  Antibiotics Anti-infectives (From admission, onward)   Start     Dose/Rate Route Frequency Ordered Stop   03/17/17 1228   vancomycin (VANCOCIN) powder  Status:  Discontinued       As needed 03/17/17 1228 03/17/17 1259   03/16/17 1030  vancomycin (VANCOCIN) 1,250 mg in sodium chloride 0.9 % 250 mL IVPB     1,250 mg 166.7 mL/hr over 90 Minutes Intravenous Every 12 hours 03/16/17 0942     03/16/17 0000  vancomycin (VANCOCIN) 1,250 mg in sodium chloride 0.9 % 250 mL IVPB     1,250 mg 166.7 mL/hr over 90 Minutes Intravenous  Once 03/15/17 2350 03/16/17 0223   03/15/17 1245  piperacillin-tazobactam (ZOSYN) IVPB 3.375 g     3.375 g 12.5 mL/hr over 240 Minutes Intravenous Every 8 hours 03/15/17 1235         V. Leia Alf, M.D. [facility] Office: [phone] Pager:  [phone]

## 2017-03-18 NOTE — Consult Note (Signed)
[place] for Heparin dosing and monitoring  Indication: PMH of DVT   Patient Measurements: Heparin Dosing Weight: 94 kg  Vital Signs: Temp: 98.3 F (36.8 C) (02/17 0843) Temp Source: Oral (02/17 0843) BP: 159/81 (02/17 0843) Pulse Rate: 74 (02/17 0843)  Labs: Recent Labs    03/15/17 1125  03/16/17 0333 03/16/17 0907 03/17/17 0346 03/17/17 1642 03/18/17 0157 03/18/17 0906  HGB 10.3*  --  10.0*  --  10.4* 9.7* 9.8*  --   HCT 31.1*  --  31.0*  --  31.2* 29.5* 30.0*  --   PLT 448*  --  393  --  387 366 367  --   APTT 39*  --   --   --   --   --   --   --   LABPROT 19.8*  --  20.5*  --  17.0*  --  16.8*  --   INR 1.70  --  1.77  --  1.40  --  1.37  --   HEPARINUNFRC  --    < > 0.39 0.61  --   --  0.25* 0.51  CREATININE 1.13  --  1.18  --  1.02  --  1.01  --    < > = values in this interval not displayed.    Estimated Creatinine Clearance: 89.1 mL/min (by C-G formula based on SCr of 1.01 mg/dL).    Assessment: Pharmacy consulted for heparin dosing and monitoring in 67 yo male with PMH of DVT. Patient was taking warfarin 5mg  QD PTA. INR was subtherapeutic at 1.7 on admission.   Warfarin being held inpatient - underwent toe amputation on 2/16. Plan for possible angiogram on 2/19 per notes.  Goal of Therapy:  Heparin level 0.3-0.7 units/ml Monitor platelets by anticoagulation protocol: Yes   Plan:  Heparin resumed post-op on 2/16.  HL = 0.51 is therapeutic. Will continue heparin infusion at current rate of 1700 units/hr and order confirmatory HL in 6 hours. CBC stable from yesterday.  Lenis Noon, PharmD, BCPS Clinical Pharmacist 03/18/2017 9:52 AM

## 2017-03-18 NOTE — Consult Note (Signed)
[place] for Heparin dosing and monitoring  Indication: PMH of DVT    Patient Measurements: Heparin Dosing Weight: 94 kg  Vital Signs: Temp: 98.3 F (36.8 C) (02/17 0002) Temp Source: Oral (02/17 0002) BP: 156/65 (02/17 0002) Pulse Rate: 72 (02/17 0002)  Labs: Recent Labs    03/15/17 1125  03/16/17 0333 03/16/17 0907 03/17/17 0346 03/17/17 1642 03/18/17 0157  HGB 10.3*  --  10.0*  --  10.4* 9.7* 9.8*  HCT 31.1*  --  31.0*  --  31.2* 29.5* 30.0*  PLT 448*  --  393  --  387 366 367  APTT 39*  --   --   --   --   --   --   LABPROT 19.8*  --  20.5*  --  17.0*  --  16.8*  INR 1.70  --  1.77  --  1.40  --  1.37  HEPARINUNFRC  --    < > 0.39 0.61  --   --  0.25*  CREATININE 1.13  --  1.18  --  1.02  --  1.01   < > = values in this interval not displayed.    Estimated Creatinine Clearance: 89.1 mL/min (by C-G formula based on SCr of 1.01 mg/dL).    Assessment: Pharmacy consulted for heparin dosing and monitoring in 67 yo male with PMH of DVT. Patient was taking warfarin 5mg  QD PTA. INR was subtherapeutic at 1.7 on admission.   Goal of Therapy:  Heparin level 0.3-0.7 units/ml Monitor platelets by anticoagulation protocol: Yes   Plan:  Heparin level remains within goal range with drip infusing at 1650 units/rh. Will recheck HL/CBC in AM.   02/17 @ 0200 HL 0.25 subtherapeutic. Will increase rate to 1700 units/hr and will recheck HL @ 0900. Hgb/plts trending down.  Tobie Lords, PharmD, BCPS Clinical Pharmacist 03/18/2017 3:08 AM

## 2017-03-18 NOTE — Progress Notes (Signed)
[facility] at [place] NAME: Benjamin Davidson    MR#:  [id number]  DATE OF BIRTH:  April 26, 1950  SUBJECTIVE:   Patient underwent amputation left great toe with partial first ray resection  No acute events overnight  Denies pain REVIEW OF SYSTEMS:    Review of Systems  Constitutional: Negative for fever, chills weight loss HENT: Negative for ear pain, nosebleeds, congestion, facial swelling, rhinorrhea, neck pain, neck stiffness and ear discharge.   Respiratory: Negative for cough, shortness of breath, wheezing  Cardiovascular: Negative for chest pain, palpitations and leg swelling.  Gastrointestinal: Negative for heartburn, abdominal pain, vomiting, diarrhea or consitpation Genitourinary: Negative for dysuria, urgency, frequency, hematuria Musculoskeletal: Negative for back pain or joint pain Neurological: Negative for dizziness, seizures, syncope, focal weakness,  numbness and headaches.  Hematological: Does not bruise/bleed easily.  Psychiatric/Behavioral: Negative for hallucinations, confusion, dysphoric mood    Tolerating Diet: Yes     DRUG ALLERGIES:   Allergies  Allergen Reactions  . Lisinopril     Cough Flu like symptoms     VITALS:  Blood pressure (!) 134/58, pulse 70, temperature 98.3 F (36.8 C), temperature source Oral, resp. rate 19, height 5\' 8"  (1.727 m), weight 113.3 kg (249 lb 11.2 oz), SpO2 95 %.  PHYSICAL EXAMINATION:  Constitutional: Appears well-developed and well-nourished. No distress. HENT: Normocephalic. Marland Kitchen Oropharynx is clear and moist.  Eyes: Conjunctivae and EOM are normal. PERRLA, no scleral icterus.  Neck: Normal ROM. Neck supple. No JVD. No tracheal deviation. CVS: RRR, S1/S2 +, no murmurs, no gallops, no carotid bruit.  Pulmonary: Effort and breath sounds normal, no stridor, rhonchi, wheezes, rales.  Abdominal: Soft. BS +,  no distension, tenderness, rebound or guarding.  Musculoskeletal: Normal  range of motion. No edema and no tenderness.  Neuro: Alert. CN 2-12 grossly intact. No focal deficits. Skin: Foot is wrapped in Kerlix and Ace wrap  Psychiatric: Normal mood and affect.      LABORATORY PANEL:   CBC Recent Labs  Lab 03/18/17 0157  WBC 9.8  HGB 9.8*  HCT 30.0*  PLT 367   ------------------------------------------------------------------------------------------------------------------  Chemistries  Recent Labs  Lab 03/18/17 0157  NA 137  K 4.2  CL 104  CO2 27  GLUCOSE 189*  BUN 19  CREATININE 1.01  CALCIUM 7.9*  MG 1.9   ------------------------------------------------------------------------------------------------------------------  Cardiac Enzymes No results for input(s): TROPONINI in the last 168 hours. ------------------------------------------------------------------------------------------------------------------  RADIOLOGY:  No results found.   ASSESSMENT AND PLAN:   67 year old male with history of diabetes and peripheral vascular disease who was admitted for ulceration with osteomyelitis left great toe.  1. Diabetes: Continue Levemir, sliding scale and NovoLog Continue ADA diet A1c 7.4   2. Essential hypertension: Continue Coreg and Cozaar  3. History of left leg DVT: Currently on heparin drip Restart Coumadin once okay with podiatry/vascular    4. CAD: Continue aspirin, Coreg, Cozaar and statin  5. Left great toe osteomyelitis with ulceration/PAD: Postoperative day #1 amputation left great toe with partial first ray resection  Plan for stent placement on Tuesday by vascular surgery   Continue Zosyn and vancomycin  6. Depression: Continue Zoloft   Management plans discussed with the patient and he is in agreement.  CODE STATUS: Full  TOTAL TIME TAKING CARE OF THIS PATIENT: 22 minutes.  Discussed with vascular   POSSIBLE D/C 2-3 days, DEPENDING ON CLINICAL CONDITION.   Jamarl Pew M.D on 03/18/2017 at 8:34  AM  Between 7am to  6pm - Pager - [phone] After 6pm go to [email or web address] - password [email or web address] [facility]  Office  [phone]  CC: Primary care physician; Venia Carbon, MD  Note: This dictation was prepared with Dragon dictation along with smaller phrase technology. Any transcriptional errors that result from this process are unintentional.

## 2017-03-18 NOTE — Progress Notes (Signed)
1 Day Post-Op   Subjective/Chief Complaint: Patient seen.  No complaints of any pain.   Objective: Vital signs in last 24 hours: Temp:  [97.6 F (36.4 C)-98.4 F (36.9 C)] 98.3 F (36.8 C) (02/17 0843) Pulse Rate:  [58-85] 74 (02/17 0843) Resp:  [12-20] 18 (02/17 0843) BP: (101-159)/(51-81) 159/81 (02/17 0843) SpO2:  [94 %-99 %] 94 % (02/17 0843) Last BM Date: 03/13/17  Intake/Output from previous day: 02/16 0701 - 02/17 0700 In: 2819.4 [P.O.:240; I.V.:1929.4; IV Piggyback:650] Out: 2450 [Urine:2450] Intake/Output this shift: Total I/O In: -  Out: 300 [Urine:300]  Only mild bleeding noted on the bandage.  The incision is well coapted with reduction in erythema and edema in the forefoot.  No purulence.  Lab Results:  Recent Labs    03/17/17 1642 03/18/17 0157  WBC 7.5 9.8  HGB 9.7* 9.8*  HCT 29.5* 30.0*  PLT 366 367   BMET Recent Labs    03/17/17 0346 03/18/17 0157  NA 141 137  K 4.0 4.2  CL 107 104  CO2 27 27  GLUCOSE 95 189*  BUN 20 19  CREATININE 1.02 1.01  CALCIUM 8.5* 7.9*   PT/INR Recent Labs    03/17/17 0346 03/18/17 0157  LABPROT 17.0* 16.8*  INR 1.40 1.37   ABG No results for input(s): PHART, HCO3 in the last 72 hours.  Invalid input(s): PCO2, PO2  Studies/Results: No results found.  Anti-infectives: Anti-infectives (From admission, onward)   Start     Dose/Rate Route Frequency Ordered Stop   03/17/17 1228  vancomycin (VANCOCIN) powder  Status:  Discontinued       As needed 03/17/17 1228 03/17/17 1259   03/16/17 1030  vancomycin (VANCOCIN) 1,250 mg in sodium chloride 0.9 % 250 mL IVPB     1,250 mg 166.7 mL/hr over 90 Minutes Intravenous Every 12 hours 03/16/17 0942     03/16/17 0000  vancomycin (VANCOCIN) 1,250 mg in sodium chloride 0.9 % 250 mL IVPB     1,250 mg 166.7 mL/hr over 90 Minutes Intravenous  Once 03/15/17 2350 03/16/17 0223   03/15/17 1245  piperacillin-tazobactam (ZOSYN) IVPB 3.375 g     3.375 g 12.5 mL/hr over 240  Minutes Intravenous Every 8 hours 03/15/17 1235        Assessment/Plan: s/p Procedure(s): FIRST RAY RESECTION LEFT FOOT (Left) Assessment: Stable status post first ray resection left foot   Plan: Sterile gauze dressing reapplied to the left foot.  Patient may have bathroom privileges with weightbearing on the left heel and his wedge surgical shoe.  Waiting for his vascular procedure on Tuesday.  LOS: 3 days    Durward Fortes 03/18/2017

## 2017-03-18 NOTE — Consult Note (Signed)
[facility] for Heparin dosing and monitoring  Indication: PMH of DVT   Patient Measurements: Heparin Dosing Weight: 94 kg  Vital Signs: Temp: 98.3 F (36.8 C) (02/17 1647) Temp Source: Oral (02/17 1647) BP: 131/54 (02/17 1647) Pulse Rate: 64 (02/17 1647)  Labs: Recent Labs    03/16/17 0333  03/17/17 0346 03/17/17 1642 03/18/17 0157 03/18/17 0906 03/18/17 1504  HGB 10.0*  --  10.4* 9.7* 9.8*  --   --   HCT 31.0*  --  31.2* 29.5* 30.0*  --   --   PLT 393  --  387 366 367  --   --   LABPROT 20.5*  --  17.0*  --  16.8*  --   --   INR 1.77  --  1.40  --  1.37  --   --   HEPARINUNFRC 0.39   < >  --   --  0.25* 0.51 0.65  CREATININE 1.18  --  1.02  --  1.01  --   --    < > = values in this interval not displayed.    Estimated Creatinine Clearance: 89.1 mL/min (by C-G formula based on SCr of 1.01 mg/dL).    Assessment: Pharmacy consulted for heparin dosing and monitoring in 67 yo male with PMH of DVT. Patient was taking warfarin 5mg  QD PTA. INR was subtherapeutic at 1.7 on admission.   Warfarin being held inpatient - underwent toe amputation on 2/16. Plan for possible angiogram on 2/19 per notes.  Goal of Therapy:  Heparin level 0.3-0.7 units/ml Monitor platelets by anticoagulation protocol: Yes   Plan:  Heparin resumed post-op on 2/16.  HL = 0.51 is therapeutic. Will continue heparin infusion at current rate of 1700 units/hr and order confirmatory HL in 6 hours. CBC stable from yesterday.  HL=0.65 @1504  is therapeutic. Will continue heparin infusion at current rate of 1700 units/hr. Recheck HL in 6 hours.   Candelaria Stagers, PharmD Pharmacy Resident  03/18/2017 5:00 PM

## 2017-03-19 ENCOUNTER — Encounter: Payer: Self-pay | Admitting: Podiatry

## 2017-03-19 LAB — GLUCOSE, CAPILLARY
GLUCOSE-CAPILLARY: 111 mg/dL — AB (ref 65–99)
GLUCOSE-CAPILLARY: 144 mg/dL — AB (ref 65–99)
Glucose-Capillary: 118 mg/dL — ABNORMAL HIGH (ref 65–99)
Glucose-Capillary: 157 mg/dL — ABNORMAL HIGH (ref 65–99)

## 2017-03-19 LAB — HEPARIN LEVEL (UNFRACTIONATED)
HEPARIN UNFRACTIONATED: 0.42 [IU]/mL (ref 0.30–0.70)
HEPARIN UNFRACTIONATED: 0.43 [IU]/mL (ref 0.30–0.70)
Heparin Unfractionated: 0.79 IU/mL — ABNORMAL HIGH (ref 0.30–0.70)

## 2017-03-19 LAB — BASIC METABOLIC PANEL
Anion gap: 9 (ref 5–15)
BUN: 16 mg/dL (ref 6–20)
CHLORIDE: 105 mmol/L (ref 101–111)
CO2: 25 mmol/L (ref 22–32)
Calcium: 8.2 mg/dL — ABNORMAL LOW (ref 8.9–10.3)
Creatinine, Ser: 0.86 mg/dL (ref 0.61–1.24)
GFR calc Af Amer: >60 mL/min (ref >60)
GFR calc non Af Amer: >60 mL/min (ref >60)
GLUCOSE: 121 mg/dL — AB (ref 65–99)
POTASSIUM: 3.8 mmol/L (ref 3.5–5.1)
Sodium: 139 mmol/L (ref 135–145)

## 2017-03-19 LAB — PROTIME-INR
INR: 1.28
Prothrombin Time: 15.9 seconds — ABNORMAL HIGH (ref 11.4–15.2)

## 2017-03-19 LAB — CBC
HEMATOCRIT: 29.8 % — AB (ref 40.0–52.0)
Hemoglobin: 9.9 g/dL — ABNORMAL LOW (ref 13.0–18.0)
MCH: 26.7 pg (ref 26.0–34.0)
MCHC: 33.4 g/dL (ref 32.0–36.0)
MCV: 80.1 fL (ref 80.0–100.0)
Platelets: 349 10*3/uL (ref 150–440)
RBC: 3.72 MIL/uL — ABNORMAL LOW (ref 4.40–5.90)
RDW: 14.6 % — AB (ref 11.5–14.5)
WBC: 8.1 10*3/uL (ref 3.8–10.6)

## 2017-03-19 LAB — VANCOMYCIN, TROUGH: Vancomycin Tr: 18 ug/mL (ref 15–20)

## 2017-03-19 LAB — MAGNESIUM: Magnesium: 1.9 mg/dL (ref 1.7–2.4)

## 2017-03-19 MED ORDER — SODIUM CHLORIDE 0.9 % IV SOLN: INTRAVENOUS | Status: DC

## 2017-03-19 MED ORDER — CEFAZOLIN SODIUM-DEXTROSE 1-4 GM/50ML-% IV SOLN
1.0000 g | INTRAVENOUS | Status: DC
  Filled 2017-03-19: qty 50

## 2017-03-19 NOTE — Consult Note (Signed)
**Note Benjamin-Identified via Obfuscation** Benjamin Davidson for Heparin dosing and monitoring  Indication: PMH of DVT   Patient Measurements: Heparin Dosing Weight: 94 kg  Vital Signs: Temp: 97.9 F (36.6 C) (02/17 1959) Temp Source: Oral (02/17 1959) BP: 136/68 (02/18 0014) Pulse Rate: 66 (02/18 0014)  Labs: Recent Labs    03/17/17 0346 03/17/17 1642 03/18/17 0157  03/18/17 1504 03/18/17 2120 03/19/17 0436  HGB 10.4* 9.7* 9.8*  --   --   --  9.9*  HCT 31.2* 29.5* 30.0*  --   --   --  29.8*  PLT 387 366 367  --   --   --  349  LABPROT 17.0*  --  16.8*  --   --   --  15.9*  INR 1.40  --  1.37  --   --   --  1.28  HEPARINUNFRC  --   --  0.25*   < > 0.65 0.59 0.79*  CREATININE 1.02  --  1.01  --   --   --  0.86   < > = values in this interval not displayed.    Estimated Creatinine Clearance: 104.7 mL/min (by C-G formula based on SCr of 0.86 mg/dL).    Assessment: Pharmacy consulted for heparin dosing and monitoring in 67 yo male with PMH of DVT. Patient was taking warfarin 5mg  QD PTA. INR was subtherapeutic at 1.7 on admission.   Warfarin being held inpatient - underwent toe amputation on 2/16. Plan for possible angiogram on 2/19 per notes.  Goal of Therapy:  Heparin level 0.3-0.7 units/ml Monitor platelets by anticoagulation protocol: Yes   Plan:  Heparin resumed post-op on 2/16.  HL = 0.51 is therapeutic. Will continue heparin infusion at current rate of 1700 units/hr and order confirmatory HL in 6 hours. CBC stable from yesterday.  HL=0.65 @1504  is therapeutic. Will continue heparin infusion at current rate of 1700 units/hr. Recheck HL in 6 hours.   02/18 @ 0430 HL 0.79 supratherapeutic. Will decrease rate back down to 1500 units/hr and will recheck HL @ 1100. CBC stable.  Tobie Lords, PharmD, BCPS Clinical Pharmacist 03/19/2017 5:55 AM

## 2017-03-19 NOTE — Progress Notes (Signed)
Pharmacy Antibiotic Note  Benjamin Davidson is a 67 y.o. male admitted on 03/15/2017 with left foot ulcer and need for toe amputation as well as LE revascularization. Patient has been on abx including CTX injection x 1 and Augmentin.  Pharmacy has been consulted for vancomycin dosing. Patient is also ordered Zosyn.    Plan: Vancomycin trough of 18 mcg/ml remains at goal of 15-20 mcg/ml. Will continue vancomycin 1250 mg iv q 12 hours. Will continue to follow renal function and order levels as clinically indicated.   Height: 5\' 8"  (172.7 cm) Weight: 251 lb 12.3 oz (114.2 kg) IBW/kg (Calculated) : 68.4  Temp (24hrs), Avg:98.1 F (36.7 C), Min:97.4 F (36.3 C), Max:98.4 F (36.9 C)  Recent Labs  Lab 03/15/17 1125 03/16/17 0333 03/17/17 0346 03/17/17 1642 03/17/17 2221 03/18/17 0157 03/19/17 0436 03/19/17 1045  WBC 10.8* 9.2 8.2 7.5  --  9.8 8.1  --   CREATININE 1.13 1.18 1.02  --   --  1.01 0.86  --   VANCOTROUGH  --   --   --   --  17  --   --  18    Estimated Creatinine Clearance: 105 mL/min (by C-G formula based on SCr of 0.86 mg/dL).    Allergies  Allergen Reactions  . Lisinopril     Cough Flu like symptoms     Antimicrobials this admission: Zosyn 2/14 >>  vancomycin 2/15 >>   Dose adjustments this admission:   Microbiology results:  Thank you for allowing pharmacy to be a part of this patient's care.  Ulice Dash, PharmD Clinical Pharmacist   Clinical Pharmacist 03/19/2017

## 2017-03-19 NOTE — Consult Note (Signed)
[place] for Heparin dosing and monitoring  Indication: PMH of DVT   Patient Measurements: Heparin Dosing Weight: 94 kg  Vital Signs: Temp: 98.3 F (36.8 C) (02/18 1157) Temp Source: Oral (02/18 1157) BP: 136/88 (02/18 1711) Pulse Rate: 79 (02/18 1711)  Labs: Recent Labs    03/17/17 0346 03/17/17 1642 03/18/17 0157  03/19/17 0436 03/19/17 1045 03/19/17 1657  HGB 10.4* 9.7* 9.8*  --  9.9*  --   --   HCT 31.2* 29.5* 30.0*  --  29.8*  --   --   PLT 387 366 367  --  349  --   --   LABPROT 17.0*  --  16.8*  --  15.9*  --   --   INR 1.40  --  1.37  --  1.28  --   --   HEPARINUNFRC  --   --  0.25*   < > 0.79* 0.42 0.43  CREATININE 1.02  --  1.01  --  0.86  --   --    < > = values in this interval not displayed.    Estimated Creatinine Clearance: 105 mL/min (by C-G formula based on SCr of 0.86 mg/dL).    Assessment: Pharmacy consulted for heparin dosing and monitoring in 67 yo male with PMH of DVT. Patient was taking warfarin 5mg  QD PTA. INR was subtherapeutic at 1.7 on admission.   Warfarin being held inpatient - underwent toe amputation on 2/16. Plan for possible angiogram on 2/19 per notes.  Goal of Therapy:  Heparin level 0.3-0.7 units/ml Monitor platelets by anticoagulation protocol: Yes   Plan:  Heparin resumed post-op on 2/16.  HL = 0.51 is therapeutic. Will continue heparin infusion at current rate of 1700 units/hr and order confirmatory HL in 6 hours. CBC stable from yesterday.  HL=0.65 @1504  is therapeutic. Will continue heparin infusion at current rate of 1700 units/hr. Recheck HL in 6 hours.   02/18 @ 0430 HL 0.79 supratherapeutic. Will decrease rate back down to 1500 units/hr and will recheck HL @ 1100. CBC stable.  2/18 @ 1210: HL 0.42 at goal. Will continue infusion at 1500 units/hr and recheck a HL in 6 hours.  2/18 1711 HL therapeutic. Continue current rate. Will recheck HL and CBC daily per protocol.   Keiston Manley  A. Jordan Hawks, PharmD Clinical Pharmacist  Clinical Pharmacist 03/19/2017 5:35 PM

## 2017-03-19 NOTE — Progress Notes (Signed)
[place] [facility] [place] NAME: Benjamin Davidson    MR#:  [id number]  DATE OF BIRTH:  Nov 06, 1950  SUBJECTIVE:   Patient voices no depression. Has flat affect. Voices no pain. Which she could go home. REVIEW OF SYSTEMS:    Review of Systems  Constitutional: Negative for fever, chills weight loss HENT: Negative for ear pain, nosebleeds, congestion, facial swelling, rhinorrhea, neck pain, neck stiffness and ear discharge.   Respiratory: Negative for cough, shortness of breath, wheezing  Cardiovascular: Negative for chest pain, palpitations and leg swelling.  Gastrointestinal: Negative for heartburn, abdominal pain, vomiting, diarrhea or consitpation Genitourinary: Negative for dysuria, urgency, frequency, hematuria Musculoskeletal: Negative for back pain or joint pain Neurological: Negative for dizziness, seizures, syncope, focal weakness,  numbness and headaches.  Hematological: Does not bruise/bleed easily.  Psychiatric/Behavioral: Negative for hallucinations, confusion, seems to have dysphoric mood    Tolerating Diet: Yes     DRUG ALLERGIES:   Allergies  Allergen Reactions  . Lisinopril     Cough Flu like symptoms     VITALS:  Blood pressure (!) 146/74, pulse 66, temperature 98.4 F (36.9 C), temperature source Oral, resp. rate 18, height 5\' 8"  (1.727 m), weight 114.2 kg (251 lb 12.3 oz), SpO2 97 %.  PHYSICAL EXAMINATION:  Constitutional: Appears well-developed and well-nourished. No distress. HENT: Normocephalic. Marland Kitchen Oropharynx is clear and moist.  Eyes: Conjunctivae and EOM are normal. PERRLA, no scleral icterus.  Neck: Normal ROM. Neck supple. No JVD. No tracheal deviation. CVS: RRR, S1/S2 +, no murmurs, no gallops, no carotid bruit.  Pulmonary: Effort and breath sounds normal, no stridor, rhonchi, wheezes, rales.  Abdominal: Soft. BS +,  no distension, tenderness, rebound or guarding.  Musculoskeletal: Normal range of motion. No  edema and no tenderness.  Neuro: Alert. CN 2-12 grossly intact. No focal deficits. Skin: Foot is wrapped in Kerlix and Ace wrap  Psychiatric: Normal mood and affect.      LABORATORY PANEL:   CBC Recent Labs  Lab 03/19/17 0436  WBC 8.1  HGB 9.9*  HCT 29.8*  PLT 349   ------------------------------------------------------------------------------------------------------------------  Chemistries  Recent Labs  Lab 03/19/17 0436  NA 139  K 3.8  CL 105  CO2 25  GLUCOSE 121*  BUN 16  CREATININE 0.86  CALCIUM 8.2*  MG 1.9   ------------------------------------------------------------------------------------------------------------------  Cardiac Enzymes No results for input(s): TROPONINI in the last 168 hours. ------------------------------------------------------------------------------------------------------------------  RADIOLOGY:  No results found.   ASSESSMENT AND PLAN:   67 year old male with history of diabetes and peripheral vascular disease who was admitted for ulceration with osteomyelitis left great toe.  1. Diabetes: Continue Levemir, sliding scale and NovoLog Continue ADA diet A1c 7.4   2. Essential hypertension: Continue Coreg and Cozaar  3. History of left leg DVT: Currently on heparin drip Restart Coumadin once okay with podiatry/vascular    4. CAD: Continue aspirin, Coreg, Cozaar and statin  5. Left great toe osteomyelitis with ulceration/PAD: Postoperative day #2 amputation left great toe with partial first ray resection  Plan for angiography and stent placement on Tuesday by vascular surgery   Continue Zosyn and vancomycin  6. Depression: Continue Zoloft He does not want psychiatry evaluation for depression Management plans discussed with the patient and he is in agreement.  CODE STATUS: Full  TOTAL TIME TAKING CARE OF THIS PATIENT: 22 minutes.     POSSIBLE D/C 2-3 days, DEPENDING ON CLINICAL CONDITION.   Djeneba Barsch M.D on  03/19/2017 at 9:09  AM  Between 7am to 6pm - Pager - [phone] After 6pm go to [email or web address] - password [email or web address] [facility]  Office  [phone]  CC: Primary care physician; Venia Carbon, MD  Note: This dictation was prepared with Dragon dictation along with smaller phrase technology. Any transcriptional errors that result from this process are unintentional.

## 2017-03-19 NOTE — Care Management Important Message (Signed)
Important Message  Patient Details  Name: Benjamin Davidson MRN: [id number] Date of Birth: 05/24/1950   Medicare Important Message Given:  Yes    Marshell Garfinkel, RN 03/19/2017, 7:36 AM

## 2017-03-19 NOTE — Care Management Note (Signed)
Case Management Note  Patient Details  Name: Benjamin Davidson MRN: [id number] Date of Birth: [birth year]  Subjective/Objective:                  RNCM met with patient to discuss transition of care.  He seemed oriented however somewhat flat. He denied having any questions at this time. He is from home alone. He states he has support from two sisters if needed.  He has a cane but no other DME.  PCP is Dr. Silvio Pate.  Action/Plan:   Home health list provided. RNCM will follow.   Expected Discharge Date:  03/17/17               Expected Discharge Plan:     In-House Referral:     Discharge planning Services  CM Consult  Post Acute Care Choice:  Durable Medical Equipment, Home Health Choice offered to:  Patient  DME Arranged:    DME Agency:     HH Arranged:    [place] Agency:     Status of Service:  In process, will continue to follow  If discussed at Long Length of Stay Meetings, dates discussed:    Additional Comments:  Marshell Garfinkel, RN 03/19/2017, 12:32 PM

## 2017-03-19 NOTE — Progress Notes (Signed)
Spoke with MD during procedure tomorrow, patient stated he has questions. MD ok to get consent 2/19

## 2017-03-19 NOTE — Consult Note (Signed)
[facility] for Heparin dosing and monitoring  Indication: PMH of DVT   Patient Measurements: Heparin Dosing Weight: 94 kg  Vital Signs: Temp: 98.3 F (36.8 C) (02/18 1157) Temp Source: Oral (02/18 1157) BP: 152/74 (02/18 1157) Pulse Rate: 68 (02/18 1157)  Labs: Recent Labs    03/17/17 0346 03/17/17 1642 03/18/17 0157  03/18/17 2120 03/19/17 0436 03/19/17 1045  HGB 10.4* 9.7* 9.8*  --   --  9.9*  --   HCT 31.2* 29.5* 30.0*  --   --  29.8*  --   PLT 387 366 367  --   --  349  --   LABPROT 17.0*  --  16.8*  --   --  15.9*  --   INR 1.40  --  1.37  --   --  1.28  --   HEPARINUNFRC  --   --  0.25*   < > 0.59 0.79* 0.42  CREATININE 1.02  --  1.01  --   --  0.86  --    < > = values in this interval not displayed.    Estimated Creatinine Clearance: 105 mL/min (by C-G formula based on SCr of 0.86 mg/dL).    Assessment: Pharmacy consulted for heparin dosing and monitoring in 67 yo male with PMH of DVT. Patient was taking warfarin 5mg  QD PTA. INR was subtherapeutic at 1.7 on admission.   Warfarin being held inpatient - underwent toe amputation on 2/16. Plan for possible angiogram on 2/19 per notes.  Goal of Therapy:  Heparin level 0.3-0.7 units/ml Monitor platelets by anticoagulation protocol: Yes   Plan:  Heparin resumed post-op on 2/16.  HL = 0.51 is therapeutic. Will continue heparin infusion at current rate of 1700 units/hr and order confirmatory HL in 6 hours. CBC stable from yesterday.  HL=0.65 @1504  is therapeutic. Will continue heparin infusion at current rate of 1700 units/hr. Recheck HL in 6 hours.   02/18 @ 0430 HL 0.79 supratherapeutic. Will decrease rate back down to 1500 units/hr and will recheck HL @ 1100. CBC stable.  2/18 @ 1210: HL 0.42 at goal. Will continue infusion at 1500 units/hr and recheck a HL in 6 hours.   Ulice Dash, PharmD Clinical Pharmacist  Clinical Pharmacist 03/19/2017 12:10 PM

## 2017-03-19 NOTE — Progress Notes (Signed)
2 Days Post-Op   Subjective/Chief Complaint: Patient seen.  No significant complaints of pain.  Could not remember my name today.   Objective: Vital signs in last 24 hours: Temp:  [97.4 F (36.3 C)-98.4 F (36.9 C)] 98.3 F (36.8 C) (02/18 1157) Pulse Rate:  [64-69] 68 (02/18 1157) Resp:  [18-20] 20 (02/18 1157) BP: (131-160)/(54-80) 152/74 (02/18 1157) SpO2:  [95 %-98 %] 95 % (02/18 1157) Weight:  [114.2 kg (251 lb 12.3 oz)] 114.2 kg (251 lb 12.3 oz) (02/18 0500) Last BM Date: 03/17/17  Intake/Output from previous day: 02/17 0701 - 02/18 0700 In: 3845 [P.O.:1320; I.V.:1875; IV Piggyback:650] Out: 3125 [Urine:3125] Intake/Output this shift: Total I/O In: 240 [P.O.:240] Out: 700 [Urine:700]  Dressing on the left foot is dry and intact.  Lab Results:  Recent Labs    03/18/17 0157 03/19/17 0436  WBC 9.8 8.1  HGB 9.8* 9.9*  HCT 30.0* 29.8*  PLT 367 349   BMET Recent Labs    03/18/17 0157 03/19/17 0436  NA 137 139  K 4.2 3.8  CL 104 105  CO2 27 25  GLUCOSE 189* 121*  BUN 19 16  CREATININE 1.01 0.86  CALCIUM 7.9* 8.2*   PT/INR Recent Labs    03/18/17 0157 03/19/17 0436  LABPROT 16.8* 15.9*  INR 1.37 1.28   ABG No results for input(s): PHART, HCO3 in the last 72 hours.  Invalid input(s): PCO2, PO2  Studies/Results: No results found.  Anti-infectives: Anti-infectives (From admission, onward)   Start     Dose/Rate Route Frequency Ordered Stop   03/17/17 1228  vancomycin (VANCOCIN) powder  Status:  Discontinued       As needed 03/17/17 1228 03/17/17 1259   03/16/17 1030  vancomycin (VANCOCIN) 1,250 mg in sodium chloride 0.9 % 250 mL IVPB     1,250 mg 166.7 mL/hr over 90 Minutes Intravenous Every 12 hours 03/16/17 0942     03/16/17 0000  vancomycin (VANCOCIN) 1,250 mg in sodium chloride 0.9 % 250 mL IVPB     1,250 mg 166.7 mL/hr over 90 Minutes Intravenous  Once 03/15/17 2350 03/16/17 0223   03/15/17 1245  piperacillin-tazobactam (ZOSYN) IVPB  3.375 g     3.375 g 12.5 mL/hr over 240 Minutes Intravenous Every 8 hours 03/15/17 1235        Assessment/Plan: s/p Procedure(s): FIRST RAY RESECTION LEFT FOOT (Left) Assessment: Stable status post first ray resection left foot.   Plan: Dressing left intact.  Plan for dressing change tomorrow.  Waiting on vascular intervention tomorrow as well.  LOS: 4 days    Durward Fortes 03/19/2017

## 2017-03-20 ENCOUNTER — Encounter
Admission: AD | Disposition: A | Discharge status: Home / Self Care | Payer: Self-pay | Source: Ambulatory Visit | Attending: Vascular Surgery

## 2017-03-20 ENCOUNTER — Ambulatory Visit: Payer: Medicare HMO | Admitting: Internal Medicine

## 2017-03-20 ENCOUNTER — Ambulatory Visit: Payer: Medicare HMO

## 2017-03-20 DIAGNOSIS — I70248 Atherosclerosis of native arteries of left leg with ulceration of other part of lower left leg: Secondary | ICD-10-CM

## 2017-03-20 HISTORY — PX: PERIPHERAL VASCULAR BALLOON ANGIOPLASTY: CATH118281

## 2017-03-20 LAB — MAGNESIUM: Magnesium: 1.9 mg/dL (ref 1.7–2.4)

## 2017-03-20 LAB — BASIC METABOLIC PANEL
Anion gap: 8 (ref 5–15)
BUN: 17 mg/dL (ref 6–20)
CHLORIDE: 105 mmol/L (ref 101–111)
CO2: 25 mmol/L (ref 22–32)
CREATININE: 1.02 mg/dL (ref 0.61–1.24)
Calcium: 8.3 mg/dL — ABNORMAL LOW (ref 8.9–10.3)
GFR calc Af Amer: >60 mL/min (ref >60)
GFR calc non Af Amer: >60 mL/min (ref >60)
Glucose, Bld: 144 mg/dL — ABNORMAL HIGH (ref 65–99)
POTASSIUM: 3.7 mmol/L (ref 3.5–5.1)
SODIUM: 138 mmol/L (ref 135–145)

## 2017-03-20 LAB — PROTIME-INR
INR: 1.23
Prothrombin Time: 15.4 seconds — ABNORMAL HIGH (ref 11.4–15.2)

## 2017-03-20 LAB — CBC
HEMATOCRIT: 30.4 % — AB (ref 40.0–52.0)
Hemoglobin: 9.9 g/dL — ABNORMAL LOW (ref 13.0–18.0)
MCH: 26.3 pg (ref 26.0–34.0)
MCHC: 32.6 g/dL (ref 32.0–36.0)
MCV: 80.5 fL (ref 80.0–100.0)
PLATELETS: 343 10*3/uL (ref 150–440)
RBC: 3.78 MIL/uL — AB (ref 4.40–5.90)
RDW: 14.7 % — AB (ref 11.5–14.5)
WBC: 9.3 10*3/uL (ref 3.8–10.6)

## 2017-03-20 LAB — GLUCOSE, CAPILLARY
GLUCOSE-CAPILLARY: 130 mg/dL — AB (ref 65–99)
GLUCOSE-CAPILLARY: 152 mg/dL — AB (ref 65–99)
Glucose-Capillary: 132 mg/dL — ABNORMAL HIGH (ref 65–99)

## 2017-03-20 LAB — HEPARIN LEVEL (UNFRACTIONATED): HEPARIN UNFRACTIONATED: 0.24 [IU]/mL — AB (ref 0.30–0.70)

## 2017-03-20 SURGERY — PERIPHERAL VASCULAR BALLOON ANGIOPLASTY
Anesthesia: Moderate Sedation | Laterality: Left

## 2017-03-20 MED ORDER — DOXYCYCLINE HYCLATE 100 MG PO TABS: 100.0000 mg | ORAL_TABLET | Freq: Two times a day (BID) | ORAL | 2 refills | Status: DC

## 2017-03-20 MED ORDER — MIDAZOLAM HCL 5 MG/5ML IJ SOLN
INTRAMUSCULAR | Status: AC
  Filled 2017-03-20: qty 5

## 2017-03-20 MED ORDER — HEPARIN SODIUM (PORCINE) 1000 UNIT/ML IJ SOLN
INTRAMUSCULAR | Status: DC | PRN
  Administered 2017-03-20: 5000 [IU] via INTRAVENOUS

## 2017-03-20 MED ORDER — LIDOCAINE HCL (PF) 1 % IJ SOLN
INTRAMUSCULAR | Status: AC
Start: 2017-03-20 — End: 2017-03-20
  Filled 2017-03-20: qty 30

## 2017-03-20 MED ORDER — FENTANYL CITRATE (PF) 100 MCG/2ML IJ SOLN
INTRAMUSCULAR | Status: DC | PRN
  Administered 2017-03-20 (×2): 50 ug via INTRAVENOUS

## 2017-03-20 MED ORDER — DOXYCYCLINE HYCLATE 100 MG PO TABS
100.0000 mg | ORAL_TABLET | Freq: Two times a day (BID) | ORAL | Status: DC
Start: 2017-03-20 — End: 2017-03-20
  Administered 2017-03-20: 100 mg via ORAL
  Filled 2017-03-20: qty 1

## 2017-03-20 MED ORDER — CLOPIDOGREL BISULFATE 300 MG PO TABS
300.0000 mg | ORAL_TABLET | ORAL | Status: AC
  Filled 2017-03-20: qty 1

## 2017-03-20 MED ORDER — HEPARIN (PORCINE) IN NACL 2-0.9 UNIT/ML-% IJ SOLN
INTRAMUSCULAR | Status: AC
  Filled 2017-03-20: qty 1000

## 2017-03-20 MED ORDER — SODIUM CHLORIDE 0.9 % IV SOLN: 250.0000 mL | INTRAVENOUS | Status: DC | PRN

## 2017-03-20 MED ORDER — ONDANSETRON HCL 4 MG/2ML IJ SOLN: 4.0000 mg | Freq: Four times a day (QID) | INTRAMUSCULAR | Status: DC | PRN

## 2017-03-20 MED ORDER — SODIUM CHLORIDE 0.9 % IV SOLN: INTRAVENOUS | Status: AC

## 2017-03-20 MED ORDER — CLOPIDOGREL BISULFATE 75 MG PO TABS: 75.0000 mg | ORAL_TABLET | Freq: Every day | ORAL | 4 refills | Status: DC

## 2017-03-20 MED ORDER — HEPARIN BOLUS VIA INFUSION
1400.0000 [IU] | Freq: Once | INTRAVENOUS | Status: AC
Start: 2017-03-20 — End: 2017-03-20
  Administered 2017-03-20: 1400 [IU] via INTRAVENOUS
  Filled 2017-03-20: qty 1400

## 2017-03-20 MED ORDER — MIDAZOLAM HCL 2 MG/2ML IJ SOLN
INTRAMUSCULAR | Status: DC | PRN
  Administered 2017-03-20 (×2): 2 mg via INTRAVENOUS

## 2017-03-20 MED ORDER — SODIUM CHLORIDE 0.9% FLUSH: 3.0000 mL | INTRAVENOUS | Status: DC | PRN

## 2017-03-20 MED ORDER — HEPARIN SODIUM (PORCINE) 1000 UNIT/ML IJ SOLN
INTRAMUSCULAR | Status: AC
  Filled 2017-03-20: qty 1

## 2017-03-20 MED ORDER — CLOPIDOGREL BISULFATE 75 MG PO TABS
ORAL_TABLET | ORAL | Status: AC
  Administered 2017-03-20: 300 mg via ORAL
  Filled 2017-03-20: qty 4

## 2017-03-20 MED ORDER — FENTANYL CITRATE (PF) 100 MCG/2ML IJ SOLN
INTRAMUSCULAR | Status: AC
  Filled 2017-03-20: qty 2

## 2017-03-20 MED ORDER — SODIUM CHLORIDE 0.9 % IV SOLN
INTRAVENOUS | Status: AC | PRN
  Administered 2017-03-20: 250 mL via INTRAVENOUS

## 2017-03-20 MED ORDER — ASPIRIN EC 81 MG PO TBEC: 81.0000 mg | DELAYED_RELEASE_TABLET | Freq: Every day | ORAL | 2 refills | Status: AC

## 2017-03-20 MED ORDER — CLOPIDOGREL BISULFATE 75 MG PO TABS: 75.0000 mg | ORAL_TABLET | Freq: Every day | ORAL | Status: DC

## 2017-03-20 MED ORDER — ACETAMINOPHEN 325 MG PO TABS: 650.0000 mg | ORAL_TABLET | ORAL | Status: DC | PRN

## 2017-03-20 MED ORDER — OXYCODONE HCL 5 MG PO TABS: 5.0000 mg | ORAL_TABLET | Freq: Four times a day (QID) | ORAL | 0 refills | Status: DC | PRN

## 2017-03-20 MED ORDER — CEFAZOLIN SODIUM-DEXTROSE 2-4 GM/100ML-% IV SOLN
2.0000 g | INTRAVENOUS | Status: DC
  Filled 2017-03-20: qty 100

## 2017-03-20 MED ORDER — CLOPIDOGREL BISULFATE 300 MG PO TABS
300.0000 mg | ORAL_TABLET | ORAL | Status: AC
  Administered 2017-03-20: 300 mg via ORAL

## 2017-03-20 MED ORDER — SODIUM CHLORIDE 0.9% FLUSH: 3.0000 mL | Freq: Two times a day (BID) | INTRAVENOUS | Status: DC

## 2017-03-20 SURGICAL SUPPLY — 26 items
BALLN ULTRVRSE 2.5X40X150 (BALLOONS) ×2
BALLN ULTRVRSE 3X40X150 (BALLOONS) ×2
BALLN ULTRVRSE 3X40X150 OTW (BALLOONS) ×1
BALLOON ULTRVRSE 150X40X2.5 (BALLOONS) IMPLANT
BALLOON ULTRVRSE 3X40X150 OTW (BALLOONS) IMPLANT
CATH CXI SUPP ST 2.6FR 150CM (CATHETERS) ×1 IMPLANT
CATH PIG 70CM (CATHETERS) ×1 IMPLANT
CATH VERT 5FR 125CM (CATHETERS) ×1 IMPLANT
COVER PROBE U/S 5X48 (MISCELLANEOUS) ×1 IMPLANT
DEVICE PRESTO INFLATION (MISCELLANEOUS) ×1 IMPLANT
DEVICE STARCLOSE SE CLOSURE (Vascular Products) ×1 IMPLANT
DEVICE TORQUE (MISCELLANEOUS) ×1 IMPLANT
GUIDEWIRE LT ZIPWIRE 035X260 (WIRE) ×1 IMPLANT
GUIDEWIRE SUPER STIFF .035X180 (WIRE) ×1 IMPLANT
NDL ENTRY 21GA 7CM ECHOTIP (NEEDLE) IMPLANT
NEEDLE ENTRY 21GA 7CM ECHOTIP (NEEDLE) ×2 IMPLANT
PACK ANGIOGRAPHY (CUSTOM PROCEDURE TRAY) ×1 IMPLANT
SET INTRO CAPELLA COAXIAL (SET/KITS/TRAYS/PACK) ×1 IMPLANT
SHEATH BRITE TIP 5FRX11 (SHEATH) ×1 IMPLANT
SHEATH BRITE TIP 6FRX11 (SHEATH) ×1 IMPLANT
SHEATH RAABE 6FR (SHEATH) ×1 IMPLANT
SHIELD RADPAD SCOOP 12X17 (MISCELLANEOUS) ×1 IMPLANT
TOWEL OR 17X26 4PK STRL BLUE (TOWEL DISPOSABLE) ×1 IMPLANT
TUBING CONTRAST HIGH PRESS 72 (TUBING) ×1 IMPLANT
WIRE COMMAND ST 018 300CM (WIRE) ×1 IMPLANT
WIRE J 3MM .035X145CM (WIRE) ×1 IMPLANT

## 2017-03-20 NOTE — Progress Notes (Signed)
Pt ready for d/c home tonight per Dr. Delana Meyer. Pt's right femoral site has had no issues, CDI, +3 dp pulses. Reviewed pt's discharge instructions and prescriptions with pt and his sister, all questions answered. PIV removed. Clarified with Dr. Delana Meyer that pt is to take 81 mg aspirin, plavix, and coumadin. Pt's VSS post cath. Pt ambulating putting weight through heel in post op shoe with cane with supervision. Assisted to car via NT.   [place], Jerry Caras

## 2017-03-20 NOTE — H&P (Signed)
[facility] History & Physical Update  The patient was interviewed and re-examined.  The patient's previous History and Physical has been reviewed and is unchanged.  There is no change in the plan of care. We plan to proceed with the scheduled procedure.  Hortencia Pilar, MD  03/20/2017, 10:44 AM

## 2017-03-20 NOTE — Progress Notes (Addendum)
Pt returned to room 146 from specials recovery. Pt is A&Ox4, right femoral site dressing CDI, pt denies pain, has +3 dp pulse. After 2pm, pt ambulated to BR with nursing assistance and voided and had BM. Per report, Dr. Delana Meyer ordered plavix to be given, and for heparin drip should be d/c.  [place], Jerry Caras

## 2017-03-20 NOTE — Progress Notes (Signed)
Pharmacy Dosing Adjustment Perioperative Antibiotic  67 y/o M ordered cefazolin 1 g on call to procedure.   Filed Weights   03/17/17 0412 03/19/17 0500 03/20/17 0336  Weight: 249 lb 11.2 oz (113.3 kg) 251 lb 12.3 oz (114.2 kg) 246 lb 4.1 oz (111.7 kg)   Estimated Creatinine Clearance: 87.5 mL/min (by C-G formula based on SCr of 1.02 mg/dL).  Will transition patient to cefazolin 2 g iv once for perioperative prophylaxis per guidelines for patients < or = 120 kg.   Ulice Dash, PharmD Clinical Pharmacist

## 2017-03-20 NOTE — Progress Notes (Signed)
Inpatient Diabetes Program Recommendations  AACE/ADA: New Consensus Statement on Inpatient Glycemic Control (2015)  Target Ranges:  Prepandial:   less than 140 mg/dL      Peak postprandial:   less than 180 mg/dL (1-2 hours)      Critically ill patients:  140 - 180 mg/dL   Lab Results  Component Value Date   GLUCAP 132 (H) 03/20/2017   HGBA1C 7.4 (H) 02/06/2017    Review of Glycemic Control  Results for Benjamin Davidson, Benjamin Davidson (MRN [id number]) as of 03/20/2017 09:32  Ref. Range 03/19/2017 07:56 03/19/2017 11:55 03/19/2017 17:11 03/19/2017 21:18 03/20/2017 08:11  Glucose-Capillary Latest Ref Range: 65 - 99 mg/dL 111 (H) 144 (H) 118 (H) 157 (H) 132 (H)   Diabetes history:Type 2  Outpatient Diabetes medications:Novolin 70/30 35 units bid, Novolin R as needed(sliding scale - ordered by Dr. Rockey Situ) - has used it in the past couple of weeks  Current orders for Inpatient glycemic control:Levemir 20 units bid, Novolog 6 units tid,  Novolog 0-9 units tid, Novolog 0-5 units qhs  Inpatient Diabetes Program Recommendations:CBG ideal-Agree with current medications for blood sugar management.   Gentry Fitz, RN, BA, MHA, CDE Diabetes Coordinator Inpatient Diabetes Program  [phone] (Team Pager) [phone] ([place]) 03/20/2017 9:32 AM

## 2017-03-20 NOTE — Consult Note (Signed)
[facility] for Heparin dosing and monitoring  Indication: PMH of DVT   Patient Measurements: Heparin Dosing Weight: 94 kg  Vital Signs: Temp: 98.4 F (36.9 C) (02/19 0336) Temp Source: Oral (02/19 0336) BP: 137/61 (02/19 0336) Pulse Rate: 67 (02/19 0336)  Labs: Recent Labs    03/18/17 0157  03/19/17 0436 03/19/17 1045 03/19/17 1657 03/20/17 0348  HGB 9.8*  --  9.9*  --   --  9.9*  HCT 30.0*  --  29.8*  --   --  30.4*  PLT 367  --  349  --   --  343  LABPROT 16.8*  --  15.9*  --   --  15.4*  INR 1.37  --  1.28  --   --  1.23  HEPARINUNFRC 0.25*   < > 0.79* 0.42 0.43 0.24*  CREATININE 1.01  --  0.86  --   --  1.02   < > = values in this interval not displayed.    Estimated Creatinine Clearance: 87.5 mL/min (by C-G formula based on SCr of 1.02 mg/dL).    Assessment: Pharmacy consulted for heparin dosing and monitoring in 67 yo male with PMH of DVT. Patient was taking warfarin 5mg  QD PTA. INR was subtherapeutic at 1.7 on admission.   Warfarin being held inpatient - underwent toe amputation on 2/16. Plan for possible angiogram on 2/19 per notes.  Goal of Therapy:  Heparin level 0.3-0.7 units/ml Monitor platelets by anticoagulation protocol: Yes   Plan:  Heparin resumed post-op on 2/16.  HL = 0.51 is therapeutic. Will continue heparin infusion at current rate of 1700 units/hr and order confirmatory HL in 6 hours. CBC stable from yesterday.  HL=0.65 @1504  is therapeutic. Will continue heparin infusion at current rate of 1700 units/hr. Recheck HL in 6 hours.   02/18 @ 0430 HL 0.79 supratherapeutic. Will decrease rate back down to 1500 units/hr and will recheck HL @ 1100. CBC stable.  2/18 @ 1210: HL 0.42 at goal. Will continue infusion at 1500 units/hr and recheck a HL in 6 hours.  2/18 1711 HL therapeutic. Continue current rate. Will recheck HL and CBC daily per protocol.   2/19 0400 heparin level 0.24. 1400 unit bolus and  increase rate to 1700 units/hr. Recheck in 6 hours.  Sim Boast, PharmD, BCPS  03/20/17 5:35 AM

## 2017-03-20 NOTE — Progress Notes (Signed)
Day of Surgery   Subjective/Chief Complaint: Patient seen.  Just finished with his vascular surgery.  No complaints of pain in his foot.   Objective: Vital signs in last 24 hours: Temp:  [97.9 F (36.6 C)-98.5 F (36.9 C)] 98.1 F (36.7 C) (02/19 1019) Pulse Rate:  [57-85] 57 (02/19 1245) Resp:  [10-18] 11 (02/19 1245) BP: (129-162)/(61-96) 136/65 (02/19 1245) SpO2:  [94 %-99 %] 97 % (02/19 1245) Weight:  [111.7 kg (246 lb 4.1 oz)] 111.7 kg (246 lb 4.1 oz) (02/19 0336) Last BM Date: 03/17/17  Intake/Output from previous day: 02/18 0701 - 02/19 0700 In: 3248.8 [P.O.:960; I.V.:1938.8; IV Piggyback:350] Out: 3025 [Urine:3025] Intake/Output this shift: Total I/O In: 851.4 [I.V.:851.4] Out: 900 [Urine:900]  Dressing on the left foot is dry and intact.  Upon removal there is minimal bleeding on the bandaging.  The incision is well coapted with significant improvement in erythema and edema.  Lab Results:  Recent Labs    03/19/17 0436 03/20/17 0348  WBC 8.1 9.3  HGB 9.9* 9.9*  HCT 29.8* 30.4*  PLT 349 343   BMET Recent Labs    03/19/17 0436 03/20/17 0348  NA 139 138  K 3.8 3.7  CL 105 105  CO2 25 25  GLUCOSE 121* 144*  BUN 16 17  CREATININE 0.86 1.02  CALCIUM 8.2* 8.3*   PT/INR Recent Labs    03/19/17 0436 03/20/17 0348  LABPROT 15.9* 15.4*  INR 1.28 1.23   ABG No results for input(s): PHART, HCO3 in the last 72 hours.  Invalid input(s): PCO2, PO2  Studies/Results: No results found.  Anti-infectives: Anti-infectives (From admission, onward)   Start     Dose/Rate Route Frequency Ordered Stop   03/20/17 0830  ceFAZolin (ANCEF) IVPB 2g/100 mL premix  Status:  Discontinued     2 g 200 mL/hr over 30 Minutes Intravenous On call 03/20/17 0826 03/20/17 1102   03/20/17 0600  ceFAZolin (ANCEF) IVPB 1 g/50 mL premix  Status:  Discontinued    Comments:  Send with pt to OR   1 g 100 mL/hr over 30 Minutes Intravenous On call 03/19/17 1605 03/20/17 0826   03/17/17 1228  vancomycin (VANCOCIN) powder  Status:  Discontinued       As needed 03/17/17 1228 03/17/17 1259   03/16/17 1030  [MAR Hold]  vancomycin (VANCOCIN) 1,250 mg in sodium chloride 0.9 % 250 mL IVPB     (MAR Hold since 03/20/17 1019)   1,250 mg 166.7 mL/hr over 90 Minutes Intravenous Every 12 hours 03/16/17 0942     03/16/17 0000  vancomycin (VANCOCIN) 1,250 mg in sodium chloride 0.9 % 250 mL IVPB     1,250 mg 166.7 mL/hr over 90 Minutes Intravenous  Once 03/15/17 2350 03/16/17 0223   03/15/17 1245  [MAR Hold]  piperacillin-tazobactam (ZOSYN) IVPB 3.375 g     (MAR Hold since 03/20/17 1018)   3.375 g 12.5 mL/hr over 240 Minutes Intravenous Every 8 hours 03/15/17 1235        Assessment/Plan: s/p Procedure(s): PERIPHERAL VASCULAR BALLOON ANGIOPLASTY (Left) Assessment: Stable status post first ray resection left foot.   Plan: Betadine and a sterile bandage reapplied to the left foot.  Patient was instructed to keep this clean and dry until seen in the office.  Patient should be stable for discharge as far as his foot is concerned.  Recommend doxycycline orally.  Plan for follow-up outpatient at [facility] clinic in 1 week  LOS: 5 days    Lyda Kalata  Cleda Mccreedy 03/20/2017

## 2017-03-20 NOTE — Progress Notes (Signed)
[place] [facility] [place] NAME: Benjamin Davidson    MR#:  [id number]  DATE OF BIRTH:  12-12-1950  SUBJECTIVE:   No acute events overnight. REVIEW OF SYSTEMS:    Review of Systems  Constitutional: Negative for fever, chills weight loss HENT: Negative for ear pain, nosebleeds, congestion, facial swelling, rhinorrhea, neck pain, neck stiffness and ear discharge.   Respiratory: Negative for cough, shortness of breath, wheezing  Cardiovascular: Negative for chest pain, palpitations and leg swelling.  Gastrointestinal: Negative for heartburn, abdominal pain, vomiting, diarrhea or consitpation Genitourinary: Negative for dysuria, urgency, frequency, hematuria Musculoskeletal: Negative for back pain or joint pain Neurological: Negative for dizziness, seizures, syncope, focal weakness,  numbness and headaches.  Hematological: Does not bruise/bleed easily.  Psychiatric/Behavioral: Negative for hallucinations, confusion, seems to have dysphoric mood    Tolerating Diet: Nothing by mouth     DRUG ALLERGIES:   Allergies  Allergen Reactions  . Lisinopril     Cough Flu like symptoms     VITALS:  Blood pressure 137/61, pulse 67, temperature 98.4 F (36.9 C), temperature source Oral, resp. rate 18, height 5\' 8"  (1.727 m), weight 111.7 kg (246 lb 4.1 oz), SpO2 97 %.  PHYSICAL EXAMINATION:  Constitutional: Appears well-developed and well-nourished. No distress. HENT: Normocephalic. Marland Kitchen Oropharynx is clear and moist.  Eyes: Conjunctivae and EOM are normal. PERRLA, no scleral icterus.  Neck: Normal ROM. Neck supple. No JVD. No tracheal deviation. CVS: RRR, S1/S2 +, no murmurs, no gallops, no carotid bruit.  Pulmonary: Effort and breath sounds normal, no stridor, rhonchi, wheezes, rales.  Abdominal: Soft. BS +,  no distension, tenderness, rebound or guarding.  Musculoskeletal: Normal range of motion. No edema and no tenderness.  Neuro: Alert. CN 2-12  grossly intact. No focal deficits. Skin: Foot is wrapped in Kerlix and Ace wrap  Psychiatric: Flat affect   LABORATORY PANEL:   CBC Recent Labs  Lab 03/20/17 0348  WBC 9.3  HGB 9.9*  HCT 30.4*  PLT 343   ------------------------------------------------------------------------------------------------------------------  Chemistries  Recent Labs  Lab 03/20/17 0348  NA 138  K 3.7  CL 105  CO2 25  GLUCOSE 144*  BUN 17  CREATININE 1.02  CALCIUM 8.3*  MG 1.9   ------------------------------------------------------------------------------------------------------------------  Cardiac Enzymes No results for input(s): TROPONINI in the last 168 hours. ------------------------------------------------------------------------------------------------------------------  RADIOLOGY:  No results found.   ASSESSMENT AND PLAN:   67 year old male with history of diabetes and peripheral vascular disease who was admitted for ulceration with osteomyelitis left great toe.  1. Diabetes: Continue Levemir, sliding scale and NovoLog Continue ADA diet A1c 7.4   2. Essential hypertension: Continue Coreg and Cozaar  3. History of left leg DVT: Currently on heparin drip Restart Coumadin once okay with podiatry/vascular    4. CAD: Continue aspirin, Coreg, Cozaar and statin  5. Left great toe osteomyelitis with ulceration/PAD: Postoperative day #3 amputation left great toe with partial first ray resection  Plan for dressing change today Plan for angiography and stent placement today by vascular surgery   Continue Zosyn and vancomycin, probably Cipro and Augmentin at discharge.  6. Depression: Continue Zoloft He does not want psychiatry evaluation for depression Management plans discussed with the patient and he is in agreement.  CODE STATUS: Full  TOTAL TIME TAKING CARE OF THIS PATIENT: 22 minutes.     POSSIBLE D/C tomorrow home, DEPENDING ON CLINICAL CONDITION.   [name],  [name] M.D on 03/20/2017 at 8:19 AM  Between 7am to  6pm - Pager - [phone] After 6pm go to [email or web address] - password [email or web address] [facility]  Office  [phone]  CC: Primary care physician; Venia Carbon, MD  Note: This dictation was prepared with Dragon dictation along with smaller phrase technology. Any transcriptional errors that result from this process are unintentional.

## 2017-03-20 NOTE — Op Note (Signed)
[facility] Percutaneous Study/Intervention Procedural Note   Date of Surgery: 03/20/2017  Surgeon: Hortencia Pilar  Pre-operative Diagnosis: Atherosclerotic occlusive disease bilateral lower extremities with left ulceration and osteomyelitis of the first lower extremity ray  Post-operative diagnosis: Same  Procedure(s) Performed: 1. Introduction catheter into left lower extremity 3rd order catheter placement with 2 additional third order catheter placements 2. Contrast injection left lower extremity for distal runoff with 2 additional 3rd order catheter placements  3. Percutaneous transluminal angioplasty left posterior tibial artery to 3 mm              4. Percutaneous transluminal angioplasty left peroneal artery to 2.5 mm  5. Star close closure right common femoral arteriotomy  Anesthesia: Conscious sedation was administered under my direct supervision by the interventional radiology RN. IV Versed plus fentanyl were utilized. Continuous ECG, pulse oximetry and blood pressure was monitored throughout the entire procedure.  Conscious sedation was for a total of 1 hour 17 minutes.  Sheath: 6 French Raby  Contrast: 60 cc  Fluoroscopy Time: 13.3 minutes  Indications: Shadi Sessler Kirchgessner presents with increasing pain of the left lower extremity.  In the office the patient was noted to have bone palpable within the ulcer.  He was subsequently admitted to the hospital where plain films confirmed osteomyelitis.  He is undergone debridement.  His noninvasive studies and physical exam suggest severe atherosclerotic occlusive disease.  This suggests the patient is having limb threatening ischemia. The risks and benefits are reviewed all questions answered patient agrees to proceed.  Procedure:Marquee W Angelos is a 67 y.o. y.o. male who was identified and appropriate procedural time out was performed. The  patient was then placed supine on the table and prepped and draped in the usual sterile fashion.   Ultrasound was placed in the sterile sleeve and the right groin was evaluated the right common femoral artery was echolucent and pulsatile indicating patency. Image was recorded for the permanent record and under real-time visualization a microneedle was inserted into the common femoral artery followed by the microwire and then the micro-sheath. A J-wire was then advanced through the micro-sheath and a 5 [place] sheath was then inserted over a J-wire. J-wire was then advanced and a 5 French pigtail catheter was positioned at the level of T12.  AP projection of the aorta was then obtained. Pigtail catheter was repositioned to above the bifurcation and a RAO view of the pelvis was obtained. Subsequently a pigtail catheter with the stiff angle Glidewire was used to cross the aortic bifurcation the catheter wire were advanced down into the left distal external iliac artery. Oblique view of the femoral bifurcation was then obtained and subsequently the wire was reintroduced and the pigtail catheter negotiated into the SFA representing third order catheter placement. Distal runoff was then performed.  5000 units of heparin was then given and allowed to circulate and a 6 [place] Raby sheath was advanced up and over the bifurcation and positioned in the femoral artery  Straight catheter and stiff angle Glidewire were then negotiated down into the distal popliteal. Catheter was then advanced. Hand injection contrast demonstrated the tibial anatomy in detail.  Using a vertebral catheter and the Glidewire the left posterior tibial artery was selected hand-injection of contrast demonstrated the proximal lesion of 90%.  A 3 mm x 40 mm Ultraverse balloon was used to angioplasty the posterior tibial.  The inflation was for 1 minutes at 12 atm. Follow-up imaging demonstrated excellent patency with less than  5% residual  stenosis and preservation of the distal runoff.  The detector was then repositioned and the distal popliteal was re-imaged.   90% stenosis is noted in the distal peroneal.  The catheter and wire were then negotiated into the peroneal were magnified imaging through the catheter was performed of the distal peroneal.  A 2.5 mm x 40 mm Ultraverse balloon was then advanced across the lesion and inflated to 12 atm for 1 minute. Follow-up imaging demonstrated patency with excellent result and less than 5% residual stenosis.  Catheter was then brought down to the mid popliteal and trifurcation was imaged again.  Catheter and wire were then used in select the anterior tibial.  Hand-injection contrast demonstrated occlusion proximally 3 cm from the origin.  Using a combination of the command wire as well as the vertebral catheter and an 018 CXI catheter the occlusion was probed.  It was not able to be crossed.  Given that we now had 2 out of 3 tibial vessels with direct and in-line flow to the foot I did not feel that further attempts at reestablishing anterior tibial flow were warranted at this time.  Distal runoff was then reassessed and noted to be widely patent.   After review of these images the sheath is pulled into the right external iliac oblique of the common femoral is obtained and a Star close device deployed. There no immediate Complications.  Findings: The abdominal aorta is opacified with a bolus injection contrast. Renal arteries are single and widely patent. The aorta itself has diffuse disease but no hemodynamically significant lesions. The common and external iliac arteries are widely patent bilaterally.  The right common femoral is widely patent as is the profunda femoris.  The SFA and popliteal demonstrate diffuse disease but there are no hemodynamically significant lesions.  The trifurcation is patent with occlusion of the anterior tibial approximately 3 cm distal to the origin there is  reconstitution more distally.  There appears to be diffuse disease within the dorsalis pedis.  The peroneal and posterior tibial arteries both demonstrate focal lesions that are greater than 90% it is a proximal lesion in the posterior tibial and a distal lesion in the peroneal.  Following angioplasty the posterior tibial now is now widely patent and demonstrates in-line flow with less than 5% residual stenosis.  It is much improved and looks quite nice. Angioplasty of the peroneal also yields an excellent result with less than 5% residual stenosis.  Summary: Successful recanalization left lower extremity for limb salvage now with 2 tibial vessel runoff.  Disposition: Patient was taken to the recovery room in stable condition having tolerated the procedure well.  Belenda Cruise Vanna Shavers 03/20/2017,12:26 PM

## 2017-03-21 ENCOUNTER — Encounter: Payer: Self-pay | Admitting: Vascular Surgery

## 2017-03-21 ENCOUNTER — Other Ambulatory Visit: Payer: Self-pay | Admitting: Cardiovascular Disease

## 2017-03-21 ENCOUNTER — Other Ambulatory Visit: Payer: Self-pay

## 2017-03-22 LAB — SURGICAL PATHOLOGY

## 2017-03-23 ENCOUNTER — Ambulatory Visit: Payer: Medicare HMO | Admitting: Cardiovascular Disease

## 2017-03-23 ENCOUNTER — Encounter: Payer: Self-pay | Admitting: Cardiovascular Disease

## 2017-03-23 VITALS — BP 142/74 | HR 81 | Ht 69.0 in | Wt 246.0 lb

## 2017-03-23 DIAGNOSIS — I739 Peripheral vascular disease, unspecified: Secondary | ICD-10-CM

## 2017-03-23 DIAGNOSIS — E1142 Type 2 diabetes mellitus with diabetic polyneuropathy: Secondary | ICD-10-CM | POA: Diagnosis not present

## 2017-03-23 DIAGNOSIS — I25810 Atherosclerosis of coronary artery bypass graft(s) without angina pectoris: Secondary | ICD-10-CM

## 2017-03-23 DIAGNOSIS — L97523 Non-pressure chronic ulcer of other part of left foot with necrosis of muscle: Secondary | ICD-10-CM

## 2017-03-23 MED ORDER — POTASSIUM CHLORIDE ER 10 MEQ PO TBCR: 10.0000 meq | EXTENDED_RELEASE_TABLET | Freq: Every day | ORAL | 3 refills | Status: DC

## 2017-03-23 MED ORDER — LOSARTAN POTASSIUM 25 MG PO TABS: 25.0000 mg | ORAL_TABLET | Freq: Every day | ORAL | 6 refills | Status: DC

## 2017-03-23 NOTE — Patient Instructions (Signed)

## 2017-03-23 NOTE — Progress Notes (Signed)
Cardiology Office Note  Date:  03/23/2017   ID:  Benjamin Davidson, DOB 1950/10/24, MRN [id number]  PCP:  Venia Carbon, MD   Chief Complaint  Patient presents with  . Other    OD 12 month follow up. Last seen 10/2015. Patient c/o SOB.  Meds reviewed verbally with patient.     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 left great toe Started out as a blister that never healed  Vascular surgery March 20, 2017 Angioplasty of left posterior tibial Angioplasty left peroneal artery  Take insulin 70/30 takes twice a day Sugars 120 to 190s  Depending on what he eats, sugars can sometimes run much higher   HCT 30  Left foot is still in the boot Wrapped in a bandage He has follow-up with surgery  Hemoglobin A1c  7.4 Sometimes eating the wrong foods  Long history of shortness of breath with exertion No exercise, still obese Takes lasix daily Denies having significant leg swelling Long discussion concerning his diabetic diet Diet provided  EKG on today's visit shows normal sinus rhythm with rate 81 bpm, left bundle branch block  Other past medical history reviewed History of osteomyelitis requiring wound VAC on his right foot, 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 He had gangrene of the fifth toe on the right with acute renal failure and dehydration admitted 08/07/2011  last stress test July 2008 showing mild ischemia in the mid anteroseptal, apical anterior and apical septal region. Ejection fraction 40%. This test was done post bypass. No cardiac catheterization was done in followup as  he had no symptoms.  PMH:   has a past medical history of Anemia, Asthma, CHF (congestive heart failure) ([place]), Coronary artery disease, DM (diabetes mellitus), type 2, uncontrolled, periph vascular complic (), Edema, Hypertension, Left leg DVT ([place]), MDD (recurrent major depressive disorder) in remission [facility]), Peripheral vascular disease ([employer]), Pure hypercholesterolemia, Shortness of breath dyspnea, and Syncope.  PSH:    Past Surgical History:  Procedure Laterality Date  . AMPUTATION TOE Left 03/17/2017   Procedure: FIRST RAY RESECTION LEFT FOOT;  Surgeon: Sharlotte Alamo, DPM;  Location: [facility] ORS;  Service: Podiatry;  Laterality: Left;  . CARDIAC CATHETERIZATION    . CATARACT EXTRACTION W/PHACO Left 06/03/2015   Procedure: CATARACT EXTRACTION PHACO AND INTRAOCULAR LENS PLACEMENT (IOC);  Surgeon: Eulogio Bear, MD;  Location: [facility] ORS;  Service: Ophthalmology;  Laterality: Left;  Lot# [id number] H [place]: 1.06 AP%: 5.5 CDE: 3.69  . CORONARY ARTERY BYPASS GRAFT    . EYE SURGERY    . PARS PLANA VITRECTOMY Right 02/17/2015   Procedure: PARS PLANA VITRECTOMY WITH 25 GAUGE, endolaser, right eye;  Surgeon: Milus Height, MD;  Location: [facility] ORS;  Service: Ophthalmology;  Laterality: Right;  fluid pack lot # [id number] H endolaser: 200w, 6203T  . PERCUTANEOUS STENT INTERVENTION     right leg  . PERIPHERAL VASCULAR BALLOON ANGIOPLASTY Left 03/20/2017   Procedure: PERIPHERAL VASCULAR BALLOON ANGIOPLASTY;  Surgeon: Katha Cabal, MD;  Location: [facility];  Service: Cardiovascular;  Laterality: Left;  . PERIPHERALLY INSERTED CENTRAL CATHETER INSERTION  2013  . TOE AMPUTATION  left 5th, right 4th and 5th    Current Outpatient Medications  Medication Sig Dispense Refill  . aspirin EC 81 MG tablet Take 1 tablet (81 mg total) by mouth daily. 150 tablet 2  . carvedilol (COREG) 6.25 MG tablet Take 6.25 mg by mouth 2 (two) times daily with a meal.    . clopidogrel (PLAVIX) 75 MG tablet Take  1 tablet (75 mg total) by mouth daily with breakfast. 30 tablet 4  . doxycycline (VIBRA-TABS) 100 MG tablet Take 1 tablet (100 mg total) by mouth every 12 (twelve) hours. 30 tablet 2  . furosemide (LASIX) 20 MG tablet TAKE 1 TABLET BY MOUTH ONCE A DAY ** MAYTAKE ONE ADDITIONAL TABLET IFINCREASED SWELLING (Patient taking differently: Take 20 mg by mouth daily. TAKE 1 TABLET BY MOUTH ONCE A DAY ** MAYTAKE ONE ADDITIONAL TABLET IFINCREASED SWELLING) 60 tablet 3  . gabapentin (NEURONTIN) 300 MG capsule Take 1 capsule (300 mg total) by mouth 2 (two) times daily. 60 capsule 11  . insulin NPH-regular Human (NOVOLIN 70/30) (70-30) 100 UNIT/ML injection Inject 35 Units into the skin 2 (two) times daily.    . insulin regular (NOVOLIN R,HUMULIN R) 100 units/mL injection Inject 5-6 Units into the skin 3 (three) times daily as needed for high blood sugar. Per sliding scale    . Insulin Syringe-Needle U-100 (PRODIGY INSULIN SYRINGE) 31G X 5/16" 0.5 ML MISC Inject 1 Syringe into the skin 2 (two) times daily. 100 each 7  . losartan (COZAAR) 25 MG tablet Take 1 tablet (25 mg total) by mouth daily. 30 tablet 6  . NOVOLIN R 100 UNIT/ML injection Inject 5-6 Units into the skin 3 (three) times daily as needed for high blood sugar. Per sliding scale    . rosuvastatin (CRESTOR) 20 MG tablet Take 1 tablet (20 mg total) by mouth daily. 90 tablet 3  . sertraline (ZOLOFT) 100 MG tablet Take 100 mg by mouth at bedtime.    Marland Kitchen warfarin (COUMADIN) 5 MG tablet Take 2.5 mg by mouth at bedtime.    . potassium chloride (K-DUR) 10 MEQ tablet Take 1 tablet (10 mEq total) by mouth daily. 90 tablet 3   No current facility-administered medications for this visit.      Allergies:   Lisinopril   Social History:  The patient  reports that  has never smoked. he has never used smokeless tobacco. He reports that he does not drink alcohol or use drugs.   Family History:   family history includes Heart disease in his sister; Hyperlipidemia in  his brother.    Review of Systems: Review of Systems  Constitutional: Negative.   Respiratory: Positive for shortness of breath.   Cardiovascular: Negative.   Gastrointestinal: Negative.   Musculoskeletal: Negative.   Neurological: Negative.   Psychiatric/Behavioral: Negative.   All other systems reviewed and are negative.    PHYSICAL EXAM: VS:  BP (!) 142/74 (BP Location: Right Arm, Patient Position: Sitting, Cuff Size: Normal)   Pulse 81   Ht 5\' 9"  (1.753 m)   Wt 246 lb (111.6 kg)   BMI 36.33 kg/m  , BMI Body mass index is 36.33 kg/m. Constitutional:  oriented to person, place, and time. No distress. Obese HENT:  Head: Normocephalic and atraumatic.  Eyes:  no discharge. No scleral icterus.  Neck: Normal range of motion. Neck supple. No JVD present.  Cardiovascular: Normal rate, regular rhythm, normal heart sounds and intact distal pulses. Exam reveals no gallop and no friction rub. No edema No  murmur heard. Pulmonary/Chest: Effort normal and breath sounds normal. No stridor. No respiratory distress.  no wheezes.  no rales.  no tenderness.  Abdominal: Soft.  no distension.  no tenderness.  Musculoskeletal: Normal range of motion.  no  tenderness or deformity.  Left foot in a bandage, also in a walking shoe Neurological:  normal muscle tone. Coordination normal. No atrophy Skin: Skin is warm and dry. No rash noted. not diaphoretic.  Psychiatric:  normal mood and affect. behavior is normal. Thought content normal.   Recent Labs: 02/06/2017: ALT 27 03/20/2017: BUN 17; Creatinine, Ser 1.02; Hemoglobin 9.9; Magnesium 1.9; Platelets 343; Potassium 3.7; Sodium 138    Lipid Panel Lab Results  Component Value Date   CHOL 112 02/06/2017   HDL 34.40 (L) 02/06/2017   LDLCALC 60 02/06/2017   TRIG 88.0 02/06/2017      Wt Readings from Last 3 Encounters:  03/23/17 246 lb (111.6 kg)  03/20/17 246 lb 4.1 oz (111.7 kg)  03/15/17 250 lb 6.4 oz (113.6 kg)       ASSESSMENT AND  PLAN:  Essential hypertension - Plan: EKG 12-Lead Blood pressure is well controlled on today's visit. No changes made to the medications. We will renew his medications  Atherosclerosis of coronary artery bypass graft of native heart without angina pectoris - Plan: EKG 12-Lead Currently with no symptoms of angina. No further workup at this time. Continue current medication regimen.  Stable  DM (diabetes mellitus), type 2, uncontrolled, periph vascular complic ([place]) Stressed the importance of aggressive diabetes control especially given his PAD  Pure hypercholesterolemia Cholesterol is at goal on the current lipid regimen. No changes to the medications were made.  Stable  DYSPNEA Shortness of breath likely multifactorial including deconditioning, obesity Currently sedentary, immobile given his foot Recommended once he starts moving again if he continues to have worsening symptoms we would order stress testing to rule out ischemia  Morbid obesity Recommended continued strict diet, Suggested he start exercise program   Total encounter time more than 25 minutes  Greater than 50% was spent in counseling and coordination of care with the patient  Disposition:   F/U  12 months   Orders Placed This Encounter  Procedures  . EKG 12-Lead     Signed, Esmond Plants, M.D., Ph.D. 03/23/2017  [place], [place]

## 2017-03-26 ENCOUNTER — Telehealth: Payer: Self-pay

## 2017-03-26 NOTE — Telephone Encounter (Signed)
Flagged on EMMI report for having unfilled prescriptions, not being able to fill them today/tomorrow, and having other questions or problems.  Attempted to call 03/26/17 at 9:45am, though no answer.  Will attempt at a later time.

## 2017-03-27 DIAGNOSIS — M79672 Pain in left foot: Secondary | ICD-10-CM | POA: Diagnosis not present

## 2017-03-27 NOTE — Telephone Encounter (Signed)
Able to reach patient upon 2nd attempt.  Patient states his PCP was working to call in medications needed.  States he has no further concerns.

## 2017-03-28 ENCOUNTER — Ambulatory Visit: Payer: Medicare HMO | Admitting: Internal Medicine

## 2017-03-28 NOTE — Discharge Summary (Signed)
[facility]    Discharge Summary    Patient ID:  Benjamin Davidson MRN: [id number] DOB/AGE: 1950/03/15 67 y.o.  Admit date: 03/15/2017 Discharge date: 03/28/2017 Date of Surgery: 03/20/2017 Surgeon: Surgeon(s): Anea Fodera, Dolores Lory, MD  Admission Diagnosis: osteomyelitis left foot  Discharge Diagnoses:  osteomyelitis left foot  Secondary Diagnoses: Past Medical History:  Diagnosis Date  . Anemia   . Asthma    childhood  . CHF (congestive heart failure) ([place])   . Coronary artery disease   . DM (diabetes mellitus), type 2, uncontrolled, periph vascular complic ([place])    Type II  . Edema    feet/legs  . Hypertension   . Left leg DVT ([place])   . MDD (recurrent major depressive disorder) in remission ([place])   . Peripheral vascular disease (HCC)    Left toe amputations. Dr Lucky Cowboy  . Pure hypercholesterolemia   . Shortness of breath dyspnea   . Syncope    resolved    Procedure(s): PERIPHERAL VASCULAR BALLOON ANGIOPLASTY  Discharged Condition: good  HPI:  Patient was initially seen in the office and noted to have clinically positive osteomyelitis of the left first ray.  Based on my findings in the office I requested that he be admitted to the hospital where appropriate intravenous antibiotics could be given and surgical debridement could be carried out.  Patient reluctantly agreed with this and was admitted to [facility].  Podiatry and medical consultations were obtained.  Medicine assisted with controlling his hypertension cardiac comorbidities as well as his diabetes.  Dr. Cleda Mccreedy performed amputation left great toe with partial first ray resection on 03/17/2017.  Subsequently I performed angiography with intervention he underwent angioplasty of the left posterior tibial artery as well as the left peroneal artery.  On March 20, 2017 the patient was felt fit for discharge.  He is discharged home.  He will follow-up with me in the office she will  follow-up with Dr. Cleda Mccreedy will follow up with Dr. Silvio Pate as well as Dr. Rolly Pancake Course:  Benjamin Davidson is a 67 y.o. male is S/P Left Procedure(s): PERIPHERAL VASCULAR BALLOON ANGIOPLASTY Extubated: POD # 0 Physical exam: Ulceration left great toe Post-op wounds erythematous or healing well Pt. Ambulating, voiding and taking PO diet without difficulty. Pt pain controlled with PO pain meds. Labs as below Complications:none  Consults:  Treatment Team:  Sharlotte Alamo, DPM Dustin Flock, MD  Significant Diagnostic Studies: CBC Lab Results  Component Value Date   WBC 9.3 03/20/2017   HGB 9.9 (L) 03/20/2017   HCT 30.4 (L) 03/20/2017   MCV 80.5 03/20/2017   PLT 343 03/20/2017    BMET    Component Value Date/Time   NA 138 03/20/2017 0348   NA 141 08/13/2011 0626   K 3.7 03/20/2017 0348   K 3.9 08/13/2011 0626   CL 105 03/20/2017 0348   CL 108 (H) 08/13/2011 0626   CO2 25 03/20/2017 0348   CO2 23 08/13/2011 0626   GLUCOSE 144 (H) 03/20/2017 0348   GLUCOSE 135 (H) 08/13/2011 0626   BUN 17 03/20/2017 0348   BUN 10 08/13/2011 0626   CREATININE 1.02 03/20/2017 0348   CREATININE 1.10 08/13/2011 0626   CALCIUM 8.3 (L) 03/20/2017 0348   CALCIUM 8.0 (L) 08/13/2011 0626   GFRNONAA >60 03/20/2017 0348   GFRNONAA >60 08/13/2011 0626   GFRAA >60 03/20/2017 0348   GFRAA >60 08/13/2011 0626   COAG Lab Results  Component Value Date  INR 1.23 03/20/2017   INR 1.28 03/19/2017   INR 1.37 03/18/2017     Disposition:  Discharge to :Home Discharge Instructions    Call MD for:  redness, tenderness, or signs of infection (pain, swelling, bleeding, redness, odor or green/yellow discharge around incision site)   Complete by:  As directed    Call MD for:  severe or increased pain, loss or decreased feeling  in affected limb(s)   Complete by:  As directed    Call MD for:  temperature >100.5   Complete by:  As directed    Resume previous diet   Complete by:  As directed       Allergies as of 03/20/2017      Reactions   Lisinopril    Cough Flu like symptoms      Medication List    TAKE these medications   aspirin EC 81 MG tablet Take 1 tablet (81 mg total) by mouth daily. What changed:    medication strength  how much to take  when to take this   carvedilol 6.25 MG tablet Commonly known as:  COREG Take 6.25 mg by mouth 2 (two) times daily with a meal.   clopidogrel 75 MG tablet Commonly known as:  PLAVIX Take 1 tablet (75 mg total) by mouth daily with breakfast.   doxycycline 100 MG tablet Commonly known as:  VIBRA-TABS Take 1 tablet (100 mg total) by mouth every 12 (twelve) hours.   furosemide 20 MG tablet Commonly known as:  LASIX TAKE 1 TABLET BY MOUTH ONCE A DAY ** MAYTAKE ONE ADDITIONAL TABLET IFINCREASED SWELLING What changed:    how much to take  how to take this  when to take this  additional instructions   gabapentin 300 MG capsule Commonly known as:  NEURONTIN Take 1 capsule (300 mg total) by mouth 2 (two) times daily.   insulin NPH-regular Human (70-30) 100 UNIT/ML injection Commonly known as:  NOVOLIN 70/30 Inject 35 Units into the skin 2 (two) times daily.   Insulin Syringe-Needle U-100 31G X 5/16" 0.5 ML Misc Commonly known as:  PRODIGY INSULIN SYRINGE Inject 1 Syringe into the skin 2 (two) times daily.   insulin regular 100 units/mL injection Commonly known as:  NOVOLIN R,HUMULIN R Inject 5-6 Units into the skin 3 (three) times daily as needed for high blood sugar. Per sliding scale   NOVOLIN R 100 units/mL injection Generic drug:  insulin regular Inject 5-6 Units into the skin 3 (three) times daily as needed for high blood sugar. Per sliding scale   rosuvastatin 20 MG tablet Commonly known as:  CRESTOR Take 1 tablet (20 mg total) by mouth daily.   sertraline 100 MG tablet Commonly known as:  ZOLOFT Take 100 mg by mouth at bedtime.   warfarin 5 MG tablet Commonly known as:  COUMADIN Take as  directed. If you are unsure how to take this medication, talk to your nurse or doctor. Original instructions:  Take 2.5 mg by mouth at bedtime.      Verbal and written Discharge instructions given to the patient. Wound care per Discharge AVS Follow-up Information    Sharlotte Alamo, DPM. Go on 03/27/2017.   Specialty:  Podiatry Why:  at [employer] information: [place] [address] [phone]        Mieke Brinley, Dolores Lory, MD Follow up in 2 week(s).   Specialties:  Vascular Surgery, Cardiology, Radiology, Vascular Surgery Why:  with an ABI Contact information: [address]  [place] [address] [phone]           Signed: Hortencia Pilar, MD  03/28/2017, 2:10 PM

## 2017-03-29 DIAGNOSIS — E11621 Type 2 diabetes mellitus with foot ulcer: Secondary | ICD-10-CM | POA: Diagnosis not present

## 2017-03-29 DIAGNOSIS — Z89412 Acquired absence of left great toe: Secondary | ICD-10-CM | POA: Diagnosis not present

## 2017-03-29 DIAGNOSIS — M86172 Other acute osteomyelitis, left ankle and foot: Secondary | ICD-10-CM | POA: Diagnosis not present

## 2017-03-29 DIAGNOSIS — Z7901 Long term (current) use of anticoagulants: Secondary | ICD-10-CM | POA: Diagnosis not present

## 2017-03-29 DIAGNOSIS — Z4781 Encounter for orthopedic aftercare following surgical amputation: Secondary | ICD-10-CM | POA: Diagnosis not present

## 2017-03-29 DIAGNOSIS — Z794 Long term (current) use of insulin: Secondary | ICD-10-CM | POA: Diagnosis not present

## 2017-04-03 DIAGNOSIS — E113593 Type 2 diabetes mellitus with proliferative diabetic retinopathy without macular edema, bilateral: Secondary | ICD-10-CM | POA: Diagnosis not present

## 2017-04-05 ENCOUNTER — Other Ambulatory Visit: Payer: Self-pay | Admitting: Cardiovascular Disease

## 2017-04-16 ENCOUNTER — Ambulatory Visit (INDEPENDENT_AMBULATORY_CARE_PROVIDER_SITE_OTHER): Payer: Medicare HMO | Admitting: Vascular Surgery

## 2017-04-16 ENCOUNTER — Encounter (INDEPENDENT_AMBULATORY_CARE_PROVIDER_SITE_OTHER): Payer: Medicare HMO

## 2017-04-19 DIAGNOSIS — M86172 Other acute osteomyelitis, left ankle and foot: Secondary | ICD-10-CM | POA: Diagnosis not present

## 2017-04-25 ENCOUNTER — Telehealth (INDEPENDENT_AMBULATORY_CARE_PROVIDER_SITE_OTHER): Payer: Self-pay

## 2017-04-25 NOTE — Telephone Encounter (Signed)
Patient called wanting to increase his antibiotic from 30 to 60 tablets. Patient takes Doxycycline 100mg  every twelve hours. Patient was given 30 tablets with 2 refills. Patient has not picked up a refill from the pharmacy and the patient has an appt. On 04/30/17 with Dr. Delana Meyer.

## 2017-04-30 ENCOUNTER — Ambulatory Visit (INDEPENDENT_AMBULATORY_CARE_PROVIDER_SITE_OTHER): Payer: Medicare HMO

## 2017-04-30 ENCOUNTER — Encounter (INDEPENDENT_AMBULATORY_CARE_PROVIDER_SITE_OTHER): Payer: Self-pay | Admitting: Vascular Surgery

## 2017-04-30 ENCOUNTER — Ambulatory Visit (INDEPENDENT_AMBULATORY_CARE_PROVIDER_SITE_OTHER): Payer: Medicare HMO | Admitting: Vascular Surgery

## 2017-04-30 ENCOUNTER — Other Ambulatory Visit (INDEPENDENT_AMBULATORY_CARE_PROVIDER_SITE_OTHER): Payer: Self-pay | Admitting: Vascular Surgery

## 2017-04-30 VITALS — BP 127/73 | HR 76 | Resp 17 | Ht 70.0 in | Wt 249.0 lb

## 2017-04-30 DIAGNOSIS — I70249 Atherosclerosis of native arteries of left leg with ulceration of unspecified site: Secondary | ICD-10-CM

## 2017-04-30 DIAGNOSIS — I10 Essential (primary) hypertension: Secondary | ICD-10-CM

## 2017-04-30 DIAGNOSIS — I25810 Atherosclerosis of coronary artery bypass graft(s) without angina pectoris: Secondary | ICD-10-CM | POA: Diagnosis not present

## 2017-04-30 DIAGNOSIS — E1142 Type 2 diabetes mellitus with diabetic polyneuropathy: Secondary | ICD-10-CM | POA: Diagnosis not present

## 2017-04-30 DIAGNOSIS — I739 Peripheral vascular disease, unspecified: Secondary | ICD-10-CM | POA: Diagnosis not present

## 2017-04-30 DIAGNOSIS — Z9862 Peripheral vascular angioplasty status: Secondary | ICD-10-CM | POA: Diagnosis not present

## 2017-04-30 DIAGNOSIS — E7800 Pure hypercholesterolemia, unspecified: Secondary | ICD-10-CM

## 2017-05-03 ENCOUNTER — Encounter (INDEPENDENT_AMBULATORY_CARE_PROVIDER_SITE_OTHER): Payer: Self-pay | Admitting: Vascular Surgery

## 2017-05-03 NOTE — Progress Notes (Signed)
MRN : [id number]  Benjamin Davidson is a 67 y.o. (1950/09/18) male who presents with chief complaint of  Chief Complaint  Patient presents with  . Follow-up    2 week f/u ABI  .  History of Present Illness:  The patient returns to the office for followup and review status post angiogram with intervention.   Procedure(s) Performed 03/20/2017: 1. Introduction catheter into left lower extremity 3rd order catheter placement with 2 additional third order catheter placements 2. Contrast injection left lower extremity for distal runoff with 2 additional 3rd order catheter placements  3. Percutaneous transluminal angioplasty left posterior tibial artery to 3 mm              4. Percutaneous transluminal angioplasty left peroneal artery to 2.5 mm  5. Star close closure right common femoral arteriotomy  The patient notes improvement in the lower extremity symptoms. No interval shortening of the patient's claudication distance or rest pain symptoms. Previous wounds have now healed.  No new ulcers or wounds have occurred since the last visit.  There have been no significant changes to the patient's overall health care.  The patient denies amaurosis fugax or recent TIA symptoms. There are no recent neurological changes noted. The patient denies history of DVT, PE or superficial thrombophlebitis. The patient denies recent episodes of angina or shortness of breath.   TBI's Rt=0.65 and Lt=0.40  Triphasic signals   No outpatient medications have been marked as taking for the 04/30/17 encounter (Office Visit) with Delana Meyer, Dolores Lory, MD.    Past Medical History:  Diagnosis Date  . Anemia   . Asthma    childhood  . CHF (congestive heart failure) ([place])   . Coronary artery disease   . DM (diabetes mellitus), type 2, uncontrolled, periph vascular complic ([place])    Type II  . Edema    feet/legs  . Hypertension   . Left leg DVT ([place])     . MDD (recurrent major depressive disorder) in remission ([place])   . Peripheral vascular disease (HCC)    Left toe amputations. Dr Lucky Cowboy  . Pure hypercholesterolemia   . Shortness of breath dyspnea   . Syncope    resolved    Past Surgical History:  Procedure Laterality Date  . AMPUTATION TOE Left 03/17/2017   Procedure: FIRST RAY RESECTION LEFT FOOT;  Surgeon: Sharlotte Alamo, DPM;  Location: [facility];  Service: Podiatry;  Laterality: Left;  . CARDIAC CATHETERIZATION    . CATARACT EXTRACTION W/PHACO Left 06/03/2015   Procedure: CATARACT EXTRACTION PHACO AND INTRAOCULAR LENS PLACEMENT (IOC);  Surgeon: Eulogio Bear, MD;  Location: [facility] OR[facility];  Service: Ophthalmology;  Laterality: Left;  Lot# [id number] H [place]: 1.06 AP%: 5.5 CDE: 3.69  . CORONARY ARTERY BYPASS GRAFT    . EYE SURGERY    . PARS PLANA VITRECTOMY Right 02/17/2015   Procedure: PARS PLANA VITRECTOMY WITH 25 GAUGE, endolaser, right eye;  Surgeon: Milus Height, MD;  Location: [facility] ORS;  Service: Ophthalmology;  Laterality: Right;  fluid pack lot # [id number] H endolaser: 200w, 2595G  . PERCUTANEOUS STENT INTERVENTION     right leg  . PERIPHERAL VASCULAR BALLOON ANGIOPLASTY Left 03/20/2017   Procedure: PERIPHERAL VASCULAR BALLOON ANGIOPLASTY;  Surgeon: Katha Cabal, MD;  Location: [facility];  Service: Cardiovascular;  Laterality: Left;  . PERIPHERALLY INSERTED CENTRAL CATHETER INSERTION  2013  . TOE AMPUTATION     left 5th, right 4th and 5th    Social History Social History  Tobacco Use  . Smoking status: Never Smoker  . Smokeless tobacco: Never Used  Substance Use Topics  . Alcohol use: No    Alcohol/week: 0.0 oz  . Drug use: No    Family History Family History  Problem Relation Age of Onset  . Heart disease Sister   . Hyperlipidemia Brother     Allergies  Allergen Reactions  . Lisinopril     Cough Flu like symptoms      REVIEW OF SYSTEMS (Negative unless checked)  Constitutional: [] Weight loss   [] Fever  [] Chills Cardiac: [] Chest pain   [] Chest pressure   [] Palpitations   [] Shortness of breath when laying flat   [] Shortness of breath with exertion. Vascular:  [x] Pain in legs with walking   [] Pain in legs at rest  [] History of DVT   [] Phlebitis   [] Swelling in legs   [] Varicose veins   [x] Non-healing ulcers Pulmonary:   [] Uses home oxygen   [] Productive cough   [] Hemoptysis   [] Wheeze  [] COPD   [] Asthma Neurologic:  [] Dizziness   [] Seizures   [] History of stroke   [] History of TIA  [] Aphasia   [] Vissual changes   [] Weakness or numbness in arm   [] Weakness or numbness in leg Musculoskeletal:   [] Joint swelling   [] Joint pain   [] Low back pain Hematologic:  [] Easy bruising  [] Easy bleeding   [] Hypercoagulable state   [] Anemic Gastrointestinal:  [] Diarrhea   [] Vomiting  [] Gastroesophageal reflux/heartburn   [] Difficulty swallowing. Genitourinary:  [] Chronic kidney disease   [] Difficult urination  [] Frequent urination   [] Blood in urine Skin:  [] Rashes   [x] Ulcers  Psychological:  [] History of anxiety   []  History of major depression.  Physical Examination  Vitals:   04/30/17 1557  BP: 127/73  Pulse: 76  Resp: 17  Weight: 249 lb (112.9 kg)  Height: 5\' 10"  (1.778 m)   Body mass index is 35.73 kg/m. Gen: WD/WN, NAD Head: [place]/AT, No temporalis wasting.  Ear/Nose/Throat: Hearing grossly intact, nares w/o erythema or drainage Eyes: PER, EOMI, sclera nonicteric.  Neck: Supple, no large masses.   Pulmonary:  Good air movement, no audible wheezing bilaterally, no use of accessory muscles.  Cardiac: RRR, no JVD Vascular:  Vessel Right Left  Radial Palpable Palpable  PT Not Palpable Not Palpable  DP Not Palpable Not Palpable  Gastrointestinal: Non-distended. No guarding/no peritoneal signs.  Musculoskeletal: M/S 5/5 throughout.  No deformity or atrophy.  Neurologic: CN 2-12 intact. Symmetrical.  Speech is fluent. Motor exam as listed above. Psychiatric: Judgment intact, Mood & affect  appropriate for pt's clinical situation. Dermatologic: No rashes or ulcers noted.  No changes consistent with cellulitis. Lymph : No lichenification or skin changes of chronic lymphedema.  CBC Lab Results  Component Value Date   WBC 9.3 03/20/2017   HGB 9.9 (L) 03/20/2017   HCT 30.4 (L) 03/20/2017   MCV 80.5 03/20/2017   PLT 343 03/20/2017    BMET    Component Value Date/Time   NA 138 03/20/2017 0348   NA 141 08/13/2011 0626   K 3.7 03/20/2017 0348   K 3.9 08/13/2011 0626   CL 105 03/20/2017 0348   CL 108 (H) 08/13/2011 0626   CO2 25 03/20/2017 0348   CO2 23 08/13/2011 0626   GLUCOSE 144 (H) 03/20/2017 0348   GLUCOSE 135 (H) 08/13/2011 0626   BUN 17 03/20/2017 0348   BUN 10 08/13/2011 0626   CREATININE 1.02 03/20/2017 0348   CREATININE 1.10 08/13/2011 0626   CALCIUM 8.3 (  L) 03/20/2017 0348   CALCIUM 8.0 (L) 08/13/2011 0626   GFRNONAA >60 03/20/2017 0348   GFRNONAA >60 08/13/2011 0626   GFRAA >60 03/20/2017 0348   GFRAA >60 08/13/2011 0626   CrCl cannot be calculated (Patient's most recent lab result is older than the maximum 21 days allowed.).  COAG Lab Results  Component Value Date   INR 1.23 03/20/2017   INR 1.28 03/19/2017   INR 1.37 03/18/2017    Radiology No results found.  Assessment/Plan 1. PAD (peripheral artery disease) (HCC) Recommend:  The patient is status post successful angiogram with intervention.  The patient reports that the claudication symptoms and leg pain is essentially gone.   The patient denies lifestyle limiting changes at this point in time.  No further invasive studies, angiography or surgery at this time The patient should continue walking and begin a more formal exercise program.  The patient should continue antiplatelet therapy and aggressive treatment of the lipid abnormalities  Smoking cessation was again discussed  The patient should continue wearing graduated compression socks 10-15 mmHg strength to control the mild  edema.  Patient should undergo noninvasive studies as ordered. The patient will follow up with me after the studies.   - VAS [place] ABI WITH/WO TBI; Future - VAS [place] LOWER EXTREMITY ARTERIAL DUPLEX; Future  2. Essential hypertension Continue antihypertensive medications as already ordered, these medications have been reviewed and there are no changes at this time.   3. Atherosclerosis of coronary artery bypass graft of native heart without angina pectoris Continue cardiac and antihypertensive medications as already ordered and reviewed, no changes at this time.  Continue statin as ordered and reviewed, no changes at this time  Nitrates PRN for chest pain   4. Type 2 diabetes, controlled, with peripheral neuropathy ([place]) Continue hypoglycemic medications as already ordered, these medications have been reviewed and there are no changes at this time.  Hgb A1C to be monitored as already arranged by primary service   5. Pure hypercholesterolemia Continue statin as ordered and reviewed, no changes at this time    Hortencia Pilar, MD  05/03/2017 6:39 AM

## 2017-05-11 ENCOUNTER — Telehealth: Payer: Self-pay | Admitting: Cardiovascular Disease

## 2017-05-11 ENCOUNTER — Other Ambulatory Visit: Payer: Self-pay | Admitting: Cardiovascular Disease

## 2017-05-11 NOTE — Telephone Encounter (Signed)
Minna Merritts, MD Minna Merritts, MD    Disp Refills Start End   carvedilol (COREG) 6.25 MG tablet 180 tablet 2 05/11/2017    Sig: TAKE 1 TABLET BY MOUTH TWICE A DAY   Sent to pharmacy as: carvedilol (COREG) 6.25 MG tablet   E-Prescribing Status: Receipt confirmed by pharmacy (05/11/2017 1:20 PM EDT)

## 2017-05-11 NOTE — Telephone Encounter (Signed)
°*  STAT* If patient is at the pharmacy, call can be transferred to refill team.   1. Which medications need to be refilled? (please list name of each medication and dose if known) carvedilol (COREG) 6.25 two times daily with meal  2. Which pharmacy/location (including street and city if local pharmacy) is medication to be sent to? [place]   3. Do they need a 30 day or 90 day supply? 90 day

## 2017-05-15 DIAGNOSIS — E113593 Type 2 diabetes mellitus with proliferative diabetic retinopathy without macular edema, bilateral: Secondary | ICD-10-CM | POA: Diagnosis not present

## 2017-05-16 ENCOUNTER — Telehealth (INDEPENDENT_AMBULATORY_CARE_PROVIDER_SITE_OTHER): Payer: Self-pay

## 2017-05-16 NOTE — Telephone Encounter (Signed)
Patient called and stated that he was put on Plavix by Dr. Delana Meyer , but he states that he has also been taking his warfarin.  He wants to know if he should be taking both?  [phone]

## 2017-05-16 NOTE — Telephone Encounter (Signed)
We need to double check with Dr. Delana Meyer to make sure he knew that the patient was on Coumadin when he added the Plavix.

## 2017-05-17 NOTE — Telephone Encounter (Signed)
Called the patient back to let him know that Dr. Delana Meyer checked his last INR, and he wants the patient to continue to take the Aspirin along with the Coumadin and the Plavix. Should his levels be out of wack at his next INR check, Dr. Arneta Cliche will make adjustments at that time.

## 2017-05-18 DIAGNOSIS — E1142 Type 2 diabetes mellitus with diabetic polyneuropathy: Secondary | ICD-10-CM | POA: Diagnosis not present

## 2017-06-06 NOTE — Progress Notes (Signed)
Cardiology Office Note  Date:  06/07/2017   ID:  Benjamin Davidson, DOB September 21, 1950, MRN [id number]  PCP:  Venia Carbon, MD   Chief Complaint  Patient presents with  . Other    Patient c/o SOB and swelling in left leg. Meds reviewed verbally with patient.     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 left great toe Started out as a blister that never healed  Vascular surgery March 20, 2017 Angioplasty of left posterior tibial Angioplasty left peroneal artery  Take insulin 70/30 takes twice a day Sugars 120 to 190s  Depending on what he eats, sugars can sometimes run much higher   HCT 30  Left foot is still in the boot Wrapped in a bandage He has follow-up with surgery  Hemoglobin A1c  7.4 Sometimes eating the wrong foods  Long history of shortness of breath with exertion No exercise, still obese Takes lasix daily Denies having significant leg swelling Long discussion concerning his diabetic diet Diet provided  EKG on today's visit shows normal sinus rhythm with rate 81 bpm, left bundle branch block  Other past medical history reviewed History of osteomyelitis requiring wound VAC on his right foot, 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 He had gangrene of the fifth toe on the right with acute renal failure and dehydration admitted 08/07/2011  last stress test July 2008 showing mild ischemia in the mid anteroseptal, apical anterior and apical septal region. Ejection fraction 40%. This test was done post bypass. No cardiac catheterization was done in followup as he had no  symptoms.  PMH:   has a past medical history of Anemia, Asthma, CHF (congestive heart failure) ([place]), Coronary artery disease, DM (diabetes mellitus), type 2, uncontrolled, periph vascular complic ([place]), Edema, Hypertension, Left leg DVT ([place]), MDD (recurrent major depressive disorder) in remission [facility]), Peripheral vascular disease ([place]), Pure hypercholesterolemia, Shortness of breath dyspnea, and Syncope.  PSH:    Past Surgical History:  Procedure Laterality Date  . AMPUTATION TOE Left 03/17/2017   Procedure: FIRST RAY RESECTION LEFT FOOT;  Surgeon: Sharlotte Alamo, DPM;  Location: [facility] OR[facility];  Service: Podiatry;  Laterality: Left;  . CARDIAC CATHETERIZATION    . CATARACT EXTRACTION W/PHACO Left 06/03/2015   Procedure: CATARACT EXTRACTION PHACO AND INTRAOCULAR LENS PLACEMENT (IOC);  Surgeon: Eulogio Bear, MD;  Location: [facility] OR[facility];  Service: Ophthalmology;  Laterality: Left;  Lot# [id number] H [place]: 1.06 AP%: 5.5 CDE: 3.69  . CORONARY ARTERY BYPASS GRAFT    . EYE SURGERY    . PARS PLANA VITRECTOMY Right 02/17/2015   Procedure: PARS PLANA VITRECTOMY WITH 25 GAUGE, endolaser, right eye;  Surgeon: Milus Height, MD;  Location: [facility] OR[facility];  Service: Ophthalmology;  Laterality: Right;  fluid pack lot # [id number] H endolaser: 200w, 0347Q  . PERCUTANEOUS STENT INTERVENTION     right leg  . PERIPHERAL VASCULAR BALLOON ANGIOPLASTY Left 03/20/2017   Procedure: PERIPHERAL VASCULAR BALLOON ANGIOPLASTY;  Surgeon: Katha Cabal, MD;  Location: [place] [facility];  Service: Cardiovascular;  Laterality: Left;  . PERIPHERALLY INSERTED CENTRAL CATHETER INSERTION  2013  . TOE AMPUTATION     left 5th,  right 4th and 5th    Current Outpatient Medications  Medication Sig Dispense Refill  . aspirin EC 81 MG tablet Take 1 tablet (81 mg total) by mouth daily. 150 tablet 2  . carvedilol (COREG) 6.25 MG tablet TAKE 1 TABLET BY MOUTH TWICE A DAY 180 tablet 2  . clopidogrel (PLAVIX) 75 MG tablet Take 1 tablet (75 mg  total) by mouth daily with breakfast. 30 tablet 4  . doxycycline (VIBRA-TABS) 100 MG tablet Take 1 tablet (100 mg total) by mouth every 12 (twelve) hours. 30 tablet 2  . furosemide (LASIX) 20 MG tablet TAKE 1 TABLET BY MOUTH ONCE DAILY MAY TAKE ONE ADDITIONAL TAB IF INCREASEDSWELLING 60 tablet 3  . gabapentin (NEURONTIN) 300 MG capsule Take 1 capsule (300 mg total) by mouth 2 (two) times daily. 60 capsule 11  . insulin NPH-regular Human (NOVOLIN 70/30) (70-30) 100 UNIT/ML injection Inject 35 Units into the skin 2 (two) times daily.    . insulin regular (NOVOLIN R,HUMULIN R) 100 units/mL injection Inject 5-6 Units into the skin 3 (three) times daily as needed for high blood sugar. Per sliding scale    . Insulin Syringe-Needle U-100 (PRODIGY INSULIN SYRINGE) 31G X 5/16" 0.5 ML MISC Inject 1 Syringe into the skin 2 (two) times daily. 100 each 7  . losartan (COZAAR) 25 MG tablet Take 1 tablet (25 mg total) by mouth daily. 30 tablet 6  . NOVOLIN R 100 UNIT/ML injection Inject 5-6 Units into the skin 3 (three) times daily as needed for high blood sugar. Per sliding scale    . potassium chloride (K-DUR) 10 MEQ tablet Take 1 tablet (10 mEq total) by mouth daily. 90 tablet 3  . rosuvastatin (CRESTOR) 20 MG tablet Take 1 tablet (20 mg total) by mouth daily. 90 tablet 3  . sertraline (ZOLOFT) 100 MG tablet Take 100 mg by mouth at bedtime.    Marland Kitchen warfarin (COUMADIN) 5 MG tablet Take 2.5 mg by mouth at bedtime.     No current facility-administered medications for this visit.      Allergies:   Lisinopril   Social History:  The patient  reports that he has never smoked. He has never used smokeless tobacco. He reports that he does not drink alcohol or use drugs.   Family History:   family history includes Heart disease in his sister; Hyperlipidemia in his brother.    Review of Systems: Review of Systems  Constitutional: Negative.   Respiratory: Positive for shortness of breath.   Cardiovascular: Negative.    Gastrointestinal: Negative.   Musculoskeletal: Negative.   Neurological: Negative.   Psychiatric/Behavioral: Negative.   All other systems reviewed and are negative.    PHYSICAL EXAM: VS:  BP (!) 154/76 (BP Location: Left Arm, Patient Position: Sitting, Cuff Size: Large)   Ht 5\' 10"  (1.778 m)   Wt 254 lb 4 oz (115.3 kg)   BMI 36.48 kg/m  , BMI Body mass index is 36.48 kg/m. Constitutional:  oriented to person, place, and time. No distress. Obese HENT:  Head: Normocephalic and atraumatic.  Eyes:  no discharge. No scleral icterus.  Neck: Normal range of motion. Neck supple. No JVD present.  Cardiovascular: Normal rate, regular rhythm, normal heart sounds and intact distal pulses. Exam reveals no gallop and no friction rub. No edema No murmur heard. Pulmonary/Chest: Effort normal and breath sounds normal. No stridor. No respiratory distress.  no wheezes.  no rales.  no tenderness.  Abdominal: Soft.  no distension.  no tenderness.  Musculoskeletal: Normal range of motion.  no  tenderness or deformity.  Left foot in a bandage, also in a walking shoe Neurological:  normal muscle tone. Coordination normal. No atrophy Skin: Skin is warm and dry. No rash noted. not diaphoretic.  Psychiatric:  normal mood and affect. behavior is normal. Thought content normal.   Recent Labs: 02/06/2017: ALT 27 03/20/2017: BUN 17; Creatinine, Ser 1.02; Hemoglobin 9.9; Magnesium 1.9; Platelets 343; Potassium 3.7; Sodium 138    Lipid Panel Lab Results  Component Value Date   CHOL 112 02/06/2017   HDL 34.40 (L) 02/06/2017   LDLCALC 60 02/06/2017   TRIG 88.0 02/06/2017      Wt Readings from Last 3 Encounters:  06/07/17 254 lb 4 oz (115.3 kg)  04/30/17 249 lb (112.9 kg)  03/23/17 246 lb (111.6 kg)       ASSESSMENT AND PLAN:  Essential hypertension - Plan: EKG 12-Lead Blood pressure is well controlled on today's visit. No changes made to the medications. We will renew his  medications  Atherosclerosis of coronary artery bypass graft of native heart without angina pectoris - Plan: EKG 12-Lead Currently with no symptoms of angina. No further workup at this time. Continue current medication regimen.  Stable  DM (diabetes mellitus), type 2, uncontrolled, periph vascular complic ([place]) Stressed the importance of aggressive diabetes control especially given his PAD  Pure hypercholesterolemia Cholesterol is at goal on the current lipid regimen. No changes to the medications were made.  Stable  DYSPNEA Shortness of breath likely multifactorial including deconditioning, obesity Currently sedentary, immobile given his foot Recommended once he starts moving again if he continues to have worsening symptoms we would order stress testing to rule out ischemia  Morbid obesity Recommended continued strict diet, Suggested he start exercise program   Total encounter time more than 25 minutes  Greater than 50% was spent in counseling and coordination of care with the patient  Disposition:   F/U  12 months   No orders of the defined types were placed in this encounter.    Signed, Esmond Plants, M.D., Ph.D. 06/07/2017  [facility] [place], [place] [phone]   This encounter was created in error - please disregard.

## 2017-06-07 ENCOUNTER — Encounter: Payer: Self-pay | Admitting: Cardiovascular Disease

## 2017-06-07 ENCOUNTER — Encounter: Payer: Medicare HMO | Admitting: Cardiovascular Disease

## 2017-06-07 VITALS — BP 154/76 | Ht 70.0 in | Wt 254.2 lb

## 2017-06-11 DIAGNOSIS — B351 Tinea unguium: Secondary | ICD-10-CM | POA: Diagnosis not present

## 2017-06-11 DIAGNOSIS — I739 Peripheral vascular disease, unspecified: Secondary | ICD-10-CM | POA: Diagnosis not present

## 2017-06-23 ENCOUNTER — Other Ambulatory Visit: Payer: Self-pay | Admitting: Internal Medicine

## 2017-06-27 DIAGNOSIS — E113593 Type 2 diabetes mellitus with proliferative diabetic retinopathy without macular edema, bilateral: Secondary | ICD-10-CM | POA: Diagnosis not present

## 2017-07-02 ENCOUNTER — Encounter (INDEPENDENT_AMBULATORY_CARE_PROVIDER_SITE_OTHER): Payer: Self-pay | Admitting: Vascular Surgery

## 2017-07-02 ENCOUNTER — Ambulatory Visit (INDEPENDENT_AMBULATORY_CARE_PROVIDER_SITE_OTHER): Payer: Medicare HMO

## 2017-07-02 ENCOUNTER — Ambulatory Visit (INDEPENDENT_AMBULATORY_CARE_PROVIDER_SITE_OTHER): Payer: Medicare HMO | Admitting: Vascular Surgery

## 2017-07-02 VITALS — BP 153/80 | HR 76 | Resp 17 | Ht 69.5 in | Wt 260.0 lb

## 2017-07-02 DIAGNOSIS — I739 Peripheral vascular disease, unspecified: Secondary | ICD-10-CM

## 2017-07-02 DIAGNOSIS — I7389 Other specified peripheral vascular diseases: Secondary | ICD-10-CM

## 2017-07-02 DIAGNOSIS — I25810 Atherosclerosis of coronary artery bypass graft(s) without angina pectoris: Secondary | ICD-10-CM | POA: Diagnosis not present

## 2017-07-02 DIAGNOSIS — I10 Essential (primary) hypertension: Secondary | ICD-10-CM

## 2017-07-02 DIAGNOSIS — E1142 Type 2 diabetes mellitus with diabetic polyneuropathy: Secondary | ICD-10-CM | POA: Diagnosis not present

## 2017-07-02 DIAGNOSIS — E7800 Pure hypercholesterolemia, unspecified: Secondary | ICD-10-CM

## 2017-07-02 NOTE — Progress Notes (Signed)
MRN : [id number]  Benjamin Davidson is a 67 y.o. (25-Aug-1950) male who presents with chief complaint of No chief complaint on file. Marland Kitchen  History of Present Illness:   The patient returns to the office for followup and review of the noninvasive studies. There have been no interval changes in lower extremity symptoms. No interval shortening of the patient's claudication distance or development of rest pain symptoms. No new ulcers or wounds have occurred since the last visit.  There have been no significant changes to the patient's overall health care.  The patient denies amaurosis fugax or recent TIA symptoms. There are no recent neurological changes noted. The patient denies history of DVT, PE or superficial thrombophlebitis. The patient denies recent episodes of angina or shortness of breath.   Duplex ultrasound of the left leg arterial shows good flow no hemodynamically significant stenosis  No outpatient medications have been marked as taking for the 07/02/17 encounter (Appointment) with Delana Meyer, Dolores Lory, MD.    Past Medical History:  Diagnosis Date  . Anemia   . Asthma    childhood  . CHF (congestive heart failure) ([place])   . Coronary artery disease   . DM (diabetes mellitus), type 2, uncontrolled, periph vascular complic ([place])    Type II  . Edema    feet/legs  . Hypertension   . Left leg DVT ([place])   . MDD (recurrent major depressive disorder) in remission ([place])   . Peripheral vascular disease ([facility])    Left toe amputations. Dr Lucky Cowboy  . Pure hypercholesterolemia   . Shortness of breath dyspnea   . Syncope    resolved    Past Surgical History:  Procedure Laterality Date  . AMPUTATION TOE Left 03/17/2017   Procedure: FIRST RAY RESECTION LEFT FOOT;  Surgeon: Sharlotte Alamo, DPM;  Location: [facility];  Service: Podiatry;  Laterality: Left;  . CARDIAC CATHETERIZATION    . CATARACT EXTRACTION W/PHACO Left 06/03/2015   Procedure: CATARACT EXTRACTION PHACO AND INTRAOCULAR LENS  PLACEMENT (IOC);  Surgeon: Eulogio Bear, MD;  Location: [facility];  Service: Ophthalmology;  Laterality: Left;  Lot# [id number] H [place]: 1.06 AP%: 5.5 CDE: 3.69  . CORONARY ARTERY BYPASS GRAFT    . EYE SURGERY    . PARS PLANA VITRECTOMY Right 02/17/2015   Procedure: PARS PLANA VITRECTOMY WITH 25 GAUGE, endolaser, right eye;  Surgeon: Milus Height, MD;  Location: [facility];  Service: Ophthalmology;  Laterality: Right;  fluid pack lot # [id number] H endolaser: 200w, [id number]  . PERCUTANEOUS STENT INTERVENTION     right leg  . PERIPHERAL VASCULAR BALLOON ANGIOPLASTY Left 03/20/2017   Procedure: PERIPHERAL VASCULAR BALLOON ANGIOPLASTY;  Surgeon: Katha Cabal, MD;  Location: [place] [facility];  Service: Cardiovascular;  Laterality: Left;  . PERIPHERALLY INSERTED CENTRAL CATHETER INSERTION  2013  . TOE AMPUTATION     left 5th, right 4th and 5th    Social History Social History   Tobacco Use  . Smoking status: Never Smoker  . Smokeless tobacco: Never Used  Substance Use Topics  . Alcohol use: No    Alcohol/week: 0.0 oz  . Drug use: No    Family History Family History  Problem Relation Age of Onset  . Heart disease Sister   . Hyperlipidemia Brother     Allergies  Allergen Reactions  . Lisinopril     Cough Flu like symptoms      REVIEW OF SYSTEMS (Negative unless checked)  Constitutional: [] Weight loss  [] Fever  [] Chills Cardiac: []   Chest pain   [] Chest pressure   [] Palpitations   [] Shortness of breath when laying flat   [] Shortness of breath with exertion. Vascular:  [] Pain in legs with walking   [x] Pain in legs at rest  [] History of DVT   [] Phlebitis   [] Swelling in legs   [] Varicose veins   [] Non-healing ulcers Pulmonary:   [] Uses home oxygen   [] Productive cough   [] Hemoptysis   [] Wheeze  [] COPD   [] Asthma Neurologic:  [] Dizziness   [] Seizures   [] History of stroke   [] History of TIA  [] Aphasia   [] Vissual changes   [] Weakness or numbness in arm   [x] Numbness in  leg Musculoskeletal:   [] Joint swelling   [x] Joint pain   [] Low back pain Hematologic:  [] Easy bruising  [] Easy bleeding   [] Hypercoagulable state   [] Anemic Gastrointestinal:  [] Diarrhea   [] Vomiting  [] Gastroesophageal reflux/heartburn   [] Difficulty swallowing. Genitourinary:  [] Chronic kidney disease   [] Difficult urination  [] Frequent urination   [] Blood in urine Skin:  [] Rashes   [] Ulcers  Psychological:  [] History of anxiety   []  History of major depression.  Physical Examination  There were no vitals filed for this visit. There is no height or weight on file to calculate BMI. Gen: WD/WN, NAD Head: /AT, No temporalis wasting.  Ear/Nose/Throat: Hearing grossly intact, nares w/o erythema or drainage Eyes: PER, EOMI, sclera nonicteric.  Neck: Supple, no large masses.   Pulmonary:  Good air movement, no audible wheezing bilaterally, no use of accessory muscles.  Cardiac: RRR, no JVD Vascular: scattered varicosities present bilaterally.  Mild venous stasis changes to the legs bilaterally.  2+ soft pitting edema Vessel Right Left  Radial Palpable Palpable  PT Trace Palpable Trace Palpable  DP Not Palpable Not Palpable  Gastrointestinal: Non-distended. No guarding/no peritoneal signs.  Musculoskeletal: M/S 5/5 throughout.  Amputation of the left first and fifth.  Neurologic: CN 2-12 intact. Symmetrical.  Speech is fluent. Motor exam as listed above. Psychiatric: Judgment intact, Mood & affect appropriate for pt's clinical situation. Dermatologic: mild rashes no ulcers noted.  No changes consistent with cellulitis. Lymph : No lichenification or skin changes of chronic lymphedema.  CBC Lab Results  Component Value Date   WBC 9.3 03/20/2017   HGB 9.9 (L) 03/20/2017   HCT 30.4 (L) 03/20/2017   MCV 80.5 03/20/2017   PLT 343 03/20/2017    BMET    Component Value Date/Time   NA 138 03/20/2017 0348   NA 141 08/13/2011 0626   K 3.7 03/20/2017 0348   K 3.9 08/13/2011 0626   CL  105 03/20/2017 0348   CL 108 (H) 08/13/2011 0626   CO2 25 03/20/2017 0348   CO2 23 08/13/2011 0626   GLUCOSE 144 (H) 03/20/2017 0348   GLUCOSE 135 (H) 08/13/2011 0626   BUN 17 03/20/2017 0348   BUN 10 08/13/2011 0626   CREATININE 1.02 03/20/2017 0348   CREATININE 1.10 08/13/2011 0626   CALCIUM 8.3 (L) 03/20/2017 0348   CALCIUM 8.0 (L) 08/13/2011 0626   GFRNONAA >60 03/20/2017 0348   GFRNONAA >60 08/13/2011 0626   GFRAA >60 03/20/2017 0348   GFRAA >60 08/13/2011 0626   CrCl cannot be calculated (Patient's most recent lab result is older than the maximum 21 days allowed.).  COAG Lab Results  Component Value Date   INR 1.23 03/20/2017   INR 1.28 03/19/2017   INR 1.37 03/18/2017    Radiology No results found.    Assessment/Plan 1. PAD (peripheral artery disease) ([place])  Recommend:  The patient is status post successful angiogram with intervention.  The patient reports that the claudication symptoms and leg pain is essentially gone.   The patient denies lifestyle limiting changes at this point in time.  No further invasive studies, angiography or surgery at this time The patient should continue walking and begin a more formal exercise program.  The patient should continue antiplatelet therapy and aggressive treatment of the lipid abnormalities  Smoking cessation was again discussed  The patient should continue wearing graduated compression socks 10-15 mmHg strength to control the mild edema.  Patient should undergo noninvasive studies as ordered. The patient will follow up with me after the studies.   - VAS [place] LOWER EXTREMITY ARTERIAL DUPLEX; Future - VAS [place] ABI WITH/WO TBI; Future  2. Essential hypertension Continue antihypertensive medications as already ordered, these medications have been reviewed and there are no changes at this time.   3. Atherosclerosis of coronary artery bypass graft of native heart without angina pectoris Continue cardiac and  antihypertensive medications as already ordered and reviewed, no changes at this time.  Continue statin as ordered and reviewed, no changes at this time  Nitrates PRN for chest pain   4. Type 2 diabetes, controlled, with peripheral neuropathy ([place]) Continue hypoglycemic medications as already ordered, these medications have been reviewed and there are no changes at this time.  Hgb A1C to be monitored as already arranged by primary service   5. Pure hypercholesterolemia Continue statin as ordered and reviewed, no changes at this time   Hortencia Pilar, MD  07/02/2017 1:16 PM

## 2017-07-03 ENCOUNTER — Other Ambulatory Visit: Payer: Self-pay | Admitting: Internal Medicine

## 2017-07-04 NOTE — Telephone Encounter (Signed)
Spoke with patient.  He cancelled appointments with me in February and never followed up after his hospitalization (cancelled appointments).  He is now on Plavix and does continue the Warfarin 5mg  1/2 pill daily EXCEPT for 1 pill on Tues, Saturdays.  He states that Dr. Ronalee Belts has not been monitoring or managing his INR and he has not had it checked since leaving the hospital in February.  Also, I do not see any mention of discontinuing the warfarin in Dr. Domingo Cocking OV notes.    According to my review of patient's hx, he is on warfarin due to hx of Left leg DVT.  In addition, he has CAD, CHF with CABG procedure in 2008.  I do not see any hx of PE or valve replacement.    Discussed danger in not keeping up with regular coumadin checks and need for compliance moving forward.  Patient verbalizes understanding and will come in tomorrow (6/6) to clinic for INR check.  I will give him 1 refill (#30)only for now to allow time to get him back in under management and see where we go from here.    Please let me know if there is anything further to do at this point other than resuming routine coumadin management care.    Thanks.

## 2017-07-04 NOTE — Telephone Encounter (Signed)
Please try to figure this out. You haven't checked INR since February and I haven't seen him. If for DVT, not sure if he is still supposed to be on it Was it prescribed by [facility]?

## 2017-07-05 ENCOUNTER — Telehealth: Payer: Self-pay

## 2017-07-05 ENCOUNTER — Ambulatory Visit (INDEPENDENT_AMBULATORY_CARE_PROVIDER_SITE_OTHER): Payer: Medicare HMO

## 2017-07-05 DIAGNOSIS — Z7901 Long term (current) use of anticoagulants: Secondary | ICD-10-CM

## 2017-07-05 DIAGNOSIS — Z5181 Encounter for therapeutic drug level monitoring: Secondary | ICD-10-CM | POA: Diagnosis not present

## 2017-07-05 LAB — POCT INR: INR: 1.3 — AB (ref 2.0–3.0)

## 2017-07-05 NOTE — Telephone Encounter (Signed)
Honestly, I cannot find records of his past DVTs---and am not sure how far back they go. If he had a single DVT a long time ago, we should stop the coumadin. Ask him when and where the diagnosis was made, and see if we can get records.  Both Dr Ronalee Belts and Dr Rockey Situ don't really address the coumadin on recent visits. plavix won't prevent a recurrence of a DVT if he has had more than one.  If he has had recurrence, or a known hypercoaguable state--it might be better to switch him to a NOAC. Probably need to push up my appt

## 2017-07-05 NOTE — Telephone Encounter (Signed)
Patient is very agitated and anxious today during his coumadin clinic visit.  I have never seen him this emotional before and he seemed to fluctuate between being calm and reasonable to being angry and depressed.  He and I spent an extensive amount of time discussing herbal supplements and drinks that he wants to try.  I explained to him that several of these things could interfere with his coumadin and increase his risk for bleeding, complicating my ability to manage him and he should not take.  At first he seemed reasonable, then became upset and looked as if he would cry.  He apologized for his behavior off and on and reports that he is feeling increased anxiety as of recent and didn't understand what was happening.    Before leaving, patient asked if he could stop the warfarin as he is on Plavix now.  I told him that this would be up to Dr. Rockey Situ and/or Dr. Delana Meyer and encouraged him to discuss this with them.  He asked if I would also get Dr. Alla German opinion.  Dr. Silvio Pate, what is your opinion on whether patient should come off of the warfarin?  Also please be aware of his erratic emotional state.  His next appt with you is on 08/06/17.

## 2017-07-05 NOTE — Patient Instructions (Addendum)
INR today 1.3  Take 1 pill (5mg ) today 6/6/ and 5mg  tomorrow 6/7 and then increase your weekly dose to taking 1/2 pill (2.5mg ) daily  EXCEPT for 5mg  on Tuesdays, thursdays, and Saturdays only.  Recheck in 2 weeks.  Patient educated on risks associated with subtherapeutic reading and also discussed concerns with taking herbal drinks and supplements.  Patient instructed not to use these herbal drinks and supplements; he became agitated and felt upset by this as he is trying to be healthier.  I clearly explained the risks and again encouraged him not to take aloe supplements or anything herbal unless instructed/approved by doctor/coumadin clinic.

## 2017-07-10 NOTE — Telephone Encounter (Signed)
Spoke with patient.  He states that a Dr. Hardin Negus had dx him with the DVT prior to 2007.  This was a family provider in private practice by himself and he has since passed away.  We will not be able to access records.  He denies ever having a repeat occurrence with DVT and/or PE and has had no other issues that he can recall since this episode many years ago.  He is interested in discussing coming off of the coumadin but understands that a NOAC may be too expensive.  He will have to check with his insurance if this even becomes a possibility.  He has asked that we consult with Dr. Rockey Situ.    Note: I have moved up his appointment with you to Thursday, 6/20 at 4pm.  I also see that patient is past due for his AWV.  Should I change appt to 30 minutes and combine the 2?  I wasn't sure as appears he has multiple issues to discuss around acute changes in anxiety, blood sugar management and potential changes in anticoagulation therapy.  Just let me know if I need to change the appointment.  Thanks.

## 2017-07-10 NOTE — Telephone Encounter (Signed)
Noted.  Appointment has been updated to 32minutes.  Patient also notified to come in fasting.  Will wait to hear from Dr. Rockey Situ.

## 2017-07-10 NOTE — Telephone Encounter (Signed)
It sounds like he should come off the coumadin altogether I will forward this also to Dr Rockey Situ so he can comment on it to me prior to next week's appt with him.  Please increase to 30 minutes and I can do the AMW at the same time

## 2017-07-12 NOTE — Telephone Encounter (Signed)
I would probably keep him on warfarin given history of DVT With recurrence of DVT and pulmonary embolism, Detailed below He is very sedentary and given history of DVT 2 would be high risk of third DVT  Shortly after this hospital admission was evaluated in the hospital was suicidal ideation, agitated behavior thx TG    DATE OF ADMISSION:  09/04/2005  DATE OF DISCHARGE:  09/10/2005                                 DISCHARGE SUMMARY   DISCHARGE DIAGNOSES:  1. Pulmonary embolism this admission.that you  2. Coumadin therapy secondary to #1.  3. Abnormal Cardiolite study this admission with suspected coronary      disease to be treated medically at this time.  4. Non-insulin-dependent diabetes.  5. Hypertension.  6. Dyslipidemia.  7. Depression. The patient was seen by Dr. Rhona Raider this admission.  8. Obesity.   The patient is a 67 year old male followed by Dr.  Hardin Negus. He has a prior history of a DVT and was on Coumadin for about a  year and half. He presented to the emergency room after seeing  Dr. Hardin Negus on September 04, 2005 with dyspnea and chest tightness. He was seen  by Dr. Tami Ribas on admission. The patient had been noncompliant with his  medications because of some financial issues. He also has a history of  depression. He needs generic medications to help with compliance wherever  possible. The patient did have a D-dimer that was elevated, and he was  started on IV heparin. Cardiolite study was obtained. This revealed no  ischemia but severe hypokinesis of the septum with multiple fixed perfusion  defects with an EF of 40%. The patient was seen in consult by Dr. Rhona Raider  who felt he had major depressive disorder and was started on Zoloft. He did  have  Dopplers of the lower extremities which were positive for left DVT in  the popliteal vein. The patient was continued on heparin, Coumadin. His INR  goal will be 2.5 to 3 because of multiple pulmonary emboli seen on  CT scan.  Echocardiogram done this admission showed overall LV function was normal  with an EF of 60%. RV size was normal. RV systolic function was normal. We  feel the patient can be discharged on September 10, 2005. He will have a  protime Wednesday in our office.

## 2017-07-13 NOTE — Telephone Encounter (Signed)
Thanks for your input

## 2017-07-19 ENCOUNTER — Ambulatory Visit (INDEPENDENT_AMBULATORY_CARE_PROVIDER_SITE_OTHER): Payer: Medicare HMO | Admitting: General Practice

## 2017-07-19 ENCOUNTER — Ambulatory Visit (INDEPENDENT_AMBULATORY_CARE_PROVIDER_SITE_OTHER): Payer: Medicare HMO | Admitting: Internal Medicine

## 2017-07-19 ENCOUNTER — Encounter: Payer: Self-pay | Admitting: Internal Medicine

## 2017-07-19 ENCOUNTER — Ambulatory Visit: Payer: Medicare HMO

## 2017-07-19 VITALS — BP 140/86 | HR 66 | Temp 97.6°F | Ht 68.5 in | Wt 259.0 lb

## 2017-07-19 DIAGNOSIS — Z5181 Encounter for therapeutic drug level monitoring: Secondary | ICD-10-CM | POA: Diagnosis not present

## 2017-07-19 DIAGNOSIS — Z125 Encounter for screening for malignant neoplasm of prostate: Secondary | ICD-10-CM

## 2017-07-19 DIAGNOSIS — N183 Chronic kidney disease, stage 3 unspecified: Secondary | ICD-10-CM

## 2017-07-19 DIAGNOSIS — R69 Illness, unspecified: Secondary | ICD-10-CM | POA: Diagnosis not present

## 2017-07-19 DIAGNOSIS — I82409 Acute embolism and thrombosis of unspecified deep veins of unspecified lower extremity: Secondary | ICD-10-CM | POA: Diagnosis not present

## 2017-07-19 DIAGNOSIS — F3340 Major depressive disorder, recurrent, in remission, unspecified: Secondary | ICD-10-CM

## 2017-07-19 DIAGNOSIS — Z7901 Long term (current) use of anticoagulants: Secondary | ICD-10-CM

## 2017-07-19 DIAGNOSIS — Z7189 Other specified counseling: Secondary | ICD-10-CM | POA: Diagnosis not present

## 2017-07-19 DIAGNOSIS — E1122 Type 2 diabetes mellitus with diabetic chronic kidney disease: Secondary | ICD-10-CM

## 2017-07-19 DIAGNOSIS — Z0000 Encounter for general adult medical examination without abnormal findings: Secondary | ICD-10-CM

## 2017-07-19 DIAGNOSIS — Z23 Encounter for immunization: Secondary | ICD-10-CM | POA: Diagnosis not present

## 2017-07-19 DIAGNOSIS — E1142 Type 2 diabetes mellitus with diabetic polyneuropathy: Secondary | ICD-10-CM

## 2017-07-19 LAB — POCT INR: INR: 1.4 — AB (ref 2.0–3.0)

## 2017-07-19 MED ORDER — RIVAROXABAN 10 MG PO TABS: 10.0000 mg | ORAL_TABLET | Freq: Every day | ORAL | 11 refills | Status: DC

## 2017-07-19 NOTE — Patient Instructions (Addendum)
Pre visit review using our clinic review tool, if applicable. No additional management support is needed unless otherwise documented below in the visit note.  Take 1 1/2 tablets today (6/20) and 1 tablet tomorrow (6/21) and then change dosage and take 1 tablet daily except 1/2 tablet on Monday and Fridays.  Re-check in 1 weeks.

## 2017-07-19 NOTE — Assessment & Plan Note (Signed)
I have personally reviewed the Medicare Annual Wellness questionnaire and have noted 1. The patient's medical and social history 2. Their use of alcohol, tobacco or illicit drugs 3. Their current medications and supplements 4. The patient's functional ability including ADL's, fall risks, home safety risks and hearing or visual             impairment. 5. Diet and physical activities 6. Evidence for depression or mood disorders  The patients weight, height, BMI and visual acuity have been recorded in the chart I have made referrals, counseling and provided education to the patient based review of the above and I have provided the pt with a written personalized care plan for preventive services.  I have provided you with a copy of your personalized plan for preventive services. Please take the time to review along with your updated medication list.  Pneumovax booster Has FIT at home---urged him to finally do it Yearly flu vaccine Discussed PSA ---will check Discussed exercise

## 2017-07-19 NOTE — Patient Instructions (Addendum)
Please check the price of the xarelto at the pharmacy. If you can afford it, start that and stop the coumadin. You won't need to come in for blood tests if you are on the xarelto. If you can't afford it, just stay on the coumadin. Please do the fecal testing kit. You can try off the gabapentin if you want. Stop the morning dose first. Then if no problems after 2 weeks, you can try off the evening dose.

## 2017-07-19 NOTE — Assessment & Plan Note (Signed)
Hopefully still good control Will check A1c 

## 2017-07-19 NOTE — Assessment & Plan Note (Signed)
At least 2 DVTs---and associated PE  Needs ongoing Rx Will try to switch to daily xarelto if he can afford---otherwise continue the coumadin

## 2017-07-19 NOTE — Progress Notes (Signed)
Hearing Screening   Method: Audiometry   125Hz  250Hz  500Hz  1000Hz  2000Hz  3000Hz  4000Hz  6000Hz  8000Hz   Right ear:   20 20 20  20     Left ear:   20 20 20  20     Vision Screening Comments: January 2019

## 2017-07-19 NOTE — Assessment & Plan Note (Signed)
On ARB Will recheck labs 

## 2017-07-19 NOTE — Progress Notes (Signed)
Subjective:    Patient ID: Benjamin Davidson, male    DOB: Aug 22, 1950, 67 y.o.   MRN: [id number]  HPI Here for Medicare wellness visit and follow up of chronic health conditions Reviewed form and advanced directives Reviewed other doctors No alcohol or tobacco Not exercising--discussed Vision is okay Hearing is fine 1 fall in February--no injury but trouble getting up Chronic mood issues---mostly in remission Independent with instrumental ADLs Memory is okay--never great but stable  Sugar controlled generally good Checks bid and occasionally more No hypoglycemic reactions Feet not clearly numb  Toe amputation in February Has recovered from this Continues with vascular surgeon---recent vascular studies were stable  Mood has been good  No regular depression Continues on the sertraline  No chest pain No palpitations Chronic DOE--seems stable No dizziness or syncope  Current Outpatient Medications on File Prior to Visit  Medication Sig Dispense Refill  . aspirin EC 81 MG tablet Take 1 tablet (81 mg total) by mouth daily. 150 tablet 2  . carvedilol (COREG) 6.25 MG tablet TAKE 1 TABLET BY MOUTH TWICE A DAY 180 tablet 2  . clopidogrel (PLAVIX) 75 MG tablet Take 1 tablet (75 mg total) by mouth daily with breakfast. 30 tablet 4  . furosemide (LASIX) 20 MG tablet TAKE 1 TABLET BY MOUTH ONCE DAILY MAY TAKE ONE ADDITIONAL TAB IF INCREASEDSWELLING 60 tablet 3  . gabapentin (NEURONTIN) 300 MG capsule Take 1 capsule (300 mg total) by mouth 2 (two) times daily. 60 capsule 11  . insulin NPH-regular Human (NOVOLIN 70/30) (70-30) 100 UNIT/ML injection Inject 35 Units into the skin 2 (two) times daily.    Marland Kitchen losartan (COZAAR) 25 MG tablet Take 1 tablet (25 mg total) by mouth daily. 30 tablet 6  . potassium chloride (K-DUR) 10 MEQ tablet Take 1 tablet (10 mEq total) by mouth daily. 90 tablet 3  . rosuvastatin (CRESTOR) 20 MG tablet Take 1 tablet (20 mg total) by mouth daily. 90 tablet 3  .  sertraline (ZOLOFT) 100 MG tablet Take 100 mg by mouth at bedtime.    Karen Chafe INSULIN SYRINGE 31G X 5/16" 0.5 ML MISC USE AS DIRECTED TWICE DAILY 100 each 7  . warfarin (COUMADIN) 5 MG tablet Take 2.5 mg by mouth at bedtime.    Marland Kitchen warfarin (COUMADIN) 5 MG tablet TAKE AS DIRECTED 30 tablet 0   No current facility-administered medications on file prior to visit.     Allergies  Allergen Reactions  . Lisinopril     Cough Flu like symptoms     Past Medical History:  Diagnosis Date  . Anemia   . Asthma    childhood  . CHF (congestive heart failure) ([place])   . Coronary artery disease   . DM (diabetes mellitus), type 2, uncontrolled, periph vascular complic ([place])    Type II  . Edema    feet/legs  . Hypertension   . Left leg DVT ([place])   . MDD (recurrent major depressive disorder) in remission ([place])   . Peripheral vascular disease (HCC)    Left toe amputations. Dr Lucky Cowboy  . Pure hypercholesterolemia   . Shortness of breath dyspnea   . Syncope    resolved    Past Surgical History:  Procedure Laterality Date  . AMPUTATION TOE Left 03/17/2017   Procedure: FIRST RAY RESECTION LEFT FOOT;  Surgeon: Sharlotte Alamo, DPM;  Location: [facility];  Service: Podiatry;  Laterality: Left;  . CARDIAC CATHETERIZATION    . CATARACT EXTRACTION W/PHACO Left 06/03/2015  Procedure: CATARACT EXTRACTION PHACO AND INTRAOCULAR LENS PLACEMENT (IOC);  Surgeon: Eulogio Bear, MD;  Location: [facility];  Service: Ophthalmology;  Laterality: Left;  Lot# [id number] H [place]: 1.06 AP%: 5.5 CDE: 3.69  . CORONARY ARTERY BYPASS GRAFT    . EYE SURGERY    . PARS PLANA VITRECTOMY Right 02/17/2015   Procedure: PARS PLANA VITRECTOMY WITH 25 GAUGE, endolaser, right eye;  Surgeon: Milus Height, MD;  Location: [facility];  Service: Ophthalmology;  Laterality: Right;  fluid pack lot # [id number] H endolaser: 200w, [id number]  . PERCUTANEOUS STENT INTERVENTION     right leg  . PERIPHERAL VASCULAR BALLOON ANGIOPLASTY Left 03/20/2017    Procedure: PERIPHERAL VASCULAR BALLOON ANGIOPLASTY;  Surgeon: Katha Cabal, MD;  Location: [facility];  Service: Cardiovascular;  Laterality: Left;  . PERIPHERALLY INSERTED CENTRAL CATHETER INSERTION  2013  . TOE AMPUTATION     left 5th, right 4th and 5th    Family History  Problem Relation Age of Onset  . Heart disease Sister   . Hyperlipidemia Brother     Social History   Socioeconomic History  . Marital status: Divorced    Spouse name: Not on file  . Number of children: 0  . Years of education: Not on file  . Highest education level: Not on file  Occupational History  . Occupation: [name]    Comment: Disabled since 2000's  Social Needs  . Financial resource strain: Not on file  . Food insecurity:    Worry: Not on file    Inability: Not on file  . Transportation needs:    Medical: Not on file    Non-medical: Not on file  Tobacco Use  . Smoking status: Never Smoker  . Smokeless tobacco: Never Used  Substance and Sexual Activity  . Alcohol use: No    Alcohol/week: 0.0 oz  . Drug use: No  . Sexual activity: Not on file  Lifestyle  . Physical activity:    Days per week: Not on file    Minutes per session: Not on file  . Stress: Not on file  Relationships  . Social connections:    Talks on phone: Not on file    Gets together: Not on file    Attends religious service: Not on file    Active member of club or organization: Not on file    Attends meetings of clubs or organizations: Not on file    Relationship status: Not on file  . Intimate partner violence:    Fear of current or ex partner: Not on file    Emotionally abused: Not on file    Physically abused: Not on file    Forced sexual activity: Not on file  Other Topics Concern  . Not on file  Social History Narrative   No living will   Requests [name], sister, as health care POA. May have done official forms   Would accept resuscitation attempts   Not sure about tube feeds    Review of Systems Frustrated---had lost weight and now put it back on.  Noncompliant with low calorie eating at times Sleeps well Wears seat belt Teeth are okay--- overdue for dentist No foot ulcers. No rash Bowels are okay No heartburn or dysphagia    Objective:   Physical Exam  Constitutional: He is oriented to person, place, and time. He appears well-developed. No distress.  HENT:  Mouth/Throat: Oropharynx is clear and moist. No oropharyngeal exudate.  Neck: No thyromegaly present.  Cardiovascular:  Normal rate, regular rhythm and normal heart sounds. Exam reveals no gallop.  No murmur heard. Faint pedal pulses  Respiratory: Effort normal and breath sounds normal. No respiratory distress. He has no wheezes. He has no rales.  GI: Soft. There is no tenderness.  Musculoskeletal:  Bilateral amputations--1st and 2nd on left, 4th and 5th on right No ulcers  Lymphadenopathy:    He has no cervical adenopathy.  Neurological: He is alert and oriented to person, place, and time.  President--- "Daisy Floro, Obama, Bush" [phone] don't know D-l-o-r-w Recall 3/3   Skin: No rash noted.  Psychiatric: He has a normal mood and affect. His behavior is normal.           Assessment & Plan:

## 2017-07-19 NOTE — Assessment & Plan Note (Signed)
See social history 

## 2017-07-19 NOTE — Addendum Note (Signed)
Addended by: Pilar Grammes on: 07/19/2017 05:33 PM   Modules accepted: Orders

## 2017-07-19 NOTE — Assessment & Plan Note (Signed)
No major issues on the sertraline Probably needs to continue indefinitely

## 2017-07-20 LAB — RENAL FUNCTION PANEL
Albumin: 4.3 g/dL (ref 3.5–5.2)
BUN: 18 mg/dL (ref 6–23)
CALCIUM: 9.4 mg/dL (ref 8.4–10.5)
CO2: 31 meq/L (ref 19–32)
CREATININE: 1.17 mg/dL (ref 0.40–1.50)
Chloride: 104 mEq/L (ref 96–112)
GFR: 66.38 mL/min (ref >60.00)
GLUCOSE: 67 mg/dL — AB (ref 70–99)
PHOSPHORUS: 3.7 mg/dL (ref 2.3–4.6)
POTASSIUM: 4.6 meq/L (ref 3.5–5.1)
Sodium: 142 mEq/L (ref 135–145)

## 2017-07-20 LAB — HEMOGLOBIN A1C: Hgb A1c MFr Bld: 7 % — ABNORMAL HIGH (ref 4.6–6.5)

## 2017-07-20 LAB — PSA, MEDICARE: PSA: 0.14 ng/mL (ref 0.10–4.00)

## 2017-07-20 MED ORDER — RIVAROXABAN 10 MG PO TABS: 10.0000 mg | ORAL_TABLET | Freq: Every day | ORAL | 11 refills | Status: DC

## 2017-07-20 NOTE — Addendum Note (Signed)
Addended by: Pilar Grammes on: 07/20/2017 07:39 AM   Modules accepted: Orders

## 2017-07-24 ENCOUNTER — Telehealth: Payer: Self-pay

## 2017-07-24 NOTE — Telephone Encounter (Signed)
Patient has stopped the coumadin and started Xarelto on Friday 6/21.  I spent 63minutes with patient on the phone discussing his questions and concerns regarding this medication and general dietary recommendations.  He also wanted to let PCP know that he has started walking every morning.  Patient knows to call us if he has any further questions.  FYI to Dr. Silvio Pate.

## 2017-07-25 ENCOUNTER — Other Ambulatory Visit (INDEPENDENT_AMBULATORY_CARE_PROVIDER_SITE_OTHER): Payer: Self-pay | Admitting: Vascular Surgery

## 2017-07-25 NOTE — Telephone Encounter (Signed)
I am so glad that he was able to get the xarelto. It should be easier and safer for him!

## 2017-07-26 ENCOUNTER — Ambulatory Visit: Payer: Medicare HMO

## 2017-08-01 ENCOUNTER — Other Ambulatory Visit: Payer: Self-pay | Admitting: Internal Medicine

## 2017-08-06 ENCOUNTER — Ambulatory Visit: Payer: Medicare HMO | Admitting: Internal Medicine

## 2017-08-11 ENCOUNTER — Other Ambulatory Visit: Payer: Self-pay | Admitting: Cardiovascular Disease

## 2017-08-17 DIAGNOSIS — E1142 Type 2 diabetes mellitus with diabetic polyneuropathy: Secondary | ICD-10-CM | POA: Diagnosis not present

## 2017-08-23 ENCOUNTER — Ambulatory Visit (INDEPENDENT_AMBULATORY_CARE_PROVIDER_SITE_OTHER): Payer: Medicare HMO | Admitting: Vascular Surgery

## 2017-08-23 ENCOUNTER — Encounter (INDEPENDENT_AMBULATORY_CARE_PROVIDER_SITE_OTHER): Payer: Medicare HMO

## 2017-08-23 ENCOUNTER — Encounter (INDEPENDENT_AMBULATORY_CARE_PROVIDER_SITE_OTHER): Payer: Self-pay

## 2017-09-12 DIAGNOSIS — I739 Peripheral vascular disease, unspecified: Secondary | ICD-10-CM | POA: Diagnosis not present

## 2017-09-12 DIAGNOSIS — B351 Tinea unguium: Secondary | ICD-10-CM | POA: Diagnosis not present

## 2017-09-12 DIAGNOSIS — L851 Acquired keratosis [keratoderma] palmaris et plantaris: Secondary | ICD-10-CM | POA: Diagnosis not present

## 2017-09-20 ENCOUNTER — Ambulatory Visit: Payer: Self-pay | Admitting: General Practice

## 2017-09-25 DIAGNOSIS — E113593 Type 2 diabetes mellitus with proliferative diabetic retinopathy without macular edema, bilateral: Secondary | ICD-10-CM | POA: Diagnosis not present

## 2017-10-04 ENCOUNTER — Ambulatory Visit (INDEPENDENT_AMBULATORY_CARE_PROVIDER_SITE_OTHER): Payer: Medicare HMO

## 2017-10-04 ENCOUNTER — Encounter (INDEPENDENT_AMBULATORY_CARE_PROVIDER_SITE_OTHER): Payer: Self-pay | Admitting: Vascular Surgery

## 2017-10-04 ENCOUNTER — Ambulatory Visit (INDEPENDENT_AMBULATORY_CARE_PROVIDER_SITE_OTHER): Payer: Medicare HMO | Admitting: Vascular Surgery

## 2017-10-04 VITALS — BP 147/79 | HR 73 | Resp 16 | Ht 68.5 in | Wt 256.0 lb

## 2017-10-04 DIAGNOSIS — E7800 Pure hypercholesterolemia, unspecified: Secondary | ICD-10-CM

## 2017-10-04 DIAGNOSIS — E1142 Type 2 diabetes mellitus with diabetic polyneuropathy: Secondary | ICD-10-CM

## 2017-10-04 DIAGNOSIS — I739 Peripheral vascular disease, unspecified: Secondary | ICD-10-CM | POA: Diagnosis not present

## 2017-10-04 DIAGNOSIS — I82409 Acute embolism and thrombosis of unspecified deep veins of unspecified lower extremity: Secondary | ICD-10-CM

## 2017-10-04 DIAGNOSIS — I10 Essential (primary) hypertension: Secondary | ICD-10-CM

## 2017-10-04 NOTE — Progress Notes (Signed)
MRN : [id number]  Benjamin Davidson is a 67 y.o. (Jul 07, 1950) male who presents with chief complaint of No chief complaint on file. Marland Kitchen  History of Present Illness:   The patient returns to the office for followup and review of the noninvasive studies. There have been no interval changes in lower extremity symptoms. No interval shortening of the patient's claudication distance or development of rest pain symptoms. No new ulcers or wounds have occurred since the last visit.  There have been no significant changes to the patient's overall health care.  The patient denies amaurosis fugax or recent TIA symptoms. There are no recent neurological changes noted. The patient denies history of DVT, PE or superficial thrombophlebitis. The patient denies recent episodes of angina or shortness of breath.   Duplex ultrasound of the left leg arterial shows good flow no hemodynamically significant stenosis TBI's Rt=[place] and Lt=[place]  Triphasic signals bilateral PT  No outpatient medications have been marked as taking for the 10/04/17 encounter (Appointment) with Delana Meyer, Dolores Lory, MD.    Past Medical History:  Diagnosis Date  . Anemia   . Asthma    childhood  . CHF (congestive heart failure) ([place])   . Coronary artery disease   . DM (diabetes mellitus), type 2, uncontrolled, periph vascular complic ([place])    Type II  . Edema    feet/legs  . Hypertension   . Left leg DVT ([place])   . MDD (recurrent major depressive disorder) in remission ([place])   . Peripheral vascular disease (HCC)    Left toe amputations. Dr Lucky Cowboy  . Pure hypercholesterolemia   . Shortness of breath dyspnea   . Syncope    resolved    Past Surgical History:  Procedure Laterality Date  . AMPUTATION TOE Left 03/17/2017   Procedure: FIRST RAY RESECTION LEFT FOOT;  Surgeon: Sharlotte Alamo, DPM;  Location: [facility];  Service: Podiatry;  Laterality: Left;  . CARDIAC CATHETERIZATION    . CATARACT EXTRACTION W/PHACO Left 06/03/2015   Procedure: CATARACT EXTRACTION PHACO AND INTRAOCULAR LENS PLACEMENT (IOC);  Surgeon: Eulogio Bear, MD;  Location: [facility];  Service: Ophthalmology;  Laterality: Left;  Lot# [id number] H [place]: 1.06 AP%: 5.5 CDE: 3.69  . CORONARY ARTERY BYPASS GRAFT    . EYE SURGERY    . PARS PLANA VITRECTOMY Right 02/17/2015   Procedure: PARS PLANA VITRECTOMY WITH 25 GAUGE, endolaser, right eye;  Surgeon: Milus Height, MD;  Location: [facility];  Service: Ophthalmology;  Laterality: Right;  fluid pack lot # [id number] H endolaser: 200w, [id number]  . PERCUTANEOUS STENT INTERVENTION     right leg  . PERIPHERAL VASCULAR BALLOON ANGIOPLASTY Left 03/20/2017   Procedure: PERIPHERAL VASCULAR BALLOON ANGIOPLASTY;  Surgeon: Katha Cabal, MD;  Location: [facility];  Service: Cardiovascular;  Laterality: Left;  . PERIPHERALLY INSERTED CENTRAL CATHETER INSERTION  2013  . TOE AMPUTATION     left 5th, right 4th and 5th    Social History Social History   Tobacco Use  . Smoking status: Never Smoker  . Smokeless tobacco: Never Used  Substance Use Topics  . Alcohol use: No    Alcohol/week: 0.0 standard drinks  . Drug use: No    Family History Family History  Problem Relation Age of Onset  . Heart disease Sister   . Hyperlipidemia Brother     Allergies  Allergen Reactions  . Lisinopril     Cough Flu like symptoms      REVIEW OF SYSTEMS (Negative unless checked)  Constitutional: [] Weight loss  [] Fever  [] Chills Cardiac: [] Chest pain   [] Chest pressure   [] Palpitations   [] Shortness of breath when laying flat   [] Shortness of breath with exertion. Vascular:  [x] Pain in legs with walking   [] Pain in legs at rest  [] History of DVT   [] Phlebitis   [] Swelling in legs   [] Varicose veins   [] Non-healing ulcers Pulmonary:   [] Uses home oxygen   [] Productive cough   [] Hemoptysis   [] Wheeze  [] COPD   [] Asthma Neurologic:  [] Dizziness   [] Seizures   [] History of stroke   [] History of TIA  [] Aphasia    [] Vissual changes   [] Weakness or numbness in arm   [] Weakness or numbness in leg Musculoskeletal:   [] Joint swelling   [] Joint pain   [] Low back pain Hematologic:  [] Easy bruising  [] Easy bleeding   [] Hypercoagulable state   [] Anemic Gastrointestinal:  [] Diarrhea   [] Vomiting  [] Gastroesophageal reflux/heartburn   [] Difficulty swallowing. Genitourinary:  [] Chronic kidney disease   [] Difficult urination  [] Frequent urination   [] Blood in urine Skin:  [] Rashes   [] Ulcers  Psychological:  [] History of anxiety   []  History of major depression.  Physical Examination  There were no vitals filed for this visit. There is no height or weight on file to calculate BMI. Gen: WD/WN, NAD Head: [place]/AT, No temporalis wasting.  Ear/Nose/Throat: Hearing grossly intact, nares w/o erythema or drainage Eyes: PER, EOMI, sclera nonicteric.  Neck: Supple, no large masses.   Pulmonary:  Good air movement, no audible wheezing bilaterally, no use of accessory muscles.  Cardiac: RRR, no JVD Vascular:  Vessel Right Left  Radial Palpable Palpable  PT Trace Palpable Trace Palpable  DP Not Palpable Not Palpable  Gastrointestinal: Non-distended. No guarding/no peritoneal signs.  Musculoskeletal: M/S 5/5 throughout.  No deformity or atrophy.  Neurologic: CN 2-12 intact. Symmetrical.  Speech is fluent. Motor exam as listed above. Psychiatric: Judgment intact, Mood & affect appropriate for pt's clinical situation. Dermatologic: No rashes or ulcers noted.  No changes consistent with cellulitis. Lymph : No lichenification or skin changes of chronic lymphedema.  CBC Lab Results  Component Value Date   WBC 9.3 03/20/2017   HGB 9.9 (L) 03/20/2017   HCT 30.4 (L) 03/20/2017   MCV 80.5 03/20/2017   PLT 343 03/20/2017    BMET    Component Value Date/Time   NA 142 07/19/2017 1709   NA 141 08/13/2011 0626   K 4.6 07/19/2017 1709   K 3.9 08/13/2011 0626   CL 104 07/19/2017 1709   CL 108 (H) 08/13/2011 0626   CO2  31 07/19/2017 1709   CO2 23 08/13/2011 0626   GLUCOSE 67 (L) 07/19/2017 1709   GLUCOSE 135 (H) 08/13/2011 0626   BUN 18 07/19/2017 1709   BUN 10 08/13/2011 0626   CREATININE 1.17 07/19/2017 1709   CREATININE 1.10 08/13/2011 0626   CALCIUM 9.4 07/19/2017 1709   CALCIUM 8.0 (L) 08/13/2011 0626   GFRNONAA >60 03/20/2017 0348   GFRNONAA >60 08/13/2011 0626   GFRAA >60 03/20/2017 0348   GFRAA >60 08/13/2011 0626   CrCl cannot be calculated (Patient's most recent lab result is older than the maximum 21 days allowed.).  COAG Lab Results  Component Value Date   INR 1.4 (A) 07/19/2017   INR 1.3 (A) 07/05/2017   INR 1.23 03/20/2017    Radiology No results found.   Assessment/Plan 1. PAD (peripheral artery disease) (HCC)  Recommend:  The patient has evidence of atherosclerosis of  the lower extremities with claudication.  The patient does not voice lifestyle limiting changes at this point in time.  Noninvasive studies do not suggest clinically significant change.  No invasive studies, angiography or surgery at this time The patient should continue walking and begin a more formal exercise program.  The patient should continue antiplatelet therapy and aggressive treatment of the lipid abnormalities  No changes in the patient's medications at this time  The patient should continue wearing graduated compression socks 10-15 mmHg strength to control the mild edema.    2. Recurrent deep vein thrombosis (DVT) (HCC) Recommend:   No surgery or intervention at this point in time.  IVC filter is not indicated at present.  The patient is on anticoagulation   Elevation was stressed, use of a recliner was discussed.  I have had a long discussion with the patient regarding DVT and post phlebitic changes such as swelling and why it  causes symptoms such as pain.  The patient will wear graduated compression stockings class 1 (20-30 mmHg), beginning after three full days of anticoagulation,  on a daily basis a prescription was given. The patient will  beginning wearing the stockings first thing in the morning and removing them in the evening. The patient is instructed specifically not to sleep in the stockings.  In addition, behavioral modification including elevation during the day and avoidance of prolonged dependency will be initiated.    The patient will continue anticoagulation for now as there have not been any problems or complications at this point.    3. Pure hypercholesterolemia Continue statin as ordered and reviewed, no changes at this time   4. Type 2 diabetes, controlled, with peripheral neuropathy ([place]) Continue hypoglycemic medications as already ordered, these medications have been reviewed and there are no changes at this time.  Hgb A1C to be monitored as already arranged by primary service   5. Essential hypertension Continue antihypertensive medications as already ordered, these medications have been reviewed and there are no changes at this time.    Hortencia Pilar, MD  10/04/2017 1:00 PM

## 2017-10-07 ENCOUNTER — Encounter (INDEPENDENT_AMBULATORY_CARE_PROVIDER_SITE_OTHER): Payer: Self-pay | Admitting: Vascular Surgery

## 2017-10-16 DIAGNOSIS — R69 Illness, unspecified: Secondary | ICD-10-CM | POA: Diagnosis not present

## 2017-10-27 ENCOUNTER — Other Ambulatory Visit: Payer: Self-pay | Admitting: Cardiovascular Disease

## 2017-11-20 ENCOUNTER — Telehealth: Payer: Self-pay

## 2017-11-20 NOTE — Telephone Encounter (Signed)
Patient wants to discuss trying OTC keto diet pills.  We discussed at length the concerns with starting anything like this but if he is convinced he wants to try, then he should bring in the exact info of what he wants to use to discuss with the MD.  I do not recommend he start anything without consulting with Dr. Silvio Pate first as this could interact with his current medications and health problems resulting in serious adverse effects.  Patient verbalizes understanding and thanks me for my call.

## 2017-11-20 NOTE — Telephone Encounter (Signed)
Copied from [place] [id number]. Topic: General - Other >> Nov 19, 2017  9:39 AM Bea Graff, NT wrote: Reason for CRM: Pt requesting a call from [facility] to discuss diet pills.

## 2017-11-26 DIAGNOSIS — H2511 Age-related nuclear cataract, right eye: Secondary | ICD-10-CM | POA: Diagnosis not present

## 2017-11-26 DIAGNOSIS — E1142 Type 2 diabetes mellitus with diabetic polyneuropathy: Secondary | ICD-10-CM | POA: Diagnosis not present

## 2017-12-03 ENCOUNTER — Encounter: Payer: Self-pay | Admitting: *Deleted

## 2017-12-03 ENCOUNTER — Other Ambulatory Visit: Payer: Self-pay

## 2017-12-06 NOTE — Discharge Instructions (Signed)

## 2017-12-10 ENCOUNTER — Other Ambulatory Visit: Payer: Self-pay | Admitting: Cardiovascular Disease

## 2017-12-10 ENCOUNTER — Encounter
Admission: RE | Disposition: A | Discharge status: Home / Self Care | Payer: Self-pay | Source: Ambulatory Visit | Attending: Ophthalmology

## 2017-12-10 ENCOUNTER — Ambulatory Visit: Payer: Medicare HMO | Admitting: Anesthesiology

## 2017-12-10 ENCOUNTER — Ambulatory Visit
Admission: RE | Admit: 2017-12-10 | Discharge: 2017-12-10 | Disposition: A | Discharge status: Home / Self Care | Payer: Medicare HMO | Source: Ambulatory Visit | Attending: Ophthalmology | Admitting: Ophthalmology

## 2017-12-10 DIAGNOSIS — Z79891 Long term (current) use of opiate analgesic: Secondary | ICD-10-CM | POA: Insufficient documentation

## 2017-12-10 DIAGNOSIS — I509 Heart failure, unspecified: Secondary | ICD-10-CM | POA: Insufficient documentation

## 2017-12-10 DIAGNOSIS — E1151 Type 2 diabetes mellitus with diabetic peripheral angiopathy without gangrene: Secondary | ICD-10-CM | POA: Diagnosis not present

## 2017-12-10 DIAGNOSIS — H2511 Age-related nuclear cataract, right eye: Secondary | ICD-10-CM | POA: Diagnosis not present

## 2017-12-10 DIAGNOSIS — I110 Hypertensive heart disease with heart failure: Secondary | ICD-10-CM | POA: Insufficient documentation

## 2017-12-10 DIAGNOSIS — E1140 Type 2 diabetes mellitus with diabetic neuropathy, unspecified: Secondary | ICD-10-CM | POA: Insufficient documentation

## 2017-12-10 DIAGNOSIS — H4311 Vitreous hemorrhage, right eye: Secondary | ICD-10-CM | POA: Diagnosis not present

## 2017-12-10 DIAGNOSIS — Z79899 Other long term (current) drug therapy: Secondary | ICD-10-CM | POA: Diagnosis not present

## 2017-12-10 DIAGNOSIS — H43812 Vitreous degeneration, left eye: Secondary | ICD-10-CM | POA: Diagnosis not present

## 2017-12-10 DIAGNOSIS — I2510 Atherosclerotic heart disease of native coronary artery without angina pectoris: Secondary | ICD-10-CM | POA: Diagnosis not present

## 2017-12-10 DIAGNOSIS — Z951 Presence of aortocoronary bypass graft: Secondary | ICD-10-CM | POA: Insufficient documentation

## 2017-12-10 DIAGNOSIS — E113593 Type 2 diabetes mellitus with proliferative diabetic retinopathy without macular edema, bilateral: Secondary | ICD-10-CM | POA: Diagnosis not present

## 2017-12-10 DIAGNOSIS — Z7902 Long term (current) use of antithrombotics/antiplatelets: Secondary | ICD-10-CM | POA: Diagnosis not present

## 2017-12-10 DIAGNOSIS — R69 Illness, unspecified: Secondary | ICD-10-CM | POA: Diagnosis not present

## 2017-12-10 DIAGNOSIS — H278 Other specified disorders of lens: Secondary | ICD-10-CM | POA: Insufficient documentation

## 2017-12-10 DIAGNOSIS — H25811 Combined forms of age-related cataract, right eye: Secondary | ICD-10-CM | POA: Diagnosis not present

## 2017-12-10 DIAGNOSIS — E785 Hyperlipidemia, unspecified: Secondary | ICD-10-CM | POA: Insufficient documentation

## 2017-12-10 DIAGNOSIS — Z7982 Long term (current) use of aspirin: Secondary | ICD-10-CM | POA: Insufficient documentation

## 2017-12-10 DIAGNOSIS — Z794 Long term (current) use of insulin: Secondary | ICD-10-CM | POA: Diagnosis not present

## 2017-12-10 DIAGNOSIS — E1136 Type 2 diabetes mellitus with diabetic cataract: Secondary | ICD-10-CM | POA: Insufficient documentation

## 2017-12-10 DIAGNOSIS — F329 Major depressive disorder, single episode, unspecified: Secondary | ICD-10-CM | POA: Diagnosis not present

## 2017-12-10 DIAGNOSIS — H59021 Cataract (lens) fragments in eye following cataract surgery, right eye: Secondary | ICD-10-CM | POA: Diagnosis not present

## 2017-12-10 HISTORY — PX: CATARACT EXTRACTION W/PHACO: SHX586

## 2017-12-10 LAB — GLUCOSE, CAPILLARY
GLUCOSE-CAPILLARY: 101 mg/dL — AB (ref 70–99)
Glucose-Capillary: 100 mg/dL — ABNORMAL HIGH (ref 70–99)

## 2017-12-10 SURGERY — PHACOEMULSIFICATION, CATARACT, WITH IOL INSERTION
Anesthesia: Monitor Anesthesia Care | Laterality: Right

## 2017-12-10 MED ORDER — EPINEPHRINE PF 1 MG/ML IJ SOLN
INTRAOCULAR | Status: DC | PRN
  Administered 2017-12-10: 40 mL via OPHTHALMIC

## 2017-12-10 MED ORDER — LIDOCAINE HCL (PF) 2 % IJ SOLN
INTRAOCULAR | Status: DC | PRN
  Administered 2017-12-10: 1 mL via INTRAOCULAR

## 2017-12-10 MED ORDER — TETRACAINE HCL 0.5 % OP SOLN
1.0000 [drp] | OPHTHALMIC | Status: DC | PRN
  Administered 2017-12-10 (×2): 1 [drp] via OPHTHALMIC

## 2017-12-10 MED ORDER — FENTANYL CITRATE (PF) 100 MCG/2ML IJ SOLN
INTRAMUSCULAR | Status: DC | PRN
  Administered 2017-12-10 (×2): 50 ug via INTRAVENOUS

## 2017-12-10 MED ORDER — MOXIFLOXACIN HCL 0.5 % OP SOLN: OPHTHALMIC | Status: DC | PRN

## 2017-12-10 MED ORDER — SODIUM HYALURONATE 10 MG/ML IO SOLN
INTRAOCULAR | Status: DC | PRN
  Administered 2017-12-10: 0.55 mL via INTRAOCULAR

## 2017-12-10 MED ORDER — ACETAZOLAMIDE ER 500 MG PO CP12: 500.0000 mg | ORAL_CAPSULE | Freq: Two times a day (BID) | ORAL | 0 refills | Status: DC

## 2017-12-10 MED ORDER — SODIUM HYALURONATE 23 MG/ML IO SOLN
INTRAOCULAR | Status: DC | PRN
  Administered 2017-12-10: 0.6 mL via INTRAOCULAR

## 2017-12-10 MED ORDER — TRYPAN BLUE 0.06 % OP SOLN
OPHTHALMIC | Status: DC | PRN
  Administered 2017-12-10: 0.5 mL via INTRAOCULAR

## 2017-12-10 MED ORDER — ARMC OPHTHALMIC DILATING DROPS
1.0000 "application " | OPHTHALMIC | Status: DC | PRN
  Administered 2017-12-10 (×3): 1 via OPHTHALMIC

## 2017-12-10 MED ORDER — ACETAZOLAMIDE ER 500 MG PO CP12
500.0000 mg | ORAL_CAPSULE | Freq: Two times a day (BID) | ORAL | Status: DC
  Administered 2017-12-10: 500 mg via ORAL

## 2017-12-10 MED ORDER — MIDAZOLAM HCL 2 MG/2ML IJ SOLN
INTRAMUSCULAR | Status: DC | PRN
  Administered 2017-12-10: 2 mg via INTRAVENOUS

## 2017-12-10 SURGICAL SUPPLY — 21 items
CANNULA ANT/CHMB 27G (MISCELLANEOUS) ×1 IMPLANT
CANNULA ANT/CHMB 27GA (MISCELLANEOUS) ×6 IMPLANT
DISSECTOR HYDRO NUCLEUS 50X22 (MISCELLANEOUS) ×2 IMPLANT
GLOVE SURG LX 7.5 STRW (GLOVE) ×1
GLOVE SURG LX STRL 7.5 STRW (GLOVE) ×1 IMPLANT
GLOVE SURG SYN 8.5  E (GLOVE) ×1
GLOVE SURG SYN 8.5 E (GLOVE) ×1 IMPLANT
GLOVE SURG SYN 8.5 PF PI (GLOVE) ×1 IMPLANT
GOWN STRL REUS W/ TWL LRG LVL3 (GOWN DISPOSABLE) ×2 IMPLANT
GOWN STRL REUS W/TWL LRG LVL3 (GOWN DISPOSABLE) ×4
MARKER SKIN DUAL TIP RULER LAB (MISCELLANEOUS) ×2 IMPLANT
PACK DR. KING ARMS (PACKS) ×2 IMPLANT
PACK EYE AFTER SURG (MISCELLANEOUS) ×2 IMPLANT
PACK OPTHALMIC (MISCELLANEOUS) ×2 IMPLANT
RETRACT IRIS FLEX HOOK GRIESHA (MISCELLANEOUS) ×2
RETRACTOR IRIS FLX HOOK GRIESH (MISCELLANEOUS) IMPLANT
RING CAPSULAR 14C RIGHT (Ring) ×1 IMPLANT
SYR 3ML LL SCALE MARK (SYRINGE) ×2 IMPLANT
SYR TB 1ML LUER SLIP (SYRINGE) ×2 IMPLANT
WATER STERILE IRR 500ML POUR (IV SOLUTION) ×2 IMPLANT
WIPE NON LINTING 3.25X3.25 (MISCELLANEOUS) ×2 IMPLANT

## 2017-12-10 NOTE — H&P (Signed)
The History and Physical notes are on paper, have been signed, and are to be scanned.   I have examined the patient and there are no changes to the H&P.   Benjamin Davidson 12/10/2017 8:01 AM

## 2017-12-10 NOTE — Op Note (Signed)
OPERATIVE NOTE  Benjamin Davidson [id number] 12/10/2017   PREOPERATIVE DIAGNOSIS:  Nuclear sclerotic cataract right eye.  H25.11   POSTOPERATIVE DIAGNOSIS:     1.  Nuclear sclerotic cataract right eye.   2.  Zonular laxity, right eye.   PROCEDURE:  Aborted hacoemusification of the right eye.  No intraocular lens was implated.     ULTRASOUND TIME: 1 minutes 0 seconds.  CDE 16.0   SURGEON:  Benay Pillow, MD, MPH  ANESTHESIOLOGIST: Anesthesiologist: Alisa Graff, MD CRNA: Izetta Dakin, CRNA   ANESTHESIA:  Topical with tetracaine drops augmented with 1% preservative-free intracameral lidocaine.  ESTIMATED BLOOD LOSS: less than 1 mL.   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   DESCRIPTION OF PROCEDURE:  The patient was identified in the holding room and transported to the operating room and placed in the supine position under the operating microscope.  The right eye was identified as the operative eye and it was prepped and draped in the usual sterile ophthalmic fashion.   A 1.0 millimeter clear-corneal paracentesis was made at the 10:30 position. 0.5 ml of preservative-free 1% lidocaine with epinephrine was injected into the anterior chamber.  The anterior chamber was filled with Healon 5 viscoelastic.  A 2.4 millimeter keratome was used to make a near-clear corneal incision at the 8:00 position.  A curvilinear capsulorrhexis was made with a cystotome and capsulorrhexis forceps.  Balanced salt solution was used to hydrodissect  the nucleus which partially prolapsed into the anterior chamber.   Phacoemulsification sculpting was performed but a full crack through the dense posterior plate was not achieved.   Nasally, the lens was tilted posteriorly.   Trypan blue was used to stain the capsule.  An attempt to support the lens capsule using 2 iris retractors was made, but this did not provide adequate support.    A right handed capsular tension ring was injected (Morcher  14C) but still the lens remained significantly tilted and the lens-capsule complex was loose.  The lens could not be rotated within the capsule.  A brief attempt was made to further chop the lens using manual techniques but the lens-capsule complex was too loose and unstable.   The decision was made to abandon the procedure at this point and refer to a retina specialist for lensectomy.  Wounds were hydrated with balanced salt solution.  The anterior chamber was inflated to a physiologic pressure with balanced salt solution.   Intracameral vigamox 0.1 mL undiluted was injected into the eye and a drop placed onto the ocular surface.  No wound leaks were noted.  No vitreous at the wound or in the anterior chamber were detected.   The patient was taken to the recovery room in stable condition.    The complication was disclosed to the patient.   Benay Pillow 12/10/2017, 8:53 AM

## 2017-12-10 NOTE — Transfer of Care (Signed)
Immediate Anesthesia Transfer of Care Note  Patient: Benjamin Davidson  Procedure(s) Performed: CATARACT EXTRACTION PHACO AND INTRAOCULAR LENS PLACEMENT (IOC) RIGHT DIABETIC (Right )  Patient Location: PACU  Anesthesia Type: MAC  Level of Consciousness: awake, alert  and patient cooperative  Airway and Oxygen Therapy: Patient Spontanous Breathing and Patient connected to supplemental oxygen  Post-op Assessment: Post-op Vital signs reviewed, Patient's Cardiovascular Status Stable, Respiratory Function Stable, Patent Airway and No signs of Nausea or vomiting  Post-op Vital Signs: Reviewed and stable  Complications: No apparent anesthesia complications

## 2017-12-10 NOTE — Anesthesia Postprocedure Evaluation (Signed)
Anesthesia Post Note  Patient: Benjamin Davidson  Procedure(s) Performed: CATARACT EXTRACTION PHACO AND INTRAOCULAR LENS PLACEMENT (IOC) RIGHT DIABETIC (Right )  Patient location during evaluation: PACU Anesthesia Type: MAC Level of consciousness: awake and alert Pain management: pain level controlled Vital Signs Assessment: post-procedure vital signs reviewed and stable Respiratory status: spontaneous breathing, nonlabored ventilation, respiratory function stable and patient connected to nasal cannula oxygen Cardiovascular status: stable and blood pressure returned to baseline Postop Assessment: no apparent nausea or vomiting Anesthetic complications: no    Alisa Graff

## 2017-12-10 NOTE — Anesthesia Postprocedure Evaluation (Signed)
Anesthesia Post Note  Patient: Benjamin Davidson  Procedure(s) Performed: CATARACT EXTRACTION PHACO AND INTRAOCULAR LENS PLACEMENT (IOC) RIGHT DIABETIC (Right )  Patient location during evaluation: PACU Anesthesia Type: MAC Level of consciousness: awake and alert Pain management: pain level controlled Vital Signs Assessment: post-procedure vital signs reviewed and stable Respiratory status: spontaneous breathing, nonlabored ventilation, respiratory function stable and patient connected to nasal cannula oxygen Cardiovascular status: stable and blood pressure returned to baseline Postop Assessment: no apparent nausea or vomiting Anesthetic complications: no    Rachel B Beach      

## 2017-12-10 NOTE — Anesthesia Procedure Notes (Signed)
Procedure Name: MAC Performed by: Izetta Dakin, CRNA Pre-anesthesia Checklist: Patient identified, Timeout performed, Patient being monitored, Suction available and Emergency Drugs available Patient Re-evaluated:Patient Re-evaluated prior to induction Oxygen Delivery Method: Nasal cannula

## 2017-12-10 NOTE — Anesthesia Preprocedure Evaluation (Signed)
Anesthesia Evaluation  Patient identified by MRN, date of birth, ID band Patient awake    Reviewed: Allergy & Precautions, H&P , NPO status , Patient's Chart, lab work & pertinent test results, reviewed documented beta blocker date and time   Airway Mallampati: II  TM Distance: >3 FB Neck ROM: full    Dental no notable dental hx.    Pulmonary asthma ,    Pulmonary exam normal breath sounds clear to auscultation       Cardiovascular Exercise Tolerance: Good hypertension, + CAD, + Peripheral Vascular Disease and +CHF   Rhythm:regular Rate:Normal     Neuro/Psych PSYCHIATRIC DISORDERS Depression negative neurological ROS     GI/Hepatic negative GI ROS, Neg liver ROS,   Endo/Other  negative endocrine ROSdiabetes  Renal/GU CRFRenal disease  negative genitourinary   Musculoskeletal   Abdominal   Peds  Hematology negative hematology ROS (+)   Anesthesia Other Findings   Reproductive/Obstetrics negative OB ROS                             Anesthesia Physical Anesthesia Plan  ASA: III  Anesthesia Plan: MAC   Post-op Pain Management:    Induction:   PONV Risk Score and Plan:   Airway Management Planned:   Additional Equipment:   Intra-op Plan:   Post-operative Plan:   Informed Consent: I have reviewed the patients History and Physical, chart, labs and discussed the procedure including the risks, benefits and alternatives for the proposed anesthesia with the patient or authorized representative who has indicated his/her understanding and acceptance.   Dental Advisory Given  Plan Discussed with: CRNA  Anesthesia Plan Comments:         Anesthesia Quick Evaluation

## 2017-12-11 ENCOUNTER — Encounter: Payer: Self-pay | Admitting: Ophthalmology

## 2017-12-11 DIAGNOSIS — E113591 Type 2 diabetes mellitus with proliferative diabetic retinopathy without macular edema, right eye: Secondary | ICD-10-CM | POA: Diagnosis not present

## 2017-12-11 DIAGNOSIS — H59021 Cataract (lens) fragments in eye following cataract surgery, right eye: Secondary | ICD-10-CM | POA: Diagnosis not present

## 2017-12-11 DIAGNOSIS — H4311 Vitreous hemorrhage, right eye: Secondary | ICD-10-CM | POA: Diagnosis not present

## 2017-12-13 DIAGNOSIS — H4311 Vitreous hemorrhage, right eye: Secondary | ICD-10-CM | POA: Diagnosis not present

## 2017-12-13 DIAGNOSIS — E113591 Type 2 diabetes mellitus with proliferative diabetic retinopathy without macular edema, right eye: Secondary | ICD-10-CM | POA: Diagnosis not present

## 2017-12-13 DIAGNOSIS — H59021 Cataract (lens) fragments in eye following cataract surgery, right eye: Secondary | ICD-10-CM | POA: Diagnosis not present

## 2017-12-14 DIAGNOSIS — H2701 Aphakia, right eye: Secondary | ICD-10-CM | POA: Diagnosis not present

## 2017-12-14 DIAGNOSIS — H59021 Cataract (lens) fragments in eye following cataract surgery, right eye: Secondary | ICD-10-CM | POA: Diagnosis not present

## 2017-12-21 DIAGNOSIS — H27111 Subluxation of lens, right eye: Secondary | ICD-10-CM | POA: Diagnosis not present

## 2018-01-08 ENCOUNTER — Telehealth: Payer: Self-pay

## 2018-01-08 NOTE — Telephone Encounter (Signed)
I have no idea what is in ketofit See if he can get more information about what is in it (as I might have some concerns with his medications/heart, etc)

## 2018-01-08 NOTE — Telephone Encounter (Signed)
Called pt and he said it was Pure Keto from [employer]. When I got on Google a Scam Alert immediately popped up about the product. I advised him it is best to stay away from it.  He proceeded to tell me he has had a tickle in his throat for a few days and thought it may be allergies as he has no other symptoms. I suggested he get Zyrtec and a spray like Flonase or Nasacort and if there was no improvement in a few days to let us know or come in.

## 2018-01-08 NOTE — Telephone Encounter (Signed)
Pt wants to know if it is OK for pt to take med saw on TV to help lose wt called [facility]. Pt request cb. Pt had annual exam on 07/19/17.

## 2018-01-09 NOTE — Telephone Encounter (Signed)
That sounds appropriate. No further action

## 2018-01-16 DIAGNOSIS — L851 Acquired keratosis [keratoderma] palmaris et plantaris: Secondary | ICD-10-CM | POA: Diagnosis not present

## 2018-01-16 DIAGNOSIS — B351 Tinea unguium: Secondary | ICD-10-CM | POA: Diagnosis not present

## 2018-01-16 DIAGNOSIS — I739 Peripheral vascular disease, unspecified: Secondary | ICD-10-CM | POA: Diagnosis not present

## 2018-01-21 ENCOUNTER — Ambulatory Visit (INDEPENDENT_AMBULATORY_CARE_PROVIDER_SITE_OTHER): Payer: Medicare HMO | Admitting: Internal Medicine

## 2018-01-21 ENCOUNTER — Encounter: Payer: Self-pay | Admitting: Internal Medicine

## 2018-01-21 VITALS — BP 132/84 | HR 73 | Temp 97.4°F | Ht 69.0 in | Wt 247.0 lb

## 2018-01-21 DIAGNOSIS — E1142 Type 2 diabetes mellitus with diabetic polyneuropathy: Secondary | ICD-10-CM | POA: Diagnosis not present

## 2018-01-21 LAB — POCT GLYCOSYLATED HEMOGLOBIN (HGB A1C): Hemoglobin A1C: 6.4 % — AB (ref 4.0–5.6)

## 2018-01-21 LAB — HM DIABETES FOOT EXAM

## 2018-01-21 NOTE — Progress Notes (Signed)
Subjective:    Patient ID: Benjamin Davidson, male    DOB: November 04, 1950, 67 y.o.   MRN: [id number]  HPI Here for follow up of diabetes and other chronic health conditions  Has been getting treatment for his right eye Needs a new lens Vision is okay  Checking sugars daily-- 90's- 150 usually Some hypoglycemic reactions--mild Skips insulin at times if running low No foot problems--amputation sites are fine  Current Outpatient Medications on File Prior to Visit  Medication Sig Dispense Refill  . aspirin EC 81 MG tablet Take 1 tablet (81 mg total) by mouth daily. 150 tablet 2  . carvedilol (COREG) 6.25 MG tablet TAKE 1 TABLET BY MOUTH TWICE A DAY 180 tablet 2  . clopidogrel (PLAVIX) 75 MG tablet TAKE 1 TABLET BY MOUTH EVERY MORNING WITH BREAKFAST 90 tablet 3  . furosemide (LASIX) 20 MG tablet TAKE 1 TABLET BY MOUTH ONCE A DAY. MAY TAKE ONE ADDITIONAL TABLET IF INCREASED SWELLING. 60 tablet 5  . gabapentin (NEURONTIN) 300 MG capsule Take 1 capsule (300 mg total) by mouth 2 (two) times daily. 60 capsule 11  . hydrALAZINE (APRESOLINE) 10 MG tablet Take 10 mg by mouth daily.    . hydrochlorothiazide (HYDRODIURIL) 25 MG tablet Take 25 mg by mouth daily.    . insulin regular (NOVOLIN R,HUMULIN R) 100 units/mL injection Inject into the skin.    Marland Kitchen losartan (COZAAR) 25 MG tablet TAKE 1 TABLET BY MOUTH ONCE DAILY 30 tablet 5  . NOVOLIN 70/30 (70-30) 100 UNIT/ML injection INJECT 35 UNITS SUBCUTANEOUSLY TWICE A DAY AS DIRECTED 20 mL 11  . potassium chloride (K-DUR) 10 MEQ tablet TAKE 1 TABLET BY MOUTH ONCE DAILY 90 tablet 3  . rivaroxaban (XARELTO) 10 MG TABS tablet Take 1 tablet (10 mg total) by mouth daily. 30 tablet 11  . rosuvastatin (CRESTOR) 20 MG tablet TAKE 1 TABLET BY MOUTH ONCE DAILY 90 tablet 3  . sertraline (ZOLOFT) 100 MG tablet Take 100 mg by mouth at bedtime.    Karen Chafe INSULIN SYRINGE 31G X 5/16" 0.5 ML MISC USE AS DIRECTED TWICE DAILY 100 each 7   No current facility-administered  medications on file prior to visit.     Allergies  Allergen Reactions  . Lisinopril     Cough Flu like symptoms     Past Medical History:  Diagnosis Date  . Anemia   . Asthma    childhood  . CHF (congestive heart failure) ([place])   . Coronary artery disease   . DM (diabetes mellitus), type 2, uncontrolled, periph vascular complic ([place])    Type II  . Edema    feet/legs  . Hypertension   . Left leg DVT ([place])   . MDD (recurrent major depressive disorder) in remission ([place])   . Peripheral vascular disease ([facility])    Left toe amputations. Dr Lucky Cowboy  . Pure hypercholesterolemia   . Shortness of breath dyspnea   . Syncope    resolved    Past Surgical History:  Procedure Laterality Date  . AMPUTATION TOE Left 03/17/2017   Procedure: FIRST RAY RESECTION LEFT FOOT;  Surgeon: Sharlotte Alamo, DPM;  Location: [facility];  Service: Podiatry;  Laterality: Left;  . CARDIAC CATHETERIZATION    . CATARACT EXTRACTION W/PHACO Left 06/03/2015   Procedure: CATARACT EXTRACTION PHACO AND INTRAOCULAR LENS PLACEMENT (IOC);  Surgeon: Eulogio Bear, MD;  Location: [facility];  Service: Ophthalmology;  Laterality: Left;  Lot# [id number] H [place]: 1.06 AP%: 5.5 CDE: 3.69  .  CATARACT EXTRACTION W/PHACO Right 12/10/2017   Procedure: Aborted phacoemusification of the right eye.  No intraocular lens was implated.;  Surgeon: Eulogio Bear, MD;  Location: [facility];  Service: Ophthalmology;  Laterality: Right;  Diabetic - insulin  . CORONARY ARTERY BYPASS GRAFT  2008  . EYE SURGERY    . PARS PLANA VITRECTOMY Right 02/17/2015   Procedure: PARS PLANA VITRECTOMY WITH 25 GAUGE, endolaser, right eye;  Surgeon: Milus Height, MD;  Location: [facility];  Service: Ophthalmology;  Laterality: Right;  fluid pack lot # [id number] H endolaser: 200w, 6712W  . PERCUTANEOUS STENT INTERVENTION     right leg  . PERIPHERAL VASCULAR BALLOON ANGIOPLASTY Left 03/20/2017   Procedure: PERIPHERAL VASCULAR BALLOON ANGIOPLASTY;   Surgeon: Katha Cabal, MD;  Location: [facility];  Service: Cardiovascular;  Laterality: Left;  . PERIPHERALLY INSERTED CENTRAL CATHETER INSERTION  2013  . TOE AMPUTATION     left 5th, right 4th and 5th    Family History  Problem Relation Age of Onset  . Heart disease Sister   . Hyperlipidemia Brother     Social History   Socioeconomic History  . Marital status: Divorced    Spouse name: Not on file  . Number of children: 0  . Years of education: Not on file  . Highest education level: Not on file  Occupational History  . Occupation: [name]    Comment: Disabled since 2000's  Social Needs  . Financial resource strain: Not on file  . Food insecurity:    Worry: Not on file    Inability: Not on file  . Transportation needs:    Medical: Not on file    Non-medical: Not on file  Tobacco Use  . Smoking status: Never Smoker  . Smokeless tobacco: Never Used  Substance and Sexual Activity  . Alcohol use: No    Alcohol/week: 0.0 standard drinks  . Drug use: No  . Sexual activity: Not on file  Lifestyle  . Physical activity:    Days per week: Not on file    Minutes per session: Not on file  . Stress: Not on file  Relationships  . Social connections:    Talks on phone: Not on file    Gets together: Not on file    Attends religious service: Not on file    Active member of club or organization: Not on file    Attends meetings of clubs or organizations: Not on file    Relationship status: Not on file  . Intimate partner violence:    Fear of current or ex partner: Not on file    Emotionally abused: Not on file    Physically abused: Not on file    Forced sexual activity: Not on file  Other Topics Concern  . Not on file  Social History Narrative   No living will   Requests [name], sister, as health care POA. May have done official forms   Would accept resuscitation attempts   Not sure about tube feeds   Review of Systems Weight down 12# over the  past 6 months Sleeps fine Walks with cane when out of house--- for balance    Objective:   Physical Exam  Constitutional: He appears well-developed. No distress.  Neck: No thyromegaly present.  Cardiovascular: Normal rate, regular rhythm and normal heart sounds. Exam reveals no gallop.  No murmur heard. Feet warm with very faint pulses  Respiratory: Effort normal and breath sounds normal. No respiratory distress.  He has no wheezes. He has no rales.  Musculoskeletal:        General: No tenderness or edema.  Lymphadenopathy:    He has no cervical adenopathy.  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 His behavior is normal.           Assessment & Plan:

## 2018-01-21 NOTE — Assessment & Plan Note (Signed)
Lab Results  Component Value Date   HGBA1C 6.4 (A) 01/21/2018   Good control No med changes needed Mild hypoglycemia only Retinopathy and vascular complications as well

## 2018-02-08 DIAGNOSIS — T8522XA Displacement of intraocular lens, initial encounter: Secondary | ICD-10-CM | POA: Diagnosis not present

## 2018-02-08 DIAGNOSIS — H2701 Aphakia, right eye: Secondary | ICD-10-CM | POA: Diagnosis not present

## 2018-02-14 ENCOUNTER — Other Ambulatory Visit: Payer: Self-pay | Admitting: Cardiovascular Disease

## 2018-02-25 ENCOUNTER — Other Ambulatory Visit: Payer: Self-pay | Admitting: Internal Medicine

## 2018-02-27 DIAGNOSIS — E119 Type 2 diabetes mellitus without complications: Secondary | ICD-10-CM | POA: Diagnosis not present

## 2018-02-27 DIAGNOSIS — Z888 Allergy status to other drugs, medicaments and biological substances status: Secondary | ICD-10-CM | POA: Diagnosis not present

## 2018-02-27 DIAGNOSIS — H2703 Aphakia, bilateral: Secondary | ICD-10-CM | POA: Diagnosis not present

## 2018-02-27 DIAGNOSIS — Z794 Long term (current) use of insulin: Secondary | ICD-10-CM | POA: Diagnosis not present

## 2018-02-27 DIAGNOSIS — J45909 Unspecified asthma, uncomplicated: Secondary | ICD-10-CM | POA: Diagnosis not present

## 2018-02-27 DIAGNOSIS — Z89411 Acquired absence of right great toe: Secondary | ICD-10-CM | POA: Diagnosis not present

## 2018-02-27 DIAGNOSIS — Z89412 Acquired absence of left great toe: Secondary | ICD-10-CM | POA: Diagnosis not present

## 2018-02-27 DIAGNOSIS — Z9841 Cataract extraction status, right eye: Secondary | ICD-10-CM | POA: Diagnosis not present

## 2018-02-27 DIAGNOSIS — I10 Essential (primary) hypertension: Secondary | ICD-10-CM | POA: Diagnosis not present

## 2018-02-27 DIAGNOSIS — T8522XA Displacement of intraocular lens, initial encounter: Secondary | ICD-10-CM | POA: Diagnosis not present

## 2018-02-27 DIAGNOSIS — X58XXXA Exposure to other specified factors, initial encounter: Secondary | ICD-10-CM | POA: Diagnosis not present

## 2018-02-28 DIAGNOSIS — E1142 Type 2 diabetes mellitus with diabetic polyneuropathy: Secondary | ICD-10-CM | POA: Diagnosis not present

## 2018-04-08 ENCOUNTER — Ambulatory Visit (INDEPENDENT_AMBULATORY_CARE_PROVIDER_SITE_OTHER): Payer: Medicare HMO | Admitting: Vascular Surgery

## 2018-04-08 ENCOUNTER — Encounter (INDEPENDENT_AMBULATORY_CARE_PROVIDER_SITE_OTHER): Payer: Medicare HMO

## 2018-04-17 DIAGNOSIS — I739 Peripheral vascular disease, unspecified: Secondary | ICD-10-CM | POA: Diagnosis not present

## 2018-04-17 DIAGNOSIS — E1140 Type 2 diabetes mellitus with diabetic neuropathy, unspecified: Secondary | ICD-10-CM | POA: Diagnosis not present

## 2018-04-17 DIAGNOSIS — B351 Tinea unguium: Secondary | ICD-10-CM | POA: Diagnosis not present

## 2018-04-17 DIAGNOSIS — Z794 Long term (current) use of insulin: Secondary | ICD-10-CM | POA: Diagnosis not present

## 2018-04-22 ENCOUNTER — Encounter (INDEPENDENT_AMBULATORY_CARE_PROVIDER_SITE_OTHER): Payer: Medicare HMO

## 2018-04-22 ENCOUNTER — Ambulatory Visit (INDEPENDENT_AMBULATORY_CARE_PROVIDER_SITE_OTHER): Payer: Medicare HMO | Admitting: Nurse Practitioner

## 2018-05-06 ENCOUNTER — Other Ambulatory Visit: Payer: Self-pay | Admitting: Cardiovascular Disease

## 2018-05-06 ENCOUNTER — Other Ambulatory Visit: Payer: Self-pay | Admitting: Internal Medicine

## 2018-05-23 ENCOUNTER — Ambulatory Visit (INDEPENDENT_AMBULATORY_CARE_PROVIDER_SITE_OTHER): Payer: Medicare HMO | Admitting: Nurse Practitioner

## 2018-05-23 ENCOUNTER — Encounter (INDEPENDENT_AMBULATORY_CARE_PROVIDER_SITE_OTHER): Payer: Medicare HMO

## 2018-05-25 ENCOUNTER — Other Ambulatory Visit: Payer: Self-pay | Admitting: Cardiovascular Disease

## 2018-06-03 ENCOUNTER — Telehealth: Payer: Self-pay | Admitting: Cardiovascular Disease

## 2018-06-03 ENCOUNTER — Other Ambulatory Visit: Payer: Self-pay | Admitting: Cardiovascular Disease

## 2018-06-03 NOTE — Telephone Encounter (Signed)
Virtual Visit Pre-Appointment Phone Call  "(Name), I am calling you today to discuss your upcoming appointment. We are currently trying to limit exposure to the virus that causes COVID-19 by seeing patients at home rather than in the office."  1. "What is the BEST phone number to call the day of the visit?" - include this in appointment notes  2. Do you have or have access to (through a family member/friend) a smartphone with video capability that we can use for your visit?" a. If yes - list this number in appt notes as cell (if different from BEST phone #) and list the appointment type as a VIDEO visit in appointment notes b. If no - list the appointment type as a PHONE visit in appointment notes  3. Confirm consent - "In the setting of the current Covid19 crisis, you are scheduled for a (phone or video) visit with your provider on (date) at (time).  Just as we do with many in-office visits, in order for you to participate in this visit, we must obtain consent.  If you'd like, I can send this to your mychart (if signed up) or email for you to review.  Otherwise, I can obtain your verbal consent now.  All virtual visits are billed to your insurance company just like a normal visit would be.  By agreeing to a virtual visit, we'd like you to understand that the technology does not allow for your provider to perform an examination, and thus may limit your provider's ability to fully assess your condition. If your provider identifies any concerns that need to be evaluated in person, we will make arrangements to do so.  Finally, though the technology is pretty good, we cannot assure that it will always work on either your or our end, and in the setting of a video visit, we may have to convert it to a phone-only visit.  In either situation, we cannot ensure that we have a secure connection.  Are you willing to proceed?" STAFF: Did the patient verbally acknowledge consent to telehealth visit? Document  YES/NO here: YES  4. Advise patient to be prepared - "Two hours prior to your appointment, go ahead and check your blood pressure, pulse, oxygen saturation, and your weight (if you have the equipment to check those) and write them all down. When your visit starts, your provider will ask you for this information. If you have an Apple Watch or Kardia device, please plan to have heart rate information ready on the day of your appointment. Please have a pen and paper handy nearby the day of the visit as well."  5. Give patient instructions for MyChart download to smartphone OR [email or web address] as below if video visit (depending on what platform provider is using)  6. Inform patient they will receive a phone call 15 minutes prior to their appointment time (may be from unknown caller ID) so they should be prepared to answer    TELEPHONE CALL NOTE  Jaymes Hang Lea has been deemed a candidate for a follow-up tele-health visit to limit community exposure during the Covid-19 pandemic. I spoke with the patient via phone to ensure availability of phone/video source, confirm preferred email & phone number, and discuss instructions and expectations.  I reminded Benjamin Davidson to be prepared with any vital sign and/or heart rhythm information that could potentially be obtained via home monitoring, at the time of his visit. I reminded Nadia Torr Paddack to expect a phone call prior to  his visit.  Clarisse Gouge 06/03/2018 4:17 PM   INSTRUCTIONS FOR DOWNLOADING THE MYCHART APP TO SMARTPHONE  - The patient must first make sure to have activated MyChart and know their login information - If Apple, go to [employer] and type in MyChart in the search bar and download the app. If Android, ask patient to go to [employer] and type in [place] in the search bar and download the app. The app is free but as with any other app downloads, their phone may require them to verify saved payment information or Apple/Android  password.  - The patient will need to then log into the app with their MyChart username and password, and select [facility] as their healthcare provider to link the account. When it is time for your visit, go to the MyChart app, find appointments, and click Begin Video Visit. Be sure to Select Allow for your device to access the Microphone and Camera for your visit. You will then be connected, and your provider will be with you shortly.  **If they have any issues connecting, or need assistance please contact MyChart service desk [phone])**  **If using a computer, in order to ensure the best quality for their visit they will need to use either of the following Internet Browsers: [employer], or Google Chrome**  IF USING DOXIMITY or DOXY.ME - The patient will receive a link just prior to their visit by text.     FULL LENGTH CONSENT FOR TELE-HEALTH VISIT   I hereby voluntarily request, consent and authorize [facility] and its employed or contracted physicians, physician assistants, nurse practitioners or other licensed health care professionals (the Practitioner), to provide me with telemedicine health care services (the Services") as deemed necessary by the treating Practitioner. I acknowledge and consent to receive the Services by the Practitioner via telemedicine. I understand that the telemedicine visit will involve communicating with the Practitioner through live audiovisual communication technology and the disclosure of certain medical information by electronic transmission. I acknowledge that I have been given the opportunity to request an in-person assessment or other available alternative prior to the telemedicine visit and am voluntarily participating in the telemedicine visit.  I understand that I have the right to withhold or withdraw my consent to the use of telemedicine in the course of my care at any time, without affecting my right to future care or treatment,  and that the Practitioner or I may terminate the telemedicine visit at any time. I understand that I have the right to inspect all information obtained and/or recorded in the course of the telemedicine visit and may receive copies of available information for a reasonable fee.  I understand that some of the potential risks of receiving the Services via telemedicine include:   Delay or interruption in medical evaluation due to technological equipment failure or disruption;  Information transmitted may not be sufficient (e.g. poor resolution of images) to allow for appropriate medical decision making by the Practitioner; and/or   In rare instances, security protocols could fail, causing a breach of personal health information.  Furthermore, I acknowledge that it is my responsibility to provide information about my medical history, conditions and care that is complete and accurate to the best of my ability. I acknowledge that Practitioner's advice, recommendations, and/or decision may be based on factors not within their control, such as incomplete or inaccurate data provided by me or distortions of diagnostic images or specimens that may result from electronic transmissions. I  understand that the practice of medicine is not an exact science and that Practitioner makes no warranties or guarantees regarding treatment outcomes. I acknowledge that I will receive a copy of this consent concurrently upon execution via email to the email address I last provided but may also request a printed copy by calling the office of [facility].    I understand that my insurance will be billed for this visit.   I have read or had this consent read to me.  I understand the contents of this consent, which adequately explains the benefits and risks of the Services being provided via telemedicine.   I have been provided ample opportunity to ask questions regarding this consent and the Services and have had my questions  answered to my satisfaction.  I give my informed consent for the services to be provided through the use of telemedicine in my medical care  By participating in this telemedicine visit I agree to the above.

## 2018-06-05 ENCOUNTER — Other Ambulatory Visit (INDEPENDENT_AMBULATORY_CARE_PROVIDER_SITE_OTHER): Payer: Self-pay | Admitting: Vascular Surgery

## 2018-06-05 ENCOUNTER — Telehealth (INDEPENDENT_AMBULATORY_CARE_PROVIDER_SITE_OTHER): Payer: Medicare HMO | Admitting: Cardiovascular Disease

## 2018-06-05 ENCOUNTER — Other Ambulatory Visit: Payer: Self-pay

## 2018-06-05 DIAGNOSIS — I25118 Atherosclerotic heart disease of native coronary artery with other forms of angina pectoris: Secondary | ICD-10-CM | POA: Diagnosis not present

## 2018-06-05 DIAGNOSIS — E7800 Pure hypercholesterolemia, unspecified: Secondary | ICD-10-CM | POA: Diagnosis not present

## 2018-06-05 DIAGNOSIS — I739 Peripheral vascular disease, unspecified: Secondary | ICD-10-CM | POA: Diagnosis not present

## 2018-06-05 DIAGNOSIS — I10 Essential (primary) hypertension: Secondary | ICD-10-CM | POA: Diagnosis not present

## 2018-06-05 DIAGNOSIS — E1142 Type 2 diabetes mellitus with diabetic polyneuropathy: Secondary | ICD-10-CM

## 2018-06-05 MED ORDER — CARVEDILOL 6.25 MG PO TABS: 6.2500 mg | ORAL_TABLET | Freq: Two times a day (BID) | ORAL | 3 refills | Status: DC

## 2018-06-05 MED ORDER — LOSARTAN POTASSIUM 25 MG PO TABS: 25.0000 mg | ORAL_TABLET | Freq: Every day | ORAL | 3 refills | Status: DC

## 2018-06-05 MED ORDER — POTASSIUM CHLORIDE ER 10 MEQ PO TBCR: 10.0000 meq | EXTENDED_RELEASE_TABLET | Freq: Every day | ORAL | 3 refills | Status: DC

## 2018-06-05 MED ORDER — ROSUVASTATIN CALCIUM 20 MG PO TABS: 20.0000 mg | ORAL_TABLET | Freq: Every day | ORAL | 3 refills | Status: DC

## 2018-06-05 MED ORDER — FUROSEMIDE 20 MG PO TABS: ORAL_TABLET | ORAL | 3 refills | Status: DC

## 2018-06-05 NOTE — Progress Notes (Signed)
Virtual Visit via Video Note   This visit type was conducted due to national recommendations for restrictions regarding the COVID-19 Pandemic (e.g. social distancing) in an effort to limit this patient's exposure and mitigate transmission in our community.  Due to his co-morbid illnesses, this patient is at least at moderate risk for complications without adequate follow up.  This format is felt to be most appropriate for this patient at this time.  All issues noted in this document were discussed and addressed.  A limited physical exam was performed with this format.  Please refer to the patient's chart for his consent to telehealth for [facility].   I connected with  Benjamin Davidson on 06/05/18 by a video enabled telemedicine application and verified that I am speaking with the correct person using two identifiers. I discussed the limitations of evaluation and management by telemedicine. The patient expressed understanding and agreed to proceed.   Evaluation Performed:  Follow-up visit  Date:  06/05/2018   ID:  Benjamin Davidson, DOB April 18, 1950, MRN [id number]  Patient Location:  [address] [place] [address]   Provider location:   [facility], [place] office  PCP:  Venia Carbon, MD  Cardiologist:  Patsy Baltimore   Chief Complaint:  Eye problems   History of Present Illness:    Benjamin Davidson is a 68 y.o. male who presents via audio/video conferencing for a telehealth visit today.   The patient does not symptoms concerning for COVID-19 infection (fever, chills, cough, or new SHORTNESS OF BREATH).   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,  poorly controlled diabetes,  sedentary lifestyle,  history of DVT in the left leg, on chronic warfarin  who presents for routine followup of his coronary artery disease, peripheral arterial disease  Foot has healed, No longer needs a  boot  Eating more, during virus quarantine Sweet potato pie Does not think his sugars will be as good  Is not getting out, no place to go Sedentary, weight likely trending upwards Long history of shortness of breath with exertion No significant change in symptoms  Previous lab work reviewed with him in detail Prior numbers well controlled Hemoglobin A1c 6.4 Creatinine 1.17 in June 2019 LDL 60 in January 2019  Had several eye surgeries Dr. Edison Pace started hydralazine 10 daily November 2019 Has run out of the prescription  Take insulin 70/30 takes twice a day  Previous records reviewed  hospital admission February 2019 Left great toe osteomyelitis with ulceration Amputation left great toe,  blister that never healed  Vascular surgery March 20, 2017 Angioplasty of left posterior tibial Angioplasty left peroneal artery  History of osteomyelitis requiring wound VAC on his right foot, 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 He had gangrene of the fifth toe on the right with acute renal failure and dehydration admitted 08/07/2011    Prior CV studies:   The following studies were reviewed today:  No recent stress test No recent catheterization  last stress test July 2008 showing mild ischemia in the mid anteroseptal, apical anterior and apical septal region.   Ejection fraction 40%. This test was done post bypass. No cardiac catheterization was done in followup as he had no symptoms.  Past Medical History:  Diagnosis Date  . Anemia   . Asthma    childhood  . CHF (congestive heart failure) ([place])   .  Coronary artery disease   . DM (diabetes mellitus), type 2, uncontrolled, periph vascular complic ([place])    Type II  . Edema    feet/legs  . Hypertension   . Left leg DVT ([place])   . MDD (recurrent major depressive disorder) in remission ([place])   . Peripheral vascular disease (HCC)    Left toe amputations. Dr Lucky Cowboy  . Pure hypercholesterolemia    . Shortness of breath dyspnea   . Syncope    resolved   Past Surgical History:  Procedure Laterality Date  . AMPUTATION TOE Left 03/17/2017   Procedure: FIRST RAY RESECTION LEFT FOOT;  Surgeon: Sharlotte Alamo, DPM;  Location: [facility];  Service: Podiatry;  Laterality: Left;  . CARDIAC CATHETERIZATION    . CATARACT EXTRACTION W/PHACO Left 06/03/2015   Procedure: CATARACT EXTRACTION PHACO AND INTRAOCULAR LENS PLACEMENT (IOC);  Surgeon: Eulogio Bear, MD;  Location: [facility];  Service: Ophthalmology;  Laterality: Left;  Lot# [id number] H [place]: 1.06 AP%: 5.5 CDE: 3.69  . CATARACT EXTRACTION W/PHACO Right 12/10/2017   Procedure: Aborted phacoemusification of the right eye.  No intraocular lens was implated.;  Surgeon: Eulogio Bear, MD;  Location: [place];  Service: Ophthalmology;  Laterality: Right;  Diabetic - insulin  . CORONARY ARTERY BYPASS GRAFT  2008  . EYE SURGERY    . PARS PLANA VITRECTOMY Right 02/17/2015   Procedure: PARS PLANA VITRECTOMY WITH 25 GAUGE, endolaser, right eye;  Surgeon: Milus Height, MD;  Location: [facility];  Service: Ophthalmology;  Laterality: Right;  fluid pack lot # [id number] H endolaser: 200w, [id number]  . PERCUTANEOUS STENT INTERVENTION     right leg  . PERIPHERAL VASCULAR BALLOON ANGIOPLASTY Left 03/20/2017   Procedure: PERIPHERAL VASCULAR BALLOON ANGIOPLASTY;  Surgeon: Katha Cabal, MD;  Location: [place] [facility];  Service: Cardiovascular;  Laterality: Left;  . PERIPHERALLY INSERTED CENTRAL CATHETER INSERTION  2013  . TOE AMPUTATION     left 5th, right 4th and 5th     Current Meds  Medication Sig  . aspirin EC 81 MG tablet Take 1 tablet (81 mg total) by mouth daily.  . carvedilol (COREG) 6.25 MG tablet TAKE 1 TABLET BY MOUTH TWICE (2) DAILY  . clopidogrel (PLAVIX) 75 MG tablet TAKE 1 TABLET BY MOUTH EVERY MORNING WITH BREAKFAST  . furosemide (LASIX) 20 MG tablet TAKE 1 TABLET BY MOUTH ONCE A DAY. MAY TAKE ONE ADDITIONAL TABLET IF  INCREASED SWELLING.  . gabapentin (NEURONTIN) 300 MG capsule Take 1 capsule (300 mg total) by mouth 2 (two) times daily.  . hydrALAZINE (APRESOLINE) 10 MG tablet Take 10 mg by mouth daily.  . hydrochlorothiazide (HYDRODIURIL) 25 MG tablet Take 25 mg by mouth daily.  . insulin regular (NOVOLIN R,HUMULIN R) 100 units/mL injection Inject into the skin.  Marland Kitchen losartan (COZAAR) 25 MG tablet Take 1 tablet (25 mg total) by mouth daily. Pt needs to make appt before receiving anymore refills  . NOVOLIN 70/30 (70-30) 100 UNIT/ML injection INJECT 35 UNITS SUBCUTANEOUSLY TWICE A DAY AS DIRECTED  . potassium chloride (K-DUR) 10 MEQ tablet TAKE 1 TABLET BY MOUTH ONCE DAILY  . rivaroxaban (XARELTO) 10 MG TABS tablet Take 1 tablet (10 mg total) by mouth daily.  . rosuvastatin (CRESTOR) 20 MG tablet TAKE 1 TABLET BY MOUTH ONCE A DAY  . sertraline (ZOLOFT) 100 MG tablet TAKE 1 TABLET BY MOUTH ONCE DAILY  . TRUEPLUS INSULIN SYRINGE 31G X 5/16" 0.5 ML MISC USE AS DIRECTED TWICE DAILY  Allergies:   Lisinopril   Social History   Tobacco Use  . Smoking status: Never Smoker  . Smokeless tobacco: Never Used  Substance Use Topics  . Alcohol use: No    Alcohol/week: 0.0 standard drinks  . Drug use: No     Current Outpatient Medications on File Prior to Visit  Medication Sig Dispense Refill  . aspirin EC 81 MG tablet Take 1 tablet (81 mg total) by mouth daily. 150 tablet 2  . carvedilol (COREG) 6.25 MG tablet TAKE 1 TABLET BY MOUTH TWICE (2) DAILY 180 tablet 0  . clopidogrel (PLAVIX) 75 MG tablet TAKE 1 TABLET BY MOUTH EVERY MORNING WITH BREAKFAST 90 tablet 3  . furosemide (LASIX) 20 MG tablet TAKE 1 TABLET BY MOUTH ONCE A DAY. MAY TAKE ONE ADDITIONAL TABLET IF INCREASED SWELLING. 60 tablet 5  . gabapentin (NEURONTIN) 300 MG capsule Take 1 capsule (300 mg total) by mouth 2 (two) times daily. 60 capsule 11  . hydrALAZINE (APRESOLINE) 10 MG tablet Take 10 mg by mouth daily.    . hydrochlorothiazide  (HYDRODIURIL) 25 MG tablet Take 25 mg by mouth daily.    . insulin regular (NOVOLIN R,HUMULIN R) 100 units/mL injection Inject into the skin.    Marland Kitchen losartan (COZAAR) 25 MG tablet Take 1 tablet (25 mg total) by mouth daily. Pt needs to make appt before receiving anymore refills 90 tablet 0  . NOVOLIN 70/30 (70-30) 100 UNIT/ML injection INJECT 35 UNITS SUBCUTANEOUSLY TWICE A DAY AS DIRECTED 20 mL 11  . potassium chloride (K-DUR) 10 MEQ tablet TAKE 1 TABLET BY MOUTH ONCE DAILY 90 tablet 3  . rivaroxaban (XARELTO) 10 MG TABS tablet Take 1 tablet (10 mg total) by mouth daily. 30 tablet 11  . rosuvastatin (CRESTOR) 20 MG tablet TAKE 1 TABLET BY MOUTH ONCE A DAY 30 tablet 0  . sertraline (ZOLOFT) 100 MG tablet TAKE 1 TABLET BY MOUTH ONCE DAILY 90 tablet 3  . TRUEPLUS INSULIN SYRINGE 31G X 5/16" 0.5 ML MISC USE AS DIRECTED TWICE DAILY 100 each 7   No current facility-administered medications on file prior to visit.      Family Hx: The patient's family history includes Heart disease in his sister; Hyperlipidemia in his brother.  ROS:   Please see the history of present illness.    Review of Systems  Constitutional: Negative.   Respiratory: Negative.   Cardiovascular: Negative.   Gastrointestinal: Negative.   Musculoskeletal: Negative.   Neurological: Negative.   Psychiatric/Behavioral: Negative.   All other systems reviewed and are negative.    Labs/Other Tests and Data Reviewed:    Recent Labs: 07/19/2017: BUN 18; Creatinine, Ser 1.17; Potassium 4.6; Sodium 142   Recent Lipid Panel Lab Results  Component Value Date/Time   CHOL 112 02/06/2017 10:29 AM   TRIG 88.0 02/06/2017 10:29 AM   HDL 34.40 (L) 02/06/2017 10:29 AM   CHOLHDL 3 02/06/2017 10:29 AM   LDLCALC 60 02/06/2017 10:29 AM    Wt Readings from Last 3 Encounters:  01/21/18 247 lb (112 kg)  12/10/17 249 lb (112.9 kg)  10/04/17 256 lb (116.1 kg)     Exam:    Vital Signs: Vital signs may also be detailed in the HPI There  were no vitals taken for this visit.  Wt Readings from Last 3 Encounters:  01/21/18 247 lb (112 kg)  12/10/17 249 lb (112.9 kg)  10/04/17 256 lb (116.1 kg)   Temp Readings from Last 3 Encounters:  01/21/18 (!) 97.4  F (36.3 C) (Oral)  12/10/17 (!) 97.3 F (36.3 C)  07/19/17 97.6 F (36.4 C) (Oral)   BP Readings from Last 3 Encounters:  01/21/18 132/84  12/10/17 106/63  10/04/17 (!) 147/79   Pulse Readings from Last 3 Encounters:  01/21/18 73  12/10/17 65  10/04/17 73    130/84 Pulse 70  resp 16  Well nourished, well developed male in no acute distress. Constitutional:  oriented to person, place, and time. No distress.     ASSESSMENT & PLAN:    PAD (peripheral artery disease) ([place]) Followed  By vascular, dr. Ronalee Belts Denies any claudication symptoms Stressed importance of aggressive diabetes and cholesterol control  Morbid obesity ([place]) We have encouraged continued exercise, careful diet management in an effort to lose weight.  Eating more over the past several weeks  Type 2 diabetes, controlled, with peripheral neuropathy (HCC) Eating poorly, lots of sweet potato pie HBA1C in 6 range 6 months ago  Pure hypercholesterolemia Cholesterol is at goal on the current lipid regimen. No changes to the medications were made.  Essential hypertension He is going to buy a BP cuff He will check the measurements Unable to adjust his medications  Atherosclerosis of native coronary artery of native heart with stable angina pectoris (HCC) Currently with no symptoms of angina. No further workup at this time. Continue current medication regimen. Continued risk factor modification discussed with him   COVID-19 Education: The signs and symptoms of COVID-19 were discussed with the patient and how to seek care for testing (follow up with PCP or arrange E-visit).  The importance of social distancing was discussed today.  Patient Risk:   After full review of this patients  clinical status, I feel that they are at least moderate risk at this time.  Time:   Today, I have spent 25 minutes with the patient with telehealth technology discussing the cardiac and medical problems/diagnoses detailed above   10 min spent reviewing the chart prior to patient visit today   Medication Adjustments/Labs and Tests Ordered: Current medicines are reviewed at length with the patient today.  Concerns regarding medicines are outlined above.   Tests Ordered: No tests ordered   Medication Changes: No changes made   Disposition: Follow-up in 12 months   Signed, Ida Rogue, MD  06/05/2018 3:16 PM    [facility] [address], [place], [place] [address]

## 2018-06-05 NOTE — Patient Instructions (Addendum)
Medication Instructions:  We will refilled your cardiac meds, 90 days, refills x3  If you need a refill on your cardiac medications before your next appointment, please call your pharmacy.    Lab work: No new labs needed   If you have labs (blood work) drawn today and your tests are completely normal, you will receive your results only by: Marland Kitchen MyChart Message (if you have MyChart) OR . A paper copy in the mail If you have any lab test that is abnormal or we need to change your treatment, we will call you to review the results.   Testing/Procedures: No new testing needed   Follow-Up: At [facility], you and your health needs are our priority.  As part of our continuing mission to provide you with exceptional heart care, we have created designated Provider Care Teams.  These Care Teams include your primary Cardiologist (physician) and Advanced Practice Providers (APPs -  Physician Assistants and Nurse Practitioners) who all work together to provide you with the care you need, when you need it.  . You will need a follow up appointment in 12 months .   Please call our office 2 months in advance to schedule this appointment.    . Providers on your designated Care Team:   . Murray Hodgkins, NP . Christell Faith, PA-C . Marrianne Mood, PA-C  Any Other Special Instructions Will Be Listed Below (If Applicable).  For educational health videos Log in to : [email or web address] Or : [email or web address], password : [email or web address]

## 2018-06-20 DIAGNOSIS — H35031 Hypertensive retinopathy, right eye: Secondary | ICD-10-CM | POA: Diagnosis not present

## 2018-06-20 DIAGNOSIS — H4312 Vitreous hemorrhage, left eye: Secondary | ICD-10-CM | POA: Diagnosis not present

## 2018-06-20 DIAGNOSIS — E113511 Type 2 diabetes mellitus with proliferative diabetic retinopathy with macular edema, right eye: Secondary | ICD-10-CM | POA: Diagnosis not present

## 2018-06-20 DIAGNOSIS — E113592 Type 2 diabetes mellitus with proliferative diabetic retinopathy without macular edema, left eye: Secondary | ICD-10-CM | POA: Diagnosis not present

## 2018-06-26 ENCOUNTER — Encounter (INDEPENDENT_AMBULATORY_CARE_PROVIDER_SITE_OTHER): Payer: Medicare HMO

## 2018-06-26 ENCOUNTER — Ambulatory Visit (INDEPENDENT_AMBULATORY_CARE_PROVIDER_SITE_OTHER): Payer: Medicare HMO | Admitting: Nurse Practitioner

## 2018-07-02 ENCOUNTER — Encounter (INDEPENDENT_AMBULATORY_CARE_PROVIDER_SITE_OTHER): Payer: Self-pay

## 2018-07-02 ENCOUNTER — Other Ambulatory Visit (INDEPENDENT_AMBULATORY_CARE_PROVIDER_SITE_OTHER): Payer: Self-pay | Admitting: Nurse Practitioner

## 2018-07-02 ENCOUNTER — Other Ambulatory Visit: Payer: Self-pay

## 2018-07-02 ENCOUNTER — Ambulatory Visit (INDEPENDENT_AMBULATORY_CARE_PROVIDER_SITE_OTHER): Payer: Medicare HMO | Admitting: Nurse Practitioner

## 2018-07-02 ENCOUNTER — Ambulatory Visit (INDEPENDENT_AMBULATORY_CARE_PROVIDER_SITE_OTHER): Payer: Medicare HMO

## 2018-07-02 DIAGNOSIS — I739 Peripheral vascular disease, unspecified: Secondary | ICD-10-CM | POA: Diagnosis not present

## 2018-07-02 DIAGNOSIS — Z9862 Peripheral vascular angioplasty status: Secondary | ICD-10-CM

## 2018-07-05 ENCOUNTER — Encounter (INDEPENDENT_AMBULATORY_CARE_PROVIDER_SITE_OTHER): Payer: Self-pay | Admitting: Vascular Surgery

## 2018-07-18 ENCOUNTER — Other Ambulatory Visit: Payer: Self-pay | Admitting: Internal Medicine

## 2018-07-18 DIAGNOSIS — E113592 Type 2 diabetes mellitus with proliferative diabetic retinopathy without macular edema, left eye: Secondary | ICD-10-CM | POA: Diagnosis not present

## 2018-07-18 DIAGNOSIS — E113511 Type 2 diabetes mellitus with proliferative diabetic retinopathy with macular edema, right eye: Secondary | ICD-10-CM | POA: Diagnosis not present

## 2018-07-18 DIAGNOSIS — Z794 Long term (current) use of insulin: Secondary | ICD-10-CM | POA: Diagnosis not present

## 2018-07-18 DIAGNOSIS — B351 Tinea unguium: Secondary | ICD-10-CM | POA: Diagnosis not present

## 2018-07-18 DIAGNOSIS — E1140 Type 2 diabetes mellitus with diabetic neuropathy, unspecified: Secondary | ICD-10-CM | POA: Diagnosis not present

## 2018-07-24 ENCOUNTER — Other Ambulatory Visit: Payer: Self-pay | Admitting: Internal Medicine

## 2018-08-05 ENCOUNTER — Ambulatory Visit (INDEPENDENT_AMBULATORY_CARE_PROVIDER_SITE_OTHER): Payer: Medicare HMO | Admitting: Internal Medicine

## 2018-08-05 ENCOUNTER — Other Ambulatory Visit: Payer: Self-pay

## 2018-08-05 ENCOUNTER — Encounter: Payer: Self-pay | Admitting: Internal Medicine

## 2018-08-05 VITALS — BP 132/78 | HR 73 | Temp 98.3°F | Ht 69.25 in | Wt 260.0 lb

## 2018-08-05 DIAGNOSIS — E1122 Type 2 diabetes mellitus with diabetic chronic kidney disease: Secondary | ICD-10-CM | POA: Diagnosis not present

## 2018-08-05 DIAGNOSIS — E083513 Diabetes mellitus due to underlying condition with proliferative diabetic retinopathy with macular edema, bilateral: Secondary | ICD-10-CM | POA: Diagnosis not present

## 2018-08-05 DIAGNOSIS — Z0000 Encounter for general adult medical examination without abnormal findings: Secondary | ICD-10-CM | POA: Diagnosis not present

## 2018-08-05 DIAGNOSIS — I82409 Acute embolism and thrombosis of unspecified deep veins of unspecified lower extremity: Secondary | ICD-10-CM | POA: Diagnosis not present

## 2018-08-05 DIAGNOSIS — S98132A Complete traumatic amputation of one left lesser toe, initial encounter: Secondary | ICD-10-CM | POA: Diagnosis not present

## 2018-08-05 DIAGNOSIS — E1142 Type 2 diabetes mellitus with diabetic polyneuropathy: Secondary | ICD-10-CM

## 2018-08-05 DIAGNOSIS — I739 Peripheral vascular disease, unspecified: Secondary | ICD-10-CM | POA: Diagnosis not present

## 2018-08-05 DIAGNOSIS — F3340 Major depressive disorder, recurrent, in remission, unspecified: Secondary | ICD-10-CM

## 2018-08-05 DIAGNOSIS — R69 Illness, unspecified: Secondary | ICD-10-CM | POA: Diagnosis not present

## 2018-08-05 DIAGNOSIS — N183 Chronic kidney disease, stage 3 unspecified: Secondary | ICD-10-CM

## 2018-08-05 DIAGNOSIS — Z7189 Other specified counseling: Secondary | ICD-10-CM

## 2018-08-05 DIAGNOSIS — Z1211 Encounter for screening for malignant neoplasm of colon: Secondary | ICD-10-CM

## 2018-08-05 DIAGNOSIS — I10 Essential (primary) hypertension: Secondary | ICD-10-CM

## 2018-08-05 DIAGNOSIS — S98131A Complete traumatic amputation of one right lesser toe, initial encounter: Secondary | ICD-10-CM

## 2018-08-05 MED ORDER — TRIAMCINOLONE ACETONIDE 0.1 % EX CREA: 1.0000 "application " | TOPICAL_CREAM | Freq: Two times a day (BID) | CUTANEOUS | 1 refills | Status: AC | PRN

## 2018-08-05 NOTE — Assessment & Plan Note (Signed)
BMI 38 with vascular disease, CAD, HTN, DM Discussed working for gradual weight loss

## 2018-08-05 NOTE — Assessment & Plan Note (Signed)
Is on ARB Will check labs including PTH

## 2018-08-05 NOTE — Assessment & Plan Note (Signed)
Has variable control Will check A1c No sig foot pain now

## 2018-08-05 NOTE — Assessment & Plan Note (Signed)
In remission On sertraline

## 2018-08-05 NOTE — Assessment & Plan Note (Signed)
BP Readings from Last 3 Encounters:  08/05/18 132/78  01/21/18 132/84  12/10/17 106/63   Good control

## 2018-08-05 NOTE — Assessment & Plan Note (Signed)
Having injections now

## 2018-08-05 NOTE — Assessment & Plan Note (Signed)
Sites look clean

## 2018-08-05 NOTE — Assessment & Plan Note (Signed)
See social history 

## 2018-08-05 NOTE — Assessment & Plan Note (Signed)
On xarelto No evidence of recurrence

## 2018-08-05 NOTE — Progress Notes (Signed)
Hearing Screening   Method: Audiometry   125Hz  250Hz  500Hz  1000Hz  2000Hz  3000Hz  4000Hz  6000Hz  8000Hz   Right ear:   20 20 20  20     Left ear:   20 20 20  20     Vision Screening Comments: Seeing specialist for cataract

## 2018-08-05 NOTE — Assessment & Plan Note (Signed)
I have personally reviewed the Medicare Annual Wellness questionnaire and have noted  1. The patient's medical and social history  2. Their use of alcohol, tobacco or illicit drugs  3. Their current medications and supplements  4. The patient's functional ability including ADL's, fall risks, home safety risks and hearing or visual              impairment.  5. Diet and physical activities  6. Evidence for depression or mood disorders  The patients weight, height, BMI and visual acuity have been recorded in the chart  I have made referrals, counseling and provided education to the patient based review of the above and I have provided the pt with a written personalized care plan for preventive services.   I have provided you with a copy of your personalized plan for preventive services. Please take the time to review along with your updated medication list.  Defer PSA till next year at least Has not done the FIT --discussed. He claims he will do it this year Flu vaccine Discussed dietary restraint and trying to do some exercise

## 2018-08-05 NOTE — Assessment & Plan Note (Signed)
Recent vascular studies fine and palpable pulses

## 2018-08-05 NOTE — Progress Notes (Signed)
Subjective:    Patient ID: Benjamin Davidson, male    DOB: Apr 01, 1950, 68 y.o.   MRN: [id number]  HPI Here for Medicare wellness visit and follow up of chronic health conditions Reviewed form and advanced directives Reviewed other doctors No alcohol or tobacco Not really exercising still---discussed again No falls Chronic depression--doing okay now Some vision impairment with his retinopathy but better lately--still can drive Hearing is okay Independent with instrumental ADLs No apparent memory problems  Has had rash on the back of his neck for about 3 weeks Itchy No known exposures Tried teatree and coconut oils---some help for a bump he noticed there (but not for the rash)  Not that compliant with diet Checks sugars--usually under 130 fasting but can be up over 200 if not eating right No low sugar reactions Now getting shots in his eyes for retinopathy--Dr Derrel Nip  No recurrence of DVT that he can tell No leg swelling or tenderness No chest pain No SOB Has chronic DOE with minimal exertion---even just carrying groceries from the car No dizziness or syncope Mild stable edema  Recent vascular studies Looked okay No claudication  Mood has been okay No regular depression Not anhedonic Goes to the store only--- wears mask (friend brings him)  Current Outpatient Medications on File Prior to Visit  Medication Sig Dispense Refill  . aspirin EC 81 MG tablet Take 1 tablet (81 mg total) by mouth daily. 150 tablet 2  . carvedilol (COREG) 6.25 MG tablet Take 1 tablet (6.25 mg total) by mouth 2 (two) times daily with a meal. 180 tablet 3  . clopidogrel (PLAVIX) 75 MG tablet TAKE 1 TABLET BY MOUTH EVERY MORNING WITH BREAKFAST 90 tablet 3  . furosemide (LASIX) 20 MG tablet TAKE 1 TABLET BY MOUTH ONCE A DAY. MAY TAKE ONE ADDITIONAL TABLET IF INCREASED SWELLING. 180 tablet 3  . insulin regular (NOVOLIN R,HUMULIN R) 100 units/mL injection Inject into the skin.    Marland Kitchen losartan (COZAAR)  25 MG tablet Take 1 tablet (25 mg total) by mouth daily. 90 tablet 3  . NOVOLIN 70/30 (70-30) 100 UNIT/ML injection INJECT 35 UNITS SUBCUTANEOUSLY TWICE A DAY AS DIRECTED 20 mL 11  . potassium chloride (K-DUR) 10 MEQ tablet Take 1 tablet (10 mEq total) by mouth daily. 90 tablet 3  . rosuvastatin (CRESTOR) 20 MG tablet Take 1 tablet (20 mg total) by mouth daily. 90 tablet 3  . sertraline (ZOLOFT) 100 MG tablet TAKE 1 TABLET BY MOUTH ONCE DAILY 90 tablet 3  . TRUEPLUS INSULIN SYRINGE 31G X 5/16" 0.5 ML MISC USE AS DIRECTED TWICE DAILY 100 each 7  . XARELTO 10 MG TABS tablet TAKE 1 TABLET BY MOUTH ONCE A DAY 30 tablet 11   No current facility-administered medications on file prior to visit.     Allergies  Allergen Reactions  . Lisinopril     Cough Flu like symptoms     Past Medical History:  Diagnosis Date  . Anemia   . Asthma    childhood  . CHF (congestive heart failure) ([place])   . Coronary artery disease   . DM (diabetes mellitus), type 2, uncontrolled, periph vascular complic ([place])    Type II  . Edema    feet/legs  . Hypertension   . Left leg DVT ([place])   . MDD (recurrent major depressive disorder) in remission ([place])   . Peripheral vascular disease (HCC)    Left toe amputations. Dr Lucky Cowboy  . Pure hypercholesterolemia   .  Shortness of breath dyspnea   . Syncope    resolved    Past Surgical History:  Procedure Laterality Date  . AMPUTATION TOE Left 03/17/2017   Procedure: FIRST RAY RESECTION LEFT FOOT;  Surgeon: Sharlotte Alamo, DPM;  Location: [facility];  Service: Podiatry;  Laterality: Left;  . CARDIAC CATHETERIZATION    . CATARACT EXTRACTION W/PHACO Left 06/03/2015   Procedure: CATARACT EXTRACTION PHACO AND INTRAOCULAR LENS PLACEMENT (IOC);  Surgeon: Eulogio Bear, MD;  Location: [facility];  Service: Ophthalmology;  Laterality: Left;  Lot# [id number] H [place]: 1.06 AP%: 5.5 CDE: 3.69  . CATARACT EXTRACTION W/PHACO Right 12/10/2017   Procedure: Aborted phacoemusification of the  right eye.  No intraocular lens was implated.;  Surgeon: Eulogio Bear, MD;  Location: [place];  Service: Ophthalmology;  Laterality: Right;  Diabetic - insulin  . CORONARY ARTERY BYPASS GRAFT  2008  . EYE SURGERY    . PARS PLANA VITRECTOMY Right 02/17/2015   Procedure: PARS PLANA VITRECTOMY WITH 25 GAUGE, endolaser, right eye;  Surgeon: Milus Height, MD;  Location: [facility];  Service: Ophthalmology;  Laterality: Right;  fluid pack lot # [id number] H endolaser: 200w, [id number]  . PERCUTANEOUS STENT INTERVENTION     right leg  . PERIPHERAL VASCULAR BALLOON ANGIOPLASTY Left 03/20/2017   Procedure: PERIPHERAL VASCULAR BALLOON ANGIOPLASTY;  Surgeon: Katha Cabal, MD;  Location: [place] [facility];  Service: Cardiovascular;  Laterality: Left;  . PERIPHERALLY INSERTED CENTRAL CATHETER INSERTION  2013  . TOE AMPUTATION     left 5th, right 4th and 5th    Family History  Problem Relation Age of Onset  . Heart disease Sister   . Hyperlipidemia Brother     Social History   Socioeconomic History  . Marital status: Divorced    Spouse name: Not on file  . Number of children: 0  . Years of education: Not on file  . Highest education level: Not on file  Occupational History  . Occupation: [name]    Comment: Disabled since 2000's  Social Needs  . Financial resource strain: Not on file  . Food insecurity    Worry: Not on file    Inability: Not on file  . Transportation needs    Medical: Not on file    Non-medical: Not on file  Tobacco Use  . Smoking status: Never Smoker  . Smokeless tobacco: Never Used  Substance and Sexual Activity  . Alcohol use: No    Alcohol/week: 0.0 standard drinks  . Drug use: No  . Sexual activity: Not on file  Lifestyle  . Physical activity    Days per week: Not on file    Minutes per session: Not on file  . Stress: Not on file  Relationships  . Social Herbalist on phone: Not on file    Gets together: Not on file     Attends religious service: Not on file    Active member of club or organization: Not on file    Attends meetings of clubs or organizations: Not on file    Relationship status: Not on file  . Intimate partner violence    Fear of current or ex partner: Not on file    Emotionally abused: Not on file    Physically abused: Not on file    Forced sexual activity: Not on file  Other Topics Concern  . Not on file  Social History Narrative   No living will   Requests [name],  sister, as health care POA. May have done official forms   Would accept resuscitation attempts   Not sure about tube feeds   Review of Systems Teeth okay--hasn't kept up with dentist Appetite is fine Weight is back up again Sleeps okay Wears seat belt Bowels okay--no blood Voids fine. Nocturia up to 2-3 (variable though--can sleep through at times) No heartburn or dysphagia No skin lesions ---just the rash on neck    Objective:   Physical Exam  Constitutional: He is oriented to person, place, and time. He appears well-developed. No distress.  HENT:  Mouth/Throat: Oropharynx is clear and moist. No oropharyngeal exudate.  Neck: No thyromegaly present.  Cardiovascular: Normal rate, regular rhythm, normal heart sounds and intact distal pulses. Exam reveals no gallop.  No murmur heard. Respiratory: Effort normal and breath sounds normal. No respiratory distress. He has no wheezes. He has no rales.  GI: Soft. There is no abdominal tenderness.  Musculoskeletal:        General: No edema.     Comments: Amputations left 1st and second toes. Right 4th and 5th toes  Lymphadenopathy:    He has no cervical adenopathy.  Neurological: He is alert and oriented to person, place, and time.  President---"Donald Trump, Obama, Clinton----Bush" [id number] Recall 3/3  Skin: No rash noted.  No foot lesions ~3cm moveable mass on posterior neck towards the left (likely cyst) Irritative rash below this  Psychiatric:  He has a normal mood and affect. His behavior is normal.           Assessment & Plan:

## 2018-08-05 NOTE — Assessment & Plan Note (Signed)
Sites look fine

## 2018-08-06 LAB — HEPATIC FUNCTION PANEL
ALT: 22 U/L (ref 0–53)
AST: 24 U/L (ref 0–37)
Albumin: 4 g/dL (ref 3.5–5.2)
Alkaline Phosphatase: 57 U/L (ref 39–117)
Bilirubin, Direct: 0.1 mg/dL (ref 0.0–0.3)
Total Bilirubin: 0.5 mg/dL (ref 0.2–1.2)
Total Protein: 6.5 g/dL (ref 6.0–8.3)

## 2018-08-06 LAB — LIPID PANEL
Cholesterol: 111 mg/dL (ref 0–200)
HDL: 34.2 mg/dL — ABNORMAL LOW (ref >39.00)
LDL Cholesterol: 52 mg/dL (ref 0–99)
NonHDL: 76.98
Total CHOL/HDL Ratio: 3
Triglycerides: 123 mg/dL (ref 0.0–149.0)
VLDL: 24.6 mg/dL (ref 0.0–40.0)

## 2018-08-06 LAB — PARATHYROID HORMONE, INTACT (NO CA): PTH: 58 pg/mL (ref 14–64)

## 2018-08-06 LAB — RENAL FUNCTION PANEL
Albumin: 4 g/dL (ref 3.5–5.2)
BUN: 14 mg/dL (ref 6–23)
CO2: 30 mEq/L (ref 19–32)
Calcium: 8.9 mg/dL (ref 8.4–10.5)
Chloride: 103 mEq/L (ref 96–112)
Creatinine, Ser: 1.1 mg/dL (ref 0.40–1.50)
GFR: 66.85 mL/min (ref >60.00)
Glucose, Bld: 105 mg/dL — ABNORMAL HIGH (ref 70–99)
Phosphorus: 3.3 mg/dL (ref 2.3–4.6)
Potassium: 4 mEq/L (ref 3.5–5.1)
Sodium: 140 mEq/L (ref 135–145)

## 2018-08-06 LAB — HEMOGLOBIN A1C: Hgb A1c MFr Bld: 6.9 % — ABNORMAL HIGH (ref 4.6–6.5)

## 2018-08-06 LAB — VITAMIN D 25 HYDROXY (VIT D DEFICIENCY, FRACTURES): VITD: 21.71 ng/mL — ABNORMAL LOW (ref 30.00–100.00)

## 2018-08-08 ENCOUNTER — Other Ambulatory Visit (INDEPENDENT_AMBULATORY_CARE_PROVIDER_SITE_OTHER): Payer: Medicare HMO

## 2018-08-08 ENCOUNTER — Telehealth: Payer: Self-pay | Admitting: Radiology

## 2018-08-08 DIAGNOSIS — Z1211 Encounter for screening for malignant neoplasm of colon: Secondary | ICD-10-CM | POA: Diagnosis not present

## 2018-08-08 DIAGNOSIS — R195 Other fecal abnormalities: Secondary | ICD-10-CM

## 2018-08-08 LAB — FECAL OCCULT BLOOD, IMMUNOCHEMICAL: Fecal Occult Bld: POSITIVE — AB

## 2018-08-08 NOTE — Telephone Encounter (Signed)
[facility] called a positive ifob, results given to Dr Letvak 

## 2018-08-08 NOTE — Telephone Encounter (Signed)
Discussed with patient Will set up GI evaluation for colonoscopy

## 2018-08-12 ENCOUNTER — Other Ambulatory Visit: Payer: Self-pay | Admitting: Internal Medicine

## 2018-08-15 DIAGNOSIS — H43392 Other vitreous opacities, left eye: Secondary | ICD-10-CM | POA: Diagnosis not present

## 2018-08-15 DIAGNOSIS — H4312 Vitreous hemorrhage, left eye: Secondary | ICD-10-CM | POA: Diagnosis not present

## 2018-08-15 DIAGNOSIS — E113592 Type 2 diabetes mellitus with proliferative diabetic retinopathy without macular edema, left eye: Secondary | ICD-10-CM | POA: Diagnosis not present

## 2018-08-15 DIAGNOSIS — E113511 Type 2 diabetes mellitus with proliferative diabetic retinopathy with macular edema, right eye: Secondary | ICD-10-CM | POA: Diagnosis not present

## 2018-09-06 ENCOUNTER — Ambulatory Visit: Payer: Medicare HMO | Admitting: Gastroenterology

## 2018-09-17 DIAGNOSIS — E1142 Type 2 diabetes mellitus with diabetic polyneuropathy: Secondary | ICD-10-CM | POA: Diagnosis not present

## 2018-09-30 DIAGNOSIS — H4051X0 Glaucoma secondary to other eye disorders, right eye, stage unspecified: Secondary | ICD-10-CM | POA: Diagnosis not present

## 2018-09-30 DIAGNOSIS — E113511 Type 2 diabetes mellitus with proliferative diabetic retinopathy with macular edema, right eye: Secondary | ICD-10-CM | POA: Diagnosis not present

## 2018-10-02 DIAGNOSIS — H40031 Anatomical narrow angle, right eye: Secondary | ICD-10-CM | POA: Diagnosis not present

## 2018-10-02 DIAGNOSIS — H4051X4 Glaucoma secondary to other eye disorders, right eye, indeterminate stage: Secondary | ICD-10-CM | POA: Diagnosis not present

## 2018-10-08 ENCOUNTER — Ambulatory Visit: Payer: Medicare HMO | Admitting: Gastroenterology

## 2018-10-17 DIAGNOSIS — E1140 Type 2 diabetes mellitus with diabetic neuropathy, unspecified: Secondary | ICD-10-CM | POA: Diagnosis not present

## 2018-10-17 DIAGNOSIS — Z794 Long term (current) use of insulin: Secondary | ICD-10-CM | POA: Diagnosis not present

## 2018-10-17 DIAGNOSIS — L851 Acquired keratosis [keratoderma] palmaris et plantaris: Secondary | ICD-10-CM | POA: Diagnosis not present

## 2018-10-17 DIAGNOSIS — I739 Peripheral vascular disease, unspecified: Secondary | ICD-10-CM | POA: Diagnosis not present

## 2018-10-17 DIAGNOSIS — B351 Tinea unguium: Secondary | ICD-10-CM | POA: Diagnosis not present

## 2018-10-17 DIAGNOSIS — Z89422 Acquired absence of other left toe(s): Secondary | ICD-10-CM | POA: Diagnosis not present

## 2018-10-21 ENCOUNTER — Telehealth: Payer: Self-pay | Admitting: Cardiovascular Disease

## 2018-10-21 NOTE — Telephone Encounter (Signed)
I spoke with the patient to make sure there was nothing specific that needed to be stated in a letter to his gym. He is just needing a general statement that he is ok to work out at [employer].  I advised I would forward to Dr. Rockey Situ to review. We will call back with MD recommendations. He asked that if the letter can be done, that this is also mailed to him.  Mailing address on file confirmed with the patient.

## 2018-10-21 NOTE — Telephone Encounter (Signed)
Patient calling Patient would like a letter from Dr Rockey Situ saying he can work out at [employer] States his gym may require one Please call to discuss

## 2018-10-24 NOTE — Telephone Encounter (Signed)
Yes, It would be good for him to workout at the gym

## 2018-10-25 NOTE — Telephone Encounter (Signed)
Attempted to call pt but phone rang several times.. will try again later this afternoon,,   Letter to be mailed to the pt.

## 2018-10-29 NOTE — Telephone Encounter (Signed)
Called patient to let him know a letter was mailed to him. He was appreciative. He also thought he had MyChart but it is not showing active on my end. Resent a link to patient's email as he requested so he can sign up.

## 2018-10-31 DIAGNOSIS — E113511 Type 2 diabetes mellitus with proliferative diabetic retinopathy with macular edema, right eye: Secondary | ICD-10-CM | POA: Diagnosis not present

## 2018-10-31 DIAGNOSIS — H35372 Puckering of macula, left eye: Secondary | ICD-10-CM | POA: Diagnosis not present

## 2018-11-01 DIAGNOSIS — R69 Illness, unspecified: Secondary | ICD-10-CM | POA: Diagnosis not present

## 2018-11-14 ENCOUNTER — Other Ambulatory Visit: Payer: Self-pay

## 2018-11-14 ENCOUNTER — Encounter

## 2018-11-14 ENCOUNTER — Telehealth: Payer: Self-pay

## 2018-11-14 ENCOUNTER — Ambulatory Visit (INDEPENDENT_AMBULATORY_CARE_PROVIDER_SITE_OTHER): Payer: Medicare HMO | Admitting: Gastroenterology

## 2018-11-14 ENCOUNTER — Encounter: Payer: Self-pay | Admitting: Gastroenterology

## 2018-11-14 VITALS — BP 138/82 | HR 70 | Temp 97.9°F | Ht 69.5 in | Wt 265.0 lb

## 2018-11-14 DIAGNOSIS — K5909 Other constipation: Secondary | ICD-10-CM

## 2018-11-14 DIAGNOSIS — Z7901 Long term (current) use of anticoagulants: Secondary | ICD-10-CM | POA: Diagnosis not present

## 2018-11-14 DIAGNOSIS — R195 Other fecal abnormalities: Secondary | ICD-10-CM

## 2018-11-14 DIAGNOSIS — Z7902 Long term (current) use of antithrombotics/antiplatelets: Secondary | ICD-10-CM | POA: Diagnosis not present

## 2018-11-14 NOTE — Progress Notes (Signed)
[facility] VISIT   Primary Care Provider Venia Carbon, MD [place] [address] [phone]  Referring Provider Venia Carbon, MD [address] [place],  [place] [address] [phone]  Patient Profile: Benjamin Davidson is a 68 y.o. 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  The patient presents to the [facility] for an evaluation and management of problem(s) noted below:  Problem List 1. Positive FIT (fecal immunochemical test)   2. Chronic constipation   3. Chronic anticoagulation   4. Antiplatelet or antithrombotic long-term use     History of Present Illness This is the patient's first visit to the outpatient of our GI clinic.  He has a history of longer standing issues of constipation.  Constipation to him means having hard stools but he goes on a nearly every day or every other day basis.  He has never noted any blood in the stool (melena/hematochezia/maroon color).  Patient underwent FIT testing recently and this returned positive.  He is on anticoagulation and antiplatelet therapy.  Patient describes no other significant GI symptoms.  He has never had an upper or lower endoscopy.  GI Review of Systems Positive as above Negative for pyrosis, dysphagia, odynophagia, abdominal pain, nausea, vomiting, change in bowel habits  Review of Systems General: Denies fevers/chills/weight loss HEENT: Denies oral lesions Cardiovascular: Denies chest pain/palpitations Pulmonary: Denies shortness of breath Gastroenterological: See HPI Genitourinary: Denies darkened urine Hematological: Denies easy bruising/bleeding Endocrine: Denies temperature intolerance  Dermatological: Denies jaundice Psychological: Mood is stable   Medications Current Outpatient Medications    Medication Sig Dispense Refill   aspirin EC 81 MG tablet Take 1 tablet (81 mg total) by mouth daily. 150 tablet 2   carvedilol (COREG) 6.25 MG tablet Take 1 tablet (6.25 mg total) by mouth 2 (two) times daily with a meal. 180 tablet 3   clopidogrel (PLAVIX) 75 MG tablet TAKE 1 TABLET BY MOUTH EVERY MORNING WITH BREAKFAST 90 tablet 3   furosemide (LASIX) 20 MG tablet TAKE 1 TABLET BY MOUTH ONCE A DAY. MAY TAKE ONE ADDITIONAL TABLET IF INCREASED SWELLING. 180 tablet 3   insulin regular (NOVOLIN R,HUMULIN R) 100 units/mL injection Inject into the skin.     losartan (COZAAR) 25 MG tablet Take 1 tablet (25 mg total) by mouth daily. 90 tablet 3   NOVOLIN 70/30 (70-30) 100 UNIT/ML injection INJECT 35 UNITS SUBCUTANEOUSLY TWICE A DAY AS DIRECTED 20 mL 11   potassium chloride (K-DUR) 10 MEQ tablet Take 1 tablet (10 mEq total) by mouth daily. 90 tablet 3   rosuvastatin (CRESTOR) 20 MG tablet Take 1 tablet (20 mg total) by mouth daily. 90 tablet 3   sertraline (ZOLOFT) 100 MG tablet TAKE 1 TABLET BY MOUTH ONCE DAILY 90 tablet 3   triamcinolone cream (KENALOG) 0.1 % Apply 1 application topically 2 (two) times daily as needed. 45 g 1   TRUEPLUS INSULIN SYRINGE 31G X 5/16" 0.5 ML MISC USE AS DIRECTED TWICE DAILY 100 each 7   XARELTO 10 MG TABS tablet TAKE 1 TABLET BY MOUTH ONCE A DAY 30 tablet 11   No current facility-administered medications for this visit.     Allergies Allergies  Allergen Reactions   Lisinopril     Cough Flu like symptoms     Histories Past Medical History:  Diagnosis Date   Anemia    Asthma  childhood   CHF (congestive heart failure) (HCC)    Coronary artery disease    DM (diabetes mellitus), type 2, uncontrolled, periph vascular complic (HCC)    Type II   Edema    feet/legs   Hypertension    Left leg DVT (HCC)    MDD (recurrent major depressive disorder) in remission ([place])    Peripheral vascular disease (HCC)    Left toe amputations. Dr  Lucky Cowboy   Pure hypercholesterolemia    Shortness of breath dyspnea    Syncope    resolved   Past Surgical History:  Procedure Laterality Date   AMPUTATION TOE Left 03/17/2017   Procedure: FIRST RAY RESECTION LEFT FOOT;  Surgeon: Sharlotte Alamo, DPM;  Location: [facility];  Service: Podiatry;  Laterality: Left;   CARDIAC CATHETERIZATION     CATARACT EXTRACTION W/PHACO Left 06/03/2015   Procedure: CATARACT EXTRACTION PHACO AND INTRAOCULAR LENS PLACEMENT (IOC);  Surgeon: Eulogio Bear, MD;  Location: [facility];  Service: Ophthalmology;  Laterality: Left;  Lot# [id number] H [place]: 1.06 AP%: 5.5 CDE: 3.69   CATARACT EXTRACTION W/PHACO Right 12/10/2017   Procedure: Aborted phacoemusification of the right eye.  No intraocular lens was implated.;  Surgeon: Eulogio Bear, MD;  Location: [facility];  Service: Ophthalmology;  Laterality: Right;  Diabetic - insulin   CORONARY ARTERY BYPASS GRAFT  2008   EYE SURGERY     PARS PLANA VITRECTOMY Right 02/17/2015   Procedure: PARS PLANA VITRECTOMY WITH 25 GAUGE, endolaser, right eye;  Surgeon: Milus Height, MD;  Location: [facility];  Service: Ophthalmology;  Laterality: Right;  fluid pack lot # [id number] H endolaser: 200w, 1439p   PERCUTANEOUS STENT INTERVENTION     right leg   PERIPHERAL VASCULAR BALLOON ANGIOPLASTY Left 03/20/2017   Procedure: PERIPHERAL VASCULAR BALLOON ANGIOPLASTY;  Surgeon: Katha Cabal, MD;  Location: [facility];  Service: Cardiovascular;  Laterality: Left;   PERIPHERALLY INSERTED CENTRAL CATHETER INSERTION  2013   TOE AMPUTATION     left 5th, right 4th and 5th   Social History   Socioeconomic History   Marital status: Divorced    Spouse name: Not on file   Number of children: 0   Years of education: Not on file   Highest education level: Not on file  Occupational History   Occupation: Licensed conveyancer    Comment: Disabled since 2000's  Social Needs   Financial resource strain: Not on file    Food insecurity    Worry: Not on file    Inability: Not on file   Transportation needs    Medical: Not on file    Non-medical: Not on file  Tobacco Use   Smoking status: Never Smoker   Smokeless tobacco: Never Used  Substance and Sexual Activity   Alcohol use: No    Alcohol/week: 0.0 standard drinks   Drug use: No   Sexual activity: Not on file  Lifestyle   Physical activity    Days per week: Not on file    Minutes per session: Not on file   Stress: Not on file  Relationships   Social connections    Talks on phone: Not on file    Gets together: Not on file    Attends religious service: Not on file    Active member of club or organization: Not on file    Attends meetings of clubs or organizations: Not on file    Relationship status: Not on file   Intimate partner violence  Fear of current or ex partner: Not on file    Emotionally abused: Not on file    Physically abused: Not on file    Forced sexual activity: Not on file  Other Topics Concern   Not on file  Social History Narrative   Has living will   [name], sister, as health care POA.   Would accept resuscitation attempts   Not sure about tube feeds   Family History  Problem Relation Age of Onset   Heart disease Sister    Hyperlipidemia Brother    Colon cancer Neg Hx    Esophageal cancer Neg Hx    Inflammatory bowel disease Neg Hx    Liver disease Neg Hx    Pancreatic cancer Neg Hx    Rectal cancer Neg Hx    Stomach cancer Neg Hx    I have reviewed his medical, social, and family history in detail and updated the electronic medical record as necessary.    PHYSICAL EXAMINATION  BP 138/82 (BP Location: Left Arm, Patient Position: Sitting, Cuff Size: Large)    Pulse 70    Temp 97.9 F (36.6 C) (Other (Comment))    Ht 5' 9.5" (1.765 m)    Wt 265 lb (120.2 kg)    BMI 38.57 kg/m  Wt Readings from Last 3 Encounters:  11/14/18 265 lb (120.2 kg)  08/05/18 260 lb (117.9 kg)  01/21/18 247  lb (112 kg)  GEN: NAD, appears stated age, doesn't appear chronically ill PSYCH: Cooperative, without pressured speech EYE: Conjunctivae pink, sclerae anicteric ENT: MMM, without oral ulcers, no erythema or exudates noted NECK: Supple CV: RR without R/Gs  RESP: CTAB posteriorly, without wheezing GI: NABS, soft, protuberant abdomen, small ventral diastases, nontender, without rebound or guarding, no HSM appreciated MSK/EXT: Trace bilateral lower extremity edema SKIN: No jaundice NEURO:  Alert & Oriented x 3, no focal deficits   REVIEW OF DATA  I reviewed the following data at the time of this encounter:  GI Procedures and Studies  No relevant studies to review  Laboratory Studies  Reviewed those in epic  Imaging Studies  No relevant studies to review   ASSESSMENT  Mr. Towles is a 68 y.o. 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  The patient is seen today for evaluation and management of:  1. Positive FIT (fecal immunochemical test)   2. Chronic constipation   3. Chronic anticoagulation   4. Antiplatelet or antithrombotic long-term use    The patient is hemodynamically clinically stable.  Longstanding history of chronic constipation symptoms.  No significant change in bowel habits.  We will plan to have patient initiate fiber supplementation on a daily basis.  Ideally FiberCon once daily or Metamucil or and of the other fibers.  High-fiber diet is recommended.  Patient has a positive FIT test and requires additional work-up with a colonoscopy as diagnostic purposes.  No other red flag symptoms.  The risks and benefits of endoscopic evaluation were discussed with the patient; these include but are not limited to the risk of perforation, infection, bleeding, missed lesions, lack of diagnosis, severe illness requiring hospitalization, as well as  anesthesia and sedation related illnesses.  The patient is agreeable to proceed but asks that we discuss his case with his sister, Jeannetta Nap.  He does not have a number but is going to talk with her and have her recheck to [place] next  week and my staff or myself would be happy to talk with her about the reasoning for her brothers next steps in evaluation.  I am going to go ahead and order a CBC as well as iron indices just to ensure that the patient does not have any evidence of iron deficiency that would require [place] to perform an upper endoscopy.  Additional work-up will be dictated based on follow-up after his endoscopic evaluation.  We will get the okay from his providers to hold Plavix for 5 days and come off Xarelto for 2 days.  All patient questions were answered, to the best of my ability, and the patient agrees to the aforementioned plan of action with follow-up as indicated.   PLAN  Laboratories as outlined below Diagnostic colonoscopy for positive FIT If patient has evidence of anemia with iron deficiency then will add on diagnostic EGD Patient to discuss with sister and she will call the office next week and advanced RN Gerarda Fraction or myself can discuss care plan with her as necessary   Orders Placed This Encounter  Procedures   CBC   IBC + Ferritin    New Prescriptions   No medications on file   Modified Medications   No medications on file    Planned Follow Up No follow-ups on file.   Justice Britain, MD [facility] Office # [phone]

## 2018-11-14 NOTE — Telephone Encounter (Signed)
   Dr. Franchot Gallo prescribes the Plavix for PAD, will need to contact him to address holding Plavix for procedure.  He has a history of DVT, reason for the Xarelto.  However, this is prescribed by Dr. Silvio Pate.  Will route these messages to the above physicians so that they can address holding antiplatelet agents and holding anticoagulation.  Rosaria Ferries, PA-C 11/14/2018 3:25 PM [facility] [id number], [place], [place], [address] [phone]

## 2018-11-14 NOTE — Patient Instructions (Addendum)
Your provider has requested that you go to the basement level for lab work before leaving today. Press "B" on the elevator. The lab is located at the first door on the left as you exit the elevator.  It has been recommended to you by your physician that you have a(n) Colonoscopy  completed. Per your request, we did not schedule the procedure(s) today. Please contact our office at [phone] should you decide to have the procedure completed. You will be scheduled for a pre-visit and procedure at that time.  Request to hold your Plavix and Xarelto has been sent your cardiologist so that we can obtain clearance for your pending colonoscopy.   Start Metamucil once daily.   Thank you for choosing me and [facility].  Dr. Rush Landmark

## 2018-11-14 NOTE — Telephone Encounter (Signed)
Request for surgical clearance:     Endoscopy Procedure  What type of surgery is being performed?     Colonoscopy   When is this surgery scheduled?     TBD  What type of clearance is required ?   Pharmacy  Are there any medications that need to be held prior to surgery and how long? Hold Plavix x5 days and Xarelto x2 days prior to colonoscopy.   Practice name and name of physician performing surgery?      [facility]  What is your office phone and fax number?      Phone- [phone]  [phone]  Anesthesia type (None, local, MAC, general) ?       MAC

## 2018-11-17 ENCOUNTER — Encounter: Payer: Self-pay | Admitting: Gastroenterology

## 2018-11-17 DIAGNOSIS — Z7901 Long term (current) use of anticoagulants: Secondary | ICD-10-CM | POA: Insufficient documentation

## 2018-11-17 DIAGNOSIS — Z7902 Long term (current) use of antithrombotics/antiplatelets: Secondary | ICD-10-CM | POA: Insufficient documentation

## 2018-11-17 DIAGNOSIS — R195 Other fecal abnormalities: Secondary | ICD-10-CM | POA: Insufficient documentation

## 2018-11-17 DIAGNOSIS — K5909 Other constipation: Secondary | ICD-10-CM | POA: Insufficient documentation

## 2018-11-21 ENCOUNTER — Other Ambulatory Visit: Payer: Self-pay | Admitting: Internal Medicine

## 2018-11-26 ENCOUNTER — Telehealth: Payer: Self-pay

## 2018-11-26 NOTE — Telephone Encounter (Signed)
Request for surgical clearance:     Endoscopy Procedure  What type of surgery is being performed?     Colonoscopy     When is this surgery scheduled?     TBD  What type of clearance is required ?   Pharmacy  Are there any medications that need to be held prior to surgery and how long?  Xarelto x2 days prior to colonoscopy.   Practice name and name of physician performing surgery?      [facility]  What is your office phone and fax number?      Phone- [phone]  [phone]  Anesthesia type (None, local, MAC, general) ?       MAC

## 2018-11-26 NOTE — Telephone Encounter (Signed)
Request for surgical clearance:     Endoscopy Procedure  What type of surgery is being performed?     Colonoscopy     When is this surgery scheduled?     TBD  What type of clearance is required ?   Pharmacy  Are there any medications that need to be held prior to surgery and how long? Hold Plavix x5 days prior to procedure.  Practice name and name of physician performing surgery?      [facility]  What is your office phone and fax number?      Phone- [phone]  [phone]  Anesthesia type (None, local, MAC, general) ?       MAC

## 2018-11-27 NOTE — Telephone Encounter (Signed)
Okay to hold xarelto for 2 days prior to the procedure. Begin as soon as possible after.

## 2018-11-28 NOTE — Telephone Encounter (Signed)
FYI Dr. Aris Lot to hold Xarelto x2 days prior to pending endoscopies.

## 2018-11-29 DIAGNOSIS — R69 Illness, unspecified: Secondary | ICD-10-CM | POA: Diagnosis not present

## 2018-11-29 DIAGNOSIS — I509 Heart failure, unspecified: Secondary | ICD-10-CM | POA: Diagnosis not present

## 2018-11-29 DIAGNOSIS — I82509 Chronic embolism and thrombosis of unspecified deep veins of unspecified lower extremity: Secondary | ICD-10-CM | POA: Diagnosis not present

## 2018-11-29 DIAGNOSIS — E785 Hyperlipidemia, unspecified: Secondary | ICD-10-CM | POA: Diagnosis not present

## 2018-11-29 DIAGNOSIS — Z794 Long term (current) use of insulin: Secondary | ICD-10-CM | POA: Diagnosis not present

## 2018-11-29 DIAGNOSIS — E1159 Type 2 diabetes mellitus with other circulatory complications: Secondary | ICD-10-CM | POA: Diagnosis not present

## 2018-11-29 DIAGNOSIS — Z89421 Acquired absence of other right toe(s): Secondary | ICD-10-CM | POA: Diagnosis not present

## 2018-11-29 DIAGNOSIS — E1151 Type 2 diabetes mellitus with diabetic peripheral angiopathy without gangrene: Secondary | ICD-10-CM | POA: Diagnosis not present

## 2018-11-29 DIAGNOSIS — E261 Secondary hyperaldosteronism: Secondary | ICD-10-CM | POA: Diagnosis not present

## 2018-11-29 DIAGNOSIS — I110 Hypertensive heart disease with heart failure: Secondary | ICD-10-CM | POA: Diagnosis not present

## 2018-11-29 NOTE — Telephone Encounter (Signed)
Thank you for update on Xarelto for which I agree. Will we be able to hold the Plavix for 5-days prior to procedure? GM

## 2018-11-29 NOTE — Telephone Encounter (Signed)
I think that should be fine but I will forward this to Dr Rockey Situ as well

## 2018-12-01 NOTE — Telephone Encounter (Signed)
Thanks for update. GM 

## 2018-12-01 NOTE — Telephone Encounter (Signed)
Should be fine to hold the Xarelto 2 days Hold Plavix 5 days Would stay on aspirin 81 daily thx

## 2018-12-02 DIAGNOSIS — H4312 Vitreous hemorrhage, left eye: Secondary | ICD-10-CM | POA: Diagnosis not present

## 2018-12-02 DIAGNOSIS — H35371 Puckering of macula, right eye: Secondary | ICD-10-CM | POA: Diagnosis not present

## 2018-12-02 DIAGNOSIS — H43392 Other vitreous opacities, left eye: Secondary | ICD-10-CM | POA: Diagnosis not present

## 2018-12-02 DIAGNOSIS — E113513 Type 2 diabetes mellitus with proliferative diabetic retinopathy with macular edema, bilateral: Secondary | ICD-10-CM | POA: Diagnosis not present

## 2018-12-02 DIAGNOSIS — E113511 Type 2 diabetes mellitus with proliferative diabetic retinopathy with macular edema, right eye: Secondary | ICD-10-CM | POA: Diagnosis not present

## 2018-12-11 ENCOUNTER — Encounter: Payer: Self-pay | Admitting: Gastroenterology

## 2018-12-19 DIAGNOSIS — E1142 Type 2 diabetes mellitus with diabetic polyneuropathy: Secondary | ICD-10-CM | POA: Diagnosis not present

## 2018-12-25 DIAGNOSIS — E113511 Type 2 diabetes mellitus with proliferative diabetic retinopathy with macular edema, right eye: Secondary | ICD-10-CM | POA: Diagnosis not present

## 2018-12-25 DIAGNOSIS — E113513 Type 2 diabetes mellitus with proliferative diabetic retinopathy with macular edema, bilateral: Secondary | ICD-10-CM | POA: Diagnosis not present

## 2019-01-01 ENCOUNTER — Telehealth: Payer: Self-pay | Admitting: *Deleted

## 2019-01-01 NOTE — Telephone Encounter (Signed)
Patient no showed for 1530 appointment today with Previsit. Called the patient to reschedule. No answer, message left for the patient to call back by 1700 to reschedule or risk cancellation of procedure scheduled for 01/14/2019.

## 2019-01-02 ENCOUNTER — Encounter (INDEPENDENT_AMBULATORY_CARE_PROVIDER_SITE_OTHER): Payer: Medicare HMO

## 2019-01-02 ENCOUNTER — Ambulatory Visit (INDEPENDENT_AMBULATORY_CARE_PROVIDER_SITE_OTHER): Payer: Medicare HMO | Admitting: Vascular Surgery

## 2019-01-14 ENCOUNTER — Encounter: Payer: Medicare HMO | Admitting: Gastroenterology

## 2019-01-16 DIAGNOSIS — E1140 Type 2 diabetes mellitus with diabetic neuropathy, unspecified: Secondary | ICD-10-CM | POA: Diagnosis not present

## 2019-01-16 DIAGNOSIS — Z794 Long term (current) use of insulin: Secondary | ICD-10-CM | POA: Diagnosis not present

## 2019-01-16 DIAGNOSIS — B351 Tinea unguium: Secondary | ICD-10-CM | POA: Diagnosis not present

## 2019-01-16 DIAGNOSIS — Z89422 Acquired absence of other left toe(s): Secondary | ICD-10-CM | POA: Diagnosis not present

## 2019-01-16 DIAGNOSIS — I739 Peripheral vascular disease, unspecified: Secondary | ICD-10-CM | POA: Diagnosis not present

## 2019-01-29 DIAGNOSIS — E113513 Type 2 diabetes mellitus with proliferative diabetic retinopathy with macular edema, bilateral: Secondary | ICD-10-CM | POA: Diagnosis not present

## 2019-01-29 DIAGNOSIS — E113511 Type 2 diabetes mellitus with proliferative diabetic retinopathy with macular edema, right eye: Secondary | ICD-10-CM | POA: Diagnosis not present

## 2019-02-06 ENCOUNTER — Other Ambulatory Visit: Payer: Self-pay

## 2019-02-06 ENCOUNTER — Encounter: Payer: Self-pay | Admitting: Internal Medicine

## 2019-02-06 ENCOUNTER — Ambulatory Visit (INDEPENDENT_AMBULATORY_CARE_PROVIDER_SITE_OTHER): Payer: Medicare HMO | Admitting: Internal Medicine

## 2019-02-06 VITALS — BP 128/76 | HR 74 | Temp 97.6°F | Ht 70.0 in | Wt 268.0 lb

## 2019-02-06 DIAGNOSIS — I25119 Atherosclerotic heart disease of native coronary artery with unspecified angina pectoris: Secondary | ICD-10-CM | POA: Diagnosis not present

## 2019-02-06 DIAGNOSIS — S98132A Complete traumatic amputation of one left lesser toe, initial encounter: Secondary | ICD-10-CM | POA: Diagnosis not present

## 2019-02-06 DIAGNOSIS — F3340 Major depressive disorder, recurrent, in remission, unspecified: Secondary | ICD-10-CM

## 2019-02-06 DIAGNOSIS — R195 Other fecal abnormalities: Secondary | ICD-10-CM

## 2019-02-06 DIAGNOSIS — S98131A Complete traumatic amputation of one right lesser toe, initial encounter: Secondary | ICD-10-CM

## 2019-02-06 DIAGNOSIS — I739 Peripheral vascular disease, unspecified: Secondary | ICD-10-CM

## 2019-02-06 DIAGNOSIS — R69 Illness, unspecified: Secondary | ICD-10-CM | POA: Diagnosis not present

## 2019-02-06 DIAGNOSIS — I82409 Acute embolism and thrombosis of unspecified deep veins of unspecified lower extremity: Secondary | ICD-10-CM

## 2019-02-06 DIAGNOSIS — E1142 Type 2 diabetes mellitus with diabetic polyneuropathy: Secondary | ICD-10-CM | POA: Diagnosis not present

## 2019-02-06 LAB — POCT GLYCOSYLATED HEMOGLOBIN (HGB A1C): Hemoglobin A1C: 8 % — AB (ref 4.0–5.6)

## 2019-02-06 LAB — HM DIABETES FOOT EXAM

## 2019-02-06 MED ORDER — DULAGLUTIDE 0.75 MG/0.5ML ~~LOC~~ SOAJ: 0.7500 mg | SUBCUTANEOUS | 4 refills | Status: DC

## 2019-02-06 NOTE — Assessment & Plan Note (Signed)
Still on sertraline With COVID, etc--- he thinks it has affected him. Not able to get out much and is socially isolated Doesn't feel this is an every day thing

## 2019-02-06 NOTE — Assessment & Plan Note (Signed)
Seems to have significant DOE as anginal equivalent May be just deconditioning --but he doesn't make any fitness effort

## 2019-02-06 NOTE — Patient Instructions (Signed)
Please try the new weekly injection medication (dulaglutide). Let me know if it is too expensive for you or if you have any problems with it.

## 2019-02-06 NOTE — Assessment & Plan Note (Signed)
Will try to reschedule his RN visit and colonoscopy

## 2019-02-06 NOTE — Assessment & Plan Note (Signed)
Feet cool today without pulses Does keep up with vascular surgeon (though not the most recent)

## 2019-02-06 NOTE — Assessment & Plan Note (Signed)
4th and 5th Sites are clean

## 2019-02-06 NOTE — Assessment & Plan Note (Addendum)
Lab Results  Component Value Date   HGBA1C 8.0 (A) 02/06/2019   Control has worsened I urged him to work harder on healthy lifestyle He had diarrhea with metformin Willing to try other medication--will see if dulaglutide is covered He then asks to hold off on it--will give him 6 months to improve lifestyle

## 2019-02-06 NOTE — Progress Notes (Signed)
Subjective:    Patient ID: Benjamin Davidson, male    DOB: 02-16-1950, 69 y.o.   MRN: [id number]  HPI Here for follow up of diabetes and other chronic health conditions  This visit occurred during the SARS-CoV-2 public health emergency.  Safety protocols were in place, including screening questions prior to the visit, additional usage of staff PPE, and extensive cleaning of exam room while observing appropriate contact time as indicated for disinfecting solutions.   Weight is up some Not getting out much Not that great with healthy eating--but does try  Checking sugars twice a day Tends to still be around 200--not always fasting (better in the morning) Ophthalmology still working on right eye---injections (better)  Feet okay Amputation sites clean  No leg swelling or pain  Current Outpatient Medications on File Prior to Visit  Medication Sig Dispense Refill  . aspirin EC 81 MG tablet Take 1 tablet (81 mg total) by mouth daily. 150 tablet 2  . carvedilol (COREG) 6.25 MG tablet Take 1 tablet (6.25 mg total) by mouth 2 (two) times daily with a meal. 180 tablet 3  . clopidogrel (PLAVIX) 75 MG tablet TAKE 1 TABLET BY MOUTH EVERY MORNING WITH BREAKFAST 90 tablet 3  . furosemide (LASIX) 20 MG tablet TAKE 1 TABLET BY MOUTH ONCE A DAY. MAY TAKE ONE ADDITIONAL TABLET IF INCREASED SWELLING. 180 tablet 3  . insulin regular (NOVOLIN R,HUMULIN R) 100 units/mL injection Inject into the skin.    Marland Kitchen losartan (COZAAR) 25 MG tablet Take 1 tablet (25 mg total) by mouth daily. 90 tablet 3  . NOVOLIN 70/30 (70-30) 100 UNIT/ML injection INJECT 35 UNITS SUBCUTANEOUSLY TWICE A DAY AS DIRECTED 20 mL 11  . potassium chloride (K-DUR) 10 MEQ tablet Take 1 tablet (10 mEq total) by mouth daily. 90 tablet 3  . rosuvastatin (CRESTOR) 20 MG tablet Take 1 tablet (20 mg total) by mouth daily. 90 tablet 3  . sertraline (ZOLOFT) 100 MG tablet TAKE 1 TABLET BY MOUTH ONCE A DAY 90 tablet 3  . triamcinolone cream (KENALOG)  0.1 % Apply 1 application topically 2 (two) times daily as needed. 45 g 1  . TRUEPLUS INSULIN SYRINGE 31G X 5/16" 0.5 ML MISC USE AS DIRECTED TWICE DAILY 100 each 7  . XARELTO 10 MG TABS tablet TAKE 1 TABLET BY MOUTH ONCE A DAY 30 tablet 11   No current facility-administered medications on file prior to visit.    Allergies  Allergen Reactions  . Losartan Other (See Comments)    Not an allergy -  Not an allergy -    . Lisinopril     Cough Flu like symptoms     Past Medical History:  Diagnosis Date  . Anemia   . Asthma    childhood  . CHF (congestive heart failure) ([place])   . Coronary artery disease   . DM (diabetes mellitus), type 2, uncontrolled, periph vascular complic ([place])    Type II  . Edema    feet/legs  . Hypertension   . Left leg DVT ([place])   . MDD (recurrent major depressive disorder) in remission ([place])   . Peripheral vascular disease ([facility])    Left toe amputations. Dr Lucky Cowboy  . Pure hypercholesterolemia   . Shortness of breath dyspnea   . Syncope    resolved    Past Surgical History:  Procedure Laterality Date  . AMPUTATION TOE Left 03/17/2017   Procedure: FIRST RAY RESECTION LEFT FOOT;  Surgeon: Sharlotte Alamo, DPM;  Location: [facility];  Service: Podiatry;  Laterality: Left;  . CARDIAC CATHETERIZATION    . CATARACT EXTRACTION W/PHACO Left 06/03/2015   Procedure: CATARACT EXTRACTION PHACO AND INTRAOCULAR LENS PLACEMENT (IOC);  Surgeon: Eulogio Bear, MD;  Location: [facility];  Service: Ophthalmology;  Laterality: Left;  Lot# [id number] H [place]: 1.06 AP%: 5.5 CDE: 3.69  . CATARACT EXTRACTION W/PHACO Right 12/10/2017   Procedure: Aborted phacoemusification of the right eye.  No intraocular lens was implated.;  Surgeon: Eulogio Bear, MD;  Location: [place];  Service: Ophthalmology;  Laterality: Right;  Diabetic - insulin  . CORONARY ARTERY BYPASS GRAFT  2008  . EYE SURGERY    . PARS PLANA VITRECTOMY Right 02/17/2015   Procedure: PARS PLANA VITRECTOMY  WITH 25 GAUGE, endolaser, right eye;  Surgeon: Milus Height, MD;  Location: [facility];  Service: Ophthalmology;  Laterality: Right;  fluid pack lot # [id number] H endolaser: 200w, [id number]  . PERCUTANEOUS STENT INTERVENTION     right leg  . PERIPHERAL VASCULAR BALLOON ANGIOPLASTY Left 03/20/2017   Procedure: PERIPHERAL VASCULAR BALLOON ANGIOPLASTY;  Surgeon: Katha Cabal, MD;  Location: [place] [facility];  Service: Cardiovascular;  Laterality: Left;  . PERIPHERALLY INSERTED CENTRAL CATHETER INSERTION  2013  . TOE AMPUTATION     left 5th, right 4th and 5th    Family History  Problem Relation Age of Onset  . Heart disease Sister   . Hyperlipidemia Brother   . Colon cancer Neg Hx   . Esophageal cancer Neg Hx   . Inflammatory bowel disease Neg Hx   . Liver disease Neg Hx   . Pancreatic cancer Neg Hx   . Rectal cancer Neg Hx   . Stomach cancer Neg Hx     Social History   Socioeconomic History  . Marital status: Divorced    Spouse name: Not on file  . Number of children: 0  . Years of education: Not on file  . Highest education level: Not on file  Occupational History  . Occupation: [name]    Comment: Disabled since 2000's  Tobacco Use  . Smoking status: Never Smoker  . Smokeless tobacco: Never Used  Substance and Sexual Activity  . Alcohol use: No    Alcohol/week: 0.0 standard drinks  . Drug use: No  . Sexual activity: Not on file  Other Topics Concern  . Not on file  Social History Narrative   Has living will   [name], sister, as health care POA.   Would accept resuscitation attempts   Not sure about tube feeds   Social Determinants of Health   Financial Resource Strain:   . Difficulty of Paying Living Expenses: Not on file  Food Insecurity:   . Worried About Charity fundraiser in the Last Year: Not on file  . Ran Out of Food in the Last Year: Not on file  Transportation Needs:   . Lack of Transportation (Medical): Not on file  . Lack of  Transportation (Non-Medical): Not on file  Physical Activity:   . Days of Exercise per Week: Not on file  . Minutes of Exercise per Session: Not on file  Stress:   . Feeling of Stress : Not on file  Social Connections:   . Frequency of Communication with Friends and Family: Not on file  . Frequency of Social Gatherings with Friends and Family: Not on file  . Attends Religious Services: Not on file  . Active Member of Clubs or Organizations: Not  on file  . Attends Archivist Meetings: Not on file  . Marital Status: Not on file  Intimate Partner Violence:   . Fear of Current or Ex-Partner: Not on file  . Emotionally Abused: Not on file  . Physically Abused: Not on file  . Sexually Abused: Not on file   Review of Systems Sleeps okay Bowels are good No chest pain  Chronic stable DOE---even just carrying any groceries from the car Missed GI appt for positive FIT    Objective:   Physical Exam  Constitutional: No distress.  Cardiovascular: Normal rate, regular rhythm and normal heart sounds. Exam reveals no gallop.  No murmur heard. No pedal pulses  Respiratory: Effort normal and breath sounds normal. No respiratory distress. He has no wheezes. He has no rales.  Musculoskeletal:        General: No edema.     Comments: Amputation sites are all clean and dry  Skin:  No foot lesions  Psychiatric:  Not clearly depressed           Assessment & Plan:

## 2019-02-06 NOTE — Assessment & Plan Note (Signed)
Left 5th and great toe Sites clean

## 2019-02-06 NOTE — Assessment & Plan Note (Signed)
No evidence of recurrence 

## 2019-02-06 NOTE — Assessment & Plan Note (Signed)
BMI is 38 with CAD, DM, HTN, PVD, etc Urged him to be better with his eating

## 2019-02-07 ENCOUNTER — Encounter: Payer: Self-pay | Admitting: Gastroenterology

## 2019-02-07 NOTE — Progress Notes (Signed)
I called [facility].  They have r/s's pt to 02-25-19 for preop/03-11-19 for colonoscopy.

## 2019-02-25 ENCOUNTER — Ambulatory Visit (AMBULATORY_SURGERY_CENTER): Payer: Self-pay | Admitting: *Deleted

## 2019-02-25 ENCOUNTER — Other Ambulatory Visit: Payer: Self-pay

## 2019-02-25 ENCOUNTER — Telehealth: Payer: Self-pay | Admitting: Gastroenterology

## 2019-02-25 VITALS — Ht 70.0 in | Wt 265.0 lb

## 2019-02-25 DIAGNOSIS — R195 Other fecal abnormalities: Secondary | ICD-10-CM

## 2019-02-25 NOTE — Progress Notes (Signed)
Pt's previsit is done over the phone and all paperwork (prep instructions, blank consent form to just read over, pre-procedure acknowledgement form and stamped envelope) sent to patient  Pt is aware that care partner will wait in the car during procedure; if they feel like they will be too hot or cold to wait in the car; they may wait in the 4 th floor lobby. Patient is aware to bring only one care partner. We want them to wear a mask (we do not have any that we can provide them), practice social distancing, and we will check their temperatures when they get here.  I did remind the patient that their care partner needs to stay in the parking lot the entire time and have a cell phone available, we will call them when the pt is ready for discharge. Patient will wear mask into building.  COVID TEST 03-07-19 AT [facility]  No egg or soy allergy  No home oxygen use or problems with anesthesia  No medications for weight loss taken  emmi information given

## 2019-02-25 NOTE — Telephone Encounter (Signed)
Spoke with sister and reviewed pt's instructions.  Instructions emailed to her.  Xarelto clarified.  I reprinted instructions with correct medications clarified and mailed these to pt

## 2019-03-05 ENCOUNTER — Encounter: Payer: Self-pay | Admitting: Gastroenterology

## 2019-03-07 ENCOUNTER — Other Ambulatory Visit
Admission: RE | Admit: 2019-03-07 | Discharge: 2019-03-07 | Disposition: A | Discharge status: Home / Self Care | Payer: Medicare HMO | Source: Ambulatory Visit | Attending: Gastroenterology | Admitting: Gastroenterology

## 2019-03-07 DIAGNOSIS — Z01812 Encounter for preprocedural laboratory examination: Secondary | ICD-10-CM | POA: Diagnosis not present

## 2019-03-07 DIAGNOSIS — Z20822 Contact with and (suspected) exposure to covid-19: Secondary | ICD-10-CM | POA: Insufficient documentation

## 2019-03-08 LAB — SARS CORONAVIRUS 2 (TAT 6-24 HRS): SARS Coronavirus 2: NEGATIVE

## 2019-03-11 ENCOUNTER — Other Ambulatory Visit: Payer: Self-pay

## 2019-03-11 ENCOUNTER — Encounter: Payer: Self-pay | Admitting: Gastroenterology

## 2019-03-11 ENCOUNTER — Other Ambulatory Visit (INDEPENDENT_AMBULATORY_CARE_PROVIDER_SITE_OTHER): Payer: Medicare HMO

## 2019-03-11 ENCOUNTER — Ambulatory Visit (AMBULATORY_SURGERY_CENTER): Payer: Medicare HMO | Admitting: Gastroenterology

## 2019-03-11 VITALS — BP 143/59 | HR 67 | Temp 96.9°F | Resp 8 | Ht 70.0 in | Wt 265.0 lb

## 2019-03-11 DIAGNOSIS — D509 Iron deficiency anemia, unspecified: Secondary | ICD-10-CM

## 2019-03-11 DIAGNOSIS — D122 Benign neoplasm of ascending colon: Secondary | ICD-10-CM

## 2019-03-11 DIAGNOSIS — D128 Benign neoplasm of rectum: Secondary | ICD-10-CM | POA: Diagnosis not present

## 2019-03-11 DIAGNOSIS — D123 Benign neoplasm of transverse colon: Secondary | ICD-10-CM

## 2019-03-11 DIAGNOSIS — Z1211 Encounter for screening for malignant neoplasm of colon: Secondary | ICD-10-CM | POA: Diagnosis not present

## 2019-03-11 DIAGNOSIS — D120 Benign neoplasm of cecum: Secondary | ICD-10-CM

## 2019-03-11 DIAGNOSIS — K5900 Constipation, unspecified: Secondary | ICD-10-CM

## 2019-03-11 DIAGNOSIS — R195 Other fecal abnormalities: Secondary | ICD-10-CM | POA: Diagnosis not present

## 2019-03-11 LAB — CBC
HCT: 39.2 % (ref 39.0–52.0)
Hemoglobin: 12.7 g/dL — ABNORMAL LOW (ref 13.0–17.0)
MCHC: 32.3 g/dL (ref 30.0–36.0)
MCV: 81.7 fl (ref 78.0–100.0)
Platelets: 264 10*3/uL (ref 150.0–400.0)
RBC: 4.8 Mil/uL (ref 4.22–5.81)
RDW: 14.8 % (ref 11.5–15.5)
WBC: 7.9 10*3/uL (ref 4.0–10.5)

## 2019-03-11 LAB — IBC + FERRITIN
Ferritin: 45.4 ng/mL (ref 22.0–322.0)
Iron: 46 ug/dL (ref 42–165)
Saturation Ratios: 13.5 % — ABNORMAL LOW (ref 20.0–50.0)
Transferrin: 243 mg/dL (ref 212.0–360.0)

## 2019-03-11 MED ORDER — SODIUM CHLORIDE 0.9 % IV SOLN: 500.0000 mL | Freq: Once | INTRAVENOUS | Status: DC

## 2019-03-11 NOTE — Op Note (Signed)
[place] Patient Name: Benjamin Davidson Procedure Date: 03/11/2019 8:44 AM MRN: [id number] Endoscopist: Justice Britain , MD Age: 69 Referring MD:  Date of Birth: [birth year] Gender: Male Account #: [id number] Procedure:                Colonoscopy Indications:              Positive Cologuard test Medicines:                Monitored Anesthesia Care Procedure:                Pre-Anesthesia Assessment:                           - Prior to the procedure, a History and Physical                            was performed, and patient medications and                            allergies were reviewed. The patient's tolerance of                            previous anesthesia was also reviewed. The risks                            and benefits of the procedure and the sedation                            options and risks were discussed with the patient.                            All questions were answered, and informed consent                            was obtained. Prior Anticoagulants: The patient                            last took Plavix (clopidogrel) 5 days and Xarelto                            (rivaroxaban) 3 days prior to the procedure. ASA                            Grade Assessment: III - A patient with severe                            systemic disease. After reviewing the risks and                            benefits, the patient was deemed in satisfactory                            condition to undergo the procedure.  After obtaining informed consent, the colonoscope                            was passed under direct vision. Throughout the                            procedure, the patient's blood pressure, pulse, and                            oxygen saturations were monitored continuously. The                            Colonoscope was introduced through the anus and                            advanced to the the cecum, identified by                             appendiceal orifice and ileocecal valve. The                            patient tolerated the procedure. The colonoscopy                            was somewhat difficult due to significant looping.                            Successful completion of the procedure was aided by                            changing the patient's position, using manual                            pressure, withdrawing and reinserting the scope,                            straightening and shortening the scope to obtain                            bowel loop reduction and using scope torsion. The                            quality of the bowel preparation was adequate. The                            ileocecal valve, appendiceal orifice, and rectum                            were photographed. Scope In: 9:05:59 AM Scope Out: 9:40:45 AM Scope Withdrawal Time: 0 hours 28 minutes 37 seconds  Total Procedure Duration: 0 hours 34 minutes 46 seconds  Findings:                 The digital rectal exam findings include  hemorrhoids. Pertinent negatives include no                            palpable rectal lesions.                           The colon (entire examined portion) revealed                            moderately excessive looping.                           A 24 mm polyp was found in the hepatic flexure. The                            polyp was semi-sessile. Due to need for EMR and                            clipping as a result of Plavix/Xarelto use,                            decision made to not remove in the [place]. Area on                            contralateral wall was tattooed with an injection                            of Spot (carbon black).                           11 sessile polyps were found in the rectum (1),                            transverse colon (4), hepatic flexure (2),                            ascending colon (2) and cecum (2). The polyps were                             2 to 10 mm in size. These polyps were removed with                            a cold snare. Resection and retrieval were complete.                           Normal mucosa was found in the entire colon                            otherwise.                           Non-bleeding non-thrombosed internal hemorrhoids  were found during retroflexion, during perianal                            exam and during digital exam. The hemorrhoids were                            Grade II (internal hemorrhoids that prolapse but                            reduce spontaneously). Complications:            No immediate complications. Estimated Blood Loss:     Estimated blood loss was minimal. Impression:               - Hemorrhoids found on digital rectal exam.                           - There was significant looping of the colon.                           - One 24 mm polyp at the hepatic flexure. Polyp                            will require EMR and clipping and removal in                            hospital-based setting. Tattooed.                           - 11, 2 to 10 mm polyps in the rectum, in the                            transverse colon, at the hepatic flexure, in the                            ascending colon and in the cecum, removed with a                            cold snare. Resected and retrieved.                           - Normal mucosa in the entire examined colon.                           - Non-bleeding non-thrombosed internal hemorrhoids. Recommendation:           - The patient will be observed post-procedure,                            until all discharge criteria are met.                           - Discharge patient to home.                           -  Patient has a contact number available for                            emergencies. The signs and symptoms of potential                            delayed complications were  discussed with the                            patient. Return to normal activities tomorrow.                            Written discharge instructions were provided to the                            patient.                           - High fiber diet.                           - Use FiberCon 1 tablet PO daily.                           - Continue present medications.                           - May restart Xarelto in 48 hours (2/11 PM dose).                            May restart Plavix in 72 hours (2/12 AM dose).                            Hopefully will decrease risk of bleeding                            post-polypectomy.                           - Await pathology results.                           - Repeat colonoscopy within next 3 months for                            Colonoscopy with EMR in the Hospital based setting.                           - Also patient needs to obtain                            CBC/Iron/TIBC/Ferritin in coming weeks. If evidence                            of Iron deficiency then may also require EGD.                           -  The findings and recommendations were discussed                            with the patient.                           - The findings and recommendations were discussed                            with the patient's family. Justice Britain, MD 03/11/2019 9:51:54 AM

## 2019-03-11 NOTE — Progress Notes (Signed)
Pt tolerated well. VSS. Awake and to recovery. 

## 2019-03-11 NOTE — Progress Notes (Signed)
Pt's states no medical or surgical changes since previsit or office visit. 

## 2019-03-11 NOTE — Progress Notes (Signed)
Called to room to assist during endoscopic procedure.  Patient ID and intended procedure confirmed with present staff. Received instructions for my participation in the procedure from the performing physician.  

## 2019-03-11 NOTE — Patient Instructions (Signed)
HANDOUTS PROVIDED ON: HIGH FIBER DIET, POLYPS, & HEMORRHOIDS  The polyps removed today have been sent for pathology.  The results can take 1-3 weeks to receive.  When your next colonoscopy should occur will be based on the pathology results.    You may resume your previous diet, please begin using FiberCon 1 tablet daily.    You may resume your Xarelto on 2/11 evening dose and you may resume Plavix on 2/12 morning dose.  Thank you for allowing [place] to care for you today!!!  YOU HAD AN ENDOSCOPIC PROCEDURE TODAY AT [facility]:   Refer to the procedure report that was given to you for any specific questions about what was found during the examination.  If the procedure report does not answer your questions, please call your gastroenterologist to clarify.  If you requested that your care partner not be given the details of your procedure findings, then the procedure report has been included in a sealed envelope for you to review at your convenience later.  YOU SHOULD EXPECT: Some feelings of bloating in the abdomen. Passage of more gas than usual.  Walking can help get rid of the air that was put into your GI tract during the procedure and reduce the bloating. If you had a lower endoscopy (such as a colonoscopy or flexible sigmoidoscopy) you may notice spotting of blood in your stool or on the toilet paper. If you underwent a bowel prep for your procedure, you may not have a normal bowel movement for a few days.  Please Note:  You might notice some irritation and congestion in your nose or some drainage.  This is from the oxygen used during your procedure.  There is no need for concern and it should clear up in a day or so.  SYMPTOMS TO REPORT IMMEDIATELY:   Following lower endoscopy (colonoscopy or flexible sigmoidoscopy):  Excessive amounts of blood in the stool  Significant tenderness or worsening of abdominal pains  Swelling of the abdomen that is new, acute  Fever of 100F or  higher  For urgent or emergent issues, a gastroenterologist can be reached at any hour by calling [phone].   DIET:  We do recommend a small meal at first, but then you may proceed to your regular diet.  Drink plenty of fluids but you should avoid alcoholic beverages for 24 hours.  ACTIVITY:  You should plan to take it easy for the rest of today and you should NOT DRIVE or use heavy machinery until tomorrow (because of the sedation medicines used during the test).    FOLLOW UP: Our staff will call the number listed on your records 48-72 hours following your procedure to check on you and address any questions or concerns that you may have regarding the information given to you following your procedure. If we do not reach you, we will leave a message.  We will attempt to reach you two times.  During this call, we will ask if you have developed any symptoms of COVID 19. If you develop any symptoms (ie: fever, flu-like symptoms, shortness of breath, cough etc.) before then, please call [phone].  If you test positive for Covid 19 in the 2 weeks post procedure, please call and report this information to [place].    If any biopsies were taken you will be contacted by phone or by letter within the next 1-3 weeks.  Please call us at [phone] if you have not heard about the biopsies  in 3 weeks.    SIGNATURES/CONFIDENTIALITY: You and/or your care partner have signed paperwork which will be entered into your electronic medical record.  These signatures attest to the fact that that the information above on your After Visit Summary has been reviewed and is understood.  Full responsibility of the confidentiality of this discharge information lies with you and/or your care-partner.

## 2019-03-12 ENCOUNTER — Other Ambulatory Visit: Payer: Self-pay

## 2019-03-12 MED ORDER — FERROUS GLUCONATE 324 (38 FE) MG PO TABS: 324.0000 mg | ORAL_TABLET | Freq: Every day | ORAL | 0 refills | Status: DC

## 2019-03-13 ENCOUNTER — Telehealth: Payer: Self-pay

## 2019-03-13 ENCOUNTER — Telehealth: Payer: Self-pay | Admitting: *Deleted

## 2019-03-13 DIAGNOSIS — H43392 Other vitreous opacities, left eye: Secondary | ICD-10-CM | POA: Diagnosis not present

## 2019-03-13 DIAGNOSIS — H35033 Hypertensive retinopathy, bilateral: Secondary | ICD-10-CM | POA: Diagnosis not present

## 2019-03-13 DIAGNOSIS — E113513 Type 2 diabetes mellitus with proliferative diabetic retinopathy with macular edema, bilateral: Secondary | ICD-10-CM | POA: Diagnosis not present

## 2019-03-13 DIAGNOSIS — H35371 Puckering of macula, right eye: Secondary | ICD-10-CM | POA: Diagnosis not present

## 2019-03-13 DIAGNOSIS — E113511 Type 2 diabetes mellitus with proliferative diabetic retinopathy with macular edema, right eye: Secondary | ICD-10-CM | POA: Diagnosis not present

## 2019-03-13 NOTE — Telephone Encounter (Signed)
  Follow up Call-  Call back number 03/11/2019  Post procedure Call Back phone  # [phone]  Permission to leave phone message Yes  Some recent data might be hidden     Patient questions:  Do you have a fever, pain , or abdominal swelling? No. Pain Score  0 *  Have you tolerated food without any problems? Yes.    Have you been able to return to your normal activities? Yes.    Do you have any questions about your discharge instructions: Diet   No. Medications  No. Follow up visit  No.  Do you have questions or concerns about your Care? No.  Actions: * If pain score is 4 or above: No action needed, pain <4.   1. Have you developed a fever since your procedure? no  2.   Have you had an respiratory symptoms (SOB or cough) since your procedure? no  3.   Have you tested positive for COVID 19 since your procedure no  4.   Have you had any family members/close contacts diagnosed with the COVID 19 since your procedure?  no   If yes to any of these questions please route to Joylene John, RN and Alphonsa Gin, Therapist, sports.

## 2019-03-13 NOTE — Telephone Encounter (Signed)
No answer or VM on follow up call.

## 2019-03-15 ENCOUNTER — Encounter: Payer: Self-pay | Admitting: Gastroenterology

## 2019-03-26 ENCOUNTER — Telehealth: Payer: Self-pay | Admitting: Gastroenterology

## 2019-03-26 DIAGNOSIS — E1142 Type 2 diabetes mellitus with diabetic polyneuropathy: Secondary | ICD-10-CM | POA: Diagnosis not present

## 2019-03-26 NOTE — Telephone Encounter (Signed)
Attempted call to pt and line rings but no answer and no voice mail will attempt later

## 2019-03-26 NOTE — Telephone Encounter (Signed)
Pt stated that he has received result letter and would like to know plan of care--whether he can schedule hospital colon to remove large polyp.

## 2019-03-26 NOTE — Telephone Encounter (Signed)
The pt has an appt scheduled to see Dr Rush Landmark ono 4/21 at 130 pm to discuss colon EMR he has been advised

## 2019-04-09 DIAGNOSIS — H4051X4 Glaucoma secondary to other eye disorders, right eye, indeterminate stage: Secondary | ICD-10-CM | POA: Diagnosis not present

## 2019-04-17 DIAGNOSIS — E113513 Type 2 diabetes mellitus with proliferative diabetic retinopathy with macular edema, bilateral: Secondary | ICD-10-CM | POA: Diagnosis not present

## 2019-04-17 DIAGNOSIS — E113511 Type 2 diabetes mellitus with proliferative diabetic retinopathy with macular edema, right eye: Secondary | ICD-10-CM | POA: Diagnosis not present

## 2019-04-21 DIAGNOSIS — L851 Acquired keratosis [keratoderma] palmaris et plantaris: Secondary | ICD-10-CM | POA: Diagnosis not present

## 2019-04-21 DIAGNOSIS — Z794 Long term (current) use of insulin: Secondary | ICD-10-CM | POA: Diagnosis not present

## 2019-04-21 DIAGNOSIS — E1140 Type 2 diabetes mellitus with diabetic neuropathy, unspecified: Secondary | ICD-10-CM | POA: Diagnosis not present

## 2019-04-21 DIAGNOSIS — B351 Tinea unguium: Secondary | ICD-10-CM | POA: Diagnosis not present

## 2019-04-26 ENCOUNTER — Other Ambulatory Visit: Payer: Self-pay | Admitting: Gastroenterology

## 2019-05-21 ENCOUNTER — Encounter: Payer: Self-pay | Admitting: Gastroenterology

## 2019-05-21 ENCOUNTER — Ambulatory Visit: Payer: Medicare HMO | Admitting: Gastroenterology

## 2019-05-21 ENCOUNTER — Telehealth: Payer: Self-pay

## 2019-05-21 ENCOUNTER — Other Ambulatory Visit (INDEPENDENT_AMBULATORY_CARE_PROVIDER_SITE_OTHER): Payer: Medicare HMO

## 2019-05-21 VITALS — BP 126/64 | HR 70 | Temp 98.4°F | Ht 69.0 in | Wt 267.0 lb

## 2019-05-21 DIAGNOSIS — Z86010 Personal history of colonic polyps: Secondary | ICD-10-CM | POA: Diagnosis not present

## 2019-05-21 DIAGNOSIS — K635 Polyp of colon: Secondary | ICD-10-CM | POA: Diagnosis not present

## 2019-05-21 LAB — PROTIME-INR
INR: 1.3 ratio — ABNORMAL HIGH (ref 0.8–1.0)
Prothrombin Time: 14.3 s — ABNORMAL HIGH (ref 9.6–13.1)

## 2019-05-21 LAB — BASIC METABOLIC PANEL
BUN: 25 mg/dL — ABNORMAL HIGH (ref 6–23)
CO2: 29 mEq/L (ref 19–32)
Calcium: 9.4 mg/dL (ref 8.4–10.5)
Chloride: 102 mEq/L (ref 96–112)
Creatinine, Ser: 1.08 mg/dL (ref 0.40–1.50)
GFR: 68.12 mL/min (ref >60.00)
Glucose, Bld: 171 mg/dL — ABNORMAL HIGH (ref 70–99)
Potassium: 4.9 mEq/L (ref 3.5–5.1)
Sodium: 136 mEq/L (ref 135–145)

## 2019-05-21 NOTE — Patient Instructions (Signed)
If you are age 68 or older, your body mass index should be between 23-30. Your Body mass index is 39.43 kg/m. If this is out of the aforementioned range listed, please consider follow up with your Primary Care Provider.  If you are age 44 or [place], your body mass index should be between 19-25. Your Body mass index is 39.43 kg/m. If this is out of the aformentioned range listed, please consider follow up with your Primary Care Provider.   You have been scheduled for a colonoscopy. Please follow written instructions given to you at your visit today.  Please pick up your prep supplies at the pharmacy within the next 1-3 days. If you use inhalers (even only as needed), please bring them with you on the day of your procedure.  Your provider has requested that you go to the basement level for lab work before leaving today. Press "B" on the elevator. The lab is located at the first door on the left as you exit the elevator.  Due to recent changes in healthcare laws, you may see the results of your imaging and laboratory studies on MyChart before your provider has had a chance to review them.  We understand that in some cases there may be results that are confusing or concerning to you. Not all laboratory results come back in the same time frame and the provider may be waiting for multiple results in order to interpret others.  Please give [place] 48 hours in order for your provider to thoroughly review all the results before contacting the office for clarification of your results.   You will be contacted by our office prior to your procedure for directions on holding your Plavix.  If you do not hear from our office 1 week prior to your scheduled procedure, please call [phone] to discuss.   Hold your Iron 1 week before the colonoscopy   It was a pleasure to see you today!  Dr. Rush Landmark

## 2019-05-21 NOTE — Telephone Encounter (Signed)
[facility] Pre-operative Risk Assessment     Request for surgical clearance:     Endoscopy Procedure  What type of surgery is being performed?     Colonoscopy with EMR  When is this surgery scheduled?     06-18-2019  What type of clearance is required ?   Pharmacy  Are there any medications that need to be held prior to surgery and how long? Xarelto 2 days, Plavix 5 days  Practice name and name of physician performing surgery?      [facility]  What is your office phone and fax number?      Phone- [phone]  [phone]  Anesthesia type (None, local, MAC, general) ?       MAC

## 2019-05-22 DIAGNOSIS — E113513 Type 2 diabetes mellitus with proliferative diabetic retinopathy with macular edema, bilateral: Secondary | ICD-10-CM | POA: Diagnosis not present

## 2019-05-22 DIAGNOSIS — H35033 Hypertensive retinopathy, bilateral: Secondary | ICD-10-CM | POA: Diagnosis not present

## 2019-05-22 DIAGNOSIS — H35371 Puckering of macula, right eye: Secondary | ICD-10-CM | POA: Diagnosis not present

## 2019-05-22 DIAGNOSIS — E113511 Type 2 diabetes mellitus with proliferative diabetic retinopathy with macular edema, right eye: Secondary | ICD-10-CM | POA: Diagnosis not present

## 2019-05-22 LAB — CBC WITH DIFFERENTIAL/PLATELET
Basophils Absolute: 0.1 10*3/uL (ref 0.0–0.1)
Basophils Relative: 0.9 % (ref 0.0–3.0)
Eosinophils Absolute: 0.2 10*3/uL (ref 0.0–0.7)
Eosinophils Relative: 2.1 % (ref 0.0–5.0)
HCT: 38.7 % — ABNORMAL LOW (ref 39.0–52.0)
Hemoglobin: 12.8 g/dL — ABNORMAL LOW (ref 13.0–17.0)
Lymphocytes Relative: 22.7 % (ref 12.0–46.0)
Lymphs Abs: 1.8 10*3/uL (ref 0.7–4.0)
MCHC: 33 g/dL (ref 30.0–36.0)
MCV: 81.2 fl (ref 78.0–100.0)
Monocytes Absolute: 0.7 10*3/uL (ref 0.1–1.0)
Monocytes Relative: 8.7 % (ref 3.0–12.0)
Neutro Abs: 5.1 10*3/uL (ref 1.4–7.7)
Neutrophils Relative %: 65.6 % (ref 43.0–77.0)
Platelets: 245 10*3/uL (ref 150.0–400.0)
RBC: 4.76 Mil/uL (ref 4.22–5.81)
RDW: 15 % (ref 11.5–15.5)
WBC: 7.8 10*3/uL (ref 4.0–10.5)

## 2019-05-22 NOTE — Progress Notes (Signed)
[facility]   Primary Care Provider Venia Carbon, MD [place] [address] [phone]  Patient Profile: Benjamin Davidson is a 69 y.o. male with a pmh significant for CHF, peripheral vascular disease (on Plavix), CAD, diabetes, hypertension, hyperlipidemia, history of DVT (on anticoagulation), MDD/anxiety, adenomatous and serrated colon polyps.  The patient presents to the [facility] for an evaluation and management of problem(s) noted below:  Problem List 1. Polyp of hepatic flexure of colon   2. History of colonic polyps     History of Present Illness Please see initial consultation note for full details of HPI.  Interval History The patient returns for scheduled follow-up after his recent colonoscopy in February 2021 which was done for a positive FIT test had returned positive had shown multiple precancerous adenomas and serrated polyps.  He also had a very large hepatic flexure polyp for which we are here to discuss potential advanced resection.  The patient denies any significant changes in his bowel habits since our last discussion.  He has had no blood in his stool.  He did well after his last colonoscopy.  The patient is concerned about the size of this polyp and whether it can be removed without surgery.  GI Review of Systems Positive as above Negative for dysphagia, odynophagia, pain, nausea, vomiting, change in bowel habits  Review of Systems General: Denies fevers/chills/weight loss Cardiovascular: Denies chest pain/palpitations Pulmonary: Denies shortness of breath Gastroenterological: See HPI Genitourinary: Denies darkened urine Hematological: Denies easy bruising/bleeding Dermatological: Denies jaundice Psychological: Mood is stable   Medications Current Outpatient Medications  Medication Sig Dispense Refill  . aspirin EC 81 MG tablet Take 1 tablet (81 mg total) by mouth daily. 150  tablet 2  . carvedilol (COREG) 6.25 MG tablet Take 1 tablet (6.25 mg total) by mouth 2 (two) times daily with a meal. 180 tablet 3  . clopidogrel (PLAVIX) 75 MG tablet TAKE 1 TABLET BY MOUTH EVERY MORNING WITH BREAKFAST 90 tablet 3  . ferrous gluconate (FERGON) 324 MG tablet TAKE 1 TABLET BY MOUTH ONCE A DAY 30 tablet 0  . furosemide (LASIX) 20 MG tablet TAKE 1 TABLET BY MOUTH ONCE A DAY. MAY TAKE ONE ADDITIONAL TABLET IF INCREASED SWELLING. 180 tablet 3  . losartan (COZAAR) 25 MG tablet Take 1 tablet (25 mg total) by mouth daily. 90 tablet 3  . NOVOLIN 70/30 (70-30) 100 UNIT/ML injection INJECT 35 UNITS SUBCUTANEOUSLY TWICE A DAY AS DIRECTED 20 mL 11  . potassium chloride (K-DUR) 10 MEQ tablet Take 1 tablet (10 mEq total) by mouth daily. 90 tablet 3  . rosuvastatin (CRESTOR) 20 MG tablet Take 1 tablet (20 mg total) by mouth daily. 90 tablet 3  . sertraline (ZOLOFT) 100 MG tablet TAKE 1 TABLET BY MOUTH ONCE A DAY 90 tablet 3  . triamcinolone cream (KENALOG) 0.1 % Apply 1 application topically 2 (two) times daily as needed. 45 g 1  . TRUEPLUS INSULIN SYRINGE 31G X 5/16" 0.5 ML MISC USE AS DIRECTED TWICE DAILY 100 each 7  . XARELTO 10 MG TABS tablet TAKE 1 TABLET BY MOUTH ONCE A DAY 30 tablet 11   No current facility-administered medications for this visit.    Allergies Allergies  Allergen Reactions  . Losartan Other (See Comments)    Not an allergy -  Not an allergy -    . Lisinopril     Cough Flu like symptoms     Histories Past  Medical History:  Diagnosis Date  . Anemia    hx of  . Arthritis   . Asthma    childhood  . Cataract   . CHF (congestive heart failure) ([place])   . Coronary artery disease   . DM (diabetes mellitus), type 2, uncontrolled, periph vascular complic ([place])    Type II  . Edema    feet/legs  . Hypertension   . Kidney stones   . Left leg DVT ([place])    2000s  . MDD (recurrent major depressive disorder) in remission ([place])   . Peripheral vascular disease  ([facility])    Left toe amputations. Dr Lucky Cowboy  . Pure hypercholesterolemia   . Shortness of breath dyspnea   . Syncope    resolved   Past Surgical History:  Procedure Laterality Date  . AMPUTATION TOE Left 03/17/2017   Procedure: FIRST RAY RESECTION LEFT FOOT;  Surgeon: Sharlotte Alamo, DPM;  Location: [facility];  Service: Podiatry;  Laterality: Left;  . CARDIAC CATHETERIZATION    . CATARACT EXTRACTION W/PHACO Left 06/03/2015   Procedure: CATARACT EXTRACTION PHACO AND INTRAOCULAR LENS PLACEMENT (IOC);  Surgeon: Eulogio Bear, MD;  Location: [facility];  Service: Ophthalmology;  Laterality: Left;  Lot# [id number] H [place]: 1.06 AP%: 5.5 CDE: 3.69  . CATARACT EXTRACTION W/PHACO Right 12/10/2017   Procedure: Aborted phacoemusification of the right eye.  No intraocular lens was implated.;  Surgeon: Eulogio Bear, MD;  Location: [facility];  Service: Ophthalmology;  Laterality: Right;  Diabetic - insulin  . COLONOSCOPY    . CORONARY ARTERY BYPASS GRAFT  2008  . EYE SURGERY    . PARS PLANA VITRECTOMY Right 02/17/2015   Procedure: PARS PLANA VITRECTOMY WITH 25 GAUGE, endolaser, right eye;  Surgeon: Milus Height, MD;  Location: [facility];  Service: Ophthalmology;  Laterality: Right;  fluid pack lot # [id number] H endolaser: 200w, [id number]  . PERCUTANEOUS STENT INTERVENTION     right leg  . PERIPHERAL VASCULAR BALLOON ANGIOPLASTY Left 03/20/2017   Procedure: PERIPHERAL VASCULAR BALLOON ANGIOPLASTY;  Surgeon: Katha Cabal, MD;  Location: [facility];  Service: Cardiovascular;  Laterality: Left;  . PERIPHERALLY INSERTED CENTRAL CATHETER INSERTION  2013  . TOE AMPUTATION     left 5th, right 4th and 5th   Social History   Socioeconomic History  . Marital status: Divorced    Spouse name: Not on file  . Number of children: 0  . Years of education: Not on file  . Highest education level: Not on file  Occupational History  . Occupation: [name]    Comment: Disabled since 2000's    Tobacco Use  . Smoking status: Never Smoker  . Smokeless tobacco: Never Used  Substance and Sexual Activity  . Alcohol use: No    Alcohol/week: 0.0 standard drinks  . Drug use: No  . Sexual activity: Not on file  Other Topics Concern  . Not on file  Social History Narrative   Has living will   [name], sister, as health care POA.   Would accept resuscitation attempts   Not sure about tube feeds   Social Determinants of Health   Financial Resource Strain:   . Difficulty of Paying Living Expenses:   Food Insecurity:   . Worried About Charity fundraiser in the Last Year:   . Arboriculturist in the Last Year:   Transportation Needs:   . Film/video editor (Medical):   Marland Kitchen Lack of Transportation (Non-Medical):  Physical Activity:   . Days of Exercise per Week:   . Minutes of Exercise per Session:   Stress:   . Feeling of Stress :   Social Connections:   . Frequency of Communication with Friends and Family:   . Frequency of Social Gatherings with Friends and Family:   . Attends Religious Services:   . Active Member of Clubs or Organizations:   . Attends Archivist Meetings:   Marland Kitchen Marital Status:   Intimate Partner Violence:   . Fear of Current or Ex-Partner:   . Emotionally Abused:   Marland Kitchen Physically Abused:   . Sexually Abused:    Family History  Problem Relation Age of Onset  . Heart disease Sister   . Hyperlipidemia Brother   . Colon cancer Neg Hx   . Esophageal cancer Neg Hx   . Inflammatory bowel disease Neg Hx   . Liver disease Neg Hx   . Pancreatic cancer Neg Hx   . Rectal cancer Neg Hx   . Stomach cancer Neg Hx    I have reviewed his medical, social, and family history in detail and updated the electronic medical record as necessary.    PHYSICAL EXAMINATION  BP 126/64   Pulse 70   Temp 98.4 F (36.9 C)   Ht 5\' 9"  (1.753 m)   Wt 267 lb (121.1 kg)   SpO2 95%   BMI 39.43 kg/m  Wt Readings from Last 3 Encounters:  05/21/19 267 lb  (121.1 kg)  03/11/19 265 lb (120.2 kg)  02/25/19 265 lb (120.2 kg)  GEN: NAD, appears stated age, doesn't appear chronically ill PSYCH: Cooperative, without pressured speech EYE: Conjunctivae pink, sclerae anicteric ENT: MMM CV: Nontachycardic RESP: No audible wheezing GI: NABS, soft, protuberant abdomen, small ventral diastases, nontender, without rebound or guarding, no HSM appreciated MSK/EXT: Trace bilateral lower extremity edema SKIN: No jaundice NEURO:  Alert & Oriented x 3, no focal deficits   REVIEW OF DATA  I reviewed the following data at the time of this encounter:  GI Procedures and Studies  February 2021 colonoscopy - Hemorrhoids found on digital rectal exam. - There was significant looping of the colon. - One 24 mm polyp at the hepatic flexure. Polyp will require EMR and clipping and removal in hospital-based setting. Tattooed. - 11, 2 to 10 mm polyps in the rectum, in the transverse colon, at the hepatic flexure, in the ascending colon and in the cecum, removed with a cold snare. Resected and retrieved. - Normal mucosa in the entire examined colon. - Non-bleeding non-thrombosed internal hemorrhoids. Pathology Diagnosis Surgical [P], colon, cecal, ascending, transverse, hepatic flexure, rectal, polyp (11) - TUBULAR ADENOMA WITHOUT HIGH GRADE DYSPLASIA (X MULTIPLE). - SESSILE SERRATED POLYP WITHOUT CYTOLOGIC DYSPLASIA (X MULTIPLE).  Laboratory Studies  Reviewed those in epic  Imaging Studies  No relevant studies to review   ASSESSMENT  Benjamin Davidson is a 69 y.o. male with a pmh significant for CHF, peripheral vascular disease (on Plavix), CAD, diabetes, hypertension, hyperlipidemia, history of DVT (on anticoagulation), MDD/anxiety, adenomatous and serrated colon polyps.  The patient is seen today for evaluation and management of:  1. Polyp of hepatic flexure of colon   2. History of colonic polyps    As I performed the patient's last colonoscopy, I do feel  that it is reasonable to pursue an Advanced Polypectomy attempt of the polyp/lesion in the hepatic flexure.  We discussed some of the techniques of advanced polypectomy which include Endoscopic Mucosal Resection, OVESCO  Full-Thickness Resection, Endorotor Morcellation, and Tissue Ablation via Fulguration.  The risks and benefits of endoscopic evaluation were discussed with the patient; these include but are not limited to the risk of perforation, infection, bleeding, missed lesions, lack of diagnosis, severe illness requiring hospitalization, as well as anesthesia and sedation related illnesses.  During attempts at advanced resection, the risks of bleeding and perforation/leak are increased as opposed to diagnostic and screening procedures, and that was discussed with the patient as well.   In addition, I explained that with the possible need for piecemeal resection, subsequent short-interval endoscopic evaluation for follow up and potential retreatment of the lesion/area may be necessary.  I did offer, a referral to surgery in order for patient to have opportunity to discuss surgical management/intervention prior to finalizing decision for attempt at endoscopic removal, however, the patient deferred on this.  If, after attempt at removal of the polyp/lesion, it is found that the patient has a complication or that an invasive lesion or malignant lesion is found, or that the polyp/lesion continues to recur, the patient is aware and understands that surgery may still be indicated/required.  The patient also has evidence of iron deficiency and is currently taking iron.  We will repeat his blood count today.  I would like to add on an upper endoscopy to his evaluation which is reasonable.  We will get the okay from his providers to hold Plavix for 5 days and come off Xarelto for 2 days.  All patient questions were answered, to the best of my ability, and the patient agrees to the aforementioned plan of action with  follow-up as indicated.   PLAN  Plan to proceed with scheduling colonoscopy with EMR 90-minute slot Plan to proceed with diagnostic endoscopy for iron deficiency anemia evaluation Obtain approval for Plavix washout for 5 days and Xarelto washout for 2 days from prior providers as last procedure Continue oral iron for now but stop 1 week prior to colonoscopy and EGD   Orders Placed This Encounter  Procedures  . Procedural/ Surgical Case Request: COLONOSCOPY WITH PROPOFOL, ENDOSCOPIC MUCOSAL RESECTION  . CBC with Differential/Platelet  . Basic metabolic panel  . Protime-INR  . Ambulatory referral to Gastroenterology    New Prescriptions   No medications on file   Modified Medications   No medications on file    Planned Follow Up No follow-ups on file.   Total Time in Face-to-Face and in Coordination of Care for patient including independent/personal interpretation/review of prior testing, medical history, examination, medication adjustment, communicating results with the patient directly, and documentation with the EHR is 25 minutes.   Justice Britain, MD [facility] Advanced Endoscopy Office # [phone]

## 2019-05-22 NOTE — Telephone Encounter (Addendum)
   Primary Cardiologist: Ida Rogue, MD  Chart reviewed as part of pre-operative protocol coverage. Given past medical history and time since last visit, based on ACC/AHA guidelines, Benjamin Davidson would be at acceptable risk for the planned procedure without further cardiovascular testing.   He may hold his Xarelto for 2 days prior to his colonoscopy and resume as soon as possible after the procedure.  I will route this recommendation to the requesting party via Epic fax function and remove from pre-op pool.  Please call with questions.  Jossie Ng. Aron Inge NP-C    05/22/2019, 1:12 PM [facility] [place] [address] Office [phone] Fax [phone]

## 2019-05-22 NOTE — Telephone Encounter (Signed)
What about the patients Plavix? We need to hold for 5 days before the procedure also. Please advise as he is on both.

## 2019-05-22 NOTE — Telephone Encounter (Signed)
Okay to hold xarelto for 2 days prior to colonoscopy and resume as soon after as possible

## 2019-05-22 NOTE — Telephone Encounter (Signed)
Patient with diagnosis of DVT on Xarelto for anticoagulation.    Procedure: Colonoscopy with EMR Date of procedure: 06/18/2019  Patients Xarelto is managed by PCP, Dr. Silvio Pate. I will defer to Dr. Silvio Pate.

## 2019-05-23 ENCOUNTER — Encounter: Payer: Self-pay | Admitting: Gastroenterology

## 2019-05-28 NOTE — Telephone Encounter (Addendum)
   Primary Cardiologist:Timothy Rockey Situ, MD  Chart reviewed as part of pre-operative protocol coverage. I see that partial clearance has been addressed but pending input from MD. However, because of Benjamin Davidson's past medical history and time since last visit, he/she will require a follow-up visit in order to better assess preoperative cardiovascular risk. His last in-person visit was February of 2019, only seen via tele-medicine in 05/2018. By the time procedure is scheduled for it will have been over 1 year since last OV. Therefore he is best served with updated assessment to make best clinical decision with enough advanced time to hold antiplatelet if needed especially given medical complexity.  Pre-op covering staff: - Please schedule appointment and call patient to inform them. - Please contact requesting surgeon's office via preferred method (i.e, phone, fax) to inform them of need for appointment prior to surgery.   Charlie Pitter, PA-C  05/28/2019, 10:25 AM

## 2019-05-28 NOTE — Telephone Encounter (Signed)
Okay to hold Xarelto 2 days before procedure Hold Plavix 5 days before procedure Restart these medications after procedure  Would take aspirin 81 mg daily when he is not taking his Plavix for 5 days before procedure Stop aspirin when he restarts his Plavix

## 2019-05-28 NOTE — Telephone Encounter (Addendum)
Thank you! Since patient has not been physically seen in office since 03/2017 (only telemed 2020), just wanted to clarify if this reply meant you were OK sending off medication recommendations without updated visit or if you felt it was best to have appointment before finalizing this clearance? (By the time procedure is scheduled it will also have been over a year since tele-med visit.)  Brookelyn Gaynor PA-C

## 2019-05-28 NOTE — Telephone Encounter (Signed)
Pt has appt with Dr. Rockey Situ 06/16/19 for pre op clearance. I will forward clearance notes to MD for upcoming appt.I will send FYI to [facility] pt has appt 06/16/19. I will remove from the pre op cal back pool.

## 2019-05-30 IMAGING — DX DG FOOT 2V*L*
2 series · 2 of 2 positions shown · non-contrast
Comparison: Left foot x-rays dated March 08, 2017.

CLINICAL DATA: Evaluate for osteomyelitis.

EXAM:
LEFT FOOT - 2 VIEW

[foot ap]
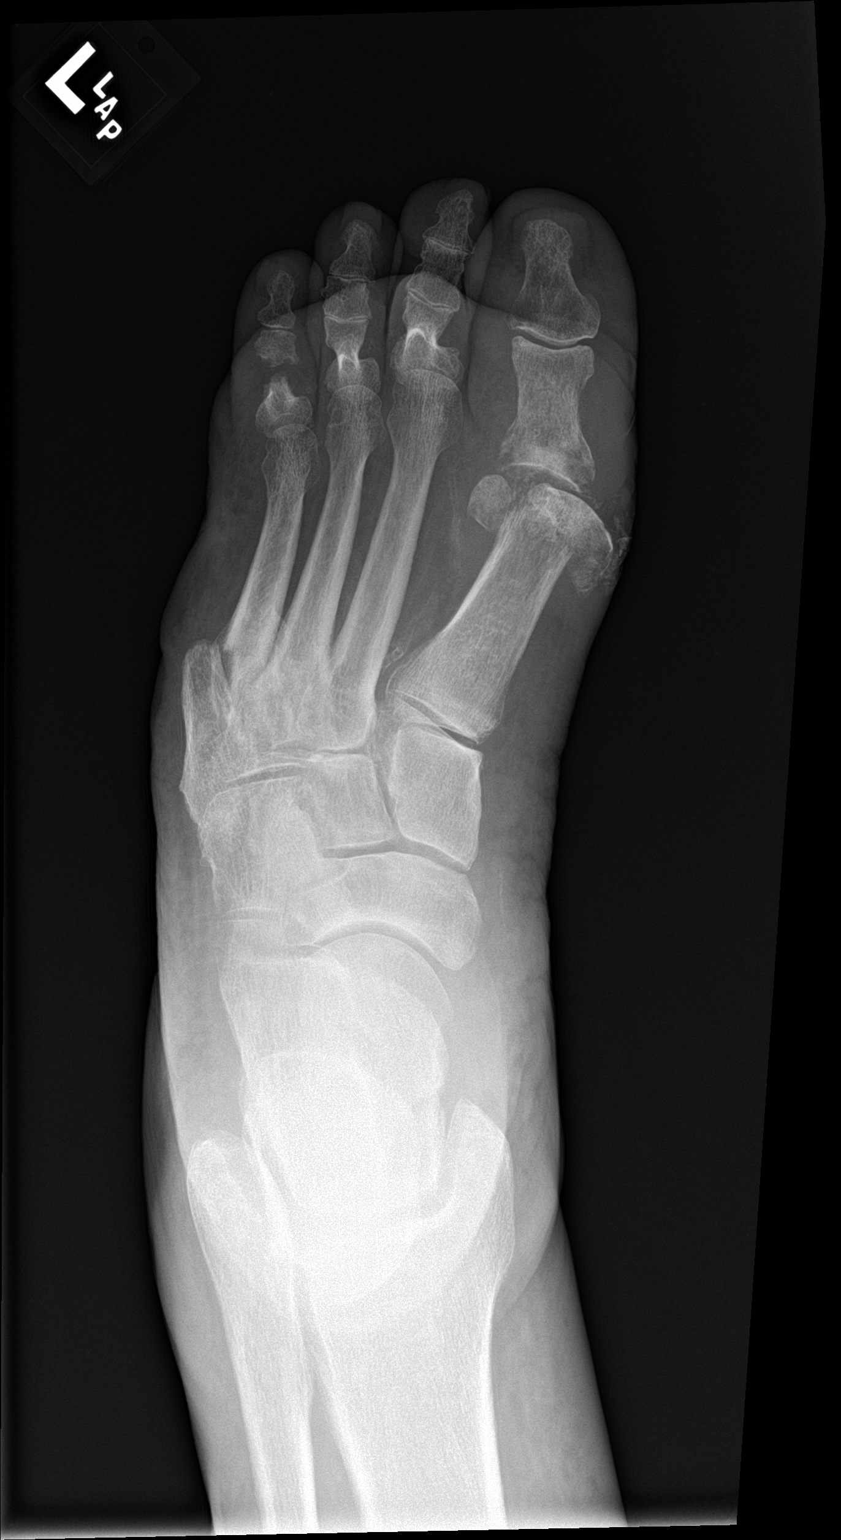

[foot lat]
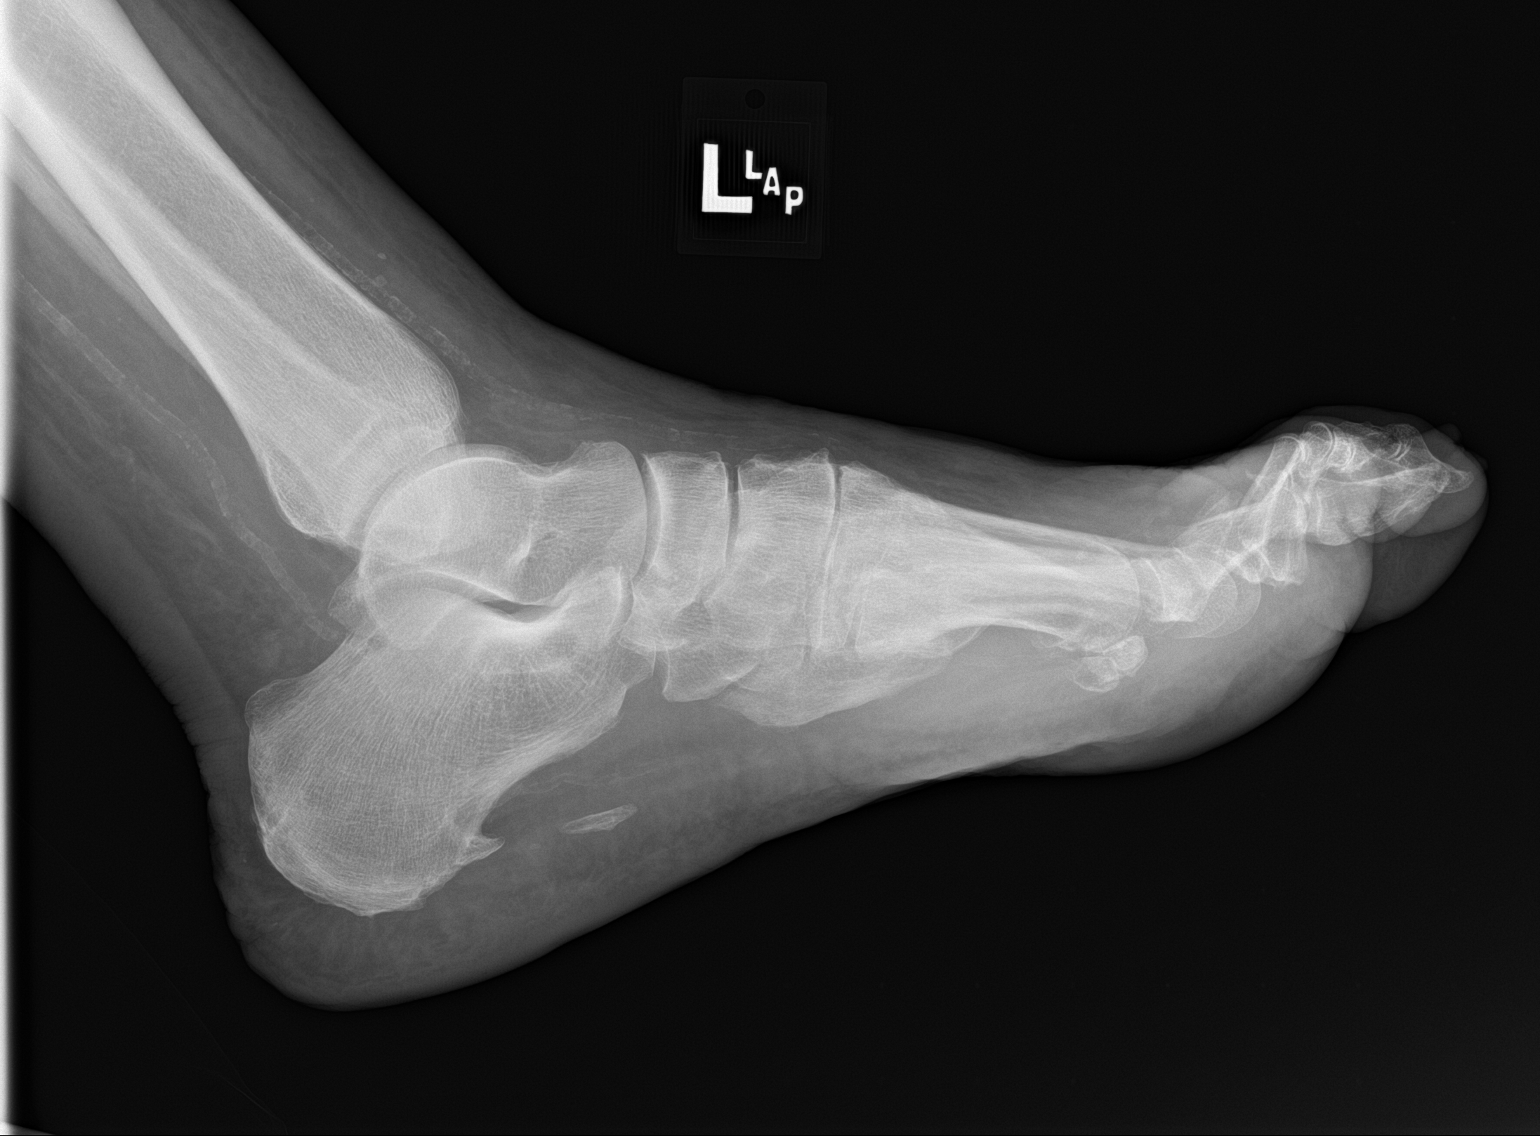

[2 of 2 positions shown; findings below may reference images not displayed]

FINDINGS: Ulceration along the medial aspect of the first MTP joint again
noted. New fracture of the first metatarsal head with 7 mm medial
displacement. There is new osteolysis and irregularity of the base
of the first proximal phalanx. Postsurgical changes related to prior
fifth transmetatarsal amputation. Unchanged healed fourth proximal
metatarsal shaft fracture. Unchanged fourth PIP joint destruction.
No subcutaneous emphysema.
IMPRESSION: 1. Osteomyelitis about the first MTP joint with new displaced
pathologic fracture of the first metatarsal head.

## 2019-05-30 NOTE — Telephone Encounter (Signed)
Left a message to return call.  

## 2019-06-01 NOTE — Telephone Encounter (Signed)
Can we stressed to him the importance of showing up for his appointment 06/16/19 This will help with preop

## 2019-06-02 NOTE — Telephone Encounter (Signed)
Left voicemail message to call back to review upcoming appointment for his cardiac clearance.

## 2019-06-02 NOTE — Telephone Encounter (Signed)
Spoke with patient and confirmed his upcoming appointment with provider in order to get his clearance for upcoming colonoscopy. Stressed importance of keeping that appointment so they will be able to proceed with his procedure that week. He verbalized understanding and confirmed date and time. No further questions at this time.

## 2019-06-02 NOTE — Telephone Encounter (Signed)
Pt informed okay to hold Xarelto 2 days prior to procedure. Hold Plavix 5 days before procedure. Restart these medications after procedure.

## 2019-06-10 DIAGNOSIS — E1142 Type 2 diabetes mellitus with diabetic polyneuropathy: Secondary | ICD-10-CM | POA: Diagnosis not present

## 2019-06-13 ENCOUNTER — Other Ambulatory Visit (HOSPITAL_COMMUNITY): Payer: Medicare HMO

## 2019-06-14 ENCOUNTER — Other Ambulatory Visit (HOSPITAL_COMMUNITY): Payer: Medicare HMO

## 2019-06-15 NOTE — Progress Notes (Signed)
Date:  06/16/2019   ID:  Benjamin Davidson, DOB July 30, 1950, MRN [id number]  Patient Location:  [address] [place] [id number]   Provider location:   [facility], [place] office  PCP:  Benjamin Carbon, MD  Cardiologist:  Benjamin Davidson [facility]  Chief Complaint  Patient presents with  . office visit    Pt has no complaints. Meds verbally reviewed w/ pt.     History of Present Illness:    Benjamin Davidson is a 69 y.o. 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,   diabetes,  sedentary lifestyle,  history of DVT in the left leg, on xarelto who presents for routine followup of his coronary artery disease, peripheral arterial disease  Irritated front door of medical arts blocked Blood pressure higher on her measurements today  Weight up 20 pounds in 2 years 2019 to 2021 Poor diet, less exercise Chronic mild shortness of breath on exertion  More recently has been walking on the track 2-3 times Lives by himself, does ADLs  Labs reviewed HBA1C 8.0, up from 6.9 Was eating Sweet potato pie Changed diet  CR 1.08 Total chol 111 LDL 52  Previous foot ulcers, healed  Had several eye surgeries  EKG personally reviewed by myself on todays visit NSR rate 72, LBBB  Previous records reviewed  hospital admission February 2019 Left great toe osteomyelitis with ulceration Amputation left great toe,  blister that never healed  Vascular surgery March 20, 2017 Angioplasty of left posterior tibial Angioplasty left peroneal artery  History of osteomyelitis requiring wound VAC on his Davidson foot, 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 He had gangrene of the fifth toe on the Davidson with acute renal failure and dehydration admitted 08/07/2011   Prior CV studies:   The following studies were reviewed today:  No recent stress test No recent catheterization  last  stress test July 2008 showing mild ischemia in the mid anteroseptal, apical anterior and apical septal region.   Ejection fraction 40%. This test was done post bypass. No cardiac catheterization was done in followup as he had no symptoms.  Past Medical History:  Diagnosis Date  . Anemia    hx of  . Arthritis   . Asthma    childhood  . Cataract   . CHF (congestive heart failure) ([place])   . Coronary artery disease   . DM (diabetes mellitus), type 2, uncontrolled, periph vascular complic ([place])    Type II  . Edema    feet/legs  . Hypertension   . Kidney stones   . Left leg DVT ([place])    2000s  . MDD (recurrent major depressive disorder) in remission ([place])   . Peripheral vascular disease (HCC)    Left toe amputations. Dr Lucky Cowboy  . Pure hypercholesterolemia   . Shortness of breath dyspnea   . Syncope    resolved   Past Surgical History:  Procedure Laterality Date  . AMPUTATION TOE Left 03/17/2017   Procedure: FIRST RAY RESECTION LEFT FOOT;  Surgeon: Sharlotte Alamo, DPM;  Location: [facility];  Service: Podiatry;  Laterality: Left;  . CARDIAC CATHETERIZATION    . CATARACT EXTRACTION W/PHACO Left 06/03/2015   Procedure: CATARACT EXTRACTION PHACO AND INTRAOCULAR LENS PLACEMENT (IOC);  Surgeon: Eulogio Bear, MD;  Location: [facility];  Service: Ophthalmology;  Laterality: Left;  Lot# [id number] H [place]: 1.06 AP%: 5.5  CDE: 3.69  . CATARACT EXTRACTION W/PHACO Davidson 12/10/2017   Procedure: Aborted phacoemusification of the Davidson eye.  No intraocular lens was implated.;  Surgeon: Eulogio Bear, MD;  Location: [place];  Service: Ophthalmology;  Laterality: Davidson;  Diabetic - insulin  . COLONOSCOPY    . CORONARY ARTERY BYPASS GRAFT  2008  . EYE SURGERY    . PARS PLANA VITRECTOMY Davidson 02/17/2015   Procedure: PARS PLANA VITRECTOMY WITH 25 GAUGE, endolaser, Davidson eye;  Surgeon: Milus Height, MD;  Location: [facility];  Service: Ophthalmology;  Laterality: Davidson;  fluid pack lot #  [id number] H endolaser: 200w, [id number]  . PERCUTANEOUS STENT INTERVENTION     Davidson leg  . PERIPHERAL VASCULAR BALLOON ANGIOPLASTY Left 03/20/2017   Procedure: PERIPHERAL VASCULAR BALLOON ANGIOPLASTY;  Surgeon: Katha Cabal, MD;  Location: [place] [facility];  Service: Cardiovascular;  Laterality: Left;  . PERIPHERALLY INSERTED CENTRAL CATHETER INSERTION  2013  . TOE AMPUTATION     left 5th, Davidson 4th and 5th     Current Meds  Medication Sig  . aspirin EC 81 MG tablet Take 1 tablet (81 mg total) by mouth daily.  . carvedilol (COREG) 6.25 MG tablet Take 1 tablet (6.25 mg total) by mouth 2 (two) times daily with a meal.  . clopidogrel (PLAVIX) 75 MG tablet TAKE 1 TABLET BY MOUTH EVERY MORNING WITH BREAKFAST  . ferrous gluconate (FERGON) 324 MG tablet TAKE 1 TABLET BY MOUTH ONCE A DAY  . furosemide (LASIX) 20 MG tablet TAKE 1 TABLET BY MOUTH ONCE A DAY. MAY TAKE ONE ADDITIONAL TABLET IF INCREASED SWELLING.  Marland Kitchen losartan (COZAAR) 25 MG tablet Take 1 tablet (25 mg total) by mouth daily.  Marland Kitchen NOVOLIN 70/30 (70-30) 100 UNIT/ML injection INJECT 35 UNITS SUBCUTANEOUSLY TWICE A DAY AS DIRECTED (Patient taking differently: Inject 35 Units into the skin 2 (two) times daily with a meal. )  . potassium chloride (K-DUR) 10 MEQ tablet Take 1 tablet (10 mEq total) by mouth daily.  . rosuvastatin (CRESTOR) 20 MG tablet Take 1 tablet (20 mg total) by mouth daily.  . sertraline (ZOLOFT) 100 MG tablet TAKE 1 TABLET BY MOUTH ONCE A DAY  . triamcinolone cream (KENALOG) 0.1 % Apply 1 application topically 2 (two) times daily as needed. (Patient taking differently: Apply 1 application topically 2 (two) times daily as needed (irritation). )  . TRUEPLUS INSULIN SYRINGE 31G X 5/16" 0.5 ML MISC USE AS DIRECTED TWICE DAILY  . XARELTO 10 MG TABS tablet TAKE 1 TABLET BY MOUTH ONCE A DAY     Allergies:   Losartan and Lisinopril   Social History   Tobacco Use  . Smoking status: Never Smoker  . Smokeless tobacco: Never  Used  Substance Use Topics  . Alcohol use: No    Alcohol/week: 0.0 standard drinks  . Drug use: No     Current Outpatient Medications on File Prior to Visit  Medication Sig Dispense Refill  . aspirin EC 81 MG tablet Take 1 tablet (81 mg total) by mouth daily. 150 tablet 2  . carvedilol (COREG) 6.25 MG tablet Take 1 tablet (6.25 mg total) by mouth 2 (two) times daily with a meal. 180 tablet 3  . clopidogrel (PLAVIX) 75 MG tablet TAKE 1 TABLET BY MOUTH EVERY MORNING WITH BREAKFAST 90 tablet 3  . ferrous gluconate (FERGON) 324 MG tablet TAKE 1 TABLET BY MOUTH ONCE A DAY 30 tablet 0  . furosemide (LASIX) 20 MG tablet TAKE 1 TABLET BY  MOUTH ONCE A DAY. MAY TAKE ONE ADDITIONAL TABLET IF INCREASED SWELLING. 180 tablet 3  . losartan (COZAAR) 25 MG tablet Take 1 tablet (25 mg total) by mouth daily. 90 tablet 3  . NOVOLIN 70/30 (70-30) 100 UNIT/ML injection INJECT 35 UNITS SUBCUTANEOUSLY TWICE A DAY AS DIRECTED (Patient taking differently: Inject 35 Units into the skin 2 (two) times daily with a meal. ) 20 mL 11  . potassium chloride (K-DUR) 10 MEQ tablet Take 1 tablet (10 mEq total) by mouth daily. 90 tablet 3  . rosuvastatin (CRESTOR) 20 MG tablet Take 1 tablet (20 mg total) by mouth daily. 90 tablet 3  . sertraline (ZOLOFT) 100 MG tablet TAKE 1 TABLET BY MOUTH ONCE A DAY 90 tablet 3  . triamcinolone cream (KENALOG) 0.1 % Apply 1 application topically 2 (two) times daily as needed. (Patient taking differently: Apply 1 application topically 2 (two) times daily as needed (irritation). ) 45 g 1  . TRUEPLUS INSULIN SYRINGE 31G X 5/16" 0.5 ML MISC USE AS DIRECTED TWICE DAILY 100 each 7  . XARELTO 10 MG TABS tablet TAKE 1 TABLET BY MOUTH ONCE A DAY 30 tablet 11   No current facility-administered medications on file prior to visit.     Family Hx: The patient's family history includes Heart disease in his sister; Hyperlipidemia in his brother. There is no history of Colon cancer, Esophageal cancer,  Inflammatory bowel disease, Liver disease, Pancreatic cancer, Rectal cancer, or Stomach cancer.  ROS:   Please see the history of present illness.    Review of Systems  Constitutional: Negative.   HENT: Negative.   Respiratory: Negative.   Cardiovascular: Negative.   Gastrointestinal: Negative.   Musculoskeletal: Negative.   Neurological: Negative.   Psychiatric/Behavioral: Negative.   All other systems reviewed and are negative.    Labs/Other Tests and Data Reviewed:    Recent Labs: 08/05/2018: ALT 22 05/21/2019: BUN 25; Creatinine, Ser 1.08; Hemoglobin 12.8; Platelets 245.0; Potassium 4.9; Sodium 136   Recent Lipid Panel Lab Results  Component Value Date/Time   CHOL 111 08/05/2018 03:52 PM   TRIG 123.0 08/05/2018 03:52 PM   HDL 34.20 (L) 08/05/2018 03:52 PM   CHOLHDL 3 08/05/2018 03:52 PM   LDLCALC 52 08/05/2018 03:52 PM    Wt Readings from Last 3 Encounters:  06/16/19 263 lb 6 oz (119.5 kg)  05/21/19 267 lb (121.1 kg)  03/11/19 265 lb (120.2 kg)     Exam:    BP (!) 150/70 (BP Location: Left Arm, Patient Position: Sitting, Cuff Size: Large)   Pulse 72   Ht 5' 9.5" (1.765 m)   Wt 263 lb 6 oz (119.5 kg)   SpO2 96%   BMI 38.34 kg/m   Constitutional:  oriented to person, place, and time. No distress.  HENT:  Head: Grossly normal Eyes:  no discharge. No scleral icterus.  Neck: No JVD, no carotid bruits  Cardiovascular: Regular rate and rhythm, no murmurs appreciated Pulmonary/Chest: Clear to auscultation bilaterally, no wheezes or rails Abdominal: Soft.  no distension.  no tenderness.  Musculoskeletal: Normal range of motion Neurological:  normal muscle tone. Coordination normal. No atrophy Skin: Skin warm and dry Psychiatric: normal affect, pleasant   ASSESSMENT & PLAN:    PAD (peripheral artery disease) (HCC) Previously followed by vascular, dr. Ronalee Belts No claudication symptoms Stable  Morbid obesity ([place]) Weight markedly elevated in the past 2 years,  recommend he continue his walking program, dietary changes  Type 2 diabetes, controlled, with peripheral neuropathy (  [place]) Was Eating poorly, lots of sweet potato pie Walking more Last HBA1c 8, up from 6.9 Weight much higher Discussed with him as above  Pure hypercholesterolemia Cholesterol is at goal on the current lipid regimen. No changes to the medications were made.  Essential hypertension Elevated today, well controlled with PMD Upset, building door closed, had to walk around  Atherosclerosis of native coronary artery of native heart with stable angina pectoris ([place]) Currently with no symptoms of angina. No further workup at this time. Continue current medication regimen.    Total encounter time more than 25 minutes  Greater than 50% was spent in counseling and coordination of care with the patient   F/u one year  Signed, Ida Rogue, MD  06/16/2019 6:22 PM    [place] [facility] [address], [place],  [address]

## 2019-06-16 ENCOUNTER — Ambulatory Visit (INDEPENDENT_AMBULATORY_CARE_PROVIDER_SITE_OTHER): Payer: Medicare HMO | Admitting: Cardiovascular Disease

## 2019-06-16 ENCOUNTER — Other Ambulatory Visit: Payer: Self-pay

## 2019-06-16 ENCOUNTER — Encounter: Payer: Self-pay | Admitting: Cardiovascular Disease

## 2019-06-16 ENCOUNTER — Telehealth: Payer: Self-pay | Admitting: Gastroenterology

## 2019-06-16 ENCOUNTER — Other Ambulatory Visit (HOSPITAL_COMMUNITY)
Admission: RE | Admit: 2019-06-16 | Discharge: 2019-06-16 | Disposition: A | Discharge status: Home / Self Care | Payer: Medicare HMO | Source: Ambulatory Visit | Attending: Gastroenterology | Admitting: Gastroenterology

## 2019-06-16 VITALS — BP 150/70 | HR 72 | Ht 69.5 in | Wt 263.4 lb

## 2019-06-16 DIAGNOSIS — I25118 Atherosclerotic heart disease of native coronary artery with other forms of angina pectoris: Secondary | ICD-10-CM | POA: Diagnosis not present

## 2019-06-16 DIAGNOSIS — Z01812 Encounter for preprocedural laboratory examination: Secondary | ICD-10-CM | POA: Diagnosis not present

## 2019-06-16 DIAGNOSIS — E7800 Pure hypercholesterolemia, unspecified: Secondary | ICD-10-CM | POA: Diagnosis not present

## 2019-06-16 DIAGNOSIS — I10 Essential (primary) hypertension: Secondary | ICD-10-CM

## 2019-06-16 DIAGNOSIS — I739 Peripheral vascular disease, unspecified: Secondary | ICD-10-CM

## 2019-06-16 DIAGNOSIS — E1142 Type 2 diabetes mellitus with diabetic polyneuropathy: Secondary | ICD-10-CM

## 2019-06-16 DIAGNOSIS — Z20822 Contact with and (suspected) exposure to covid-19: Secondary | ICD-10-CM | POA: Insufficient documentation

## 2019-06-16 LAB — SARS CORONAVIRUS 2 (TAT 6-24 HRS): SARS Coronavirus 2: NEGATIVE

## 2019-06-16 NOTE — Patient Instructions (Addendum)
Medication Instructions:  For colonoscopy: Hold the xarelto 2 days prior Hold plavix 5 days prior Stay on asa  Restart everything after the procedure  If you need a refill on your cardiac medications before your next appointment, please call your pharmacy.    Lab work: No new labs needed   If you have labs (blood work) drawn today and your tests are completely normal, you will receive your results only by: Marland Kitchen MyChart Message (if you have MyChart) OR . A paper copy in the mail If you have any lab test that is abnormal or we need to change your treatment, we will call you to review the results.   Testing/Procedures: No new testing needed   Follow-Up: At [facility], you and your health needs are our priority.  As part of our continuing mission to provide you with exceptional heart care, we have created designated Provider Care Teams.  These Care Teams include your primary Cardiologist (physician) and Advanced Practice Providers (APPs -  Physician Assistants and Nurse Practitioners) who all work together to provide you with the care you need, when you need it.  . You will need a follow up appointment in 12 months   . Providers on your designated Care Team:   . Murray Hodgkins, NP . Christell Faith, PA-C . Marrianne Mood, PA-C  Any Other Special Instructions Will Be Listed Below (If Applicable).  For educational health videos Log in to : [email or web address] Or : [email or web address], password : [email or web address]

## 2019-06-17 ENCOUNTER — Encounter (HOSPITAL_COMMUNITY): Payer: Self-pay | Admitting: Gastroenterology

## 2019-06-17 ENCOUNTER — Other Ambulatory Visit: Payer: Self-pay

## 2019-06-17 MED ORDER — NA SULFATE-K SULFATE-MG SULF 17.5-3.13-1.6 GM/177ML PO SOLN: 1.0000 | Freq: Once | ORAL | 0 refills | Status: AC

## 2019-06-17 NOTE — Anesthesia Preprocedure Evaluation (Addendum)
Anesthesia Evaluation  Patient identified by MRN, date of birth, ID band Patient awake    Reviewed: Allergy & Precautions, H&P , NPO status , Patient's Chart, lab work & pertinent test results  Airway Mallampati: III  TM Distance: >3 FB Neck ROM: Full    Dental no notable dental hx. (+) Teeth Intact, Dental Advisory Given   Pulmonary asthma ,    Pulmonary exam normal breath sounds clear to auscultation       Cardiovascular Exercise Tolerance: Good hypertension, Pt. on medications and Pt. on home beta blockers + CAD, + CABG, + Peripheral Vascular Disease and +CHF   Rhythm:Regular Rate:Normal     Neuro/Psych Depression negative neurological ROS     GI/Hepatic negative GI ROS, Neg liver ROS,   Endo/Other  diabetes, Insulin DependentMorbid obesity  Renal/GU negative Renal ROS  negative genitourinary   Musculoskeletal  (+) Arthritis , Osteoarthritis,    Abdominal   Peds  Hematology  (+) Blood dyscrasia, anemia ,   Anesthesia Other Findings   Reproductive/Obstetrics negative OB ROS                           Anesthesia Physical Anesthesia Plan  ASA: III  Anesthesia Plan: MAC   Post-op Pain Management:    Induction: Intravenous  PONV Risk Score and Plan: 1 and Propofol infusion  Airway Management Planned: Nasal Cannula  Additional Equipment:   Intra-op Plan:   Post-operative Plan:   Informed Consent: I have reviewed the patients History and Physical, chart, labs and discussed the procedure including the risks, benefits and alternatives for the proposed anesthesia with the patient or authorized representative who has indicated his/her understanding and acceptance.     Dental advisory given  Plan Discussed with: CRNA  Anesthesia Plan Comments:         Anesthesia Quick Evaluation

## 2019-06-17 NOTE — Progress Notes (Signed)
Pt denies SOB and chest pain. Pt stated that he is under the care of Dr. Rockey Situ, Cardiology and Dr. Viviana Simpler, PCP. Pt denies having a chest x ray in the last year. Pt denies recent labs.  Pt denies recent labs. Pt stated " I took my last dose of Plavix " Friday or Saturday." Pt stated  " I took my last dose of Xarelto 2-3 days ago. "  Pt made aware to stop taking vitamins, fish oil and herbal medications. Do not take any NSAIDs ie: Ibuprofen, Advil, Naproxen (Aleve), Motrin, BC and Goody Powder. Pt made aware to take no Novolin insulin on DOS. Pt made aware to check CBG every 2 hours prior to arrival to hospital on DOS. Pt made aware to treat a CBG < 70 with 4 ounces of apple juice, wait 15 minutes after intervention to recheck CBG, if CBG remains < 70, call Endoscopy unit to speak with a nurse. Pt reminded to quarantine. Pt verbalized understanding of all pre-op instructions. PA, Anesthesiology, asked to review pt history.

## 2019-06-17 NOTE — Progress Notes (Signed)
Anesthesia Chart Review: SAME DAY WORK-UP (ENDO)  Case: [id number] Date/Time: 06/18/19 0900   Procedures:      COLONOSCOPY WITH PROPOFOL (N/A )     ENDOSCOPIC MUCOSAL RESECTION (N/A )     ESOPHAGOGASTRODUODENOSCOPY (EGD) WITH PROPOFOL (N/A )   Anesthesia type: Monitor Anesthesia Care   Pre-op diagnosis: hx of colon polyps, IRON DEFICIENCY ANEMIA   Location: [facility]   Surgeons: Mansouraty, Telford Nab., MD      DISCUSSION: Patient is a 69 year old male scheduled for the above procedure.  History includes never smoker, CAD (CABG 03/2006), CHF, hypercholesterolemia, HTN, PVD (PTA right ATA 08/16/11; left great toe amputation 03/17/17; left PTA, left peroneal artery stents 03/20/17), DM2, syncope ("resolved"), dyspnea, DVT (LLE 2000's), PE (09/05/05), anemia, depression, obesity. Moderate-severe mitral regurgitation was documented on 07/2011 echo done during osteomyelitis admission.   Last evaluation with cardiologist Dr. Rockey Situ on 06/16/2019.  No murmurs documented on exam. CAD and PVD felt stable at that time.  1 year follow-up recommended. Previous telephone encounters indicate: "Okay to hold Xarelto 2 days before procedure Hold Plavix 5 days before procedure Restart these medications after procedure  Would take aspirin 81 mg daily when he is not taking his Plavix for 5 days before procedure Stop aspirin when he restarts his Plavix".  06/16/2019 preprocedure COVID-19 test was negative.  Anesthesia team to evaluate on the day of procedure.   VS:   Wt Readings from Last 3 Encounters:  06/16/19 119.5 kg  05/21/19 121.1 kg  03/11/19 120.2 kg   BP Readings from Last 3 Encounters:  06/16/19 (!) 150/70  05/21/19 126/64  03/11/19 (!) 143/59   Pulse Readings from Last 3 Encounters:  06/16/19 72  05/21/19 70  03/11/19 67    PROVIDERS: Venia Carbon, MD is PCP Ida Rogue, MD is cardiologist   LABS: Per GI. As of 05/21/19, results include: Lab Results  Component  Value Date   WBC 7.8 05/21/2019   HGB 12.8 (L) 05/21/2019   HCT 38.7 (L) 05/21/2019   PLT 245.0 05/21/2019   GLUCOSE 171 (H) 05/21/2019   ALT 22 08/05/2018   AST 24 08/05/2018   NA 136 05/21/2019   K 4.9 05/21/2019   CL 102 05/21/2019   CREATININE 1.08 05/21/2019   BUN 25 (H) 05/21/2019   CO2 29 05/21/2019   INR 1.3 (H) 05/21/2019   HGBA1C 8.0 (A) 02/06/2019    EKG: 06/16/19 ([facility]):  Sinus rhythm with premature atrial complexes Left bundle branch block (old)   CV: Echo 08/10/11 [facility]; during admission for right foot osteomyelitis, anemia): Summary: Left ventricular size systolic function is normal.  EF 55%.  The left atrium is moderately dilated.  There is moderate to severe mitral regurgitation.  There is mild tricuspid regurgitation.  No apparent valve vegetation. (Comparison 03/27/06: LVEF 45-55%, moderate hypokinesis of the mid septal wall)  According to Dr. Donivan Scull notes, "last stress test July 2008 showing mild ischemia in the mid anteroseptal, apical anterior and apical septal region. Ejection fraction 40%. This test was done post bypass. No cardiac catheterization was done in followup as he had no symptoms."   Past Medical History:  Diagnosis Date  . Anemia    hx of  . Arthritis   . Asthma    childhood  . Cataract   . CHF (congestive heart failure) ([place])   . Coronary artery disease   . DM (diabetes mellitus), type 2, uncontrolled, periph vascular complic (HCC)    Type  II  . Edema    feet/legs  . Hypertension   . Kidney stones   . Left leg DVT ([place])    2000s  . MDD (recurrent major depressive disorder) in remission ([place])   . Mitral regurgitation 08/10/2011  . Peripheral vascular disease (HCC)    Left toe amputations. Dr Lucky Cowboy  . Pulmonary embolism ([place]) 09/05/2005  . Pure hypercholesterolemia   . Shortness of breath dyspnea   . Syncope    resolved    Past Surgical History:  Procedure Laterality Date  . AMPUTATION TOE Left 03/17/2017    Procedure: FIRST RAY RESECTION LEFT FOOT;  Surgeon: Sharlotte Alamo, DPM;  Location: [facility];  Service: Podiatry;  Laterality: Left;  . CARDIAC CATHETERIZATION    . CATARACT EXTRACTION W/PHACO Left 06/03/2015   Procedure: CATARACT EXTRACTION PHACO AND INTRAOCULAR LENS PLACEMENT (IOC);  Surgeon: Eulogio Bear, MD;  Location: [facility];  Service: Ophthalmology;  Laterality: Left;  Lot# [id number] H [place]: 1.06 AP%: 5.5 CDE: 3.69  . CATARACT EXTRACTION W/PHACO Right 12/10/2017   Procedure: Aborted phacoemusification of the right eye.  No intraocular lens was implated.;  Surgeon: Eulogio Bear, MD;  Location: [facility] [place];  Service: Ophthalmology;  Laterality: Right;  Diabetic - insulin  . COLONOSCOPY    . CORONARY ARTERY BYPASS GRAFT  2008  . EYE SURGERY    . PARS PLANA VITRECTOMY Right 02/17/2015   Procedure: PARS PLANA VITRECTOMY WITH 25 GAUGE, endolaser, right eye;  Surgeon: Milus Height, MD;  Location: [facility];  Service: Ophthalmology;  Laterality: Right;  fluid pack lot # [id number] H endolaser: 200w, [id number]  . PERCUTANEOUS STENT INTERVENTION     right leg  . PERIPHERAL VASCULAR BALLOON ANGIOPLASTY Left 03/20/2017   Procedure: PERIPHERAL VASCULAR BALLOON ANGIOPLASTY;  Surgeon: Katha Cabal, MD;  Location: [place] [facility];  Service: Cardiovascular;  Laterality: Left;  . PERIPHERALLY INSERTED CENTRAL CATHETER INSERTION  2013  . TOE AMPUTATION     left 5th, right 4th and 5th    MEDICATIONS: No current facility-administered medications for this encounter.   Marland Kitchen aspirin EC 81 MG tablet  . carvedilol (COREG) 6.25 MG tablet  . clopidogrel (PLAVIX) 75 MG tablet  . ferrous gluconate (FERGON) 324 MG tablet  . furosemide (LASIX) 20 MG tablet  . losartan (COZAAR) 25 MG tablet  . NOVOLIN 70/30 (70-30) 100 UNIT/ML injection  . potassium chloride (K-DUR) 10 MEQ tablet  . rosuvastatin (CRESTOR) 20 MG tablet  . sertraline (ZOLOFT) 100 MG tablet  . triamcinolone cream (KENALOG) 0.1 %   . XARELTO 10 MG TABS tablet  . Na Sulfate-K Sulfate-Mg Sulf 17.5-3.13-1.6 GM/177ML SOLN  . TRUEPLUS INSULIN SYRINGE 31G X 5/16" 0.5 ML MISC    Myra Gianotti, PA-C Surgical Short Stay/Anesthesiology [facility] Phone [phone] [facility] Phone [phone] 06/17/2019 3:22 PM

## 2019-06-17 NOTE — Telephone Encounter (Signed)
The pt prescription was sent the day of the office visit as well as a copy of the instructions provided.  The pt tells me that he has a lot of paper and does not know where the instructions are and he states he did not pick up the prep as instructed.  The prep was resent and I offered to print the instructions for him to come pick up at the office today and he states he can not do that.  I offered to send him a link for my chart so that he can see the information via My Chart.  He did agree to have the link texted to him and he states he will sign up and get the instructions.  I resent the prescription.

## 2019-06-18 ENCOUNTER — Encounter (HOSPITAL_COMMUNITY)
Admission: RE | Disposition: A | Discharge status: Home / Self Care | Payer: Self-pay | Source: Home / Self Care | Attending: Gastroenterology

## 2019-06-18 ENCOUNTER — Encounter (HOSPITAL_COMMUNITY): Payer: Self-pay | Admitting: Gastroenterology

## 2019-06-18 ENCOUNTER — Ambulatory Visit (HOSPITAL_COMMUNITY): Payer: Medicare HMO | Admitting: Vascular Surgery

## 2019-06-18 ENCOUNTER — Other Ambulatory Visit: Payer: Self-pay | Admitting: Physician Assistant

## 2019-06-18 ENCOUNTER — Ambulatory Visit (HOSPITAL_COMMUNITY)
Admission: RE | Admit: 2019-06-18 | Discharge: 2019-06-18 | Disposition: A | Discharge status: Home / Self Care | Payer: Medicare HMO | Attending: Gastroenterology | Admitting: Gastroenterology

## 2019-06-18 DIAGNOSIS — K641 Second degree hemorrhoids: Secondary | ICD-10-CM | POA: Insufficient documentation

## 2019-06-18 DIAGNOSIS — E1136 Type 2 diabetes mellitus with diabetic cataract: Secondary | ICD-10-CM | POA: Insufficient documentation

## 2019-06-18 DIAGNOSIS — Z86010 Personal history of colonic polyps: Secondary | ICD-10-CM

## 2019-06-18 DIAGNOSIS — I110 Hypertensive heart disease with heart failure: Secondary | ICD-10-CM | POA: Diagnosis not present

## 2019-06-18 DIAGNOSIS — F329 Major depressive disorder, single episode, unspecified: Secondary | ICD-10-CM | POA: Diagnosis not present

## 2019-06-18 DIAGNOSIS — E7800 Pure hypercholesterolemia, unspecified: Secondary | ICD-10-CM | POA: Insufficient documentation

## 2019-06-18 DIAGNOSIS — Z86711 Personal history of pulmonary embolism: Secondary | ICD-10-CM | POA: Insufficient documentation

## 2019-06-18 DIAGNOSIS — E1151 Type 2 diabetes mellitus with diabetic peripheral angiopathy without gangrene: Secondary | ICD-10-CM | POA: Insufficient documentation

## 2019-06-18 DIAGNOSIS — Z79899 Other long term (current) drug therapy: Secondary | ICD-10-CM | POA: Diagnosis not present

## 2019-06-18 DIAGNOSIS — K259 Gastric ulcer, unspecified as acute or chronic, without hemorrhage or perforation: Secondary | ICD-10-CM | POA: Diagnosis not present

## 2019-06-18 DIAGNOSIS — Z951 Presence of aortocoronary bypass graft: Secondary | ICD-10-CM | POA: Insufficient documentation

## 2019-06-18 DIAGNOSIS — K2950 Unspecified chronic gastritis without bleeding: Secondary | ICD-10-CM | POA: Diagnosis not present

## 2019-06-18 DIAGNOSIS — D509 Iron deficiency anemia, unspecified: Secondary | ICD-10-CM | POA: Diagnosis not present

## 2019-06-18 DIAGNOSIS — K3189 Other diseases of stomach and duodenum: Secondary | ICD-10-CM | POA: Insufficient documentation

## 2019-06-18 DIAGNOSIS — I509 Heart failure, unspecified: Secondary | ICD-10-CM | POA: Diagnosis not present

## 2019-06-18 DIAGNOSIS — Z6838 Body mass index (BMI) 38.0-38.9, adult: Secondary | ICD-10-CM | POA: Insufficient documentation

## 2019-06-18 DIAGNOSIS — M1990 Unspecified osteoarthritis, unspecified site: Secondary | ICD-10-CM | POA: Diagnosis not present

## 2019-06-18 DIAGNOSIS — I2510 Atherosclerotic heart disease of native coronary artery without angina pectoris: Secondary | ICD-10-CM | POA: Diagnosis not present

## 2019-06-18 DIAGNOSIS — K635 Polyp of colon: Secondary | ICD-10-CM | POA: Diagnosis not present

## 2019-06-18 DIAGNOSIS — J45909 Unspecified asthma, uncomplicated: Secondary | ICD-10-CM | POA: Diagnosis not present

## 2019-06-18 DIAGNOSIS — K2970 Gastritis, unspecified, without bleeding: Secondary | ICD-10-CM | POA: Diagnosis not present

## 2019-06-18 DIAGNOSIS — Z888 Allergy status to other drugs, medicaments and biological substances status: Secondary | ICD-10-CM | POA: Insufficient documentation

## 2019-06-18 DIAGNOSIS — Z7902 Long term (current) use of antithrombotics/antiplatelets: Secondary | ICD-10-CM | POA: Diagnosis not present

## 2019-06-18 DIAGNOSIS — Z794 Long term (current) use of insulin: Secondary | ICD-10-CM | POA: Diagnosis not present

## 2019-06-18 DIAGNOSIS — Z86718 Personal history of other venous thrombosis and embolism: Secondary | ICD-10-CM | POA: Insufficient documentation

## 2019-06-18 DIAGNOSIS — Z7982 Long term (current) use of aspirin: Secondary | ICD-10-CM | POA: Diagnosis not present

## 2019-06-18 DIAGNOSIS — Z7901 Long term (current) use of anticoagulants: Secondary | ICD-10-CM | POA: Insufficient documentation

## 2019-06-18 DIAGNOSIS — D123 Benign neoplasm of transverse colon: Secondary | ICD-10-CM | POA: Diagnosis not present

## 2019-06-18 DIAGNOSIS — Z8719 Personal history of other diseases of the digestive system: Secondary | ICD-10-CM | POA: Diagnosis not present

## 2019-06-18 HISTORY — PX: ENDOSCOPIC MUCOSAL RESECTION: SHX6839

## 2019-06-18 HISTORY — PX: POLYPECTOMY: SHX5525

## 2019-06-18 HISTORY — PX: ESOPHAGOGASTRODUODENOSCOPY (EGD) WITH PROPOFOL: SHX5813

## 2019-06-18 HISTORY — PX: COLONOSCOPY WITH PROPOFOL: SHX5780

## 2019-06-18 HISTORY — PX: BIOPSY: SHX5522

## 2019-06-18 HISTORY — DX: Personal history of colon polyps, unspecified: Z86.0100

## 2019-06-18 HISTORY — PX: HEMOSTASIS CLIP PLACEMENT: SHX6857

## 2019-06-18 HISTORY — DX: Presence of spectacles and contact lenses: Z97.3

## 2019-06-18 HISTORY — DX: Personal history of colonic polyps: Z86.010

## 2019-06-18 HISTORY — PX: SUBMUCOSAL LIFTING INJECTION: SHX6855

## 2019-06-18 LAB — GLUCOSE, CAPILLARY: Glucose-Capillary: 128 mg/dL — ABNORMAL HIGH (ref 70–99)

## 2019-06-18 SURGERY — COLONOSCOPY WITH PROPOFOL
Anesthesia: Monitor Anesthesia Care

## 2019-06-18 MED ORDER — PROPOFOL 500 MG/50ML IV EMUL
INTRAVENOUS | Status: DC | PRN
  Administered 2019-06-18: 100 ug/kg/min via INTRAVENOUS

## 2019-06-18 MED ORDER — LIDOCAINE 2% (20 MG/ML) 5 ML SYRINGE
INTRAMUSCULAR | Status: DC | PRN
  Administered 2019-06-18: 40 mg via INTRAVENOUS

## 2019-06-18 MED ORDER — OMEPRAZOLE 40 MG PO CPDR: 40.0000 mg | DELAYED_RELEASE_CAPSULE | Freq: Every day | ORAL | 4 refills | Status: DC

## 2019-06-18 MED ORDER — PHENYLEPHRINE 40 MCG/ML (10ML) SYRINGE FOR IV PUSH (FOR BLOOD PRESSURE SUPPORT)
PREFILLED_SYRINGE | INTRAVENOUS | Status: DC | PRN
  Administered 2019-06-18 (×2): 80 ug via INTRAVENOUS

## 2019-06-18 MED ORDER — LACTATED RINGERS IV SOLN
INTRAVENOUS | Status: AC | PRN
  Administered 2019-06-18: 1000 mL via INTRAVENOUS

## 2019-06-18 MED ORDER — SODIUM CHLORIDE 0.9 % IV SOLN: INTRAVENOUS | Status: DC

## 2019-06-18 SURGICAL SUPPLY — 25 items

## 2019-06-18 NOTE — Op Note (Signed)
[facility] Patient Name: Benjamin Davidson Procedure Date : 06/18/2019 MRN: [id number] Attending MD: Justice Britain , MD Date of Birth: [birth year] CSN: [id number] Age: 69 Admit Type: Outpatient Procedure:                Colonoscopy Indications:              Excision of colonic polyp Providers:                Justice Britain, MD, Carlyn Reichert, RN, Elspeth Cho Tech., Technician, Carver Fila Referring MD:             Venia Carbon Medicines:                Monitored Anesthesia Care Complications:            No immediate complications. Estimated Blood Loss:     Estimated blood loss was minimal. Procedure:                Pre-Anesthesia Assessment:                           - Prior to the procedure, a History and Physical                            was performed, and patient medications and                            allergies were reviewed. The patient's tolerance of                            previous anesthesia was also reviewed. The risks                            and benefits of the procedure and the sedation                            options and risks were discussed with the patient.                            All questions were answered, and informed consent                            was obtained. Prior Anticoagulants: The patient                            last took Plavix (clopidogrel) 5 days and Xarelto                            (rivaroxaban) 2 days prior to the procedure and has                            taken no previous anticoagulant or antiplatelet  agents except for aspirin. ASA Grade Assessment: II                            - A patient with mild systemic disease. After                            reviewing the risks and benefits, the patient was                            deemed in satisfactory condition to undergo the                            procedure.                           After  obtaining informed consent, the colonoscope                            was passed under direct vision. Throughout the                            procedure, the patient's blood pressure, pulse, and                            oxygen saturations were monitored continuously. The                            CF-HQ190L ([id number] Olympus colonoscope was                            introduced through the anus and advanced to the 4                            cm into the ileum. The colonoscopy was performed                            without difficulty. This was aided in the sense                            that we used a double abdominal binder for the                            patient and used water infusion and slow movements                            with some abdominal pressure being administered as                            well. The patient tolerated the procedure. The                            quality of the bowel preparation was good. The  terminal ileum, ileocecal valve, appendiceal                            orifice, and rectum were photographed. Scope In: 9:23:18 AM Scope Out: 10:03:13 AM Scope Withdrawal Time: 0 hours 32 minutes 49 seconds  Total Procedure Duration: 0 hours 39 minutes 55 seconds  Findings:      The digital rectal exam findings include hemorrhoids. Pertinent       negatives include no palpable rectal lesions.      The terminal ileum and ileocecal valve appeared normal.      A 28 mm polyp was found in the hepatic flexure. The polyp was sessile.       Preparations were made for mucosal resection. Orise gel was injected to       raise the lesion. Snare mucosal resection was performed. Resection and       retrieval were complete. STSC for tissue destruction of the margin using       snare tip was successful. To prevent bleeding after mucosal resection,       six hemostatic clips were successfully placed (MR conditional). There       was no  bleeding during, or at the end, of the procedure.      A 12 mm polyp was found in the transverse colon. The polyp was sessile.       The polyp was removed with a cold snare. Resection and retrieval were       complete.      Normal mucosa was found in the entire colon.      Non-bleeding non-thrombosed internal hemorrhoids were found during       retroflexion, during perianal exam and during digital exam. The       hemorrhoids were Grade II (internal hemorrhoids that prolapse but reduce       spontaneously). Impression:               - Hemorrhoids found on digital rectal exam.                           - The examined portion of the ileum was normal.                           - One 28 mm polyp at the hepatic flexure, removed                            with mucosal resection. Resected and retrieved.                            Treated with STSC to margin. Clips (MR conditional)                            were placed.                           - One 12 mm polyp in the transverse colon, removed                            with a cold snare. Resected and retrieved.                           -  Normal mucosa in the entire examined colon.                           - Non-bleeding non-thrombosed internal hemorrhoids. Recommendation:           - The patient will be observed post-procedure,                            until all discharge criteria are met.                           - Discharge patient to home.                           - Patient has a contact number available for                            emergencies. The signs and symptoms of potential                            delayed complications were discussed with the                            patient. Return to normal activities tomorrow.                            Written discharge instructions were provided to the                            patient.                           - High fiber diet.                           - Use FiberCon 1 tablet  PO daily.                           - OK to restart Aspirin tomorrow. Xarelto restart                            on 5/21 PM. Plavix restart on 5/22 PM. Hopefully by                            doing this we will decrease risk of post-procedural                            bleeding.                           - Otherwise continue present medications.                           - Await pathology results.                           -  Repeat colonoscopy in 6/12 months for                            surveillance based on pathology results and how the                            polyp resection looks of the EMR. If complete                            without margin in cautery then 1 year otherwise in                            63-month.                           - The findings and recommendations were discussed                            with the patient.                           - The findings and recommendations were discussed                            with the patient's family. Procedure Code(s):        --- Professional ---                           [id number] Colonoscopy, flexible; with endoscopic                            mucosal resection                           45385, 545 Colonoscopy, flexible; with removal of                            tumor(s), polyp(s), or other lesion(s) by snare                            technique Diagnosis Code(s):        --- Professional ---                           K64.1, Second degree hemorrhoids                           K63.5, Polyp of colon CPT copyright 2019 American Medical Association. All rights reserved. The codes documented in this report are preliminary and upon coder review may  be revised to meet current compliance requirements. GJustice Britain MD 06/18/2019 10:33:05 AM Number of Addenda: 0

## 2019-06-18 NOTE — Transfer of Care (Signed)
Immediate Anesthesia Transfer of Care Note  Patient: Benjamin Davidson  Procedure(s) Performed: COLONOSCOPY WITH PROPOFOL (N/A ) ENDOSCOPIC MUCOSAL RESECTION (N/A ) ESOPHAGOGASTRODUODENOSCOPY (EGD) WITH PROPOFOL (N/A ) BIOPSY SUBMUCOSAL LIFTING INJECTION HEMOSTASIS CLIP PLACEMENT FOREIGN BODY REMOVAL POLYPECTOMY  Patient Location: Endoscopy Unit  Anesthesia Type:MAC  Level of Consciousness: awake, alert  and oriented  Airway & Oxygen Therapy: Patient Spontanous Breathing and Patient connected to nasal cannula oxygen  Post-op Assessment: Report given to RN and Post -op Vital signs reviewed and stable  Post vital signs: Reviewed and stable  Last Vitals:  Vitals Value Taken Time  BP  06/18/19 1013  Temp    Pulse 63 06/18/19 1014  Resp 12 06/18/19 1014  SpO2 99 % 06/18/19 1014  Vitals shown include unvalidated device data.  Last Pain:  Vitals:   06/18/19 1012  TempSrc:   PainSc: 0-No pain         Complications: No apparent anesthesia complications

## 2019-06-18 NOTE — H&P (Addendum)
GASTROENTEROLOGY PROCEDURE H&P NOTE   Primary Care Physician: Venia Carbon, MD  HPI: Benjamin Davidson is a 69 y.o. male who presents for EGD for IDA evaluation and Colonoscopy for EMR attempt of HF polyp.  Past Medical History:  Diagnosis Date  . Anemia    hx of  . Arthritis   . Asthma    childhood  . Cataract   . CHF (congestive heart failure) ([place])   . Coronary artery disease   . DM (diabetes mellitus), type 2, uncontrolled, periph vascular complic ([place])    Type II  . Edema    feet/legs  . Hx of colonic polyps   . Hypertension   . Kidney stones   . Left leg DVT ([place])    2000s  . MDD (recurrent major depressive disorder) in remission ([place])   . Mitral regurgitation 08/10/2011  . Peripheral vascular disease ([facility])    Left toe amputations. Dr Lucky Cowboy  . Pulmonary embolism ([place]) 09/05/2005  . Pure hypercholesterolemia   . Shortness of breath dyspnea   . Syncope    resolved  . Wears glasses    Past Surgical History:  Procedure Laterality Date  . AMPUTATION TOE Left 03/17/2017   Procedure: FIRST RAY RESECTION LEFT FOOT;  Surgeon: Sharlotte Alamo, DPM;  Location: [facility];  Service: Podiatry;  Laterality: Left;  . CARDIAC CATHETERIZATION    . CATARACT EXTRACTION W/PHACO Left 06/03/2015   Procedure: CATARACT EXTRACTION PHACO AND INTRAOCULAR LENS PLACEMENT (IOC);  Surgeon: Eulogio Bear, MD;  Location: [facility];  Service: Ophthalmology;  Laterality: Left;  Lot# [id number] H [place]: 1.06 AP%: 5.5 CDE: 3.69  . CATARACT EXTRACTION W/PHACO Right 12/10/2017   Procedure: Aborted phacoemusification of the right eye.  No intraocular lens was implated.;  Surgeon: Eulogio Bear, MD;  Location: [facility];  Service: Ophthalmology;  Laterality: Right;  Diabetic - insulin  . COLONOSCOPY    . CORONARY ARTERY BYPASS GRAFT  2008  . EYE SURGERY    . PARS PLANA VITRECTOMY Right 02/17/2015   Procedure: PARS PLANA VITRECTOMY WITH 25 GAUGE, endolaser, right eye;  Surgeon: Milus Height, MD;  Location: [facility];  Service: Ophthalmology;  Laterality: Right;  fluid pack lot # [id number] H endolaser: 200w, [id number]  . PERCUTANEOUS STENT INTERVENTION     right leg  . PERIPHERAL VASCULAR BALLOON ANGIOPLASTY Left 03/20/2017   Procedure: PERIPHERAL VASCULAR BALLOON ANGIOPLASTY;  Surgeon: Katha Cabal, MD;  Location: [facility];  Service: Cardiovascular;  Laterality: Left;  . PERIPHERALLY INSERTED CENTRAL CATHETER INSERTION  2013  . TOE AMPUTATION     left 5th, right 4th and 5th   Current Facility-Administered Medications  Medication Dose Route Frequency Provider Last Rate Last Admin  . 0.9 %  sodium chloride infusion   Intravenous Continuous Mansouraty, Telford Nab., MD      . lactated ringers infusion    Continuous PRN Mansouraty, Telford Nab., MD   1,000 mL at 06/18/19 0810   Allergies  Allergen Reactions  . Losartan Other (See Comments)    Not an allergy -  Not an allergy -    . Lisinopril     Cough Flu like symptoms    Family History  Problem Relation Age of Onset  . Heart disease Sister   . Hyperlipidemia Brother   . Colon cancer Neg Hx   . Esophageal cancer Neg Hx   . Inflammatory bowel disease Neg Hx   . Liver disease Neg Hx   . Pancreatic  cancer Neg Hx   . Rectal cancer Neg Hx   . Stomach cancer Neg Hx    Social History   Socioeconomic History  . Marital status: Divorced    Spouse name: Not on file  . Number of children: 0  . Years of education: Not on file  . Highest education level: Not on file  Occupational History  . Occupation: [name]    Comment: Disabled since 2000's  Tobacco Use  . Smoking status: Never Smoker  . Smokeless tobacco: Never Used  Substance and Sexual Activity  . Alcohol use: No    Alcohol/week: 0.0 standard drinks  . Drug use: No  . Sexual activity: Not on file  Other Topics Concern  . Not on file  Social History Narrative   Has living will   Benjamin Davidson, sister, as health care POA.   Would accept  resuscitation attempts   Not sure about tube feeds   Social Determinants of Health   Financial Resource Strain:   . Difficulty of Paying Living Expenses:   Food Insecurity:   . Worried About Charity fundraiser in the Last Year:   . Arboriculturist in the Last Year:   Transportation Needs:   . Film/video editor (Medical):   Marland Kitchen Lack of Transportation (Non-Medical):   Physical Activity:   . Days of Exercise per Week:   . Minutes of Exercise per Session:   Stress:   . Feeling of Stress :   Social Connections:   . Frequency of Communication with Friends and Family:   . Frequency of Social Gatherings with Friends and Family:   . Attends Religious Services:   . Active Member of Clubs or Organizations:   . Attends Archivist Meetings:   Marland Kitchen Marital Status:   Intimate Partner Violence:   . Fear of Current or Ex-Partner:   . Emotionally Abused:   Marland Kitchen Physically Abused:   . Sexually Abused:     Physical Exam: Vital signs in last 24 hours: Temp:  [99 F (37.2 C)] 99 F (37.2 C) (05/19 0739) Resp:  [20] 20 (05/19 0739) BP: (190)/(75) 190/75 (05/19 0739) SpO2:  [97 %] 97 % (05/19 0739) Weight:  [119.5 kg] 119.5 kg (05/19 0739)   GEN: NAD EYE: Sclerae anicteric ENT: MMM CV: Non-tachycardic GI: Soft, NT/ND NEURO:  Alert & Oriented x 3  Lab Results: No results for input(s): WBC, HGB, HCT, PLT in the last 72 hours. BMET No results for input(s): NA, K, CL, CO2, GLUCOSE, BUN, CREATININE, CALCIUM in the last 72 hours. LFT No results for input(s): PROT, ALBUMIN, AST, ALT, ALKPHOS, BILITOT, BILIDIR, IBILI in the last 72 hours. PT/INR No results for input(s): LABPROT, INR in the last 72 hours.   Impression / Plan: This is a 69 y.o.male who presents for EGD for IDA evaluation and Colonoscopy for EMR attempt of HF polyp.  The risks and benefits of endoscopic evaluation were discussed with the patient; these include but are not limited to the risk of perforation,  infection, bleeding, missed lesions, lack of diagnosis, severe illness requiring hospitalization, as well as anesthesia and sedation related illnesses.  The patient is agreeable to proceed.    Justice Britain, MD [facility] Office # [phone]

## 2019-06-18 NOTE — Op Note (Signed)
[facility] Patient Name: Benjamin Davidson Procedure Date : 06/18/2019 MRN: [id number] Attending MD: Justice Britain , MD Date of Birth: 1950/06/30 CSN: [id number] Age: 69 Admit Type: Outpatient Procedure:                Upper GI endoscopy Indications:              Iron deficiency anemia Providers:                Justice Britain, MD, Carlyn Reichert, RN, Elspeth Cho Tech., Technician, Carver Fila Referring MD:             Venia Carbon Medicines:                Monitored Anesthesia Care Complications:            No immediate complications. Estimated Blood Loss:     Estimated blood loss was minimal. Procedure:                Pre-Anesthesia Assessment:                           - Prior to the procedure, a History and Physical                            was performed, and patient medications and                            allergies were reviewed. The patient's tolerance of                            previous anesthesia was also reviewed. The risks                            and benefits of the procedure and the sedation                            options and risks were discussed with the patient.                            All questions were answered, and informed consent                            was obtained. Prior Anticoagulants: The patient                            last took Plavix (clopidogrel) 5 days prior to the                            procedure and has taken no other previous                            anticoagulant or antiplatelet agents except for  aspirin. ASA Grade Assessment: II - A patient with                            mild systemic disease. After reviewing the risks                            and benefits, the patient was deemed in                            satisfactory condition to undergo the procedure.                           After obtaining informed consent, the endoscope was                       passed under direct vision. Throughout the                            procedure, the patient's blood pressure, pulse, and                            oxygen saturations were monitored continuously. The                            [id number] Olympus gastroscope was                            introduced through the mouth, and advanced to the                            second part of duodenum. The upper GI endoscopy was                            accomplished without difficulty. The patient                            tolerated the procedure. Scope In: Scope Out: Findings:      No gross lesions were noted in the entire esophagus.      The Z-line was regular and was found 40 cm from the incisors.      Multiple dispersed small erosions with no bleeding and no stigmata of       recent bleeding were found in the gastric body, at the incisura and in       the gastric antrum.      A J-shaped deformity was found of the entire examined stomach.      No other gross lesions were noted in the entire examined stomach.       Biopsies were taken with a cold forceps for histology and Helicobacter       pylori testing.      No gross lesions were noted in the duodenal bulb, in the first portion       of the duodenum and in the second portion of the duodenum. Biopsies for       histology were taken with a cold forceps for evaluation of celiac       disease.  Impression:               - No gross lesions in esophagus.                           - Z-line regular, 40 cm from the incisors.                           - Erosive gastropathy with no bleeding and no                            stigmata of recent bleeding in the body and antrum.                            J-shaped deformity of the entire stomach. No other                            gross lesions in the stomach. Biopsied.                           - No gross lesions in the duodenal bulb, in the                            first  portion of the duodenum and in the second                            portion of the duodenum. Biopsied. Recommendation:           - Proceed to scheduled colonoscopy.                           - Await pathology results.                           - Observe patient's clinical course.                           - Start Omeprazole 40 mg daily.                           - Will plan to recheck Iron studies in 6-weeks. If                            patient still has evidence of IDA, then will need                            to proceed with IV Iron infusions and consideration                            of VCE/Enteroscopy.                           - The findings and recommendations were discussed  with the patient.                           - The findings and recommendations were discussed                            with the patient's family. Procedure Code(s):        --- Professional ---                           [phone], Esophagogastroduodenoscopy, flexible,                            transoral; with biopsy, single or multiple Diagnosis Code(s):        --- Professional ---                           K31.89, Other diseases of stomach and duodenum                           D50.9, Iron deficiency anemia, unspecified CPT copyright 2019 American Medical Association. All rights reserved. The codes documented in this report are preliminary and upon coder review may  be revised to meet current compliance requirements. Justice Britain, MD 06/18/2019 10:22:09 AM Number of Addenda: 0

## 2019-06-18 NOTE — Anesthesia Postprocedure Evaluation (Signed)
Anesthesia Post Note  Patient: Benjamin Davidson  Procedure(s) Performed: COLONOSCOPY WITH PROPOFOL (N/A ) ENDOSCOPIC MUCOSAL RESECTION (N/A ) ESOPHAGOGASTRODUODENOSCOPY (EGD) WITH PROPOFOL (N/A ) BIOPSY SUBMUCOSAL LIFTING INJECTION HEMOSTASIS CLIP PLACEMENT FOREIGN BODY REMOVAL POLYPECTOMY     Patient location during evaluation: Endoscopy Anesthesia Type: MAC Level of consciousness: awake and alert Pain management: pain level controlled Vital Signs Assessment: post-procedure vital signs reviewed and stable Respiratory status: spontaneous breathing, nonlabored ventilation and respiratory function stable Cardiovascular status: stable and blood pressure returned to baseline Postop Assessment: no apparent nausea or vomiting Anesthetic complications: no    Last Vitals:  Vitals:   06/18/19 1040 06/18/19 1050  BP: (!) 135/34 (!) 157/46  Pulse: 66 66  Resp: 13 14  Temp:    SpO2: 99% 100%    Last Pain:  Vitals:   06/18/19 1050  TempSrc:   PainSc: 0-No pain                 Panagiota Perfetti,W. EDMOND

## 2019-06-18 NOTE — Discharge Instructions (Signed)
YOU HAD AN ENDOSCOPIC PROCEDURE TODAY: Refer to the procedure report and other information in the discharge instructions given to you for any specific questions about what was found during the examination. If this information does not answer your questions, please call [place] office at [phone] to clarify.   YOU SHOULD EXPECT: Some feelings of bloating in the abdomen. Passage of more gas than usual. Walking can help get rid of the air that was put into your GI tract during the procedure and reduce the bloating. If you had a lower endoscopy (such as a colonoscopy or flexible sigmoidoscopy) you may notice spotting of blood in your stool or on the toilet paper. Some abdominal soreness may be present for a day or two, also.  DIET: Your first meal following the procedure should be a light meal and then it is ok to progress to your normal diet. A half-sandwich or bowl of soup is an example of a good first meal. Heavy or fried foods are harder to digest and may make you feel nauseous or bloated. Drink plenty of fluids but you should avoid alcoholic beverages for 24 hours. If you had a esophageal dilation, please see attached instructions for diet.    ACTIVITY: Your care partner should take you home directly after the procedure. You should plan to take it easy, moving slowly for the rest of the day. You can resume normal activity the day after the procedure however YOU SHOULD NOT DRIVE, use power tools, machinery or perform tasks that involve climbing or major physical exertion for 24 hours (because of the sedation medicines used during the test).   SYMPTOMS TO REPORT IMMEDIATELY: A gastroenterologist can be reached at any hour. Please call [phone]  for any of the following symptoms:  Following lower endoscopy (colonoscopy, flexible sigmoidoscopy) Excessive amounts of blood in the stool  Significant tenderness, worsening of abdominal pains  Swelling of the abdomen that is new, acute  Fever of 100 or  higher  Following upper endoscopy (EGD, EUS, ERCP, esophageal dilation) Vomiting of blood or coffee ground material  New, significant abdominal pain  New, significant chest pain or pain under the shoulder blades  Painful or persistently difficult swallowing  New shortness of breath  Black, tarry-looking or red, bloody stools  FOLLOW UP:  If any biopsies were taken you will be contacted by phone or by letter within the next 1-3 weeks. Call [phone]  if you have not heard about the biopsies in 3 weeks.  Please also call with any specific questions about appointments or follow up tests.  OK to restart Aspirin tomorrow. Restart Xarelto in 48 hours (no sooner than 5/21 PM).   Would not restart Plavix for 72 hours (no sooner than 5/22 in the PM).   Hopefully these interventions will help decrease risk of post-procedural bleeding. No NSAIDs otherwise for 2-weeks.

## 2019-06-19 ENCOUNTER — Other Ambulatory Visit: Payer: Self-pay

## 2019-06-19 DIAGNOSIS — D509 Iron deficiency anemia, unspecified: Secondary | ICD-10-CM

## 2019-06-19 LAB — SURGICAL PATHOLOGY

## 2019-06-21 ENCOUNTER — Other Ambulatory Visit: Payer: Self-pay | Admitting: Cardiovascular Disease

## 2019-06-21 ENCOUNTER — Other Ambulatory Visit (INDEPENDENT_AMBULATORY_CARE_PROVIDER_SITE_OTHER): Payer: Self-pay | Admitting: Nurse Practitioner

## 2019-06-22 ENCOUNTER — Encounter: Payer: Self-pay | Admitting: Gastroenterology

## 2019-06-23 NOTE — Telephone Encounter (Signed)
Patient on xarelto, but will need visit for further refills

## 2019-06-23 NOTE — Telephone Encounter (Signed)
Patient no show last appointment 01/02/2019 and no upcoming appointment do we move further with refills

## 2019-06-26 ENCOUNTER — Other Ambulatory Visit (INDEPENDENT_AMBULATORY_CARE_PROVIDER_SITE_OTHER): Payer: Self-pay | Admitting: Nurse Practitioner

## 2019-06-26 DIAGNOSIS — H35371 Puckering of macula, right eye: Secondary | ICD-10-CM | POA: Diagnosis not present

## 2019-06-26 DIAGNOSIS — E113511 Type 2 diabetes mellitus with proliferative diabetic retinopathy with macular edema, right eye: Secondary | ICD-10-CM | POA: Diagnosis not present

## 2019-06-26 DIAGNOSIS — E113592 Type 2 diabetes mellitus with proliferative diabetic retinopathy without macular edema, left eye: Secondary | ICD-10-CM | POA: Diagnosis not present

## 2019-06-26 NOTE — Telephone Encounter (Signed)
No he needs an appointment

## 2019-06-26 NOTE — Telephone Encounter (Signed)
Can you please call this pt and schedule an appt  per the NP

## 2019-06-26 NOTE — Telephone Encounter (Signed)
Pt hasn't been seen since 09/2017 is this ok to refill?

## 2019-07-01 ENCOUNTER — Other Ambulatory Visit: Payer: Self-pay | Admitting: Internal Medicine

## 2019-07-02 ENCOUNTER — Other Ambulatory Visit: Payer: Self-pay | Admitting: Cardiovascular Disease

## 2019-07-08 DIAGNOSIS — E1142 Type 2 diabetes mellitus with diabetic polyneuropathy: Secondary | ICD-10-CM | POA: Diagnosis not present

## 2019-07-14 ENCOUNTER — Other Ambulatory Visit: Payer: Self-pay | Admitting: Gastroenterology

## 2019-07-18 ENCOUNTER — Other Ambulatory Visit: Payer: Self-pay | Admitting: Internal Medicine

## 2019-07-25 ENCOUNTER — Other Ambulatory Visit: Payer: Self-pay | Admitting: Internal Medicine

## 2019-07-25 NOTE — Telephone Encounter (Signed)
Last OV 02/06/19 Next apt 08/07/19 Last fill 07/18/2018 Sent in script

## 2019-07-29 ENCOUNTER — Ambulatory Visit: Payer: Medicare HMO | Admitting: Gastroenterology

## 2019-07-30 DIAGNOSIS — Z89422 Acquired absence of other left toe(s): Secondary | ICD-10-CM | POA: Diagnosis not present

## 2019-07-30 DIAGNOSIS — B351 Tinea unguium: Secondary | ICD-10-CM | POA: Diagnosis not present

## 2019-07-30 DIAGNOSIS — E1140 Type 2 diabetes mellitus with diabetic neuropathy, unspecified: Secondary | ICD-10-CM | POA: Diagnosis not present

## 2019-07-30 DIAGNOSIS — I739 Peripheral vascular disease, unspecified: Secondary | ICD-10-CM | POA: Diagnosis not present

## 2019-07-30 DIAGNOSIS — L851 Acquired keratosis [keratoderma] palmaris et plantaris: Secondary | ICD-10-CM | POA: Diagnosis not present

## 2019-07-30 DIAGNOSIS — Z794 Long term (current) use of insulin: Secondary | ICD-10-CM | POA: Diagnosis not present

## 2019-08-07 ENCOUNTER — Encounter: Payer: Self-pay | Admitting: Internal Medicine

## 2019-08-07 ENCOUNTER — Other Ambulatory Visit: Payer: Self-pay

## 2019-08-07 ENCOUNTER — Ambulatory Visit (INDEPENDENT_AMBULATORY_CARE_PROVIDER_SITE_OTHER): Payer: Medicare HMO | Admitting: Internal Medicine

## 2019-08-07 VITALS — BP 112/80 | HR 86 | Temp 97.4°F | Ht 68.0 in | Wt 255.0 lb

## 2019-08-07 DIAGNOSIS — Z0000 Encounter for general adult medical examination without abnormal findings: Secondary | ICD-10-CM | POA: Diagnosis not present

## 2019-08-07 DIAGNOSIS — D509 Iron deficiency anemia, unspecified: Secondary | ICD-10-CM | POA: Insufficient documentation

## 2019-08-07 DIAGNOSIS — F3340 Major depressive disorder, recurrent, in remission, unspecified: Secondary | ICD-10-CM

## 2019-08-07 DIAGNOSIS — Z7189 Other specified counseling: Secondary | ICD-10-CM | POA: Diagnosis not present

## 2019-08-07 DIAGNOSIS — E083513 Diabetes mellitus due to underlying condition with proliferative diabetic retinopathy with macular edema, bilateral: Secondary | ICD-10-CM

## 2019-08-07 DIAGNOSIS — I10 Essential (primary) hypertension: Secondary | ICD-10-CM

## 2019-08-07 DIAGNOSIS — R69 Illness, unspecified: Secondary | ICD-10-CM | POA: Diagnosis not present

## 2019-08-07 DIAGNOSIS — Z125 Encounter for screening for malignant neoplasm of prostate: Secondary | ICD-10-CM | POA: Diagnosis not present

## 2019-08-07 DIAGNOSIS — D500 Iron deficiency anemia secondary to blood loss (chronic): Secondary | ICD-10-CM

## 2019-08-07 DIAGNOSIS — E1142 Type 2 diabetes mellitus with diabetic polyneuropathy: Secondary | ICD-10-CM | POA: Diagnosis not present

## 2019-08-07 NOTE — Assessment & Plan Note (Signed)
Past injection therapy---needs follow up

## 2019-08-07 NOTE — Assessment & Plan Note (Signed)
From polyps

## 2019-08-07 NOTE — Assessment & Plan Note (Signed)
Didn't tolerate metformin Will consider increasing insulin or add medication if worse (probably glipizide or farxiga if he can afford)

## 2019-08-07 NOTE — Assessment & Plan Note (Signed)
Still controlled with the sertraline

## 2019-08-07 NOTE — Assessment & Plan Note (Signed)
BP Readings from Last 3 Encounters:  08/07/19 112/80  06/18/19 (!) 157/46  06/16/19 (!) 150/70   Good control on losartan and carvedilol

## 2019-08-07 NOTE — Addendum Note (Signed)
Addended by: Tammi Sou on: 08/07/2019 03:25 PM   Modules accepted: Orders

## 2019-08-07 NOTE — Assessment & Plan Note (Signed)
See social history 

## 2019-08-07 NOTE — Progress Notes (Signed)
Subjective:    Patient ID: Benjamin Davidson, male    DOB: 03-27-1950, 69 y.o.   MRN: [id number]  HPI Here for Medicare wellness visit and follow up of chronic health conditions This visit occurred during the SARS-CoV-2 public health emergency.  Safety protocols were in place, including screening questions prior to the visit, additional usage of staff PPE, and extensive cleaning of exam room while observing appropriate contact time as indicated for disinfecting solutions.   Reviewed advanced directives Reviewed other doctors----Dr Gollan--cardiology, Dr Darcus Pester, Dr Mansouraty--GI No hospitalizations or surgery this year No tobacco No alcohol Vision is okay---due to go back Hearing is okay No falls Has started walking--but has slacked off Independent with instrumental ADLs--- "but I am lazy" No sig memory issues  Did have the colonoscopy  2 large polyps --including a 21mm polyp. Negative for dysplasia Due to go back in a year Taking OTC iron  Thinks his diabetes has been "pretty good" Checks twice a day--doesn't adjust insulin Known CKD 3---but last test better PAD---keeps up with cardiology. No regular foot pain now  No chest pain No SOB No dizziness or syncope No sig edema No calf swelling or tenderness  Chronic depression Has been okay on the sertraline No current problems  Current Outpatient Medications on File Prior to Visit  Medication Sig Dispense Refill  . aspirin EC 81 MG tablet Take 1 tablet (81 mg total) by mouth daily. 150 tablet 2  . carvedilol (COREG) 6.25 MG tablet Take 1 tablet (6.25 mg total) by mouth 2 (two) times daily with a meal. 180 tablet 3  . ferrous gluconate (FERGON) 324 MG tablet TAKE 1 TABLET BY MOUTH ONCE A DAY 30 tablet 0  . furosemide (LASIX) 20 MG tablet TAKE 1 TABLET BY MOUTH ONCE A DAY. MAY TAKE ONE ADDITIONAL TABLET IF INCREASED SWELLING. 180 tablet 3  . losartan (COZAAR) 25 MG tablet TAKE 1 TABLET BY MOUTH ONCE A DAY 90 tablet  3  . NOVOLIN 70/30 (70-30) 100 UNIT/ML injection INJECT 35 UNITS SUBCUTANEOUSLY TWICE A DAY AS DIRECTED 20 mL 11  . omeprazole (PRILOSEC) 40 MG capsule Take 1 capsule (40 mg total) by mouth daily before breakfast. 30 capsule 4  . potassium chloride (KLOR-CON) 10 MEQ tablet TAKE 1 TABLET BY MOUTH ONCE A DAY 90 tablet 3  . rosuvastatin (CRESTOR) 20 MG tablet Take 1 tablet (20 mg total) by mouth daily. 90 tablet 3  . sertraline (ZOLOFT) 100 MG tablet TAKE 1 TABLET BY MOUTH ONCE A DAY 90 tablet 3  . triamcinolone cream (KENALOG) 0.1 % Apply 1 application topically 2 (two) times daily as needed. (Patient taking differently: Apply 1 application topically 2 (two) times daily as needed (irritation). ) 45 g 1  . TRUEPLUS INSULIN SYRINGE 31G X 5/16" 0.5 ML MISC USE AS DIRECTED TWICE DAILY 100 each 7  . XARELTO 10 MG TABS tablet TAKE 1 TABLET BY MOUTH ONCE A DAY 30 tablet 11   No current facility-administered medications on file prior to visit.    Allergies  Allergen Reactions  . Losartan Other (See Comments)    Not an allergy -  Not an allergy -    . Lisinopril     Cough Flu like symptoms     Past Medical History:  Diagnosis Date  . Anemia    hx of  . Arthritis   . Asthma    childhood  . Cataract   . CHF (congestive heart failure) ([place])   . Coronary  artery disease   . DM (diabetes mellitus), type 2, uncontrolled, periph vascular complic ([place])    Type II  . Edema    feet/legs  . Hx of colonic polyps   . Hypertension   . Kidney stones   . Left leg DVT ([place])    2000s  . MDD (recurrent major depressive disorder) in remission ([place])   . Mitral regurgitation 08/10/2011  . Peripheral vascular disease ([facility])    Left toe amputations. Dr Lucky Cowboy  . Pulmonary embolism ([place]) 09/05/2005  . Pure hypercholesterolemia   . Shortness of breath dyspnea   . Syncope    resolved  . Wears glasses     Past Surgical History:  Procedure Laterality Date  . AMPUTATION TOE Left 03/17/2017   Procedure:  FIRST RAY RESECTION LEFT FOOT;  Surgeon: Sharlotte Alamo, DPM;  Location: [facility];  Service: Podiatry;  Laterality: Left;  . BIOPSY  06/18/2019   Procedure: BIOPSY;  Surgeon: Rush Landmark Telford Nab., MD;  Location: [place];  Service: Gastroenterology;;  . CARDIAC CATHETERIZATION    . CATARACT EXTRACTION W/PHACO Left 06/03/2015   Procedure: CATARACT EXTRACTION PHACO AND INTRAOCULAR LENS PLACEMENT (IOC);  Surgeon: Eulogio Bear, MD;  Location: [facility];  Service: Ophthalmology;  Laterality: Left;  Lot# [id number] H [place]: 1.06 AP%: 5.5 CDE: 3.69  . CATARACT EXTRACTION W/PHACO Right 12/10/2017   Procedure: Aborted phacoemusification of the right eye.  No intraocular lens was implated.;  Surgeon: Eulogio Bear, MD;  Location: [place];  Service: Ophthalmology;  Laterality: Right;  Diabetic - insulin  . COLONOSCOPY    . COLONOSCOPY WITH PROPOFOL N/A 06/18/2019   Procedure: COLONOSCOPY WITH PROPOFOL;  Surgeon: Rush Landmark Telford Nab., MD;  Location: [place];  Service: Gastroenterology;  Laterality: N/A;  . CORONARY ARTERY BYPASS GRAFT  2008  . ENDOSCOPIC MUCOSAL RESECTION N/A 06/18/2019   Procedure: ENDOSCOPIC MUCOSAL RESECTION;  Surgeon: Rush Landmark Telford Nab., MD;  Location: [place];  Service: Gastroenterology;  Laterality: N/A;  . ESOPHAGOGASTRODUODENOSCOPY (EGD) WITH PROPOFOL N/A 06/18/2019   Procedure: ESOPHAGOGASTRODUODENOSCOPY (EGD) WITH PROPOFOL;  Surgeon: Rush Landmark Telford Nab., MD;  Location: [place];  Service: Gastroenterology;  Laterality: N/A;  . EYE SURGERY    . HEMOSTASIS CLIP PLACEMENT  06/18/2019   Procedure: HEMOSTASIS CLIP PLACEMENT;  Surgeon: Irving Copas., MD;  Location: [place];  Service: Gastroenterology;;  . PARS PLANA VITRECTOMY Right 02/17/2015   Procedure: PARS PLANA VITRECTOMY WITH 25 GAUGE, endolaser, right eye;  Surgeon: Milus Height, MD;  Location: [facility];  Service: Ophthalmology;  Laterality: Right;  fluid pack lot #  [id number] H endolaser: 200w, [id number]  . PERCUTANEOUS STENT INTERVENTION     right leg  . PERIPHERAL VASCULAR BALLOON ANGIOPLASTY Left 03/20/2017   Procedure: PERIPHERAL VASCULAR BALLOON ANGIOPLASTY;  Surgeon: Katha Cabal, MD;  Location: [place] [facility];  Service: Cardiovascular;  Laterality: Left;  . PERIPHERALLY INSERTED CENTRAL CATHETER INSERTION  2013  . POLYPECTOMY  06/18/2019   Procedure: POLYPECTOMY;  Surgeon: Mansouraty, Telford Nab., MD;  Location: [place];  Service: Gastroenterology;;  . Lia Foyer LIFTING INJECTION  06/18/2019   Procedure: SUBMUCOSAL LIFTING INJECTION;  Surgeon: Irving Copas., MD;  Location: [place];  Service: Gastroenterology;;  . TOE AMPUTATION     left 5th, right 4th and 5th    Family History  Problem Relation Age of Onset  . Heart disease Sister   . Hyperlipidemia Brother   . Colon cancer Neg Hx   . Esophageal cancer Neg Hx   . Inflammatory bowel  disease Neg Hx   . Liver disease Neg Hx   . Pancreatic cancer Neg Hx   . Rectal cancer Neg Hx   . Stomach cancer Neg Hx     Social History   Socioeconomic History  . Marital status: Divorced    Spouse name: Not on file  . Number of children: 0  . Years of education: Not on file  . Highest education level: Not on file  Occupational History  . Occupation: [name]    Comment: Disabled since 2000's  Tobacco Use  . Smoking status: Never Smoker  . Smokeless tobacco: Never Used  Vaping Use  . Vaping Use: Never used  Substance and Sexual Activity  . Alcohol use: No    Alcohol/week: 0.0 standard drinks  . Drug use: No  . Sexual activity: Not on file  Other Topics Concern  . Not on file  Social History Narrative   Has living will   Jeannetta Nap, sister, as health care POA.   Would accept resuscitation attempts   Not sure about tube feeds   Social Determinants of Health   Financial Resource Strain:   . Difficulty of Paying Living Expenses:   Food Insecurity:   .  Worried About Charity fundraiser in the Last Year:   . Arboriculturist in the Last Year:   Transportation Needs:   . Film/video editor (Medical):   Marland Kitchen Lack of Transportation (Non-Medical):   Physical Activity:   . Days of Exercise per Week:   . Minutes of Exercise per Session:   Stress:   . Feeling of Stress :   Social Connections:   . Frequency of Communication with Friends and Family:   . Frequency of Social Gatherings with Friends and Family:   . Attends Religious Services:   . Active Member of Clubs or Organizations:   . Attends Archivist Meetings:   Marland Kitchen Marital Status:   Intimate Partner Violence:   . Fear of Current or Ex-Partner:   . Emotionally Abused:   Marland Kitchen Physically Abused:   . Sexually Abused:    Review of Systems Own teeth --hasn't seen a dentist in a while (discussed) No rash or skin problems No problems with amputations from the past Sleeps okay Appetite is "so-so" Has lost a few pounds No heartburn or rare. No dysphagia Bowels moving well--no blood Voids okay. Nocturia x 1-2. Flow is okay No sig back or joint pains----will have some general aching in back at times    Objective:   Physical Exam Constitutional:      Appearance: Normal appearance.  HENT:     Head: Normocephalic and atraumatic.     Mouth/Throat:     Mouth: Mucous membranes are moist.     Comments: No lesions Cardiovascular:     Rate and Rhythm: Normal rate and regular rhythm.     Heart sounds: No murmur heard.  No gallop.      Comments: Faint DP pulses Pulmonary:     Effort: Pulmonary effort is normal.     Breath sounds: Normal breath sounds. No wheezing or rales.  Abdominal:     Palpations: Abdomen is soft.     Tenderness: There is no abdominal tenderness.  Musculoskeletal:     Cervical back: Neck supple.     Right lower leg: No edema.     Left lower leg: No edema.     Comments: Missing toes on each foot No ulcers  Lymphadenopathy:  Cervical: No cervical  adenopathy.  Skin:    Comments: No foot lesions  Neurological:     Mental Status: He is alert and oriented to person, place, and time.     Comments: President-- [address]" [phone]? [id number] Recall 3/3  Psychiatric:        Mood and Affect: Mood normal.        Behavior: Behavior normal.            Assessment & Plan:

## 2019-08-07 NOTE — Assessment & Plan Note (Signed)
I have personally reviewed the Medicare Annual Wellness questionnaire and have noted 1. The patient's medical and social history 2. Their use of alcohol, tobacco or illicit drugs 3. Their current medications and supplements 4. The patient's functional ability including ADL's, fall risks, home safety risks and hearing or visual             impairment. 5. Diet and physical activities 6. Evidence for depression or mood disorders  The patients weight, height, BMI and visual acuity have been recorded in the chart I have made referrals, counseling and provided education to the patient based review of the above and I have provided the pt with a written personalized care plan for preventive services.  I have provided you with a copy of your personalized plan for preventive services. Please take the time to review along with your updated medication list.  Had COVID vaccine Colon due again in a year Will check PSA after discussion Flu vaccine in the fall Needs dental care Discussed exercise

## 2019-08-07 NOTE — Assessment & Plan Note (Signed)
BMI 38 with DM, HTN, CAD, PAD, etc Discussed lifestyle measures Has lost a little weight

## 2019-08-08 LAB — CBC
HCT: 37 % — ABNORMAL LOW (ref 39.0–52.0)
Hemoglobin: 12.2 g/dL — ABNORMAL LOW (ref 13.0–17.0)
MCHC: 33 g/dL (ref 30.0–36.0)
MCV: 81.1 fl (ref 78.0–100.0)
Platelets: 306 10*3/uL (ref 150.0–400.0)
RBC: 4.57 Mil/uL (ref 4.22–5.81)
RDW: 15.4 % (ref 11.5–15.5)
WBC: 8 10*3/uL (ref 4.0–10.5)

## 2019-08-08 LAB — HEPATIC FUNCTION PANEL
ALT: 15 U/L (ref 0–53)
AST: 19 U/L (ref 0–37)
Albumin: 4.3 g/dL (ref 3.5–5.2)
Alkaline Phosphatase: 62 U/L (ref 39–117)
Bilirubin, Direct: 0.1 mg/dL (ref 0.0–0.3)
Total Bilirubin: 0.5 mg/dL (ref 0.2–1.2)
Total Protein: 6.7 g/dL (ref 6.0–8.3)

## 2019-08-08 LAB — RENAL FUNCTION PANEL
Albumin: 4.3 g/dL (ref 3.5–5.2)
BUN: 20 mg/dL (ref 6–23)
CO2: 29 mEq/L (ref 19–32)
Calcium: 9.6 mg/dL (ref 8.4–10.5)
Chloride: 99 mEq/L (ref 96–112)
Creatinine, Ser: 1.23 mg/dL (ref 0.40–1.50)
GFR: 58.59 mL/min — ABNORMAL LOW (ref >60.00)
Glucose, Bld: 176 mg/dL — ABNORMAL HIGH (ref 70–99)
Phosphorus: 3.6 mg/dL (ref 2.3–4.6)
Potassium: 4.6 mEq/L (ref 3.5–5.1)
Sodium: 137 mEq/L (ref 135–145)

## 2019-08-08 LAB — LIPID PANEL
Cholesterol: 118 mg/dL (ref 0–200)
HDL: 35.1 mg/dL — ABNORMAL LOW (ref >39.00)
LDL Cholesterol: 60 mg/dL (ref 0–99)
NonHDL: 83.02
Total CHOL/HDL Ratio: 3
Triglycerides: 114 mg/dL (ref 0.0–149.0)
VLDL: 22.8 mg/dL (ref 0.0–40.0)

## 2019-08-08 LAB — IBC + FERRITIN
Ferritin: 19 ng/mL — ABNORMAL LOW (ref 22.0–322.0)
Iron: 78 ug/dL (ref 42–165)
Saturation Ratios: 17.8 % — ABNORMAL LOW (ref 20.0–50.0)
Transferrin: 313 mg/dL (ref 212.0–360.0)

## 2019-08-08 LAB — PSA, MEDICARE: PSA: 0.17 ng/ml (ref 0.10–4.00)

## 2019-08-08 LAB — HEMOGLOBIN A1C: Hgb A1c MFr Bld: 6.6 % — ABNORMAL HIGH (ref 4.6–6.5)

## 2019-08-11 ENCOUNTER — Other Ambulatory Visit: Payer: Self-pay

## 2019-08-11 DIAGNOSIS — D509 Iron deficiency anemia, unspecified: Secondary | ICD-10-CM

## 2019-08-14 DIAGNOSIS — H35033 Hypertensive retinopathy, bilateral: Secondary | ICD-10-CM | POA: Diagnosis not present

## 2019-08-14 DIAGNOSIS — H1820 Unspecified corneal edema: Secondary | ICD-10-CM | POA: Diagnosis not present

## 2019-08-14 DIAGNOSIS — E113511 Type 2 diabetes mellitus with proliferative diabetic retinopathy with macular edema, right eye: Secondary | ICD-10-CM | POA: Diagnosis not present

## 2019-08-14 DIAGNOSIS — H31093 Other chorioretinal scars, bilateral: Secondary | ICD-10-CM | POA: Diagnosis not present

## 2019-08-14 DIAGNOSIS — E113513 Type 2 diabetes mellitus with proliferative diabetic retinopathy with macular edema, bilateral: Secondary | ICD-10-CM | POA: Diagnosis not present

## 2019-08-20 ENCOUNTER — Other Ambulatory Visit: Payer: Self-pay | Admitting: Cardiovascular Disease

## 2019-09-13 DIAGNOSIS — I951 Orthostatic hypotension: Secondary | ICD-10-CM | POA: Diagnosis not present

## 2019-09-13 DIAGNOSIS — K5900 Constipation, unspecified: Secondary | ICD-10-CM | POA: Diagnosis not present

## 2019-09-13 DIAGNOSIS — Z794 Long term (current) use of insulin: Secondary | ICD-10-CM | POA: Diagnosis not present

## 2019-09-13 DIAGNOSIS — E1151 Type 2 diabetes mellitus with diabetic peripheral angiopathy without gangrene: Secondary | ICD-10-CM | POA: Diagnosis not present

## 2019-09-13 DIAGNOSIS — R69 Illness, unspecified: Secondary | ICD-10-CM | POA: Diagnosis not present

## 2019-09-13 DIAGNOSIS — Z7901 Long term (current) use of anticoagulants: Secondary | ICD-10-CM | POA: Diagnosis not present

## 2019-09-13 DIAGNOSIS — E1165 Type 2 diabetes mellitus with hyperglycemia: Secondary | ICD-10-CM | POA: Diagnosis not present

## 2019-09-13 DIAGNOSIS — I10 Essential (primary) hypertension: Secondary | ICD-10-CM | POA: Diagnosis not present

## 2019-09-25 DIAGNOSIS — H35371 Puckering of macula, right eye: Secondary | ICD-10-CM | POA: Diagnosis not present

## 2019-09-25 DIAGNOSIS — E113511 Type 2 diabetes mellitus with proliferative diabetic retinopathy with macular edema, right eye: Secondary | ICD-10-CM | POA: Diagnosis not present

## 2019-09-25 DIAGNOSIS — S0501XA Injury of conjunctiva and corneal abrasion without foreign body, right eye, initial encounter: Secondary | ICD-10-CM | POA: Diagnosis not present

## 2019-09-25 DIAGNOSIS — E113592 Type 2 diabetes mellitus with proliferative diabetic retinopathy without macular edema, left eye: Secondary | ICD-10-CM | POA: Diagnosis not present

## 2019-10-07 DIAGNOSIS — H4051X4 Glaucoma secondary to other eye disorders, right eye, indeterminate stage: Secondary | ICD-10-CM | POA: Diagnosis not present

## 2019-10-15 DIAGNOSIS — H4051X4 Glaucoma secondary to other eye disorders, right eye, indeterminate stage: Secondary | ICD-10-CM | POA: Diagnosis not present

## 2019-10-15 DIAGNOSIS — E1142 Type 2 diabetes mellitus with diabetic polyneuropathy: Secondary | ICD-10-CM | POA: Diagnosis not present

## 2019-11-06 DIAGNOSIS — Z794 Long term (current) use of insulin: Secondary | ICD-10-CM | POA: Diagnosis not present

## 2019-11-06 DIAGNOSIS — B351 Tinea unguium: Secondary | ICD-10-CM | POA: Diagnosis not present

## 2019-11-06 DIAGNOSIS — L851 Acquired keratosis [keratoderma] palmaris et plantaris: Secondary | ICD-10-CM | POA: Diagnosis not present

## 2019-11-06 DIAGNOSIS — E1140 Type 2 diabetes mellitus with diabetic neuropathy, unspecified: Secondary | ICD-10-CM | POA: Diagnosis not present

## 2019-11-13 DIAGNOSIS — E113511 Type 2 diabetes mellitus with proliferative diabetic retinopathy with macular edema, right eye: Secondary | ICD-10-CM | POA: Diagnosis not present

## 2019-11-28 ENCOUNTER — Other Ambulatory Visit: Payer: Self-pay | Admitting: Cardiovascular Disease

## 2019-11-28 ENCOUNTER — Other Ambulatory Visit: Payer: Self-pay | Admitting: Internal Medicine

## 2020-01-08 DIAGNOSIS — E113592 Type 2 diabetes mellitus with proliferative diabetic retinopathy without macular edema, left eye: Secondary | ICD-10-CM | POA: Diagnosis not present

## 2020-01-08 DIAGNOSIS — H35373 Puckering of macula, bilateral: Secondary | ICD-10-CM | POA: Diagnosis not present

## 2020-01-08 DIAGNOSIS — S0501XA Injury of conjunctiva and corneal abrasion without foreign body, right eye, initial encounter: Secondary | ICD-10-CM | POA: Diagnosis not present

## 2020-01-08 DIAGNOSIS — E113511 Type 2 diabetes mellitus with proliferative diabetic retinopathy with macular edema, right eye: Secondary | ICD-10-CM | POA: Diagnosis not present

## 2020-01-14 DIAGNOSIS — E1142 Type 2 diabetes mellitus with diabetic polyneuropathy: Secondary | ICD-10-CM | POA: Diagnosis not present

## 2020-02-05 ENCOUNTER — Ambulatory Visit (INDEPENDENT_AMBULATORY_CARE_PROVIDER_SITE_OTHER): Payer: Medicare HMO | Admitting: Internal Medicine

## 2020-02-05 ENCOUNTER — Other Ambulatory Visit: Payer: Self-pay

## 2020-02-05 ENCOUNTER — Encounter: Payer: Self-pay | Admitting: Internal Medicine

## 2020-02-05 VITALS — BP 138/40 | HR 83 | Temp 97.0°F | Ht 69.5 in | Wt 263.8 lb

## 2020-02-05 DIAGNOSIS — I25119 Atherosclerotic heart disease of native coronary artery with unspecified angina pectoris: Secondary | ICD-10-CM

## 2020-02-05 DIAGNOSIS — E1142 Type 2 diabetes mellitus with diabetic polyneuropathy: Secondary | ICD-10-CM

## 2020-02-05 DIAGNOSIS — S98132A Complete traumatic amputation of one left lesser toe, initial encounter: Secondary | ICD-10-CM | POA: Diagnosis not present

## 2020-02-05 DIAGNOSIS — I82409 Acute embolism and thrombosis of unspecified deep veins of unspecified lower extremity: Secondary | ICD-10-CM

## 2020-02-05 DIAGNOSIS — S98131A Complete traumatic amputation of one right lesser toe, initial encounter: Secondary | ICD-10-CM | POA: Diagnosis not present

## 2020-02-05 LAB — POCT GLYCOSYLATED HEMOGLOBIN (HGB A1C): Hemoglobin A1C: 7.4 % — AB (ref 4.0–5.6)

## 2020-02-05 LAB — HM DIABETES FOOT EXAM

## 2020-02-05 NOTE — Progress Notes (Signed)
foll

## 2020-02-05 NOTE — Assessment & Plan Note (Signed)
2 toes gone No lesions now

## 2020-02-05 NOTE — Assessment & Plan Note (Addendum)
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

## 2020-02-05 NOTE — Assessment & Plan Note (Signed)
2 toes gone Site looks clean

## 2020-02-05 NOTE — Assessment & Plan Note (Signed)
DOE seems to be an anginal equivalent--but stable Is on statin, carvedilol, losartan

## 2020-02-05 NOTE — Progress Notes (Signed)
Subjective:    Patient ID: Benjamin Davidson, male    DOB: [birth year], 70 y.o.   MRN: [id number]  HPI Here for follow up of diabetes and other chronic health conditions This visit occurred during the SARS-CoV-2 public health emergency.  Safety protocols were in place, including screening questions prior to the visit, additional usage of staff PPE, and extensive cleaning of exam room while observing appropriate contact time as indicated for disinfecting solutions.   Checks sugars --110 this morning, but usually higher On 35 units of insulin bid Keeps up eye doctor No foot pain, tingling or numbness  No chest pain No SOB at rest---but easy DOE (like carrying groceries) Mild edema No dizziness or syncope  Current Outpatient Medications on File Prior to Visit  Medication Sig Dispense Refill  . aspirin EC 81 MG tablet Take 1 tablet (81 mg total) by mouth daily. 150 tablet 2  . carvedilol (COREG) 6.25 MG tablet TAKE 1 TABLET BY MOUTH TWICE A DAY 180 tablet 3  . ferrous gluconate (FERGON) 324 MG tablet TAKE 1 TABLET BY MOUTH ONCE A DAY 30 tablet 0  . furosemide (LASIX) 20 MG tablet TAKE 1 TABLET BY MOUTH ONCE A DAY. MAY TAKE ONE ADDITIONAL TABLET IF INCREASED SWELLING. 180 tablet 1  . losartan (COZAAR) 25 MG tablet TAKE 1 TABLET BY MOUTH ONCE A DAY 90 tablet 3  . NOVOLIN 70/30 (70-30) 100 UNIT/ML injection INJECT 35 UNITS SUBCUTANEOUSLY TWICE A DAY AS DIRECTED 20 mL 11  . omeprazole (PRILOSEC) 40 MG capsule Take 1 capsule (40 mg total) by mouth daily before breakfast. 30 capsule 4  . potassium chloride (KLOR-CON) 10 MEQ tablet TAKE 1 TABLET BY MOUTH ONCE A DAY 90 tablet 3  . rosuvastatin (CRESTOR) 20 MG tablet TAKE 1 TABLET BY MOUTH ONCE A DAY 90 tablet 3  . sertraline (ZOLOFT) 100 MG tablet TAKE 1 TABLET BY MOUTH ONCE DAILY 90 tablet 3  . triamcinolone cream (KENALOG) 0.1 % Apply 1 application topically 2 (two) times daily as needed. (Patient taking differently: Apply 1 application topically  2 (two) times daily as needed (irritation).) 45 g 1  . TRUEPLUS INSULIN SYRINGE 31G X 5/16" 0.5 ML MISC USE AS DIRECTED TWICE DAILY 100 each 7  . XARELTO 10 MG TABS tablet TAKE 1 TABLET BY MOUTH ONCE A DAY 30 tablet 11   No current facility-administered medications on file prior to visit.    Allergies  Allergen Reactions  . Losartan Other (See Comments)    Not an allergy -  Not an allergy -    . Lisinopril     Cough Flu like symptoms     Past Medical History:  Diagnosis Date  . Anemia    hx of  . Arthritis   . Asthma    childhood  . Cataract   . CHF (congestive heart failure) ([facility])   . Coronary artery disease   . DM (diabetes mellitus), type 2, uncontrolled, periph vascular complic ([facility])    Type II  . Edema    feet/legs  . Hx of colonic polyps   . Hypertension   . Kidney stones   . Left leg DVT ([facility])    2000s  . MDD (recurrent major depressive disorder) in remission ([facility])   . Mitral regurgitation 08/10/2011  . Peripheral vascular disease ([facility])    Left toe amputations. Dr Wyn Quaker  . Pulmonary embolism ([facility]) 09/05/2005  . Pure hypercholesterolemia   . Shortness of breath dyspnea   .  Syncope    resolved  . Wears glasses     Past Surgical History:  Procedure Laterality Date  . AMPUTATION TOE Left 03/17/2017   Procedure: FIRST RAY RESECTION LEFT FOOT;  Surgeon: Sharlotte Alamo, DPM;  Location: [facility];  Service: Podiatry;  Laterality: Left;  . BIOPSY  06/18/2019   Procedure: BIOPSY;  Surgeon: Rush Landmark Telford Nab., MD;  Location: [place];  Service: Gastroenterology;;  . CARDIAC CATHETERIZATION    . CATARACT EXTRACTION W/PHACO Left 06/03/2015   Procedure: CATARACT EXTRACTION PHACO AND INTRAOCULAR LENS PLACEMENT (IOC);  Surgeon: Eulogio Bear, MD;  Location: [facility];  Service: Ophthalmology;  Laterality: Left;  Lot# [id number] H [place]: 1.06 AP%: 5.5 CDE: 3.69  . CATARACT EXTRACTION W/PHACO Right 12/10/2017   Procedure: Aborted phacoemusification of the right eye.  No  intraocular lens was implated.;  Surgeon: Eulogio Bear, MD;  Location: [place];  Service: Ophthalmology;  Laterality: Right;  Diabetic - insulin  . COLONOSCOPY    . COLONOSCOPY WITH PROPOFOL N/A 06/18/2019   Procedure: COLONOSCOPY WITH PROPOFOL;  Surgeon: Rush Landmark Telford Nab., MD;  Location: [place];  Service: Gastroenterology;  Laterality: N/A;  . CORONARY ARTERY BYPASS GRAFT  2008  . ENDOSCOPIC MUCOSAL RESECTION N/A 06/18/2019   Procedure: ENDOSCOPIC MUCOSAL RESECTION;  Surgeon: Rush Landmark Telford Nab., MD;  Location: [place];  Service: Gastroenterology;  Laterality: N/A;  . ESOPHAGOGASTRODUODENOSCOPY (EGD) WITH PROPOFOL N/A 06/18/2019   Procedure: ESOPHAGOGASTRODUODENOSCOPY (EGD) WITH PROPOFOL;  Surgeon: Rush Landmark Telford Nab., MD;  Location: [place];  Service: Gastroenterology;  Laterality: N/A;  . EYE SURGERY    . HEMOSTASIS CLIP PLACEMENT  06/18/2019   Procedure: HEMOSTASIS CLIP PLACEMENT;  Surgeon: Irving Copas., MD;  Location: [place];  Service: Gastroenterology;;  . PARS PLANA VITRECTOMY Right 02/17/2015   Procedure: PARS PLANA VITRECTOMY WITH 25 GAUGE, endolaser, right eye;  Surgeon: Milus Height, MD;  Location: [facility];  Service: Ophthalmology;  Laterality: Right;  fluid pack lot # [id number] H endolaser: 200w, [id number]  . PERCUTANEOUS STENT INTERVENTION     right leg  . PERIPHERAL VASCULAR BALLOON ANGIOPLASTY Left 03/20/2017   Procedure: PERIPHERAL VASCULAR BALLOON ANGIOPLASTY;  Surgeon: Katha Cabal, MD;  Location: [place] [facility];  Service: Cardiovascular;  Laterality: Left;  . PERIPHERALLY INSERTED CENTRAL CATHETER INSERTION  2013  . POLYPECTOMY  06/18/2019   Procedure: POLYPECTOMY;  Surgeon: Mansouraty, Telford Nab., MD;  Location: [place];  Service: Gastroenterology;;  . Lia Foyer LIFTING INJECTION  06/18/2019   Procedure: SUBMUCOSAL LIFTING INJECTION;  Surgeon: Irving Copas., MD;  Location: [place];   Service: Gastroenterology;;  . TOE AMPUTATION     left 5th, right 4th and 5th    Family History  Problem Relation Age of Onset  . Heart disease Sister   . Hyperlipidemia Brother   . Colon cancer Neg Hx   . Esophageal cancer Neg Hx   . Inflammatory bowel disease Neg Hx   . Liver disease Neg Hx   . Pancreatic cancer Neg Hx   . Rectal cancer Neg Hx   . Stomach cancer Neg Hx     Social History   Socioeconomic History  . Marital status: Divorced    Spouse name: Not on file  . Number of children: 0  . Years of education: Not on file  . Highest education level: Not on file  Occupational History  . Occupation: [name]    Comment: Disabled since 2000's  Tobacco Use  . Smoking status: Never Smoker  . Smokeless  tobacco: Never Used  Vaping Use  . Vaping Use: Never used  Substance and Sexual Activity  . Alcohol use: No    Alcohol/week: 0.0 standard drinks  . Drug use: No  . Sexual activity: Not on file  Other Topics Concern  . Not on file  Social History Narrative   Has living will   Jeannetta Nap, sister, as health care POA.   Would accept resuscitation attempts   Not sure about tube feeds   Social Determinants of Health   Financial Resource Strain: Not on file  Food Insecurity: Not on file  Transportation Needs: Not on file  Physical Activity: Not on file  Stress: Not on file  Social Connections: Not on file  Intimate Partner Violence: Not on file   Review of Systems  Weight is up some again--holidays  Sleeps okay     Objective:   Physical Exam Constitutional:      Appearance: He is obese.  Cardiovascular:     Rate and Rhythm: Normal rate and regular rhythm.     Heart sounds: No murmur heard. No gallop.      Comments: Faint pedal pulses Pulmonary:     Effort: Pulmonary effort is normal.     Breath sounds: Normal breath sounds. No wheezing or rales.  Skin:    Comments: Right 4th and  5th toe amputation site clean Same for 1 and 2 amputations on  left No foot lesions  Neurological:     Mental Status: He is alert.     Comments: Decreased sensation in plantar feet            Assessment & Plan:

## 2020-02-05 NOTE — Assessment & Plan Note (Signed)
Maintained on low dose xarelto

## 2020-02-23 DIAGNOSIS — E113511 Type 2 diabetes mellitus with proliferative diabetic retinopathy with macular edema, right eye: Secondary | ICD-10-CM | POA: Diagnosis not present

## 2020-02-23 DIAGNOSIS — E113592 Type 2 diabetes mellitus with proliferative diabetic retinopathy without macular edema, left eye: Secondary | ICD-10-CM | POA: Diagnosis not present

## 2020-02-27 DIAGNOSIS — U071 COVID-19: Secondary | ICD-10-CM | POA: Diagnosis not present

## 2020-02-27 DIAGNOSIS — J9601 Acute respiratory failure with hypoxia: Secondary | ICD-10-CM | POA: Diagnosis not present

## 2020-02-27 DIAGNOSIS — I639 Cerebral infarction, unspecified: Secondary | ICD-10-CM | POA: Diagnosis not present

## 2020-02-27 DIAGNOSIS — J1289 Other viral pneumonia: Secondary | ICD-10-CM | POA: Diagnosis not present

## 2020-02-28 DIAGNOSIS — J9601 Acute respiratory failure with hypoxia: Secondary | ICD-10-CM | POA: Diagnosis not present

## 2020-02-28 DIAGNOSIS — J1289 Other viral pneumonia: Secondary | ICD-10-CM | POA: Diagnosis not present

## 2020-02-28 DIAGNOSIS — I639 Cerebral infarction, unspecified: Secondary | ICD-10-CM | POA: Diagnosis not present

## 2020-02-28 DIAGNOSIS — U071 COVID-19: Secondary | ICD-10-CM | POA: Diagnosis not present

## 2020-03-01 DIAGNOSIS — I639 Cerebral infarction, unspecified: Secondary | ICD-10-CM | POA: Diagnosis not present

## 2020-03-01 DIAGNOSIS — J1289 Other viral pneumonia: Secondary | ICD-10-CM | POA: Diagnosis not present

## 2020-03-01 DIAGNOSIS — J9601 Acute respiratory failure with hypoxia: Secondary | ICD-10-CM | POA: Diagnosis not present

## 2020-03-01 DIAGNOSIS — U071 COVID-19: Secondary | ICD-10-CM | POA: Diagnosis not present

## 2020-03-02 DIAGNOSIS — J1289 Other viral pneumonia: Secondary | ICD-10-CM | POA: Diagnosis not present

## 2020-03-02 DIAGNOSIS — J9601 Acute respiratory failure with hypoxia: Secondary | ICD-10-CM | POA: Diagnosis not present

## 2020-03-02 DIAGNOSIS — I639 Cerebral infarction, unspecified: Secondary | ICD-10-CM | POA: Diagnosis not present

## 2020-03-02 DIAGNOSIS — U071 COVID-19: Secondary | ICD-10-CM | POA: Diagnosis not present

## 2020-03-03 DIAGNOSIS — J9601 Acute respiratory failure with hypoxia: Secondary | ICD-10-CM | POA: Diagnosis not present

## 2020-03-03 DIAGNOSIS — U071 COVID-19: Secondary | ICD-10-CM | POA: Diagnosis not present

## 2020-03-03 DIAGNOSIS — I639 Cerebral infarction, unspecified: Secondary | ICD-10-CM | POA: Diagnosis not present

## 2020-03-03 DIAGNOSIS — J1289 Other viral pneumonia: Secondary | ICD-10-CM | POA: Diagnosis not present

## 2020-03-04 DIAGNOSIS — J9601 Acute respiratory failure with hypoxia: Secondary | ICD-10-CM | POA: Diagnosis not present

## 2020-03-04 DIAGNOSIS — I639 Cerebral infarction, unspecified: Secondary | ICD-10-CM | POA: Diagnosis not present

## 2020-03-04 DIAGNOSIS — J1289 Other viral pneumonia: Secondary | ICD-10-CM | POA: Diagnosis not present

## 2020-03-04 DIAGNOSIS — U071 COVID-19: Secondary | ICD-10-CM | POA: Diagnosis not present

## 2020-03-07 DIAGNOSIS — J1289 Other viral pneumonia: Secondary | ICD-10-CM | POA: Diagnosis not present

## 2020-03-07 DIAGNOSIS — U071 COVID-19: Secondary | ICD-10-CM | POA: Diagnosis not present

## 2020-03-07 DIAGNOSIS — I639 Cerebral infarction, unspecified: Secondary | ICD-10-CM | POA: Diagnosis not present

## 2020-03-07 DIAGNOSIS — J9601 Acute respiratory failure with hypoxia: Secondary | ICD-10-CM | POA: Diagnosis not present

## 2020-03-08 DIAGNOSIS — J9601 Acute respiratory failure with hypoxia: Secondary | ICD-10-CM | POA: Diagnosis not present

## 2020-03-08 DIAGNOSIS — J1289 Other viral pneumonia: Secondary | ICD-10-CM | POA: Diagnosis not present

## 2020-03-08 DIAGNOSIS — U071 COVID-19: Secondary | ICD-10-CM | POA: Diagnosis not present

## 2020-03-08 DIAGNOSIS — I639 Cerebral infarction, unspecified: Secondary | ICD-10-CM | POA: Diagnosis not present

## 2020-03-09 DIAGNOSIS — I639 Cerebral infarction, unspecified: Secondary | ICD-10-CM | POA: Diagnosis not present

## 2020-03-09 DIAGNOSIS — U071 COVID-19: Secondary | ICD-10-CM | POA: Diagnosis not present

## 2020-03-09 DIAGNOSIS — J9601 Acute respiratory failure with hypoxia: Secondary | ICD-10-CM | POA: Diagnosis not present

## 2020-03-09 DIAGNOSIS — J1289 Other viral pneumonia: Secondary | ICD-10-CM | POA: Diagnosis not present

## 2020-03-10 DIAGNOSIS — J9601 Acute respiratory failure with hypoxia: Secondary | ICD-10-CM | POA: Diagnosis not present

## 2020-03-10 DIAGNOSIS — J1289 Other viral pneumonia: Secondary | ICD-10-CM | POA: Diagnosis not present

## 2020-03-10 DIAGNOSIS — H1820 Unspecified corneal edema: Secondary | ICD-10-CM | POA: Diagnosis not present

## 2020-03-10 DIAGNOSIS — U071 COVID-19: Secondary | ICD-10-CM | POA: Diagnosis not present

## 2020-03-10 DIAGNOSIS — I639 Cerebral infarction, unspecified: Secondary | ICD-10-CM | POA: Diagnosis not present

## 2020-04-08 DIAGNOSIS — E113592 Type 2 diabetes mellitus with proliferative diabetic retinopathy without macular edema, left eye: Secondary | ICD-10-CM | POA: Diagnosis not present

## 2020-04-08 DIAGNOSIS — E113511 Type 2 diabetes mellitus with proliferative diabetic retinopathy with macular edema, right eye: Secondary | ICD-10-CM | POA: Diagnosis not present

## 2020-04-08 DIAGNOSIS — H1820 Unspecified corneal edema: Secondary | ICD-10-CM | POA: Diagnosis not present

## 2020-04-08 DIAGNOSIS — H35373 Puckering of macula, bilateral: Secondary | ICD-10-CM | POA: Diagnosis not present

## 2020-04-13 DIAGNOSIS — J1289 Other viral pneumonia: Secondary | ICD-10-CM | POA: Diagnosis not present

## 2020-04-13 DIAGNOSIS — I639 Cerebral infarction, unspecified: Secondary | ICD-10-CM | POA: Diagnosis not present

## 2020-04-13 DIAGNOSIS — U071 COVID-19: Secondary | ICD-10-CM | POA: Diagnosis not present

## 2020-04-13 DIAGNOSIS — J9601 Acute respiratory failure with hypoxia: Secondary | ICD-10-CM | POA: Diagnosis not present

## 2020-04-14 DIAGNOSIS — E1142 Type 2 diabetes mellitus with diabetic polyneuropathy: Secondary | ICD-10-CM | POA: Diagnosis not present

## 2020-05-06 ENCOUNTER — Other Ambulatory Visit: Payer: Self-pay | Admitting: Cardiovascular Disease

## 2020-05-19 DIAGNOSIS — H35373 Puckering of macula, bilateral: Secondary | ICD-10-CM | POA: Diagnosis not present

## 2020-05-19 DIAGNOSIS — E113592 Type 2 diabetes mellitus with proliferative diabetic retinopathy without macular edema, left eye: Secondary | ICD-10-CM | POA: Diagnosis not present

## 2020-05-19 DIAGNOSIS — E113511 Type 2 diabetes mellitus with proliferative diabetic retinopathy with macular edema, right eye: Secondary | ICD-10-CM | POA: Diagnosis not present

## 2020-05-23 DIAGNOSIS — I110 Hypertensive heart disease with heart failure: Secondary | ICD-10-CM | POA: Diagnosis not present

## 2020-05-23 DIAGNOSIS — I509 Heart failure, unspecified: Secondary | ICD-10-CM | POA: Diagnosis not present

## 2020-05-23 DIAGNOSIS — Z89421 Acquired absence of other right toe(s): Secondary | ICD-10-CM | POA: Diagnosis not present

## 2020-05-23 DIAGNOSIS — Z89412 Acquired absence of left great toe: Secondary | ICD-10-CM | POA: Diagnosis not present

## 2020-05-23 DIAGNOSIS — E261 Secondary hyperaldosteronism: Secondary | ICD-10-CM | POA: Diagnosis not present

## 2020-05-23 DIAGNOSIS — Z794 Long term (current) use of insulin: Secondary | ICD-10-CM | POA: Diagnosis not present

## 2020-05-23 DIAGNOSIS — E1142 Type 2 diabetes mellitus with diabetic polyneuropathy: Secondary | ICD-10-CM | POA: Diagnosis not present

## 2020-05-23 DIAGNOSIS — E1136 Type 2 diabetes mellitus with diabetic cataract: Secondary | ICD-10-CM | POA: Diagnosis not present

## 2020-05-23 DIAGNOSIS — Z008 Encounter for other general examination: Secondary | ICD-10-CM | POA: Diagnosis not present

## 2020-05-23 DIAGNOSIS — R69 Illness, unspecified: Secondary | ICD-10-CM | POA: Diagnosis not present

## 2020-06-02 ENCOUNTER — Other Ambulatory Visit: Payer: Self-pay | Admitting: Cardiovascular Disease

## 2020-06-02 NOTE — Telephone Encounter (Signed)
Please call pt to schedule 1 year f/u appt with Dr. Adora Fridge for refills. Thank you!

## 2020-06-02 NOTE — Telephone Encounter (Signed)
L mom to schedule recall appt

## 2020-06-08 NOTE — Telephone Encounter (Signed)
Attempted to schedule.  LMOV to call office.  ° °

## 2020-06-10 ENCOUNTER — Other Ambulatory Visit: Payer: Self-pay | Admitting: Internal Medicine

## 2020-06-10 ENCOUNTER — Ambulatory Visit: Payer: Medicare HMO | Admitting: Cardiovascular Disease

## 2020-06-10 ENCOUNTER — Other Ambulatory Visit: Payer: Self-pay

## 2020-06-10 ENCOUNTER — Encounter: Payer: Self-pay | Admitting: Cardiovascular Disease

## 2020-06-10 VITALS — BP 136/80 | HR 72 | Ht 69.5 in | Wt 264.0 lb

## 2020-06-10 DIAGNOSIS — I739 Peripheral vascular disease, unspecified: Secondary | ICD-10-CM

## 2020-06-10 DIAGNOSIS — E1142 Type 2 diabetes mellitus with diabetic polyneuropathy: Secondary | ICD-10-CM

## 2020-06-10 DIAGNOSIS — I10 Essential (primary) hypertension: Secondary | ICD-10-CM

## 2020-06-10 DIAGNOSIS — I25118 Atherosclerotic heart disease of native coronary artery with other forms of angina pectoris: Secondary | ICD-10-CM

## 2020-06-10 DIAGNOSIS — E7800 Pure hypercholesterolemia, unspecified: Secondary | ICD-10-CM | POA: Diagnosis not present

## 2020-06-10 MED ORDER — LOSARTAN POTASSIUM 25 MG PO TABS
25.0000 mg | ORAL_TABLET | Freq: Every day | ORAL | 3 refills | Status: DC
Start: 2020-06-10 — End: 2021-06-03

## 2020-06-10 NOTE — Progress Notes (Signed)
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 Other    12 month f/u no complaints today. Meds reviewed verbally with pt.    History of Present Illness:    Benjamin Davidson is a 70 y.o. 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,   diabetes,  sedentary lifestyle,  history of DVT in the left leg, on xarelto who presents for routine followup of his coronary artery disease, peripheral arterial disease  Last seen by myself in the office May 2021 At that time weight was climbing,  up 20 pounds in 2 years 2019 to 2021 Poor diet, less exercise Chronic mild shortness of breath on exertion  Boil left cheek/nose  Reports that he does not have any hobbies Goes to [employer] to do his shopping Denies any chest pain shortness of breath, anginal symptoms No recent admissions to the hospital Wants to try keto pill to lose weight Weight continues to run high, likes bread  Labs reviewed HBA1C 7.4  Cholesterol well controlled 10 years, total cholesterol 120s  EKG personally reviewed by myself on todays visit NSR rate 72, LBBB  Other past medical history reviewed  hospital admission February 2019 Left great toe osteomyelitis with ulceration Amputation left great toe,  blister that never healed  Vascular surgery March 20, 2017 Angioplasty of left posterior tibial Angioplasty left peroneal artery  History of osteomyelitis requiring wound VAC on his right foot, 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 He had gangrene of the fifth toe on the right with acute renal failure and dehydration admitted 08/07/2011  Prior CV studies:   The  following studies were reviewed today:  No recent stress test No recent catheterization  last stress test July 2008 showing mild ischemia in the mid anteroseptal, apical anterior and apical septal region.   Ejection fraction 40%. This test was done post bypass. No cardiac catheterization was done in followup as he had no symptoms.  Past Medical History:  Diagnosis Date  . Anemia    hx of  . Arthritis   . Asthma    childhood  . Cataract   . CHF (congestive heart failure) ([place])   . Coronary artery disease   . DM (diabetes mellitus), type 2, uncontrolled, periph vascular complic ([place])    Type II  . Edema    feet/legs  . Hx of colonic polyps   . Hypertension   . Kidney stones   . Left leg DVT ([place])    2000s  . MDD (recurrent major depressive disorder) in remission ([place])   . Mitral regurgitation 08/10/2011  . Peripheral vascular disease (HCC)    Left toe amputations. Dr Lucky Cowboy  . Pulmonary embolism ([place]) 09/05/2005  . Pure hypercholesterolemia   . Shortness of breath dyspnea   . Syncope    resolved  . Wears glasses    Past Surgical History:  Procedure Laterality Date  . AMPUTATION TOE Left 03/17/2017   Procedure: FIRST RAY RESECTION LEFT FOOT;  Surgeon: Sharlotte Alamo, DPM;  Location: [facility];  Service: Podiatry;  Laterality: Left;  . BIOPSY  06/18/2019   Procedure:  BIOPSY;  Surgeon: Irving Copas., MD;  Location: [place];  Service: Gastroenterology;;  . CARDIAC CATHETERIZATION    . CATARACT EXTRACTION W/PHACO Left 06/03/2015   Procedure: CATARACT EXTRACTION PHACO AND INTRAOCULAR LENS PLACEMENT (IOC);  Surgeon: Eulogio Bear, MD;  Location: [facility];  Service: Ophthalmology;  Laterality: Left;  Lot# [id number] H [place]: 1.06 AP%: 5.5 CDE: 3.69  . CATARACT EXTRACTION W/PHACO Right 12/10/2017   Procedure: Aborted phacoemusification of the right eye.  No intraocular lens was implated.;  Surgeon: Eulogio Bear, MD;  Location: [place];  Service:  Ophthalmology;  Laterality: Right;  Diabetic - insulin  . COLONOSCOPY    . COLONOSCOPY WITH PROPOFOL N/A 06/18/2019   Procedure: COLONOSCOPY WITH PROPOFOL;  Surgeon: Rush Landmark Telford Nab., MD;  Location: [place];  Service: Gastroenterology;  Laterality: N/A;  . CORONARY ARTERY BYPASS GRAFT  2008  . ENDOSCOPIC MUCOSAL RESECTION N/A 06/18/2019   Procedure: ENDOSCOPIC MUCOSAL RESECTION;  Surgeon: Rush Landmark Telford Nab., MD;  Location: [place];  Service: Gastroenterology;  Laterality: N/A;  . ESOPHAGOGASTRODUODENOSCOPY (EGD) WITH PROPOFOL N/A 06/18/2019   Procedure: ESOPHAGOGASTRODUODENOSCOPY (EGD) WITH PROPOFOL;  Surgeon: Rush Landmark Telford Nab., MD;  Location: [place];  Service: Gastroenterology;  Laterality: N/A;  . EYE SURGERY    . HEMOSTASIS CLIP PLACEMENT  06/18/2019   Procedure: HEMOSTASIS CLIP PLACEMENT;  Surgeon: Irving Copas., MD;  Location: [place];  Service: Gastroenterology;;  . PARS PLANA VITRECTOMY Right 02/17/2015   Procedure: PARS PLANA VITRECTOMY WITH 25 GAUGE, endolaser, right eye;  Surgeon: Milus Height, MD;  Location: [facility];  Service: Ophthalmology;  Laterality: Right;  fluid pack lot # [id number] H endolaser: 200w, [id number]  . PERCUTANEOUS STENT INTERVENTION     right leg  . PERIPHERAL VASCULAR BALLOON ANGIOPLASTY Left 03/20/2017   Procedure: PERIPHERAL VASCULAR BALLOON ANGIOPLASTY;  Surgeon: Katha Cabal, MD;  Location: [place] [facility];  Service: Cardiovascular;  Laterality: Left;  . PERIPHERALLY INSERTED CENTRAL CATHETER INSERTION  2013  . POLYPECTOMY  06/18/2019   Procedure: POLYPECTOMY;  Surgeon: Mansouraty, Telford Nab., MD;  Location: [place];  Service: Gastroenterology;;  . Lia Foyer LIFTING INJECTION  06/18/2019   Procedure: SUBMUCOSAL LIFTING INJECTION;  Surgeon: Irving Copas., MD;  Location: [place];  Service: Gastroenterology;;  . TOE AMPUTATION     left 5th, right 4th and 5th    Current Outpatient  Medications on File Prior to Visit  Medication Sig Dispense Refill  . aspirin EC 81 MG tablet Take 1 tablet (81 mg total) by mouth daily. 150 tablet 2  . carvedilol (COREG) 6.25 MG tablet TAKE 1 TABLET BY MOUTH TWICE A DAY 180 tablet 3  . ferrous gluconate (FERGON) 324 MG tablet TAKE 1 TABLET BY MOUTH ONCE A DAY 30 tablet 0  . furosemide (LASIX) 20 MG tablet TAKE 1 TABLET BY MOUTH ONCE A DAY. MAY TAKE ONE ADDITIONAL TABLET IF INCREASED SWELLING. 180 tablet 1  . losartan (COZAAR) 25 MG tablet TAKE 1 TABLET BY MOUTH ONCE A DAY 90 tablet 3  . NOVOLIN 70/30 (70-30) 100 UNIT/ML injection INJECT 35 UNITS SUBCUTANEOUSLY TWICE A DAY AS DIRECTED 20 mL 11  . potassium chloride (KLOR-CON) 10 MEQ tablet TAKE 1 TABLET BY MOUTH ONCE A DAY 90 tablet 0  . rosuvastatin (CRESTOR) 20 MG tablet TAKE 1 TABLET BY MOUTH ONCE A DAY 90 tablet 3  . sertraline (ZOLOFT) 100 MG tablet TAKE 1 TABLET BY MOUTH ONCE DAILY 90 tablet 3  . triamcinolone cream (KENALOG) 0.1 % Apply 1 application  topically 2 (two) times daily as needed. 45 g 1  . TRUEPLUS INSULIN SYRINGE 31G X 5/16" 0.5 ML MISC USE AS DIRECTED TWICE DAILY 100 each 7  . XARELTO 10 MG TABS tablet TAKE 1 TABLET BY MOUTH ONCE A DAY 30 tablet 11   No current facility-administered medications on file prior to visit.     Allergies:   Losartan and Lisinopril   Social History   Tobacco Use  . Smoking status: Never Smoker  . Smokeless tobacco: Never Used  Vaping Use  . Vaping Use: Never used  Substance Use Topics  . Alcohol use: No    Alcohol/week: 0.0 standard drinks  . Drug use: No     Current Outpatient Medications on File Prior to Visit  Medication Sig Dispense Refill  . aspirin EC 81 MG tablet Take 1 tablet (81 mg total) by mouth daily. 150 tablet 2  . carvedilol (COREG) 6.25 MG tablet TAKE 1 TABLET BY MOUTH TWICE A DAY 180 tablet 3  . ferrous gluconate (FERGON) 324 MG tablet TAKE 1 TABLET BY MOUTH ONCE A DAY 30 tablet 0  . furosemide (LASIX) 20 MG  tablet TAKE 1 TABLET BY MOUTH ONCE A DAY. MAY TAKE ONE ADDITIONAL TABLET IF INCREASED SWELLING. 180 tablet 1  . losartan (COZAAR) 25 MG tablet TAKE 1 TABLET BY MOUTH ONCE A DAY 90 tablet 3  . NOVOLIN 70/30 (70-30) 100 UNIT/ML injection INJECT 35 UNITS SUBCUTANEOUSLY TWICE A DAY AS DIRECTED 20 mL 11  . potassium chloride (KLOR-CON) 10 MEQ tablet TAKE 1 TABLET BY MOUTH ONCE A DAY 90 tablet 0  . rosuvastatin (CRESTOR) 20 MG tablet TAKE 1 TABLET BY MOUTH ONCE A DAY 90 tablet 3  . sertraline (ZOLOFT) 100 MG tablet TAKE 1 TABLET BY MOUTH ONCE DAILY 90 tablet 3  . triamcinolone cream (KENALOG) 0.1 % Apply 1 application topically 2 (two) times daily as needed. 45 g 1  . TRUEPLUS INSULIN SYRINGE 31G X 5/16" 0.5 ML MISC USE AS DIRECTED TWICE DAILY 100 each 7  . XARELTO 10 MG TABS tablet TAKE 1 TABLET BY MOUTH ONCE A DAY 30 tablet 11   No current facility-administered medications on file prior to visit.     Family Hx: The patient's family history includes Heart disease in his sister; Hyperlipidemia in his brother. There is no history of Colon cancer, Esophageal cancer, Inflammatory bowel disease, Liver disease, Pancreatic cancer, Rectal cancer, or Stomach cancer.  ROS:   Please see the history of present illness.    Review of Systems  Constitutional: Negative.   HENT: Negative.   Respiratory: Negative.   Cardiovascular: Negative.   Gastrointestinal: Negative.   Musculoskeletal: Negative.   Neurological: Negative.   Psychiatric/Behavioral: Negative.   All other systems reviewed and are negative.    Labs/Other Tests and Data Reviewed:    Recent Labs: 08/07/2019: ALT 15; BUN 20; Creatinine, Ser 1.23; Hemoglobin 12.2; Platelets 306.0; Potassium 4.6; Sodium 137   Recent Lipid Panel Lab Results  Component Value Date/Time   CHOL 118 08/07/2019 03:24 PM   TRIG 114.0 08/07/2019 03:24 PM   HDL 35.10 (L) 08/07/2019 03:24 PM   CHOLHDL 3 08/07/2019 03:24 PM   LDLCALC 60 08/07/2019 03:24 PM    Wt  Readings from Last 3 Encounters:  06/10/20 264 lb (119.7 kg)  02/05/20 263 lb 12.8 oz (119.7 kg)  08/07/19 255 lb (115.7 kg)     Exam:    BP 136/80 (BP Location: Left Arm, Patient Position: Sitting, Cuff  Size: Large)   Pulse 72   Ht 5' 9.5" (1.765 m)   Wt 264 lb (119.7 kg)   SpO2 99%   BMI 38.43 kg/m   Constitutional:  oriented to person, place, and time. No distress.  HENT:  Head: Grossly normal Eyes:  no discharge. No scleral icterus.  Neck: No JVD, no carotid bruits  Cardiovascular: Regular rate and rhythm, no murmurs appreciated Pulmonary/Chest: Clear to auscultation bilaterally, no wheezes or rails Abdominal: Soft.  no distension.  no tenderness.  Musculoskeletal: Normal range of motion Neurological:  normal muscle tone. Coordination normal. No atrophy Skin: Skin warm and dry Psychiatric: normal affect, pleasant  ASSESSMENT & PLAN:    PAD (peripheral artery disease) (HCC) Previously followed by vascular, dr. Ronalee Belts Last blood extremity ultrasound had mild diffuse disease He denies any claudication symptoms, cholesterol at goal Stressed importance of weight loss, walking program, strict diabetes numbers  Morbid obesity ([place]) Discussed diet with him, high calorie intake, very sedentary lifestyle Recommended walking program  Type 2 diabetes, controlled, with peripheral neuropathy (HCC) A1c 7 range Recommended low carbohydrate, low breads  Pure hypercholesterolemia Cholesterol is at goal on the current lipid regimen. No changes to the medications were made.  Essential hypertension Blood pressure is well controlled on today's visit. No changes made to the medications.\  Atherosclerosis of native coronary artery of native heart with stable angina pectoris ([place]) Currently with no symptoms of angina. No further workup at this time. Continue current medication regimen.    Total encounter time more than 25 minutes  Greater than 50% was spent in counseling and  coordination of care with the patient   Signed, Ida Rogue, MD  06/10/2020 4:14 PM    [facility] [address], [place], [place] [address]

## 2020-06-10 NOTE — Telephone Encounter (Signed)
Attempted to schedule.  

## 2020-06-10 NOTE — Telephone Encounter (Signed)
Scheduled

## 2020-06-10 NOTE — Patient Instructions (Addendum)
Dermatology:  Sarina Ser, MD [phone]  Medication Instructions:  No changes  If you need a refill on your cardiac medications before your next appointment, please call your pharmacy.   Lab work: No new labs needed  Testing/Procedures: No new testing needed   Follow-Up:  . You will need a follow up appointment in 12 months  . Providers on your designated Care Team:   . Murray Hodgkins, NP . Christell Faith, PA-C . Marrianne Mood, PA-C   COVID-19 Vaccine Information can be found at: ShippingScam.co.uk For questions related to vaccine distribution or appointments, please email [email or web address] or call [phone].

## 2020-06-23 ENCOUNTER — Other Ambulatory Visit: Payer: Self-pay

## 2020-06-23 ENCOUNTER — Ambulatory Visit: Payer: Medicare HMO | Admitting: Dermatology

## 2020-06-23 DIAGNOSIS — L578 Other skin changes due to chronic exposure to nonionizing radiation: Secondary | ICD-10-CM | POA: Diagnosis not present

## 2020-06-23 DIAGNOSIS — L738 Other specified follicular disorders: Secondary | ICD-10-CM

## 2020-06-23 DIAGNOSIS — D489 Neoplasm of uncertain behavior, unspecified: Secondary | ICD-10-CM | POA: Diagnosis not present

## 2020-06-23 DIAGNOSIS — L720 Epidermal cyst: Secondary | ICD-10-CM

## 2020-06-23 NOTE — Patient Instructions (Addendum)
Cyst with symptoms and/or recent change.  Discussed surgical excision to remove, including resulting scar and possible recurrence.  Patient will schedule for surgery. Pre-op information given.  Recommend daily broad spectrum sunscreen SPF 30+ to sun-exposed areas, reapply every 2 hours as needed. Call for new or changing lesions.  Staying in the shade or wearing long sleeves, sun glasses (UVA+UVB protection) and wide brim hats (4-inch brim around the entire circumference of the hat) are also recommended for sun protection.   Recommend taking Heliocare sun protection supplement daily in sunny weather for additional sun protection. For maximum protection on the sunniest days, you can take up to 2 capsules of regular Heliocare OR take 1 capsule of Heliocare Ultra. For prolonged exposure (such as a full day in the sun), you can repeat your dose of the supplement 4 hours after your first dose. Heliocare can be purchased at [facility] or at [email or web address].   If you have any questions or concerns for your doctor, please call our main line at [phone] and press option 4 to reach your doctor's medical assistant. If no one answers, please leave a voicemail as directed and we will return your call as soon as possible. Messages left after 4 pm will be answered the following business day.   You may also send [place] a message via [place]. We typically respond to MyChart messages within 1-2 business days.  For prescription refills, please ask your pharmacy to contact our office. Our fax number is [phone].  If you have an urgent issue when the clinic is closed that cannot wait until the next business day, you can page your doctor at the number below.    Please note that while we do our best to be available for urgent issues outside of office hours, we are not available 24/7.   If you have an urgent issue and are unable to reach [place], you may choose to seek medical care at your doctor's office,  retail clinic, urgent care center, or emergency room.  If you have a medical emergency, please immediately call 911 or go to the emergency department.  Pager Numbers  - Dr. Nehemiah Massed: [phone]  - Dr. Laurence Ferrari: [phone]  - Dr. Nicole Kindred: [phone]  In the event of inclement weather, please call our main line at [phone] for an update on the status of any delays or closures.  Dermatology Medication Tips: Please keep the boxes that topical medications come in in order to help keep track of the instructions about where and how to use these. Pharmacies typically print the medication instructions only on the boxes and not directly on the medication tubes.   If your medication is too expensive, please contact our office at [phone] option 4 or send [place] a message through [place].   We are unable to tell what your co-pay for medications will be in advance as this is different depending on your insurance coverage. However, we may be able to find a substitute medication at lower cost or fill out paperwork to get insurance to cover a needed medication.   If a prior authorization is required to get your medication covered by your insurance company, please allow [place] 1-2 business days to complete this process.  Drug prices often vary depending on where the prescription is filled and some pharmacies may offer cheaper prices.  The website www.goodrx.com contains coupons for medications through different pharmacies. The prices here do not account for what the cost may be with help from insurance (it may  be cheaper with your insurance), but the website can give you the price if you did not use any insurance.  - You can print the associated coupon and take it with your prescription to the pharmacy.  - You may also stop by our office during regular business hours and pick up a GoodRx coupon card.  - If you need your prescription sent electronically to a different pharmacy, notify our office through  [facility] or by phone at [phone] option 4.

## 2020-06-23 NOTE — Progress Notes (Signed)
   New Patient Visit  Subjective  Benjamin Davidson is a 70 y.o. male who presents for the following: New Patient (Initial Visit) (Patient here concerning place on nose and back of neck. Patient states both spots have been there for some time. He denies they hurt or bother him. Patient denies personal or family history of skin cancer. Patient would also like to have right forearm looked at. He has a knot that he has had for a few years. ).   Objective  Well appearing patient in no apparent distress; mood and affect are within normal limits.  A focused examination was performed including nose, right forearm, left posterior neck. Relevant physical exam findings are noted in the Assessment and Plan.  Objective  Left Nasal Sidewall: 1.8 cm subcutaneous nodule   Objective  left posterior neck: 7 cm subcutaneous nodule.   Objective  right forearm: 7.0 cm rubbery subcutaneous nodule   Objective  Left Buccal Cheek: Smooth white papule  Assessment & Plan  Neoplasm of uncertain behavior (3) Left Nasal Sidewall  left posterior neck  right forearm  Favor epidermal inclusion cyst at left nasal sidewall   Cyst with symptoms and/or recent change.  Discussed surgical excision to remove, including resulting scar and possible recurrence.  Patient will schedule for surgery. Pre-op information given.  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 Bary Castilla  for general surgery due to size of lipoma at arm and cyst  at neck   Other Related Procedures Ambulatory referral to General Surgery  [place] Left Buccal Cheek  Bothersome. Will plan to remove at time of surgery for cyst at nose    Sebaceous Hyperplasia - Small yellow papules with a central dell  - Benign - Observe  Actinic  Damage - chronic, secondary to cumulative UV radiation exposure/sun exposure over time - diffuse scaly erythematous macules with underlying dyspigmentation - Recommend daily broad spectrum sunscreen SPF 30+ to sun-exposed areas, reapply every 2 hours as needed.  - Recommend staying in the shade or wearing long sleeves, sun glasses (UVA+UVB protection) and wide brim hats (4-inch brim around the entire circumference of the hat). - Call for new or changing lesions.  Return for surgery for cyst at nose .  I, Ruthell Rummage, CMA, am acting as scribe for Forest Gleason, MD.  Documentation: I have reviewed the above documentation for accuracy and completeness, and I agree with the above.  Forest Gleason, MD

## 2020-06-24 DIAGNOSIS — H35373 Puckering of macula, bilateral: Secondary | ICD-10-CM | POA: Diagnosis not present

## 2020-06-24 DIAGNOSIS — E113592 Type 2 diabetes mellitus with proliferative diabetic retinopathy without macular edema, left eye: Secondary | ICD-10-CM | POA: Diagnosis not present

## 2020-06-24 DIAGNOSIS — H3582 Retinal ischemia: Secondary | ICD-10-CM | POA: Diagnosis not present

## 2020-06-24 DIAGNOSIS — E113511 Type 2 diabetes mellitus with proliferative diabetic retinopathy with macular edema, right eye: Secondary | ICD-10-CM | POA: Diagnosis not present

## 2020-06-27 ENCOUNTER — Encounter: Payer: Self-pay | Admitting: Dermatology

## 2020-07-14 ENCOUNTER — Ambulatory Visit (INDEPENDENT_AMBULATORY_CARE_PROVIDER_SITE_OTHER): Payer: Medicare HMO | Admitting: Dermatology

## 2020-07-14 ENCOUNTER — Other Ambulatory Visit: Payer: Self-pay

## 2020-07-14 ENCOUNTER — Encounter: Payer: Self-pay | Admitting: Dermatology

## 2020-07-14 ENCOUNTER — Other Ambulatory Visit: Payer: Self-pay | Admitting: Dermatology

## 2020-07-14 DIAGNOSIS — L720 Epidermal cyst: Secondary | ICD-10-CM | POA: Diagnosis not present

## 2020-07-14 DIAGNOSIS — D485 Neoplasm of uncertain behavior of skin: Secondary | ICD-10-CM

## 2020-07-14 MED ORDER — CEPHALEXIN 500 MG PO CAPS: 500.0000 mg | ORAL_CAPSULE | Freq: Two times a day (BID) | ORAL | 0 refills | Status: AC

## 2020-07-14 MED ORDER — MUPIROCIN 2 % EX OINT: 1.0000 "application " | TOPICAL_OINTMENT | Freq: Every day | CUTANEOUS | 1 refills | Status: AC

## 2020-07-14 NOTE — Patient Instructions (Signed)
Wound Care Instructions  Cleanse wound gently with soap and water once a day then pat dry with clean gauze. Apply a thing coat of Petrolatum (petroleum jelly, "Vaseline") over the wound (unless you have an allergy to this). We recommend that you use a new, sterile tube of Vaseline. Do not pick or remove scabs. Do not remove the yellow or white "healing tissue" from the base of the wound.  Cover the wound with fresh, clean, nonstick gauze and secure with paper tape. You may use Band-Aids in place of gauze and tape if the would is small enough, but would recommend trimming much of the tape off as there is often too much. Sometimes Band-Aids can irritate the skin.  You should call the office for your biopsy report after 1 week if you have not already been contacted.  If you experience any problems, such as abnormal amounts of bleeding, swelling, significant bruising, significant pain, or evidence of infection, please call the office immediately.  FOR ADULT SURGERY PATIENTS: If you need something for pain relief you may take 1 extra strength Tylenol (acetaminophen) AND 2 Ibuprofen (200mg each) together every 4 hours as needed for pain. (do not take these if you are allergic to them or if you have a reason you should not take them.) Typically, you may only need pain medication for 1 to 3 days.   If you have any questions or concerns for your doctor, please call our main line at [phone] and press option 4 to reach your doctor's medical assistant. If no one answers, please leave a voicemail as directed and we will return your call as soon as possible. Messages left after 4 pm will be answered the following business day.   You may also send us a message via MyChart. We typically respond to MyChart messages within 1-2 business days.  For prescription refills, please ask your pharmacy to contact our office. Our fax number is [phone].  If you have an urgent issue when the clinic is closed that  cannot wait until the next business day, you can page your doctor at the number below.    Please note that while we do our best to be available for urgent issues outside of office hours, we are not available 24/7.   If you have an urgent issue and are unable to reach us, you may choose to seek medical care at your doctor's office, retail clinic, urgent care center, or emergency room.  If you have a medical emergency, please immediately call 911 or go to the emergency department.  Pager Numbers  - Dr. Kowalski: [phone]  - Dr. Moye: [phone]  - Dr. Stewart: [phone]  In the event of inclement weather, please call our main line at [phone] for an update on the status of any delays or closures.  Dermatology Medication Tips: Please keep the boxes that topical medications come in in order to help keep track of the instructions about where and how to use these. Pharmacies typically print the medication instructions only on the boxes and not directly on the medication tubes.   If your medication is too expensive, please contact our office at [phone] option 4 or send us a message through MyChart.   We are unable to tell what your co-pay for medications will be in advance as this is different depending on your insurance coverage. However, we may be able to find a substitute medication at lower cost or fill out paperwork to get insurance to cover a needed   medication.   If a prior authorization is required to get your medication covered by your insurance company, please allow us 1-2 business days to complete this process.  Drug prices often vary depending on where the prescription is filled and some pharmacies may offer cheaper prices.  The website www.goodrx.com contains coupons for medications through different pharmacies. The prices here do not account for what the cost may be with help from insurance (it may be cheaper with your insurance), but the website can give you the  price if you did not use any insurance.  - You can print the associated coupon and take it with your prescription to the pharmacy.  - You may also stop by our office during regular business hours and pick up a GoodRx coupon card.  - If you need your prescription sent electronically to a different pharmacy, notify our office through [facility] MyChart or by phone at [phone] option 4.   

## 2020-07-14 NOTE — Progress Notes (Signed)
   Follow-Up Visit   Subjective  Benjamin Davidson is a 70 y.o. male who presents for the following: Procedure (Patient here today for excision of cyst at nose. ).   The following portions of the chart were reviewed this encounter and updated as appropriate:   Tobacco  Allergies  Meds  Problems  Med Hx  Surg Hx  Fam Hx       Review of Systems:  No other skin or systemic complaints except as noted in HPI or Assessment and Plan.  Objective  Well appearing patient in no apparent distress; mood and affect are within normal limits.  A focused examination was performed including face. Relevant physical exam findings are noted in the Assessment and Plan.  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  Skin excision  Lesion length (cm):  1.7 Margin per side (cm):  0 Total excision diameter (cm):  1.7 Informed consent: discussed and consent obtained   Timeout: patient name, date of birth, surgical site, and procedure verified   Procedure prep:  Patient was prepped and draped in usual sterile fashion Prep type:  Povidone-iodine (Puracyn) Anesthesia: the lesion was anesthetized in a standard fashion   Anesthetic:  1% lidocaine w/ epinephrine 1-100,000 buffered w/ 8.4% NaHCO3 (3cc) Instrument used comment:  15c Hemostasis achieved with: suture, pressure and electrodesiccation   Outcome: patient tolerated procedure well with no complications   Post-procedure details: wound care instructions given   Additional details:  Mupirocin and a pressure dressing applied  Skin repair Complexity:  Intermediate Final length (cm):  1.5 Informed consent: discussed and consent obtained   Timeout: patient name, date of birth, surgical site, and procedure verified   Procedure prep:  Patient was prepped and draped in usual sterile fashion Prep type:  Povidone-iodine (Puracyn) Anesthesia:  the lesion was anesthetized in a standard fashion   Anesthetic:  1% lidocaine w/ epinephrine 1-100,000 local infiltration Reason for type of repair: reduce tension to allow closure, reduce the risk of dehiscence, infection, and necrosis, reduce subcutaneous dead space and avoid a hematoma, preserve normal anatomy and enhance both functionality and cosmetic results   Undermining: edges could be approximated without difficulty   Subcutaneous layers (deep stitches):  Suture size:  4-0 Suture type: Vicryl (polyglactin 910)   Stitches:  Buried vertical mattress Fine/surface layer approximation (top stitches):  Suture size:  5-0 Suture type: nylon   Hemostasis achieved with: suture and pressure Outcome: patient tolerated procedure well with no complications   Post-procedure details: wound care instructions given   Additional details:  Mupirocin and a pressure bandage applied   mupirocin ointment (BACTROBAN) 2 % Apply 1 application topically daily.  cephALEXin (KEFLEX) 500 MG capsule Take 1 capsule (500 mg total) by mouth 2 (two) times daily for 5 days.  Specimen 1 - Surgical pathology Differential Diagnosis: r/o Cyst vs Other  Check Margins: No 1.7cm subcutaneous nodule with overlying punctum  Symptomatic  Return in about 1 week (around 07/21/2020) for Suture Removal.  Graciella Belton, RMA, am acting as scribe for Forest Gleason, MD .  Documentation: I have reviewed the above documentation for accuracy and completeness, and I agree with the above.  Forest Gleason, MD

## 2020-07-15 ENCOUNTER — Telehealth: Payer: Self-pay

## 2020-07-15 DIAGNOSIS — E1142 Type 2 diabetes mellitus with diabetic polyneuropathy: Secondary | ICD-10-CM | POA: Diagnosis not present

## 2020-07-15 NOTE — Telephone Encounter (Signed)
Patient doing well following yesterday's procedure, JS

## 2020-07-20 ENCOUNTER — Telehealth: Payer: Self-pay

## 2020-07-20 NOTE — Telephone Encounter (Signed)
Patient notified of results. Denies further questions.

## 2020-07-20 NOTE — Telephone Encounter (Signed)
-----   Message from [place], MD sent at 07/20/2020  4:15 PM EDT ----- Skin , left nasofacial angle EPIDERMOID CYST, INFLAMED AND DISRUPTED "Normal benign cyst, no evidence of cancer" --> No additional treatment needed.  MAs please call. Thank you!

## 2020-07-21 ENCOUNTER — Other Ambulatory Visit: Payer: Self-pay

## 2020-07-21 ENCOUNTER — Encounter: Payer: Self-pay | Admitting: Dermatology

## 2020-07-21 ENCOUNTER — Ambulatory Visit (INDEPENDENT_AMBULATORY_CARE_PROVIDER_SITE_OTHER): Payer: Medicare HMO | Admitting: Dermatology

## 2020-07-21 DIAGNOSIS — Z4802 Encounter for removal of sutures: Secondary | ICD-10-CM

## 2020-07-21 NOTE — Patient Instructions (Addendum)
Appointment with Dr. Fleet Contras is 07/29/2020 11:15 AM  Recommend Murtis Sink moisturizing scar formula cream every night or [facility] brand or Mederma silicone scar sheet every night for the first year after a scar appears to help with scar remodeling if desired. Scars remodel on their own for a full year.   If you have any questions or concerns for your doctor, please call our main line at [phone] and press option 4 to reach your doctor's medical assistant. If no one answers, please leave a voicemail as directed and we will return your call as soon as possible. Messages left after 4 pm will be answered the following business day.   You may also send [place] a message via [place]. We typically respond to MyChart messages within 1-2 business days.  For prescription refills, please ask your pharmacy to contact our office. Our fax number is [phone].  If you have an urgent issue when the clinic is closed that cannot wait until the next business day, you can page your doctor at the number below.    Please note that while we do our best to be available for urgent issues outside of office hours, we are not available 24/7.   If you have an urgent issue and are unable to reach [place], you may choose to seek medical care at your doctor's office, retail clinic, urgent care center, or emergency room.  If you have a medical emergency, please immediately call 911 or go to the emergency department.  Pager Numbers  - Dr. Nehemiah Massed: [phone]  - Dr. Laurence Ferrari: [phone]  - Dr. Nicole Kindred: [phone]  In the event of inclement weather, please call our main line at [phone] for an update on the status of any delays or closures.  Dermatology Medication Tips: Please keep the boxes that topical medications come in in order to help keep track of the instructions about where and how to use these. Pharmacies typically print the medication instructions only on the boxes and not directly on the medication tubes.    If your medication is too expensive, please contact our office at [phone] option 4 or send [place] a message through [place].   We are unable to tell what your co-pay for medications will be in advance as this is different depending on your insurance coverage. However, we may be able to find a substitute medication at lower cost or fill out paperwork to get insurance to cover a needed medication.   If a prior authorization is required to get your medication covered by your insurance company, please allow [place] 1-2 business days to complete this process.  Drug prices often vary depending on where the prescription is filled and some pharmacies may offer cheaper prices.  The website www.goodrx.com contains coupons for medications through different pharmacies. The prices here do not account for what the cost may be with help from insurance (it may be cheaper with your insurance), but the website can give you the price if you did not use any insurance.  - You can print the associated coupon and take it with your prescription to the pharmacy.  - You may also stop by our office during regular business hours and pick up a GoodRx coupon card.  - If you need your prescription sent electronically to a different pharmacy, notify our office through [facility] or by phone at [phone] option 4.

## 2020-07-21 NOTE — Progress Notes (Signed)
   Follow-Up Visit   Subjective  Jovian Lembcke [place] is a 70 y.o. male who presents for the following: Follow-up (Patient here today for suture removal. ).   The following portions of the chart were reviewed this encounter and updated as appropriate:   Tobacco  Allergies  Meds  Problems  Med Hx  Surg Hx  Fam Hx       Review of Systems:  No other skin or systemic complaints except as noted in HPI or Assessment and Plan.  Objective  Well appearing patient in no apparent distress; mood and affect are within normal limits.  A focused examination was performed including face. Relevant physical exam findings are noted in the Assessment and Plan.   Assessment & Plan  Encounter for removal of sutures  Encounter for Removal of Sutures - Incision site at the left nasofacial angle is clean, dry and intact - Wound cleansed, sutures removed, wound cleansed and steri strips applied.  - Discussed pathology results showing benign cyst  - Scars remodel for a full year. - Once steri-strips fall off, patient can apply over-the-counter silicone scar cream each night to help with scar remodeling if desired. - Patient advised to call with any concerns or if they notice any new or changing lesions.  Return if symptoms worsen or fail to improve.  Graciella Belton, RMA, am acting as scribe for Forest Gleason, MD .  Documentation: I have reviewed the above documentation for accuracy and completeness, and I agree with the above.  Forest Gleason, MD

## 2020-07-27 ENCOUNTER — Other Ambulatory Visit: Payer: Self-pay | Admitting: Internal Medicine

## 2020-07-28 ENCOUNTER — Encounter: Payer: Medicare HMO | Admitting: Dermatology

## 2020-07-29 DIAGNOSIS — L723 Sebaceous cyst: Secondary | ICD-10-CM | POA: Diagnosis not present

## 2020-07-29 DIAGNOSIS — D1721 Benign lipomatous neoplasm of skin and subcutaneous tissue of right arm: Secondary | ICD-10-CM | POA: Diagnosis not present

## 2020-08-05 ENCOUNTER — Ambulatory Visit: Payer: Medicare HMO | Admitting: Internal Medicine

## 2020-08-06 ENCOUNTER — Other Ambulatory Visit: Payer: Self-pay | Admitting: Cardiovascular Disease

## 2020-08-16 DIAGNOSIS — Z951 Presence of aortocoronary bypass graft: Secondary | ICD-10-CM | POA: Diagnosis not present

## 2020-08-16 DIAGNOSIS — E11311 Type 2 diabetes mellitus with unspecified diabetic retinopathy with macular edema: Secondary | ICD-10-CM | POA: Diagnosis not present

## 2020-08-16 DIAGNOSIS — J45909 Unspecified asthma, uncomplicated: Secondary | ICD-10-CM | POA: Diagnosis not present

## 2020-08-16 DIAGNOSIS — Z794 Long term (current) use of insulin: Secondary | ICD-10-CM | POA: Diagnosis not present

## 2020-08-16 DIAGNOSIS — E119 Type 2 diabetes mellitus without complications: Secondary | ICD-10-CM | POA: Diagnosis not present

## 2020-08-16 DIAGNOSIS — Z7901 Long term (current) use of anticoagulants: Secondary | ICD-10-CM | POA: Diagnosis not present

## 2020-08-16 DIAGNOSIS — I10 Essential (primary) hypertension: Secondary | ICD-10-CM | POA: Diagnosis not present

## 2020-08-16 DIAGNOSIS — Z888 Allergy status to other drugs, medicaments and biological substances status: Secondary | ICD-10-CM | POA: Diagnosis not present

## 2020-08-16 DIAGNOSIS — I2510 Atherosclerotic heart disease of native coronary artery without angina pectoris: Secondary | ICD-10-CM | POA: Diagnosis not present

## 2020-08-16 DIAGNOSIS — H35371 Puckering of macula, right eye: Secondary | ICD-10-CM | POA: Diagnosis not present

## 2020-08-16 DIAGNOSIS — E113511 Type 2 diabetes mellitus with proliferative diabetic retinopathy with macular edema, right eye: Secondary | ICD-10-CM | POA: Diagnosis not present

## 2020-08-19 ENCOUNTER — Other Ambulatory Visit: Payer: Self-pay | Admitting: Internal Medicine

## 2020-08-25 DIAGNOSIS — E113511 Type 2 diabetes mellitus with proliferative diabetic retinopathy with macular edema, right eye: Secondary | ICD-10-CM | POA: Diagnosis not present

## 2020-08-25 DIAGNOSIS — H31093 Other chorioretinal scars, bilateral: Secondary | ICD-10-CM | POA: Diagnosis not present

## 2020-08-26 ENCOUNTER — Other Ambulatory Visit: Payer: Self-pay | Admitting: Cardiovascular Disease

## 2020-08-30 ENCOUNTER — Ambulatory Visit (INDEPENDENT_AMBULATORY_CARE_PROVIDER_SITE_OTHER): Payer: Medicare HMO | Admitting: Internal Medicine

## 2020-08-30 ENCOUNTER — Other Ambulatory Visit: Payer: Self-pay

## 2020-08-30 ENCOUNTER — Encounter: Payer: Self-pay | Admitting: Internal Medicine

## 2020-08-30 VITALS — BP 128/88 | HR 93 | Temp 97.6°F | Ht 70.0 in | Wt 262.0 lb

## 2020-08-30 DIAGNOSIS — E1142 Type 2 diabetes mellitus with diabetic polyneuropathy: Secondary | ICD-10-CM

## 2020-08-30 DIAGNOSIS — N1831 Chronic kidney disease, stage 3a: Secondary | ICD-10-CM | POA: Diagnosis not present

## 2020-08-30 DIAGNOSIS — I25119 Atherosclerotic heart disease of native coronary artery with unspecified angina pectoris: Secondary | ICD-10-CM | POA: Diagnosis not present

## 2020-08-30 DIAGNOSIS — F3340 Major depressive disorder, recurrent, in remission, unspecified: Secondary | ICD-10-CM

## 2020-08-30 DIAGNOSIS — R69 Illness, unspecified: Secondary | ICD-10-CM | POA: Diagnosis not present

## 2020-08-30 LAB — POCT GLYCOSYLATED HEMOGLOBIN (HGB A1C): Hemoglobin A1C: 6.3 % — AB (ref 4.0–5.6)

## 2020-08-30 MED ORDER — SEMAGLUTIDE(0.25 OR 0.5MG/DOS) 2 MG/1.5ML ~~LOC~~ SOPN: 0.2500 mg | PEN_INJECTOR | SUBCUTANEOUS | 5 refills | Status: DC

## 2020-08-30 NOTE — Assessment & Plan Note (Signed)
Stable DOE On carvedilol, losartan, ASA, statin

## 2020-08-30 NOTE — Patient Instructions (Signed)
Please start the semaglutide at 0.'25mg'$  weekly. If you have no significant problems after the first 4 weeks, increase to 0.5 mg weekly

## 2020-08-30 NOTE — Assessment & Plan Note (Signed)
GFR borderline low Is on low dose losartan

## 2020-08-30 NOTE — Assessment & Plan Note (Signed)
Mood has been okay Will continue the sertraline

## 2020-08-30 NOTE — Assessment & Plan Note (Signed)
He has tried some unproven OTC meds for this Discussed incretin Rx--he would like to try semaglutide

## 2020-08-30 NOTE — Assessment & Plan Note (Signed)
Lab Results  Component Value Date   HGBA1C 6.3 (A) 08/30/2020   Better on the same 35 bid of insulin No leg pain

## 2020-08-30 NOTE — Progress Notes (Signed)
Subjective:    Patient ID: Benjamin Davidson, male    DOB: [birth year], 70 y.o.   MRN: [id number]  HPI Here for follow up of diabetes and other chronic health conditions This visit occurred during the SARS-CoV-2 public health emergency.  Safety protocols were in place, including screening questions prior to the visit, additional usage of staff PPE, and extensive cleaning of exam room while observing appropriate contact time as indicated for disinfecting solutions.   Doing okay Sugars okay--- usually checks bid (before insulin 35 bid) AM near 100---evening some higher (rarely over 150 depending on how he eats) No foot pain or numbness  No chest pain No SOB----has stable DOE Tried some keto tablets--but SOB came on worse  Mood is okay Doesn't feel depressed  Some leg swelling--uses the furosemide daily No leg pain though  Last GFR 57  Current Outpatient Medications on File Prior to Visit  Medication Sig Dispense Refill   aspirin EC 81 MG tablet Take 1 tablet (81 mg total) by mouth daily. 150 tablet 2   carvedilol (COREG) 6.25 MG tablet TAKE 1 TABLET BY MOUTH TWICE A DAY 180 tablet 2   ferrous gluconate (FERGON) 324 MG tablet TAKE 1 TABLET BY MOUTH ONCE A DAY 30 tablet 0   furosemide (LASIX) 20 MG tablet TAKE 1 TABLET BY MOUTH ONCE A DAY. MAY TAKE ONE ADDITIONAL TABLET IF INCREASED SWELLING. 180 tablet 1   losartan (COZAAR) 25 MG tablet Take 1 tablet (25 mg total) by mouth daily. 90 tablet 3   mupirocin ointment (BACTROBAN) 2 % Apply 1 application topically daily. 22 g 1   NOVOLIN 70/30 (70-30) 100 UNIT/ML injection INJECT 35 UNITS SUBCUTANEOUSLY TWICE A DAY AS DIRECTED 20 mL 11   potassium chloride (KLOR-CON) 10 MEQ tablet TAKE 1 TABLET BY MOUTH ONCE A DAY 90 tablet 3   rosuvastatin (CRESTOR) 20 MG tablet TAKE 1 TABLET BY MOUTH ONCE A DAY 90 tablet 3   sertraline (ZOLOFT) 100 MG tablet TAKE 1 TABLET BY MOUTH ONCE DAILY 90 tablet 3   triamcinolone cream (KENALOG) 0.1 % Apply 1  application topically 2 (two) times daily as needed. 45 g 1   TRUEPLUS INSULIN SYRINGE 31G X 5/16" 0.5 ML MISC USE AS DIRECTED TWICE DAILY 100 each 12   XARELTO 10 MG TABS tablet TAKE 1 TABLET BY MOUTH ONCE A DAY 30 tablet 11   No current facility-administered medications on file prior to visit.    Allergies  Allergen Reactions   Losartan Other (See Comments)    Not an allergy -  Not an allergy -     Lisinopril     Cough Flu like symptoms     Past Medical History:  Diagnosis Date   Anemia    hx of   Arthritis    Asthma    childhood   Cataract    CHF (congestive heart failure) (HCC)    Coronary artery disease    DM (diabetes mellitus), type 2, uncontrolled, periph vascular complic (HCC)    Type II   Edema    feet/legs   Hx of colonic polyps    Hypertension    Kidney stones    Left leg DVT ([place])    2000s   MDD (recurrent major depressive disorder) in remission ([place])    Mitral regurgitation 08/10/2011   Peripheral vascular disease (HCC)    Left toe amputations. Dr Lucky Cowboy   Pulmonary embolism ([place]) 09/05/2005   Pure hypercholesterolemia    Shortness of  breath dyspnea    Syncope    resolved   Wears glasses     Past Surgical History:  Procedure Laterality Date   AMPUTATION TOE Left 03/17/2017   Procedure: FIRST RAY RESECTION LEFT FOOT;  Surgeon: Sharlotte Alamo, DPM;  Location: [facility];  Service: Podiatry;  Laterality: Left;   BIOPSY  06/18/2019   Procedure: BIOPSY;  Surgeon: Rush Landmark Telford Nab., MD;  Location: [place];  Service: Gastroenterology;;   CARDIAC CATHETERIZATION     CATARACT EXTRACTION W/PHACO Left 06/03/2015   Procedure: CATARACT EXTRACTION PHACO AND INTRAOCULAR LENS PLACEMENT ([place]);  Surgeon: Eulogio Bear, MD;  Location: [facility];  Service: Ophthalmology;  Laterality: Left;  Lot# [id number] H [place]: 1.06 AP%: 5.5 CDE: 3.69   CATARACT EXTRACTION W/PHACO Right 12/10/2017   Procedure: Aborted phacoemusification of the right eye.  No intraocular lens  was implated.;  Surgeon: Eulogio Bear, MD;  Location: [place];  Service: Ophthalmology;  Laterality: Right;  Diabetic - insulin   COLONOSCOPY     COLONOSCOPY WITH PROPOFOL N/A 06/18/2019   Procedure: COLONOSCOPY WITH PROPOFOL;  Surgeon: Rush Landmark Telford Nab., MD;  Location: [place];  Service: Gastroenterology;  Laterality: N/A;   CORONARY ARTERY BYPASS GRAFT  2008   ENDOSCOPIC MUCOSAL RESECTION N/A 06/18/2019   Procedure: ENDOSCOPIC MUCOSAL RESECTION;  Surgeon: Rush Landmark Telford Nab., MD;  Location: [place];  Service: Gastroenterology;  Laterality: N/A;   ESOPHAGOGASTRODUODENOSCOPY (EGD) WITH PROPOFOL N/A 06/18/2019   Procedure: ESOPHAGOGASTRODUODENOSCOPY (EGD) WITH PROPOFOL;  Surgeon: Rush Landmark Telford Nab., MD;  Location: [place];  Service: Gastroenterology;  Laterality: N/A;   EYE SURGERY     HEMOSTASIS CLIP PLACEMENT  06/18/2019   Procedure: HEMOSTASIS CLIP PLACEMENT;  Surgeon: Irving Copas., MD;  Location: [place];  Service: Gastroenterology;;   PARS PLANA VITRECTOMY Right 02/17/2015   Procedure: PARS PLANA VITRECTOMY WITH 25 GAUGE, endolaser, right eye;  Surgeon: Milus Height, MD;  Location: [facility];  Service: Ophthalmology;  Laterality: Right;  fluid pack lot # [id number] endolaser: 200w, 1439p   PERCUTANEOUS STENT INTERVENTION     right leg   PERIPHERAL VASCULAR BALLOON ANGIOPLASTY Left 03/20/2017   Procedure: PERIPHERAL VASCULAR BALLOON ANGIOPLASTY;  Surgeon: Katha Cabal, MD;  Location: [place] [facility];  Service: Cardiovascular;  Laterality: Left;   [place] CATHETER INSERTION  2013   POLYPECTOMY  06/18/2019   Procedure: POLYPECTOMY;  Surgeon: Mansouraty, Telford Nab., MD;  Location: [place];  Service: Gastroenterology;;   SUBMUCOSAL LIFTING INJECTION  06/18/2019   Procedure: SUBMUCOSAL LIFTING INJECTION;  Surgeon: Irving Copas., MD;  Location: [place];  Service: Gastroenterology;;    TOE AMPUTATION     left 5th, right 4th and 5th    Family History  Problem Relation Age of Onset   Heart disease Sister    Hyperlipidemia Brother    Colon cancer Neg Hx    Esophageal cancer Neg Hx    Inflammatory bowel disease Neg Hx    Liver disease Neg Hx    Pancreatic cancer Neg Hx    Rectal cancer Neg Hx    Stomach cancer Neg Hx     Social History   Socioeconomic History   Marital status: Divorced    Spouse name: Not on file   Number of children: 0   Years of education: Not on file   Highest education level: Not on file  Occupational History   Occupation: [name]    Comment: Disabled since 2000's  Tobacco Use   Smoking status:  Never   Smokeless tobacco: Never  Vaping Use   Vaping Use: Never used  Substance and Sexual Activity   Alcohol use: No    Alcohol/week: 0.0 standard drinks   Drug use: No   Sexual activity: Not on file  Other Topics Concern   Not on file  Social History Narrative   Has living will   Jeannetta Nap, sister, as health care POA.   Would accept resuscitation attempts   Not sure about tube feeds   Social Determinants of Health   Financial Resource Strain: Not on file  Food Insecurity: Not on file  Transportation Needs: Not on file  Physical Activity: Not on file  Stress: Not on file  Social Connections: Not on file  Intimate Partner Violence: Not on file    Review of Systems Weight stable Sleeps okay    Objective:   Physical Exam Constitutional:      Appearance: Normal appearance.  Cardiovascular:     Rate and Rhythm: Normal rate and regular rhythm.     Heart sounds: No murmur heard.   No gallop.     Comments: Faint pedal pulses Pulmonary:     Effort: Pulmonary effort is normal.     Breath sounds: Normal breath sounds. No wheezing or rales.  Musculoskeletal:     Cervical back: Neck supple.     Right lower leg: No edema.     Left lower leg: No edema.  Lymphadenopathy:     Cervical: No cervical adenopathy.  Skin:     Comments: No foot lesions  Neurological:     Mental Status: He is alert.  Psychiatric:        Mood and Affect: Mood normal.        Behavior: Behavior normal.           Assessment & Plan:

## 2020-09-07 ENCOUNTER — Other Ambulatory Visit: Payer: Self-pay | Admitting: Cardiovascular Disease

## 2020-09-15 DIAGNOSIS — E113511 Type 2 diabetes mellitus with proliferative diabetic retinopathy with macular edema, right eye: Secondary | ICD-10-CM | POA: Diagnosis not present

## 2020-09-15 DIAGNOSIS — E113592 Type 2 diabetes mellitus with proliferative diabetic retinopathy without macular edema, left eye: Secondary | ICD-10-CM | POA: Diagnosis not present

## 2020-10-14 DIAGNOSIS — E113511 Type 2 diabetes mellitus with proliferative diabetic retinopathy with macular edema, right eye: Secondary | ICD-10-CM | POA: Diagnosis not present

## 2020-10-14 DIAGNOSIS — H3582 Retinal ischemia: Secondary | ICD-10-CM | POA: Diagnosis not present

## 2020-10-14 DIAGNOSIS — H35033 Hypertensive retinopathy, bilateral: Secondary | ICD-10-CM | POA: Diagnosis not present

## 2020-10-14 DIAGNOSIS — E113592 Type 2 diabetes mellitus with proliferative diabetic retinopathy without macular edema, left eye: Secondary | ICD-10-CM | POA: Diagnosis not present

## 2020-10-22 DIAGNOSIS — E1142 Type 2 diabetes mellitus with diabetic polyneuropathy: Secondary | ICD-10-CM | POA: Diagnosis not present

## 2020-11-25 DIAGNOSIS — E113592 Type 2 diabetes mellitus with proliferative diabetic retinopathy without macular edema, left eye: Secondary | ICD-10-CM | POA: Diagnosis not present

## 2020-11-25 DIAGNOSIS — E113511 Type 2 diabetes mellitus with proliferative diabetic retinopathy with macular edema, right eye: Secondary | ICD-10-CM | POA: Diagnosis not present

## 2020-11-27 ENCOUNTER — Other Ambulatory Visit: Payer: Self-pay | Admitting: Cardiovascular Disease

## 2020-11-27 ENCOUNTER — Other Ambulatory Visit: Payer: Self-pay | Admitting: Internal Medicine

## 2020-12-27 DIAGNOSIS — E113592 Type 2 diabetes mellitus with proliferative diabetic retinopathy without macular edema, left eye: Secondary | ICD-10-CM | POA: Diagnosis not present

## 2020-12-27 DIAGNOSIS — E113511 Type 2 diabetes mellitus with proliferative diabetic retinopathy with macular edema, right eye: Secondary | ICD-10-CM | POA: Diagnosis not present

## 2021-01-21 DIAGNOSIS — E1142 Type 2 diabetes mellitus with diabetic polyneuropathy: Secondary | ICD-10-CM | POA: Diagnosis not present

## 2021-02-03 DIAGNOSIS — H18231 Secondary corneal edema, right eye: Secondary | ICD-10-CM | POA: Diagnosis not present

## 2021-02-03 DIAGNOSIS — H31093 Other chorioretinal scars, bilateral: Secondary | ICD-10-CM | POA: Diagnosis not present

## 2021-02-03 DIAGNOSIS — H35033 Hypertensive retinopathy, bilateral: Secondary | ICD-10-CM | POA: Diagnosis not present

## 2021-02-03 DIAGNOSIS — E113511 Type 2 diabetes mellitus with proliferative diabetic retinopathy with macular edema, right eye: Secondary | ICD-10-CM | POA: Diagnosis not present

## 2021-02-03 DIAGNOSIS — E113513 Type 2 diabetes mellitus with proliferative diabetic retinopathy with macular edema, bilateral: Secondary | ICD-10-CM | POA: Diagnosis not present

## 2021-03-02 ENCOUNTER — Encounter: Payer: Medicare HMO | Admitting: Internal Medicine

## 2021-03-10 DIAGNOSIS — E113593 Type 2 diabetes mellitus with proliferative diabetic retinopathy without macular edema, bilateral: Secondary | ICD-10-CM | POA: Diagnosis not present

## 2021-03-10 LAB — HM DIABETES EYE EXAM

## 2021-03-14 DIAGNOSIS — E113592 Type 2 diabetes mellitus with proliferative diabetic retinopathy without macular edema, left eye: Secondary | ICD-10-CM | POA: Diagnosis not present

## 2021-03-14 DIAGNOSIS — E113511 Type 2 diabetes mellitus with proliferative diabetic retinopathy with macular edema, right eye: Secondary | ICD-10-CM | POA: Diagnosis not present

## 2021-03-14 DIAGNOSIS — H3582 Retinal ischemia: Secondary | ICD-10-CM | POA: Diagnosis not present

## 2021-03-14 DIAGNOSIS — H31093 Other chorioretinal scars, bilateral: Secondary | ICD-10-CM | POA: Diagnosis not present

## 2021-03-26 DIAGNOSIS — I10 Essential (primary) hypertension: Secondary | ICD-10-CM | POA: Diagnosis not present

## 2021-03-26 DIAGNOSIS — E1151 Type 2 diabetes mellitus with diabetic peripheral angiopathy without gangrene: Secondary | ICD-10-CM | POA: Diagnosis not present

## 2021-03-26 DIAGNOSIS — R600 Localized edema: Secondary | ICD-10-CM | POA: Diagnosis not present

## 2021-03-26 DIAGNOSIS — E1140 Type 2 diabetes mellitus with diabetic neuropathy, unspecified: Secondary | ICD-10-CM | POA: Diagnosis not present

## 2021-03-26 DIAGNOSIS — R69 Illness, unspecified: Secondary | ICD-10-CM | POA: Diagnosis not present

## 2021-03-26 DIAGNOSIS — E785 Hyperlipidemia, unspecified: Secondary | ICD-10-CM | POA: Diagnosis not present

## 2021-03-26 DIAGNOSIS — Z794 Long term (current) use of insulin: Secondary | ICD-10-CM | POA: Diagnosis not present

## 2021-03-26 DIAGNOSIS — Z008 Encounter for other general examination: Secondary | ICD-10-CM | POA: Diagnosis not present

## 2021-03-26 DIAGNOSIS — E11620 Type 2 diabetes mellitus with diabetic dermatitis: Secondary | ICD-10-CM | POA: Diagnosis not present

## 2021-03-26 DIAGNOSIS — Z7901 Long term (current) use of anticoagulants: Secondary | ICD-10-CM | POA: Diagnosis not present

## 2021-03-26 DIAGNOSIS — Z7982 Long term (current) use of aspirin: Secondary | ICD-10-CM | POA: Diagnosis not present

## 2021-04-22 ENCOUNTER — Other Ambulatory Visit: Payer: Self-pay

## 2021-04-22 ENCOUNTER — Ambulatory Visit (INDEPENDENT_AMBULATORY_CARE_PROVIDER_SITE_OTHER): Payer: Medicare HMO | Admitting: Internal Medicine

## 2021-04-22 ENCOUNTER — Encounter: Payer: Self-pay | Admitting: Internal Medicine

## 2021-04-22 VITALS — BP 122/80 | HR 72 | Temp 98.1°F | Ht 69.0 in | Wt 265.0 lb

## 2021-04-22 DIAGNOSIS — E1142 Type 2 diabetes mellitus with diabetic polyneuropathy: Secondary | ICD-10-CM | POA: Diagnosis not present

## 2021-04-22 DIAGNOSIS — I25119 Atherosclerotic heart disease of native coronary artery with unspecified angina pectoris: Secondary | ICD-10-CM | POA: Diagnosis not present

## 2021-04-22 DIAGNOSIS — N1831 Chronic kidney disease, stage 3a: Secondary | ICD-10-CM | POA: Diagnosis not present

## 2021-04-22 DIAGNOSIS — Z0000 Encounter for general adult medical examination without abnormal findings: Secondary | ICD-10-CM | POA: Diagnosis not present

## 2021-04-22 DIAGNOSIS — S98131A Complete traumatic amputation of one right lesser toe, initial encounter: Secondary | ICD-10-CM | POA: Diagnosis not present

## 2021-04-22 DIAGNOSIS — S98132A Complete traumatic amputation of one left lesser toe, initial encounter: Secondary | ICD-10-CM

## 2021-04-22 DIAGNOSIS — I82409 Acute embolism and thrombosis of unspecified deep veins of unspecified lower extremity: Secondary | ICD-10-CM

## 2021-04-22 DIAGNOSIS — F3340 Major depressive disorder, recurrent, in remission, unspecified: Secondary | ICD-10-CM

## 2021-04-22 DIAGNOSIS — I739 Peripheral vascular disease, unspecified: Secondary | ICD-10-CM | POA: Diagnosis not present

## 2021-04-22 DIAGNOSIS — R69 Illness, unspecified: Secondary | ICD-10-CM | POA: Diagnosis not present

## 2021-04-22 LAB — RENAL FUNCTION PANEL
Albumin: 4.3 g/dL (ref 3.5–5.2)
BUN: 15 mg/dL (ref 6–23)
CO2: 32 mEq/L (ref 19–32)
Calcium: 9.2 mg/dL (ref 8.4–10.5)
Chloride: 102 mEq/L (ref 96–112)
Creatinine, Ser: 1.12 mg/dL (ref 0.40–1.50)
GFR: 67.12 mL/min (ref >60.00)
Glucose, Bld: 142 mg/dL — ABNORMAL HIGH (ref 70–99)
Phosphorus: 3.7 mg/dL (ref 2.3–4.6)
Potassium: 4.3 mEq/L (ref 3.5–5.1)
Sodium: 139 mEq/L (ref 135–145)

## 2021-04-22 LAB — HEPATIC FUNCTION PANEL
ALT: 28 U/L (ref 0–53)
AST: 22 U/L (ref 0–37)
Albumin: 4.3 g/dL (ref 3.5–5.2)
Alkaline Phosphatase: 57 U/L (ref 39–117)
Bilirubin, Direct: 0.2 mg/dL (ref 0.0–0.3)
Total Bilirubin: 0.6 mg/dL (ref 0.2–1.2)
Total Protein: 6.8 g/dL (ref 6.0–8.3)

## 2021-04-22 LAB — CBC
HCT: 39.7 % (ref 39.0–52.0)
Hemoglobin: 13.1 g/dL (ref 13.0–17.0)
MCHC: 33.1 g/dL (ref 30.0–36.0)
MCV: 82.1 fl (ref 78.0–100.0)
Platelets: 242 10*3/uL (ref 150.0–400.0)
RBC: 4.83 Mil/uL (ref 4.22–5.81)
RDW: 15.3 % (ref 11.5–15.5)
WBC: 7.1 10*3/uL (ref 4.0–10.5)

## 2021-04-22 LAB — HEMOGLOBIN A1C: Hgb A1c MFr Bld: 7 % — ABNORMAL HIGH (ref 4.6–6.5)

## 2021-04-22 LAB — LIPID PANEL
Cholesterol: 120 mg/dL (ref 0–200)
HDL: 35.3 mg/dL — ABNORMAL LOW (ref >39.00)
LDL Cholesterol: 71 mg/dL (ref 0–99)
NonHDL: 84.46
Total CHOL/HDL Ratio: 3
Triglycerides: 69 mg/dL (ref 0.0–149.0)
VLDL: 13.8 mg/dL (ref 0.0–40.0)

## 2021-04-22 LAB — MICROALBUMIN / CREATININE URINE RATIO
Creatinine,U: 31.3 mg/dL
Microalb Creat Ratio: 25.1 mg/g (ref 0.0–30.0)
Microalb, Ur: 7.8 mg/dL — ABNORMAL HIGH (ref 0.0–1.9)

## 2021-04-22 LAB — HM DIABETES FOOT EXAM

## 2021-04-22 NOTE — Assessment & Plan Note (Signed)
No recurrence on xarelto '10mg'$  ?

## 2021-04-22 NOTE — Progress Notes (Signed)
? ?Subjective:  ? ? Patient ID: Benjamin Davidson, male    DOB: 1950/07/07, 71 y.o.   MRN: [id number] ? ?HPI ?Here for Medicare wellness visit and follow up of chronic health conditions ?Reviewed advanced directives ?Reviewed other doctors---Dr King(ophthal), Dr Jackquline Bosch, Dr Matthew Folks, Dr Gollan--cardiology, Dr Gabriel Earing, Dr Lenox Ponds ?No hospitalizations or surgery in the past year ?No alcohol or tobacco ?Vision is fair--getting injections ?Hearing is fine ?Goes to gym but doesn't do much ?Did fall once ---did cut eyebrow (sister's friend stitched it for him) ?Chronic mood issues ?Independent with instrumental ADLs ?No sig memory issues ? ?Doing okay ?Frustrated by lack of weight loss ?Wasn't able to tolerate ozempic--he feels it made him eat more ?Sugars are variable---checks bid. AM typically under 110, afternoon in high 100's ? ?BMI still 39 ? ?Last GFR borderline at 58 ?Is on losartan ? ?Gets vague chest pain---not clearly angina ?Does go to [facility] gym--but not able to exercise long (easy DOE) ?No palpitations ?No edema--no clear claudication ?Does have occasional AM left leg cramp--better with straightening out ?No calf swelling ? ?Mood is okay ?No regular depression ?Some anhedonia---watches TV, doesn't go out much ? ?Current Outpatient Medications on File Prior to Visit  ?Medication Sig Dispense Refill  ? aspirin EC 81 MG tablet Take 1 tablet (81 mg total) by mouth daily. 150 tablet 2  ? carvedilol (COREG) 6.25 MG tablet TAKE 1 TABLET BY MOUTH TWICE A DAY 180 tablet 2  ? ferrous gluconate (FERGON) 324 MG tablet TAKE 1 TABLET BY MOUTH ONCE A DAY 30 tablet 0  ? furosemide (LASIX) 20 MG tablet TAKE 1 TABLET BY MOUTH ONCE A DAY. MAY TAKE ONE ADDITIONAL TABLET IF INCREASED SWELLING. 180 tablet 1  ? losartan (COZAAR) 25 MG tablet Take 1 tablet (25 mg total) by mouth daily. 90 tablet 3  ? mupirocin ointment (BACTROBAN) 2 % Apply 1 application topically daily. 22 g 1  ? NOVOLIN 70/30 (70-30) 100  UNIT/ML injection INJECT 35 UNITS SUBCUTANEOUSLY TWICE A DAY AS DIRECTED 20 mL 11  ? potassium chloride (KLOR-CON) 10 MEQ tablet TAKE 1 TABLET BY MOUTH ONCE A DAY 90 tablet 3  ? rosuvastatin (CRESTOR) 20 MG tablet TAKE 1 TABLET BY MOUTH ONCE A DAY 90 tablet 3  ? Semaglutide,0.25 or 0.'5MG'$ /DOS, 2 MG/1.5ML SOPN Inject 0.25 mg into the skin once a week. Increase to 0.'5mg'$  after the first month 1.5 mL 5  ? sertraline (ZOLOFT) 100 MG tablet TAKE 1 TABLET BY MOUTH ONCE DAILY 90 tablet 3  ? triamcinolone cream (KENALOG) 0.1 % Apply 1 application topically 2 (two) times daily as needed. 45 g 1  ? TRUEPLUS INSULIN SYRINGE 31G X 5/16" 0.5 ML MISC USE AS DIRECTED TWICE DAILY 100 each 12  ? XARELTO 10 MG TABS tablet TAKE 1 TABLET BY MOUTH ONCE A DAY 30 tablet 11  ? ?No current facility-administered medications on file prior to visit.  ? ? ?Allergies  ?Allergen Reactions  ? Losartan Other (See Comments)  ?  Not an allergy -  ?Not an allergy -  ?  ? Lisinopril   ?  Cough ?Flu like symptoms ?  ? ? ?Past Medical History:  ?Diagnosis Date  ? Anemia   ? hx of  ? Arthritis   ? Asthma   ? childhood  ? Cataract   ? CHF (congestive heart failure) ([place])   ? Coronary artery disease   ? DM (diabetes mellitus), type 2, uncontrolled, periph vascular complic   ? Type II  ?  Edema   ? feet/legs  ? Hx of colonic polyps   ? Hypertension   ? Kidney stones   ? Left leg DVT ([place])   ? 2000s  ? MDD (recurrent major depressive disorder) in remission [facility])   ? Mitral regurgitation 08/10/2011  ? Peripheral vascular disease ([place])   ? Left toe amputations. Dr Lucky Cowboy  ? Pulmonary embolism ([place]) 09/05/2005  ? Pure hypercholesterolemia   ? Shortness of breath dyspnea   ? Syncope   ? resolved  ? Wears glasses   ? ? ?Past Surgical History:  ?Procedure Laterality Date  ? AMPUTATION TOE Left 03/17/2017  ? Procedure: FIRST RAY RESECTION LEFT FOOT;  Surgeon: Sharlotte Alamo, DPM;  Location: [facility];  Service: Podiatry;  Laterality: Left;  ? BIOPSY  06/18/2019  ? Procedure:  BIOPSY;  Surgeon: Irving Copas., MD;  Location: [place];  Service: Gastroenterology;;  ? CARDIAC CATHETERIZATION    ? CATARACT EXTRACTION W/PHACO Left 06/03/2015  ? Procedure: CATARACT EXTRACTION PHACO AND INTRAOCULAR LENS PLACEMENT (IOC);  Surgeon: Eulogio Bear, MD;  Location: [facility];  Service: Ophthalmology;  Laterality: Left;  Lot# [id number] ?[place]: 1.06 ?AP%: 5.5 ?CDE: 3.69  ? CATARACT EXTRACTION W/PHACO Right 12/10/2017  ? Procedure: Aborted phacoemusification of the right eye.  No intraocular lens was implated.;  Surgeon: Eulogio Bear, MD;  Location: [place];  Service: Ophthalmology;  Laterality: Right;  Diabetic - insulin  ? COLONOSCOPY    ? COLONOSCOPY WITH PROPOFOL N/A 06/18/2019  ? Procedure: COLONOSCOPY WITH PROPOFOL;  Surgeon: Mansouraty, Telford Nab., MD;  Location: [place];  Service: Gastroenterology;  Laterality: N/A;  ? CORONARY ARTERY BYPASS GRAFT  2008  ? ENDOSCOPIC MUCOSAL RESECTION N/A 06/18/2019  ? Procedure: ENDOSCOPIC MUCOSAL RESECTION;  Surgeon: Rush Landmark Telford Nab., MD;  Location: [place];  Service: Gastroenterology;  Laterality: N/A;  ? ESOPHAGOGASTRODUODENOSCOPY (EGD) WITH PROPOFOL N/A 06/18/2019  ? Procedure: ESOPHAGOGASTRODUODENOSCOPY (EGD) WITH PROPOFOL;  Surgeon: Rush Landmark Telford Nab., MD;  Location: [place];  Service: Gastroenterology;  Laterality: N/A;  ? EYE SURGERY    ? HEMOSTASIS CLIP PLACEMENT  06/18/2019  ? Procedure: HEMOSTASIS CLIP PLACEMENT;  Surgeon: Irving Copas., MD;  Location: [place];  Service: Gastroenterology;;  ? PARS PLANA VITRECTOMY Right 02/17/2015  ? Procedure: PARS PLANA VITRECTOMY WITH 25 GAUGE, endolaser, right eye;  Surgeon: Milus Height, MD;  Location: [facility];  Service: Ophthalmology;  Laterality: Right;  fluid pack lot # [id number] ?endolaser: 200w, 1439p  ? PERCUTANEOUS STENT INTERVENTION    ? right leg  ? PERIPHERAL VASCULAR BALLOON ANGIOPLASTY Left 03/20/2017  ? Procedure: PERIPHERAL  VASCULAR BALLOON ANGIOPLASTY;  Surgeon: Katha Cabal, MD;  Location: [place] [facility];  Service: Cardiovascular;  Laterality: Left;  ? PERIPHERALLY INSERTED CENTRAL CATHETER INSERTION  2013  ? POLYPECTOMY  06/18/2019  ? Procedure: POLYPECTOMY;  Surgeon: Mansouraty, Telford Nab., MD;  Location: [place];  Service: Gastroenterology;;  ? [place] INJECTION  06/18/2019  ? Procedure: SUBMUCOSAL LIFTING INJECTION;  Surgeon: Irving Copas., MD;  Location: [place];  Service: Gastroenterology;;  ? TOE AMPUTATION    ? left 5th, right 4th and 5th  ? ? ?Family History  ?Problem Relation Age of Onset  ? Heart disease Sister   ? Hyperlipidemia Brother   ? Colon cancer Neg Hx   ? Esophageal cancer Neg Hx   ? Inflammatory bowel disease Neg Hx   ? Liver disease Neg Hx   ? Pancreatic cancer Neg Hx   ? Rectal cancer  Neg Hx   ? Stomach cancer Neg Hx   ? ? ?Social History  ? ?Socioeconomic History  ? Marital status: Divorced  ?  Spouse name: Not on file  ? Number of children: 0  ? Years of education: Not on file  ? Highest education level: Not on file  ?Occupational History  ? Occupation: [name]  ?  Comment: Disabled since 2000's  ?Tobacco Use  ? Smoking status: Never  ?  Passive exposure: Past  ? Smokeless tobacco: Never  ?Vaping Use  ? Vaping Use: Never used  ?Substance and Sexual Activity  ? Alcohol use: No  ?  Alcohol/week: 0.0 standard drinks  ? Drug use: No  ? Sexual activity: Not on file  ?Other Topics Concern  ? Not on file  ?Social History Narrative  ? Has living will  ? Jeannetta Nap, sister, as health care POA.  ? Would accept resuscitation attempts  ? Not sure about tube feeds  ? ?Social Determinants of Health  ? ?Financial Resource Strain: Not on file  ?Food Insecurity: Not on file  ?Transportation Needs: Not on file  ?Physical Activity: Not on file  ?Stress: Not on file  ?Social Connections: Not on file  ?Intimate Partner Violence: Not on file  ? ?Review of Systems ?Appetite is  okay ?Weight is stable ?Sleep is variable--will awaken 4-5 AM and can't get back to sleep. Initiates midnight or so ?Wears seat belt ?Teeth okay---keeps up with dentist ?No heartburn or dysphagia ?Bowels move fine--no

## 2021-04-22 NOTE — Assessment & Plan Note (Signed)
Lab Results  ?Component Value Date  ? HGBA1C 6.3 (A) 08/30/2020  ? ?Seems to still have good control on insulin 70/30 35 units twice a day ?No striking foot pain--no Rx ?

## 2021-04-22 NOTE — Assessment & Plan Note (Signed)
Mood is okay on the sertraline '100mg'$  ?No wean appropriate---some mild symptoms ?

## 2021-04-22 NOTE — Assessment & Plan Note (Signed)
Has vague non exertional chest pain--and stable DOE ?On carvedilol 6.25 bid, losartan 25 daily, rosuvastatin 20 ?

## 2021-04-22 NOTE — Assessment & Plan Note (Signed)
No ulcerations ?

## 2021-04-22 NOTE — Assessment & Plan Note (Signed)
Had amputations but no claudication now ?Is on statin ?

## 2021-04-22 NOTE — Assessment & Plan Note (Signed)
Borderline low ?Is on losartan '25mg'$  daily ?

## 2021-04-22 NOTE — Assessment & Plan Note (Signed)
BMI 39 with CAD, diabetes, etc ?He is going to retry the semaglutide 0.'25mg'$  weekly ?

## 2021-04-22 NOTE — Assessment & Plan Note (Signed)
I have personally reviewed the Medicare Annual Wellness questionnaire and have noted ?1. The patient's medical and social history ?2. Their use of alcohol, tobacco or illicit drugs ?3. Their current medications and supplements ?4. The patient's functional ability including ADL's, fall risks, home safety risks and hearing or visual ?            impairment. ?5. Diet and physical activities ?6. Evidence for depression or mood disorders ? ?The patients weight, height, BMI and visual acuity have been recorded in the chart ?I have made referrals, counseling and provided education to the patient based review of the above and I have provided the pt with a written personalized care plan for preventive services. ? ?I have provided you with a copy of your personalized plan for preventive services. Please take the time to review along with your updated medication list. ? ?Had colon 2021 but was due again ---will contact Dr Rush Landmark about this ?Discussed PSA---will recheck 1 last time next year ?Discussed exercise ?Flu vaccine in the fall---consider COVID booster as well ?Consider shingrix at the pharmacy ?

## 2021-04-22 NOTE — Progress Notes (Signed)
Hearing Screening - Comments:: Passed Whisper Test ?Vision Screening - Comments:: February 2023 ? ?

## 2021-04-25 ENCOUNTER — Telehealth: Payer: Self-pay

## 2021-04-25 DIAGNOSIS — E113511 Type 2 diabetes mellitus with proliferative diabetic retinopathy with macular edema, right eye: Secondary | ICD-10-CM | POA: Diagnosis not present

## 2021-04-25 DIAGNOSIS — E113592 Type 2 diabetes mellitus with proliferative diabetic retinopathy without macular edema, left eye: Secondary | ICD-10-CM | POA: Diagnosis not present

## 2021-04-25 NOTE — Telephone Encounter (Signed)
-----   Message from Irving Copas., MD sent at 04/22/2021  4:58 PM EDT ----- ?[place], ?Thank you for reaching out.  He is on our list but I am not sure why he did not get scheduled last year. ?We will get him in when he is ready this year. ? ?Shonteria Abeln, this patient can be scheduled for a colonoscopy with me in the [facility] for follow-up. ?May schedule next available, whenever the patient is ready. ?Please let Dr. Silvio Pate know when he is scheduled. ?Thanks. ?GM ? ?----- Message ----- ?From: Venia Carbon, MD ?Sent: 04/22/2021   1:13 PM EDT ?To: Irving Copas., MD ? ?He was due for colon again 5/22. He states he didn't get a recall. Can you look into this please? ? ?Rich ? ?

## 2021-04-25 NOTE — Telephone Encounter (Signed)
Left message on machine to call back  

## 2021-04-26 ENCOUNTER — Telehealth: Payer: Self-pay

## 2021-04-26 LAB — T4, FREE: Free T4: 0.83 ng/dL (ref 0.60–1.60)

## 2021-04-26 NOTE — Telephone Encounter (Signed)
The pt has been scheduled for colon and previsit.  Anti coag letter has been sent to PCP Dr Silvio Pate.  The pt has been advised of the appts and aware that the previsit will be a phone call.   ?

## 2021-04-26 NOTE — Telephone Encounter (Signed)
-----   Message from Venia Carbon, MD sent at 04/26/2021  1:26 PM EDT ----- ?Holding for 2 days should be adequate---but I would be okay with 3 days if that is preferred. ?----- Message ----- ?From: Timothy Lasso, RN ?Sent: 04/26/2021  10:01 AM EDT ?To: Venia Carbon, MD ? ? ?

## 2021-04-26 NOTE — Telephone Encounter (Signed)
Noted  

## 2021-05-02 DIAGNOSIS — E1142 Type 2 diabetes mellitus with diabetic polyneuropathy: Secondary | ICD-10-CM | POA: Diagnosis not present

## 2021-05-18 ENCOUNTER — Encounter: Payer: Self-pay | Admitting: Gastroenterology

## 2021-05-18 ENCOUNTER — Ambulatory Visit (AMBULATORY_SURGERY_CENTER): Payer: Medicare HMO

## 2021-05-18 VITALS — Ht 69.0 in | Wt 262.0 lb

## 2021-05-18 DIAGNOSIS — Z86010 Personal history of colonic polyps: Secondary | ICD-10-CM

## 2021-05-18 NOTE — Progress Notes (Signed)
No egg or soy allergy known to patient  ?No issues known to pt with past sedation with any surgeries or procedures ?Patient denies ever being told they had issues or difficulty with intubation  ?No FH of Malignant Hyperthermia ?Pt is not on diet pills ?Pt is not on home 02  ?Pt is on blood thinners-----Xarelto instructed to hold for 2 days per PCP - see TE notation  ?Pt denies issues with constipation  ?No A fib or A flutter ?NO PA's for preps discussed with pt in PV today  ?Discussed with pt there will be an out-of-pocket cost for prep and that varies from $0 to 70 + dollars - pt verbalized understanding  ?Pt instructed to use Singlecare.com or GoodRx for a price reduction on prep  ?PV completed over the phone. Pt verified name, DOB, address and insurance during PV today.  ?Pt mailed instruction packet with copy of consent form to read and not return, and instructions.  ?Pt encouraged to call with questions or issues.  ?If pt has My chart, procedure instructions sent via My Chart  ? ?Extended time spent with patient over medical information and prep instructions;  ?

## 2021-05-25 ENCOUNTER — Encounter: Payer: Self-pay | Admitting: Gastroenterology

## 2021-05-25 ENCOUNTER — Ambulatory Visit (AMBULATORY_SURGERY_CENTER): Payer: Medicare HMO | Admitting: Gastroenterology

## 2021-05-25 VITALS — BP 129/70 | HR 70 | Temp 97.1°F | Resp 10 | Ht 69.0 in | Wt 262.0 lb

## 2021-05-25 DIAGNOSIS — I509 Heart failure, unspecified: Secondary | ICD-10-CM | POA: Diagnosis not present

## 2021-05-25 DIAGNOSIS — Z86010 Personal history of colonic polyps: Secondary | ICD-10-CM | POA: Diagnosis not present

## 2021-05-25 DIAGNOSIS — D120 Benign neoplasm of cecum: Secondary | ICD-10-CM

## 2021-05-25 DIAGNOSIS — I2510 Atherosclerotic heart disease of native coronary artery without angina pectoris: Secondary | ICD-10-CM | POA: Diagnosis not present

## 2021-05-25 MED ORDER — SODIUM CHLORIDE 0.9 % IV SOLN: 500.0000 mL | INTRAVENOUS | Status: DC

## 2021-05-25 MED ORDER — FLEET ENEMA 7-19 GM/118ML RE ENEM
1.0000 | ENEMA | Freq: Once | RECTAL | Status: AC
  Administered 2021-05-25: 1 via RECTAL

## 2021-05-25 NOTE — Progress Notes (Signed)
? ?GASTROENTEROLOGY PROCEDURE H&P NOTE  ? ?Primary Care Physician: ?Venia Carbon, MD ? ?HPI: ?Benjamin Davidson is a 71 y.o. male who presents for Colonoscopy for surveillance of previous HF TA s/p resection in 2021 and late for follow up. ? ?Past Medical History:  ?Diagnosis Date  ? Anemia   ? hx of  ? Arthritis   ? Asthma   ? childhood  ? Cataract   ? CHF (congestive heart failure) ([place])   ? SHOB at times  ? Coronary artery disease   ? Diabetic peripheral neuropathy associated with type 2 diabetes mellitus ([place])   ? DM (diabetes mellitus), type 2, uncontrolled, periph vascular complic   ? Type II  ? Edema   ? feet/legs  ? Hx of colonic polyps   ? Hypertension   ? Kidney stones   ? Left leg DVT ([place])   ? 2000s  ? MDD (recurrent major depressive disorder) in remission [facility])   ? on meds  ? Mitral regurgitation 08/10/2011  ? Peripheral vascular disease ([place])   ? Left toe amputations. Dr Lucky Cowboy  ? Pulmonary embolism ([place]) 09/05/2005  ? Pure hypercholesterolemia   ? Shortness of breath dyspnea   ? Syncope   ? resolved  ? Wears glasses   ? ?Past Surgical History:  ?Procedure Laterality Date  ? AMPUTATION TOE Left 03/17/2017  ? Procedure: FIRST RAY RESECTION LEFT FOOT;  Surgeon: Sharlotte Alamo, DPM;  Location: [facility];  Service: Podiatry;  Laterality: Left;  ? BIOPSY  06/18/2019  ? Procedure: BIOPSY;  Surgeon: Irving Copas., MD;  Location: [place];  Service: Gastroenterology;;  ? CARDIAC CATHETERIZATION    ? CATARACT EXTRACTION W/PHACO Left 06/03/2015  ? Procedure: CATARACT EXTRACTION PHACO AND INTRAOCULAR LENS PLACEMENT (IOC);  Surgeon: Eulogio Bear, MD;  Location: [facility];  Service: Ophthalmology;  Laterality: Left;  Lot# [id number] ?[place]: 1.06 ?AP%: 5.5 ?CDE: 3.69  ? CATARACT EXTRACTION W/PHACO Right 12/10/2017  ? Procedure: Aborted phacoemusification of the right eye.  No intraocular lens was implated.;  Surgeon: Eulogio Bear, MD;  Location: [place];  Service: Ophthalmology;   Laterality: Right;  Diabetic - insulin  ? COLONOSCOPY WITH PROPOFOL N/A 06/18/2019  ? Procedure: COLONOSCOPY WITH PROPOFOL;  Surgeon: Mansouraty, Telford Nab., MD;  Location: [place];  Service: Gastroenterology;  Laterality: N/A;  ? CORONARY ARTERY BYPASS GRAFT  2008  ? ENDOSCOPIC MUCOSAL RESECTION N/A 06/18/2019  ? Procedure: ENDOSCOPIC MUCOSAL RESECTION;  Surgeon: Rush Landmark Telford Nab., MD;  Location: [place];  Service: Gastroenterology;  Laterality: N/A;  ? ESOPHAGOGASTRODUODENOSCOPY (EGD) WITH PROPOFOL N/A 06/18/2019  ? Procedure: ESOPHAGOGASTRODUODENOSCOPY (EGD) WITH PROPOFOL;  Surgeon: Rush Landmark Telford Nab., MD;  Location: [place];  Service: Gastroenterology;  Laterality: N/A;  ? EYE SURGERY    ? HEMOSTASIS CLIP PLACEMENT  06/18/2019  ? Procedure: HEMOSTASIS CLIP PLACEMENT;  Surgeon: Irving Copas., MD;  Location: [place];  Service: Gastroenterology;;  ? PARS PLANA VITRECTOMY Right 02/17/2015  ? Procedure: PARS PLANA VITRECTOMY WITH 25 GAUGE, endolaser, right eye;  Surgeon: Milus Height, MD;  Location: [facility];  Service: Ophthalmology;  Laterality: Right;  fluid pack lot # [id number] ?endolaser: 200w, 1439p  ? PERCUTANEOUS STENT INTERVENTION    ? right leg  ? PERIPHERAL VASCULAR BALLOON ANGIOPLASTY Left 03/20/2017  ? Procedure: PERIPHERAL VASCULAR BALLOON ANGIOPLASTY;  Surgeon: Katha Cabal, MD;  Location: [place] [facility];  Service: Cardiovascular;  Laterality: Left;  ? PERIPHERALLY INSERTED CENTRAL CATHETER INSERTION  2013  ? POLYPECTOMY  06/18/2019  ? Procedure: POLYPECTOMY;  Surgeon: Mansouraty, Telford Nab., MD;  Location: [place];  Service: Gastroenterology;;  ? [place] INJECTION  06/18/2019  ? Procedure: SUBMUCOSAL LIFTING INJECTION;  Surgeon: Irving Copas., MD;  Location: [place];  Service: Gastroenterology;;  ? TOE AMPUTATION    ? left 5th, right 4th and 5th  ? ?Current Outpatient Medications  ?Medication Sig Dispense Refill  ?  aspirin EC 81 MG tablet Take 1 tablet (81 mg total) by mouth daily. 150 tablet 2  ? carvedilol (COREG) 6.25 MG tablet TAKE 1 TABLET BY MOUTH TWICE A DAY 180 tablet 2  ? Cholecalciferol 25 MCG (1000 UT) tablet Take 1 tablet by mouth daily.    ? ferrous gluconate (FERGON) 324 MG tablet TAKE 1 TABLET BY MOUTH ONCE A DAY 30 tablet 0  ? furosemide (LASIX) 20 MG tablet TAKE 1 TABLET BY MOUTH ONCE A DAY. MAY TAKE ONE ADDITIONAL TABLET IF INCREASED SWELLING. 180 tablet 1  ? losartan (COZAAR) 25 MG tablet Take 1 tablet (25 mg total) by mouth daily. 90 tablet 3  ? mupirocin ointment (BACTROBAN) 2 % Apply 1 application topically daily. (Patient taking differently: Apply 1 application. topically daily as needed.) 22 g 1  ? NOVOLIN 70/30 (70-30) 100 UNIT/ML injection INJECT 35 UNITS SUBCUTANEOUSLY TWICE A DAY AS DIRECTED 20 mL 11  ? potassium chloride (KLOR-CON) 10 MEQ tablet TAKE 1 TABLET BY MOUTH ONCE A DAY 90 tablet 3  ? rosuvastatin (CRESTOR) 20 MG tablet TAKE 1 TABLET BY MOUTH ONCE A DAY 90 tablet 3  ? sertraline (ZOLOFT) 100 MG tablet TAKE 1 TABLET BY MOUTH ONCE DAILY 90 tablet 3  ? triamcinolone cream (KENALOG) 0.1 % Apply 1 application topically 2 (two) times daily as needed. 45 g 1  ? TRUEPLUS INSULIN SYRINGE 31G X 5/16" 0.5 ML MISC USE AS DIRECTED TWICE DAILY 100 each 12  ? XARELTO 10 MG TABS tablet TAKE 1 TABLET BY MOUTH ONCE A DAY 30 tablet 11  ? ?No current facility-administered medications for this visit.  ? ? ?Current Outpatient Medications:  ?  aspirin EC 81 MG tablet, Take 1 tablet (81 mg total) by mouth daily., Disp: 150 tablet, Rfl: 2 ?  carvedilol (COREG) 6.25 MG tablet, TAKE 1 TABLET BY MOUTH TWICE A DAY, Disp: 180 tablet, Rfl: 2 ?  Cholecalciferol 25 MCG (1000 UT) tablet, Take 1 tablet by mouth daily., Disp: , Rfl:  ?  ferrous gluconate (FERGON) 324 MG tablet, TAKE 1 TABLET BY MOUTH ONCE A DAY, Disp: 30 tablet, Rfl: 0 ?  furosemide (LASIX) 20 MG tablet, TAKE 1 TABLET BY MOUTH ONCE A DAY. MAY TAKE ONE  ADDITIONAL TABLET IF INCREASED SWELLING., Disp: 180 tablet, Rfl: 1 ?  losartan (COZAAR) 25 MG tablet, Take 1 tablet (25 mg total) by mouth daily., Disp: 90 tablet, Rfl: 3 ?  mupirocin ointment (BACTROBAN) 2 %, Apply 1 application topically daily. (Patient taking differently: Apply 1 application. topically daily as needed.), Disp: 22 g, Rfl: 1 ?  NOVOLIN 70/30 (70-30) 100 UNIT/ML injection, INJECT 35 UNITS SUBCUTANEOUSLY TWICE A DAY AS DIRECTED, Disp: 20 mL, Rfl: 11 ?  potassium chloride (KLOR-CON) 10 MEQ tablet, TAKE 1 TABLET BY MOUTH ONCE A DAY, Disp: 90 tablet, Rfl: 3 ?  rosuvastatin (CRESTOR) 20 MG tablet, TAKE 1 TABLET BY MOUTH ONCE A DAY, Disp: 90 tablet, Rfl: 3 ?  sertraline (ZOLOFT) 100 MG tablet, TAKE 1 TABLET BY MOUTH ONCE DAILY, Disp: 90 tablet, Rfl: 3 ?  triamcinolone  cream (KENALOG) 0.1 %, Apply 1 application topically 2 (two) times daily as needed., Disp: 45 g, Rfl: 1 ?  TRUEPLUS INSULIN SYRINGE 31G X 5/16" 0.5 ML MISC, USE AS DIRECTED TWICE DAILY, Disp: 100 each, Rfl: 12 ?  XARELTO 10 MG TABS tablet, TAKE 1 TABLET BY MOUTH ONCE A DAY, Disp: 30 tablet, Rfl: 11 ?Allergies  ?Allergen Reactions  ? Losartan Other (See Comments)  ?  Not an allergy -  ?Not an allergy -  ? ?Flu-like symptoms  ? Lisinopril   ?  Cough ?Flu like symptoms ?  ? ?Family History  ?Problem Relation Age of Onset  ? Heart disease Sister   ? Hyperlipidemia Brother   ? Colon cancer Neg Hx   ? Esophageal cancer Neg Hx   ? Inflammatory bowel disease Neg Hx   ? Liver disease Neg Hx   ? Pancreatic cancer Neg Hx   ? Rectal cancer Neg Hx   ? Stomach cancer Neg Hx   ? Colon polyps Neg Hx   ? ?Social History  ? ?Socioeconomic History  ? Marital status: Divorced  ?  Spouse name: Not on file  ? Number of children: 0  ? Years of education: Not on file  ? Highest education level: Not on file  ?Occupational History  ? Occupation: [name]  ?  Comment: Disabled since 2000's  ?Tobacco Use  ? Smoking status: Never  ?  Passive exposure: Past  ?  Smokeless tobacco: Never  ?Vaping Use  ? Vaping Use: Never used  ?Substance and Sexual Activity  ? Alcohol use: No  ?  Alcohol/week: 0.0 standard drinks  ? Drug use: No  ? Sexual activity: Not on file  ?Other Topics Conce

## 2021-05-25 NOTE — Progress Notes (Signed)
Vitals-DT ? ?Patient fell this am,and states that he is bruised on his r side. Patient is on xarelto. Patient needs two people to help him in the bed.  Dr. Rush Landmark was notified, and he stated that he would try to do the patient's procedure if he's cleaned out. ?Patient states "mushy stool." CRNA will speak to doctor.  Dr stated to reschedule the patient with a two day prep. ?

## 2021-05-25 NOTE — Progress Notes (Signed)
1533 Ephedrine 10 mg given IV due to low BP, MD updated.   ?

## 2021-05-25 NOTE — Progress Notes (Signed)
Pt's states no medical or surgical changes since previsit or office visit. 

## 2021-05-25 NOTE — Op Note (Signed)
[facility] ?Patient Name: Benjamin Davidson ?Procedure Date: 05/25/2021 3:04 PM ?MRN: [id number] ?Endoscopist: Justice Britain , MD ?Age: 71 ?Referring MD:  ?Date of Birth: 1950/02/11 ?Gender: Male ?Account #: [id number] ?Procedure:                Colonoscopy ?Indications:              Surveillance: History of numerous (> 10) adenomas  ?                          on last colonoscopy (< 3 yrs), Surveillance:  ?                          History of piecemeal removal adenoma on last  ?                          colonoscopy 1.5 years ago (missed follow up as  ?                          recommended previously for large HF polyp) ?Medicines:                Monitored Anesthesia Care ?Procedure:                Pre-Anesthesia Assessment: ?                          - Prior to the procedure, a History and Physical  ?                          was performed, and patient medications and  ?                          allergies were reviewed. The patient's tolerance of  ?                          previous anesthesia was also reviewed. The risks  ?                          and benefits of the procedure and the sedation  ?                          options and risks were discussed with the patient.  ?                          All questions were answered, and informed consent  ?                          was obtained. Prior Anticoagulants: The patient has  ?                          taken Xarelto (rivaroxaban), last dose was 2 days  ?                          prior to procedure. ASA Grade Assessment: III - A  ?  patient with severe systemic disease. After  ?                          reviewing the risks and benefits, the patient was  ?                          deemed in satisfactory condition to undergo the  ?                          procedure. ?                          After obtaining informed consent, the colonoscope  ?                          was passed under direct vision. Throughout the  ?                           procedure, the patient's blood pressure, pulse, and  ?                          oxygen saturations were monitored continuously. The  ?                          Olympus [id number] ([id number]) Colonoscope was  ?                          introduced through the anus and advanced to the 5  ?                          cm into the ileum. The colonoscopy was performed  ?                          without difficulty. The patient tolerated the  ?                          procedure. The quality of the bowel preparation was  ?                          adequate. The terminal ileum, ileocecal valve,  ?                          appendiceal orifice, and rectum were photographed. ?Scope In: 3:20:40 PM ?Scope Out: 3:38:45 PM ?Scope Withdrawal Time: 0 hours 14 minutes 22 seconds  ?Total Procedure Duration: 0 hours 18 minutes 5 seconds  ?Findings:                 The digital rectal exam findings include  ?                          hemorrhoids. Pertinent negatives include no  ?                          palpable rectal lesions. ?  A large amount of semi-liquid stool was found in  ?                          the entire colon, interfering with visualization.  ?                          Lavage of the area was performed using copious  ?                          amounts, resulting in clearance with adequate  ?                          visualization. ?                          The terminal ileum and ileocecal valve appeared  ?                          normal. ?                          Three sessile polyps were found in the cecum. The  ?                          polyps were 2 to 4 mm in size. These polyps were  ?                          removed with a cold snare. Resection and retrieval  ?                          were complete. ?                          A medium post mucosectomy scar was found at the  ?                          hepatic flexure. ?                          Normal mucosa was found in the entire  colon  ?                          otherwise. ?                          Non-bleeding non-thrombosed external and internal  ?                          hemorrhoids were found during retroflexion, during  ?                          perianal exam and during digital exam. The  ?                          hemorrhoids were Grade II (internal hemorrhoids  ?  that prolapse but reduce spontaneously). ?Complications:            No immediate complications. ?Estimated Blood Loss:     Estimated blood loss was minimal. ?Impression:               - Hemorrhoids found on digital rectal exam. ?                          - Stool in the entire examined colon. ?                          - The examined portion of the ileum was normal. ?                          - Three 2 to 4 mm polyps in the cecum, removed with  ?                          a cold snare. Resected and retrieved. ?                          - Post mucosectomy scar at the hepatic flexure. ?                          - Normal mucosa in the entire examined colon  ?                          otherwise. ?                          - Non-bleeding non-thrombosed external and internal  ?                          hemorrhoids. ?Recommendation:           - The patient will be observed post-procedure,  ?                          until all discharge criteria are met. ?                          - Discharge patient to home. ?                          - Patient has a contact number available for  ?                          emergencies. The signs and symptoms of potential  ?                          delayed complications were discussed with the  ?                          patient. Return to normal activities tomorrow.  ?                          Written discharge instructions were provided to the  ?  patient. ?                          - High fiber diet. ?                          - Use FiberCon 1-2 tablets PO daily. ?                          - May  restart Xarelto on 4/27. ?                          - Continue present medications. ?                          - Await pathology results. ?                          - Repeat colonoscopy in 3 years for surveillance  ?                          due to history of previous large advanced adenoma.  ?                          Use abdominal binder and give 1.5--2 day  ?                          preparation. ?                          - The findings and recommendations were discussed  ?                          with the patient. ?                          - The findings and recommendations were discussed  ?                          with the patient's family. ?Justice Britain, MD ?05/25/2021 3:45:01 PM ?

## 2021-05-25 NOTE — Progress Notes (Signed)
Called to room to assist during endoscopic procedure.  Patient ID and intended procedure confirmed with present staff. Received instructions for my participation in the procedure from the performing physician.  

## 2021-05-25 NOTE — Progress Notes (Signed)
Report given to PACU, vss 

## 2021-05-25 NOTE — Patient Instructions (Signed)
Handout on polyps and hemorrhoids given.   ? ?Restart Xarelto on 05/26/20 (tomorrow). ? ?YOU HAD AN ENDOSCOPIC PROCEDURE TODAY AT [facility]:   Refer to the procedure report that was given to you for any specific questions about what was found during the examination.  If the procedure report does not answer your questions, please call your gastroenterologist to clarify.  If you requested that your care partner not be given the details of your procedure findings, then the procedure report has been included in a sealed envelope for you to review at your convenience later. ? ?YOU SHOULD EXPECT: Some feelings of bloating in the abdomen. Passage of more gas than usual.  Walking can help get rid of the air that was put into your GI tract during the procedure and reduce the bloating. If you had a lower endoscopy (such as a colonoscopy or flexible sigmoidoscopy) you may notice spotting of blood in your stool or on the toilet paper. If you underwent a bowel prep for your procedure, you may not have a normal bowel movement for a few days. ? ?Please Note:  You might notice some irritation and congestion in your nose or some drainage.  This is from the oxygen used during your procedure.  There is no need for concern and it should clear up in a day or so. ? ?SYMPTOMS TO REPORT IMMEDIATELY: ? ?Following lower endoscopy (colonoscopy or flexible sigmoidoscopy): ? Excessive amounts of blood in the stool ? Significant tenderness or worsening of abdominal pains ? Swelling of the abdomen that is new, acute ? Fever of 100?F or higher ? ? ?For urgent or emergent issues, a gastroenterologist can be reached at any hour by calling [phone]. ?Do not use MyChart messaging for urgent concerns.  ? ? ?DIET:  We do recommend a small meal at first, but then you may proceed to your regular diet.  Drink plenty of fluids but you should avoid alcoholic beverages for 24 hours. ? ?ACTIVITY:  You should plan to take it easy for the  rest of today and you should NOT DRIVE or use heavy machinery until tomorrow (because of the sedation medicines used during the test).   ? ?FOLLOW UP: ?Our staff will call the number listed on your records 48-72 hours following your procedure to check on you and address any questions or concerns that you may have regarding the information given to you following your procedure. If we do not reach you, we will leave a message.  We will attempt to reach you two times.  During this call, we will ask if you have developed any symptoms of COVID 19. If you develop any symptoms (ie: fever, flu-like symptoms, shortness of breath, cough etc.) before then, please call [phone].  If you test positive for Covid 19 in the 2 weeks post procedure, please call and report this information to [place].   ? ?If any biopsies were taken you will be contacted by phone or by letter within the next 1-3 weeks.  Please call us at [phone] if you have not heard about the biopsies in 3 weeks.  ? ? ?SIGNATURES/CONFIDENTIALITY: ?You and/or your care partner have signed paperwork which will be entered into your electronic medical record.  These signatures attest to the fact that that the information above on your After Visit Summary has been reviewed and is understood.  Full responsibility of the confidentiality of this discharge information lies with you and/or your care-partner.  ?

## 2021-05-27 ENCOUNTER — Telehealth: Payer: Self-pay

## 2021-05-27 NOTE — Telephone Encounter (Signed)
?  Follow up Call- ? ? ?  05/25/2021  ?  2:18 PM 05/25/2021  ?  1:16 PM 03/11/2019  ?  8:11 AM  ?Call back number  ?Post procedure Call Back phone  # [phone]  [phone]  ?Permission to leave phone message Yes Yes Yes  ?  ? ?Patient questions: ? ?Do you have a fever, pain , or abdominal swelling? No. ?Pain Score  0 * ? ?Have you tolerated food without any problems? Yes.   ? ?Have you been able to return to your normal activities? Yes.   ? ?Do you have any questions about your discharge instructions: ?Diet   No. ?Medications  No. ?Follow up visit  No. ? ?Do you have questions or concerns about your Care? No. ? ?Actions: ?* If pain score is 4 or above: ?No action needed, pain <4. ? ? ?

## 2021-05-30 ENCOUNTER — Encounter: Payer: Self-pay | Admitting: Gastroenterology

## 2021-06-02 ENCOUNTER — Other Ambulatory Visit: Payer: Self-pay | Admitting: Cardiovascular Disease

## 2021-06-02 DIAGNOSIS — H31013 Macula scars of posterior pole (postinflammatory) (post-traumatic), bilateral: Secondary | ICD-10-CM | POA: Diagnosis not present

## 2021-06-02 DIAGNOSIS — E113592 Type 2 diabetes mellitus with proliferative diabetic retinopathy without macular edema, left eye: Secondary | ICD-10-CM | POA: Diagnosis not present

## 2021-06-02 DIAGNOSIS — H3582 Retinal ischemia: Secondary | ICD-10-CM | POA: Diagnosis not present

## 2021-06-02 DIAGNOSIS — E113511 Type 2 diabetes mellitus with proliferative diabetic retinopathy with macular edema, right eye: Secondary | ICD-10-CM | POA: Diagnosis not present

## 2021-06-02 NOTE — Telephone Encounter (Signed)
Please schedule F/U appointment for 90 day refills. Thank you! 

## 2021-06-03 ENCOUNTER — Other Ambulatory Visit: Payer: Self-pay | Admitting: Cardiovascular Disease

## 2021-06-14 ENCOUNTER — Other Ambulatory Visit: Payer: Self-pay | Admitting: Internal Medicine

## 2021-06-20 NOTE — Progress Notes (Unsigned)
Date:  06/21/2021   ID:  Benjamin Davidson, DOB 04-14-1950, MRN [id number]  Patient Location:  [address] [place] [address]   Provider location:   [facility], [place] office  PCP:  Benjamin Carbon, MD  Cardiologist:  Benjamin Davidson  Chief Complaint  Patient presents with   Follow-up    12 month follow up. Shortness of breath.  Medications reviewed with patient.     History of Present Illness:    Benjamin Davidson is a 71 y.o. 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,   diabetes,  A1C 7 sedentary lifestyle,   history of DVT in the left leg,  on xarelto who presents for routine followup of his coronary artery disease, peripheral arterial disease  Last seen by myself in the office May 2022 At that time weight was climbing,  up 20 pounds in 2 years 2019 to 2021 In follow-up today continues to be relatively sedentary, weight running high Chronic mild shortness of breath on exertion  Denies any chest pain on exertion Recent cardiac imaging available for review Trace leg swelling left greater than right, site of his remote DVT  Goes to [employer] and [employer]  to do his shopping  Denies any chest pain, anginal symptoms No recent admissions to the hospital  Labs reviewed HBA1C 7.0 Total chol 120, LDL 71  EKG personally reviewed by myself on todays visit NSR rate 71, LBBB, no changes  Other past medical history reviewed  hospital admission February 2019 Left great toe osteomyelitis with ulceration Amputation left great toe,  blister that never healed   Vascular surgery March 20, 2017 Angioplasty of left posterior tibial Angioplasty left peroneal artery  History of osteomyelitis requiring wound VAC on his right foot, 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 He had gangrene of the fifth toe on the right with acute renal failure and dehydration admitted  08/07/2011  No recent stress test No recent catheterization  last stress test July 2008 showing mild ischemia in the mid anteroseptal, apical anterior and apical septal region.   Ejection fraction 40%. This test was done post bypass. No cardiac catheterization was done in followup as he had no symptoms.  Past Medical History:  Diagnosis Date   Anemia    hx of   Arthritis    Asthma    childhood   Cataract    CHF (congestive heart failure) ([place])    SHOB at times   Coronary artery disease    Diabetic peripheral neuropathy associated with type 2 diabetes mellitus (HCC)    DM (diabetes mellitus), type 2, uncontrolled, periph vascular complic    Type II   Edema    feet/legs   Hx of colonic polyps    Hypertension    Kidney stones    Left leg DVT ([place])    2000s   MDD (recurrent major depressive disorder) in remission ([place])    on meds   Mitral regurgitation 08/10/2011   Peripheral vascular disease ([place])    Left toe amputations. Dr Lucky Cowboy   Pulmonary embolism ([place]) 09/05/2005   Pure hypercholesterolemia    Shortness of breath dyspnea    Syncope    resolved   Wears glasses    Past Surgical History:  Procedure Laterality Date   AMPUTATION TOE Left 03/17/2017   Procedure: FIRST RAY RESECTION LEFT FOOT;  Surgeon:  Sharlotte Alamo, DPM;  Location: [facility];  Service: Podiatry;  Laterality: Left;   BIOPSY  06/18/2019   Procedure: BIOPSY;  Surgeon: Rush Landmark Telford Nab., MD;  Location: [facility];  Service: Gastroenterology;;   CARDIAC CATHETERIZATION     CATARACT EXTRACTION W/PHACO Left 06/03/2015   Procedure: CATARACT EXTRACTION PHACO AND INTRAOCULAR LENS PLACEMENT ([place]);  Surgeon: Eulogio Bear, MD;  Location: [facility];  Service: Ophthalmology;  Laterality: Left;  Lot# [id number] H [place]: 1.06 AP%: 5.5 CDE: 3.69   CATARACT EXTRACTION W/PHACO Right 12/10/2017   Procedure: Aborted phacoemusification of the right eye.  No intraocular lens was implated.;  Surgeon: Eulogio Bear,  MD;  Location: [facility];  Service: Ophthalmology;  Laterality: Right;  Diabetic - insulin   COLONOSCOPY WITH PROPOFOL N/A 06/18/2019   Procedure: COLONOSCOPY WITH PROPOFOL;  Surgeon: Rush Landmark Telford Nab., MD;  Location: [facility];  Service: Gastroenterology;  Laterality: N/A;   CORONARY ARTERY BYPASS GRAFT  2008   ENDOSCOPIC MUCOSAL RESECTION N/A 06/18/2019   Procedure: ENDOSCOPIC MUCOSAL RESECTION;  Surgeon: Rush Landmark Telford Nab., MD;  Location: [facility];  Service: Gastroenterology;  Laterality: N/A;   ESOPHAGOGASTRODUODENOSCOPY (EGD) WITH PROPOFOL N/A 06/18/2019   Procedure: ESOPHAGOGASTRODUODENOSCOPY (EGD) WITH PROPOFOL;  Surgeon: Rush Landmark Telford Nab., MD;  Location: [facility];  Service: Gastroenterology;  Laterality: N/A;   EYE SURGERY     HEMOSTASIS CLIP PLACEMENT  06/18/2019   Procedure: HEMOSTASIS CLIP PLACEMENT;  Surgeon: Irving Copas., MD;  Location: [facility];  Service: Gastroenterology;;   PARS PLANA VITRECTOMY Right 02/17/2015   Procedure: PARS PLANA VITRECTOMY WITH 25 GAUGE, endolaser, right eye;  Surgeon: Milus Height, MD;  Location: [facility];  Service: Ophthalmology;  Laterality: Right;  fluid pack lot # [id number] endolaser: 200w, 1439p   PERCUTANEOUS STENT INTERVENTION     right leg   PERIPHERAL VASCULAR BALLOON ANGIOPLASTY Left 03/20/2017   Procedure: PERIPHERAL VASCULAR BALLOON ANGIOPLASTY;  Surgeon: Katha Cabal, MD;  Location: [facility];  Service: Cardiovascular;  Laterality: Left;   [place] CATHETER INSERTION  2013   POLYPECTOMY  06/18/2019   Procedure: POLYPECTOMY;  Surgeon: Mansouraty, Telford Nab., MD;  Location: [facility];  Service: Gastroenterology;;   SUBMUCOSAL LIFTING INJECTION  06/18/2019   Procedure: SUBMUCOSAL LIFTING INJECTION;  Surgeon: Irving Copas., MD;  Location: [facility];  Service: Gastroenterology;;   TOE AMPUTATION     left 5th, right 4th and 5th     Current Outpatient Medications on File Prior to Visit  Medication Sig Dispense Refill   aspirin EC 81 MG tablet Take 1 tablet (81 mg total) by mouth daily. 150 tablet 2   carvedilol (COREG) 6.25 MG tablet TAKE 1 TABLET BY MOUTH TWICE A DAY 180 tablet 0   Cholecalciferol 25 MCG (1000 UT) tablet Take 1 tablet by mouth daily.     ferrous gluconate (FERGON) 324 MG tablet TAKE 1 TABLET BY MOUTH ONCE A DAY 30 tablet 0   furosemide (LASIX) 20 MG tablet TAKE 1 TABLET BY MOUTH ONCE A DAY. MAY TAKE ONE ADDITIONAL TABLET IF INCREASED SWELLING. 180 tablet 0   losartan (COZAAR) 25 MG tablet TAKE 1 TABLET BY MOUTH ONCE A DAY 90 tablet 0   mupirocin ointment (BACTROBAN) 2 % Apply 1 application topically daily. 22 g 1   NOVOLIN 70/30 (70-30) 100 UNIT/ML injection INJECT 35 UNITS SUBCUTANEOUSLY TWICE A DAY AS DIRECTED 20 mL 11   potassium chloride (KLOR-CON) 10 MEQ tablet TAKE 1 TABLET BY MOUTH ONCE A DAY 90  tablet 3   rosuvastatin (CRESTOR) 20 MG tablet TAKE 1 TABLET BY MOUTH ONCE A DAY 90 tablet 3   sertraline (ZOLOFT) 100 MG tablet TAKE 1 TABLET BY MOUTH ONCE DAILY 90 tablet 3   triamcinolone cream (KENALOG) 0.1 % Apply 1 application topically 2 (two) times daily as needed. 45 g 1   TRUEPLUS INSULIN SYRINGE 31G X 5/16" 0.5 ML MISC USE AS DIRECTED TWICE DAILY 100 each 12   XARELTO 10 MG TABS tablet TAKE 1 TABLET BY MOUTH ONCE A DAY 30 tablet 11   No current facility-administered medications on file prior to visit.     Allergies:   Losartan and Lisinopril   Social History   Tobacco Use   Smoking status: Never    Passive exposure: Past   Smokeless tobacco: Never  Vaping Use   Vaping Use: Never used  Substance Use Topics   Alcohol use: No    Alcohol/week: 0.0 standard drinks   Drug use: No    Family Hx: The patient's family history includes Heart disease in his sister; Hyperlipidemia in his brother. There is no history of Colon cancer, Esophageal cancer, Inflammatory bowel disease, Liver  disease, Pancreatic cancer, Rectal cancer, Stomach cancer, or Colon polyps.  ROS:   Please see the history of present illness.    Review of Systems  Constitutional: Negative.   HENT: Negative.    Respiratory:  Positive for shortness of breath.   Cardiovascular: Negative.   Gastrointestinal: Negative.   Musculoskeletal: Negative.   Neurological: Negative.   Psychiatric/Behavioral: Negative.    All other systems reviewed and are negative.   Labs/Other Tests and Data Reviewed:    Recent Labs: 04/22/2021: ALT 28; BUN 15; Creatinine, Ser 1.12; Hemoglobin 13.1; Platelets 242.0; Potassium 4.3; Sodium 139   Recent Lipid Panel Lab Results  Component Value Date/Time   CHOL 120 04/22/2021 01:25 PM   TRIG 69.0 04/22/2021 01:25 PM   HDL 35.30 (L) 04/22/2021 01:25 PM   CHOLHDL 3 04/22/2021 01:25 PM   LDLCALC 71 04/22/2021 01:25 PM    Wt Readings from Last 3 Encounters:  06/21/21 264 lb 3.2 oz (119.8 kg)  05/25/21 262 lb (118.8 kg)  05/18/21 262 lb (118.8 kg)     Exam:    BP 140/72 (BP Location: Left Arm, Patient Position: Sitting, Cuff Size: Large)   Pulse 71   Ht '5\' 9"'$  (1.753 m)   Wt 264 lb 3.2 oz (119.8 kg)   SpO2 97%   BMI 39.02 kg/m   Constitutional:  oriented to person, place, and time. No distress.  HENT:  Head: Grossly normal Eyes:  no discharge. No scleral icterus.  Neck: No JVD, no carotid bruits  Cardiovascular: Regular rate and rhythm, no murmurs appreciated Trace swelling left lower extremity nonpitting Pulmonary/Chest: Clear to auscultation bilaterally, no wheezes or rales Abdominal: Soft.  no distension.  no tenderness.  Musculoskeletal: Normal range of motion Neurological:  normal muscle tone. Coordination normal. No atrophy Skin: Skin warm and dry Psychiatric: normal affect, pleasant  ASSESSMENT & PLAN:    PAD (peripheral artery disease) (HCC) Previously followed by vascular, dr. Ronalee Belts Last blood extremity ultrasound had mild diffuse disease He  denies any claudication symptoms, cholesterol at goal Stressed importance of weight loss, walking program  Morbid obesity ([place]) We have encouraged continued exercise, careful diet management in an effort to lose weight.  Type 2 diabetes, controlled, with peripheral neuropathy (HCC) A1c 7 range Recommended low carb diet  Pure hypercholesterolemia Cholesterol is at goal  on the current lipid regimen. No changes to the medications were made. LDL trending upwards  Essential hypertension Blood pressure is well controlled on today's visit. No changes made to the medications.  Atherosclerosis of native coronary artery of native heart with stable angina pectoris ([place]) Denies chest pain concerning for angina, no changes to medications Walking program recommended, echo as below.  Shortness of breath Reports longstanding issue, likely from deconditioning, obesity Echocardiogram ordered to rule out cardiac dysfunction Last was 2013 If symptoms get worse may need ischemic work-up   Total encounter time more than 30 minutes  Greater than 50% was spent in counseling and coordination of care with the patient   Signed, Ida Rogue, MD  06/21/2021 11:31 AM    [place] Office [address], [place], [place] [address]

## 2021-06-21 ENCOUNTER — Ambulatory Visit: Payer: Medicare HMO | Admitting: Cardiovascular Disease

## 2021-06-21 ENCOUNTER — Encounter: Payer: Self-pay | Admitting: Cardiovascular Disease

## 2021-06-21 VITALS — BP 140/72 | HR 71 | Ht 69.0 in | Wt 264.2 lb

## 2021-06-21 DIAGNOSIS — I10 Essential (primary) hypertension: Secondary | ICD-10-CM | POA: Diagnosis not present

## 2021-06-21 DIAGNOSIS — I25119 Atherosclerotic heart disease of native coronary artery with unspecified angina pectoris: Secondary | ICD-10-CM | POA: Diagnosis not present

## 2021-06-21 DIAGNOSIS — I739 Peripheral vascular disease, unspecified: Secondary | ICD-10-CM | POA: Diagnosis not present

## 2021-06-21 DIAGNOSIS — E7800 Pure hypercholesterolemia, unspecified: Secondary | ICD-10-CM

## 2021-06-21 DIAGNOSIS — E1142 Type 2 diabetes mellitus with diabetic polyneuropathy: Secondary | ICD-10-CM

## 2021-06-21 MED ORDER — CARVEDILOL 6.25 MG PO TABS: 6.2500 mg | ORAL_TABLET | Freq: Two times a day (BID) | ORAL | 3 refills | Status: DC

## 2021-06-21 MED ORDER — LOSARTAN POTASSIUM 25 MG PO TABS: 25.0000 mg | ORAL_TABLET | Freq: Every day | ORAL | 3 refills | Status: DC

## 2021-06-21 MED ORDER — FUROSEMIDE 20 MG PO TABS: ORAL_TABLET | ORAL | 3 refills | Status: DC

## 2021-06-21 NOTE — Patient Instructions (Addendum)
Medication Instructions:  No changes  If you need a refill on your cardiac medications before your next appointment, please call your pharmacy.   Lab work: No new labs needed  Testing/Procedures:  Your physician has requested that you have an echocardiogram. Echocardiography is a painless test that uses sound waves to create images of your heart. It provides your doctor with information about the size and shape of your heart and how well your heart's chambers and valves are working. This procedure takes approximately one hour. There are no restrictions for this procedure.   Follow-Up: At [facility], you and your health needs are our priority.  As part of our continuing mission to provide you with exceptional heart care, we have created designated Provider Care Teams.  These Care Teams include your primary Cardiologist (physician) and Advanced Practice Providers (APPs -  Physician Assistants and Nurse Practitioners) who all work together to provide you with the care you need, when you need it.  You will need a follow up appointment in 12 months  Providers on your designated Care Team:   Christopher Berge, NP Ryan Dunn, PA-C Cadence Furth, PA-C  COVID-19 Vaccine Information can be found at: [email or web address] For questions related to vaccine distribution or appointments, please email [email or web address] or call [phone].   

## 2021-06-30 DIAGNOSIS — E113511 Type 2 diabetes mellitus with proliferative diabetic retinopathy with macular edema, right eye: Secondary | ICD-10-CM | POA: Diagnosis not present

## 2021-06-30 DIAGNOSIS — E113592 Type 2 diabetes mellitus with proliferative diabetic retinopathy without macular edema, left eye: Secondary | ICD-10-CM | POA: Diagnosis not present

## 2021-07-19 ENCOUNTER — Ambulatory Visit (INDEPENDENT_AMBULATORY_CARE_PROVIDER_SITE_OTHER): Payer: Medicare HMO

## 2021-07-19 DIAGNOSIS — I25119 Atherosclerotic heart disease of native coronary artery with unspecified angina pectoris: Secondary | ICD-10-CM

## 2021-07-19 LAB — ECHOCARDIOGRAM COMPLETE
AR max vel: 1.74 cm2
AV Area VTI: 1.88 cm2
AV Area mean vel: 1.89 cm2
AV Mean grad: 4 mmHg
AV Peak grad: 7.8 mmHg
Ao pk vel: 1.4 m/s
Area-P 1/2: 3.76 cm2
Calc EF: 50.7 %
S' Lateral: 3.8 cm
Single Plane A2C EF: 43.1 %
Single Plane A4C EF: 56.7 %

## 2021-07-19 MED ORDER — PERFLUTREN LIPID MICROSPHERE
1.0000 mL | INTRAVENOUS | Status: AC | PRN
  Administered 2021-07-19: 2 mL via INTRAVENOUS

## 2021-07-22 ENCOUNTER — Telehealth: Payer: Self-pay | Admitting: Emergency Medicine

## 2021-07-22 DIAGNOSIS — I25119 Atherosclerotic heart disease of native coronary artery with unspecified angina pectoris: Secondary | ICD-10-CM

## 2021-07-22 NOTE — Telephone Encounter (Signed)
Attempted to schedule.  LMOV to call office.  ° °

## 2021-07-28 ENCOUNTER — Encounter
Admission: RE | Admit: 2021-07-28 | Discharge: 2021-07-28 | Disposition: A | Discharge status: Home / Self Care | Payer: Medicare HMO | Source: Ambulatory Visit | Attending: Cardiovascular Disease | Admitting: Cardiovascular Disease

## 2021-07-28 DIAGNOSIS — I25119 Atherosclerotic heart disease of native coronary artery with unspecified angina pectoris: Secondary | ICD-10-CM | POA: Diagnosis not present

## 2021-07-28 LAB — NM MYOCAR MULTI W/SPECT W/WALL MOTION / EF
LV dias vol: 216 mL (ref 62–150)
LV sys vol: 149 mL
Nuc Stress EF: 31 %
Peak HR: 78 {beats}/min
Percent HR: 51 %
Rest HR: 64 {beats}/min
Rest Nuclear Isotope Dose: 10.3 mCi
SDS: 4
SRS: 10
SSS: 11
ST Depression (mm): 0 mm
Stress Nuclear Isotope Dose: 31 mCi
TID: 1.02

## 2021-07-28 MED ORDER — REGADENOSON 0.4 MG/5ML IV SOLN
0.4000 mg | Freq: Once | INTRAVENOUS | Status: AC
  Administered 2021-07-28: 0.4 mg via INTRAVENOUS

## 2021-07-28 MED ORDER — TECHNETIUM TC 99M TETROFOSMIN IV KIT
30.9700 | PACK | Freq: Once | INTRAVENOUS | Status: AC | PRN
  Administered 2021-07-28: 30.97 via INTRAVENOUS

## 2021-07-28 MED ORDER — TECHNETIUM TC 99M TETROFOSMIN IV KIT
10.0000 | PACK | Freq: Once | INTRAVENOUS | Status: AC
  Administered 2021-07-28: 10.27 via INTRAVENOUS

## 2021-08-03 DIAGNOSIS — E1142 Type 2 diabetes mellitus with diabetic polyneuropathy: Secondary | ICD-10-CM | POA: Diagnosis not present

## 2021-08-04 ENCOUNTER — Other Ambulatory Visit: Payer: Self-pay | Admitting: Cardiovascular Disease

## 2021-08-04 ENCOUNTER — Encounter: Payer: Self-pay | Admitting: Medical

## 2021-08-04 ENCOUNTER — Ambulatory Visit: Payer: Medicare HMO | Admitting: Cardiology

## 2021-08-04 ENCOUNTER — Other Ambulatory Visit: Payer: Self-pay

## 2021-08-04 VITALS — BP 140/80 | HR 76 | Ht 69.0 in | Wt 254.0 lb

## 2021-08-04 DIAGNOSIS — I25119 Atherosclerotic heart disease of native coronary artery with unspecified angina pectoris: Secondary | ICD-10-CM

## 2021-08-04 DIAGNOSIS — N183 Chronic kidney disease, stage 3 unspecified: Secondary | ICD-10-CM | POA: Diagnosis not present

## 2021-08-04 DIAGNOSIS — E1122 Type 2 diabetes mellitus with diabetic chronic kidney disease: Secondary | ICD-10-CM | POA: Diagnosis not present

## 2021-08-04 DIAGNOSIS — E1142 Type 2 diabetes mellitus with diabetic polyneuropathy: Secondary | ICD-10-CM | POA: Diagnosis not present

## 2021-08-04 DIAGNOSIS — I5020 Unspecified systolic (congestive) heart failure: Secondary | ICD-10-CM

## 2021-08-04 DIAGNOSIS — I10 Essential (primary) hypertension: Secondary | ICD-10-CM

## 2021-08-04 DIAGNOSIS — E7800 Pure hypercholesterolemia, unspecified: Secondary | ICD-10-CM

## 2021-08-04 DIAGNOSIS — I739 Peripheral vascular disease, unspecified: Secondary | ICD-10-CM | POA: Diagnosis not present

## 2021-08-04 MED ORDER — ENTRESTO 24-26 MG PO TABS: 1.0000 | ORAL_TABLET | Freq: Two times a day (BID) | ORAL | 11 refills | Status: DC

## 2021-08-04 NOTE — Progress Notes (Signed)
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   Past Medical History    Past Medical History:  Diagnosis Date   Anemia    hx of   Arthritis    Asthma    childhood   Cataract    CHF (congestive heart failure) ([place])    SHOB at times   Coronary artery disease    Diabetic peripheral neuropathy associated with type 2 diabetes mellitus (HCC)    DM (diabetes mellitus), type 2, uncontrolled, periph vascular complic    Type II   Edema    feet/legs   Hx of colonic polyps    Hypertension    Kidney stones    Left leg DVT ([place])    2000s   MDD (recurrent major depressive disorder) in remission ([place])    on meds   Mitral regurgitation 08/10/2011   Peripheral vascular disease ([place])    Left toe amputations. Dr Lucky Cowboy   Pulmonary embolism ([place]) 09/05/2005   Pure hypercholesterolemia    Shortness of breath dyspnea    Syncope    resolved   Wears glasses    Past Surgical History:  Procedure Laterality Date   AMPUTATION TOE Left 03/17/2017   Procedure: FIRST RAY RESECTION LEFT FOOT;  Surgeon: Sharlotte Alamo, DPM;  Location: [facility];  Service: Podiatry;  Laterality: Left;   BIOPSY  06/18/2019   Procedure: BIOPSY;  Surgeon: Rush Landmark Telford Nab., MD;  Location: [place];  Service: Gastroenterology;;   CARDIAC CATHETERIZATION     CATARACT EXTRACTION W/PHACO Left 06/03/2015   Procedure: CATARACT EXTRACTION PHACO AND INTRAOCULAR LENS PLACEMENT ([place]);  Surgeon: Eulogio Bear, MD;  Location: [facility];  Service: Ophthalmology;  Laterality: Left;  Lot# [id number] H [place]:  1.06 AP%: 5.5 CDE: 3.69   CATARACT EXTRACTION W/PHACO Right 12/10/2017   Procedure: Aborted phacoemusification of the right eye.  No intraocular lens was implated.;  Surgeon: Eulogio Bear, MD;  Location: [place];  Service: Ophthalmology;  Laterality: Right;  Diabetic - insulin   COLONOSCOPY WITH PROPOFOL N/A 06/18/2019   Procedure: COLONOSCOPY WITH PROPOFOL;  Surgeon: Rush Landmark Telford Nab., MD;  Location: [place];  Service: Gastroenterology;  Laterality: N/A;   CORONARY ARTERY BYPASS GRAFT  2008   ENDOSCOPIC MUCOSAL RESECTION N/A 06/18/2019   Procedure: ENDOSCOPIC MUCOSAL RESECTION;  Surgeon: Rush Landmark Telford Nab., MD;  Location: [place];  Service: Gastroenterology;  Laterality: N/A;   ESOPHAGOGASTRODUODENOSCOPY (EGD) WITH PROPOFOL N/A 06/18/2019   Procedure: ESOPHAGOGASTRODUODENOSCOPY (EGD) WITH PROPOFOL;  Surgeon: Rush Landmark Telford Nab., MD;  Location: [place];  Service: Gastroenterology;  Laterality: N/A;   EYE SURGERY     HEMOSTASIS CLIP PLACEMENT  06/18/2019   Procedure: HEMOSTASIS CLIP PLACEMENT;  Surgeon: Irving Copas., MD;  Location[facility];  Service: Gastroenterology;;   PARS PLANA VITRECTOMY Right 02/17/2015   Procedure: PARS PLANA VITRECTOMY WITH 25 GAUGE, endolaser, right eye;  Surgeon: Milus Height, MD;  Location: [facility];  Service: Ophthalmology;  Laterality: Right;  fluid pack lot # [id number] H endolaser: 200w, 1439p   PERCUTANEOUS STENT INTERVENTION  right leg   PERIPHERAL VASCULAR BALLOON ANGIOPLASTY Left 03/20/2017   Procedure: PERIPHERAL VASCULAR BALLOON ANGIOPLASTY;  Surgeon: Katha Cabal, MD;  Location: [place] [facility];  Service: Cardiovascular;  Laterality: Left;   [place] CATHETER INSERTION  2013   POLYPECTOMY  06/18/2019   Procedure: POLYPECTOMY;  Surgeon: Irving Copas., MD;  Location: [place];  Service: Gastroenterology;;   SUBMUCOSAL LIFTING INJECTION   06/18/2019   Procedure: SUBMUCOSAL LIFTING INJECTION;  Surgeon: Irving Copas., MD;  Location: [place];  Service: Gastroenterology;;   TOE AMPUTATION     left 5th, right 4th and 5th    Allergies  Allergies  Allergen Reactions   Losartan Other (See Comments)    Not an allergy -  Not an allergy -   Flu-like symptoms   Lisinopril     Cough Flu like symptoms     History of Present Illness    71 year old male with a past medical history previously mentioned of coronary artery disease s/p CABG (03/2006), peripheral arterial disease without claudication, history of DVTs of left leg on chronic anticoagulant therapy with Xarelto, obesity, type 2 diabetes, chronic shortness of breath and dyspnea on exertion, and chronic fatigue.  History started in 2019 where he underwent successful coronary artery bypass grafting.  He has also undergone peripheral vascular balloon angioplasty on the left posterior tibial and peroneal artery in 03/20/2017.  He had also been hospitalized in February 2019 and ended up with left great toe osteomyelitis with ulceration with related to the amputation of his left great toe.  He also had gangrene of the fifth toe on his right foot which required amputation of the right fourth and fifth toe as well back in 2013.  He was last seen in the office on 06/21/2021 by Dr. Rockey Situ with complaints of shortness of breath.  Echocardiogram on 07/19/2021 which revealed EF 35 to 40%, left ventricle is moderately decreased function, left ventricle demonstrates regional wall motion abnormalities with hypokinesis of the anterior anteroseptal and apical region.  Grade 1 diastolic dysfunction.  He also underwent Lexiscan stress testing which revealed fixed LV perfusion defects in the mid to apical anterior and anteroseptal walls with some peri-infarct ischemia, findings consistent with prior myocardial infarction and mild peri-infarct ischemia, moderate to severely reduced LV systolic  function with an LVEF equal to 32%, coronary artery calcifications involving the LAD/RCA and left circumflex. He is back in clinic today with continued shortness of breath and peripheral edema that he stated he has had starting in 2005. He states that it has not improved or worsened over the last few months to year. He does continue to have chronic swelling to his bilateral lower extremities. He endorses shortness of breath today and swelling but denies any chest pain, chest tightness, or palpitations. He notes lately he is becoming more agitated than his normal. He stated that he has had some increased stress as his air conditioning just went out yesterday and he has been waiting on the repair guy that should be coming today. Denies any recent hospitalizations or visits to the emergency department.   Home Medications    Current Outpatient Medications  Medication Sig Dispense Refill   aspirin EC 81 MG tablet Take 1 tablet (81 mg total) by mouth daily. 150 tablet 2   carvedilol (COREG) 6.25 MG tablet Take 1 tablet (6.25 mg total) by mouth 2 (two) times daily. 180 tablet 3   Cholecalciferol 25 MCG (1000 UT) tablet Take 1  tablet by mouth daily.     ferrous gluconate (FERGON) 324 MG tablet TAKE 1 TABLET BY MOUTH ONCE A DAY 30 tablet 0   furosemide (LASIX) 20 MG tablet TAKE 1 TABLET BY MOUTH ONCE A DAY. MAY TAKE ONE ADDITIONAL TABLET IF INCREASED SWELLING. 180 tablet 3   mupirocin ointment (BACTROBAN) 2 % Apply 1 application topically daily. 22 g 1   NOVOLIN 70/30 (70-30) 100 UNIT/ML injection INJECT 35 UNITS SUBCUTANEOUSLY TWICE A DAY AS DIRECTED 20 mL 11   potassium chloride (KLOR-CON) 10 MEQ tablet TAKE 1 TABLET BY MOUTH ONCE A DAY 90 tablet 3   rosuvastatin (CRESTOR) 20 MG tablet TAKE 1 TABLET BY MOUTH ONCE A DAY 90 tablet 3   sacubitril-valsartan (ENTRESTO) 24-26 MG Take 1 tablet by mouth 2 (two) times daily. 60 tablet 11   sertraline (ZOLOFT) 100 MG tablet TAKE 1 TABLET BY MOUTH ONCE DAILY 90  tablet 3   triamcinolone cream (KENALOG) 0.1 % Apply 1 application topically 2 (two) times daily as needed. 45 g 1   TRUEPLUS INSULIN SYRINGE 31G X 5/16" 0.5 ML MISC USE AS DIRECTED TWICE DAILY 100 each 12   XARELTO 10 MG TABS tablet TAKE 1 TABLET BY MOUTH ONCE A DAY 30 tablet 11   No current facility-administered medications for this visit.     Family History    Family History  Problem Relation Age of Onset   Heart disease Sister    CAD Brother    Hyperlipidemia Brother    Colon cancer Neg Hx    Esophageal cancer Neg Hx    Inflammatory bowel disease Neg Hx    Liver disease Neg Hx    Pancreatic cancer Neg Hx    Rectal cancer Neg Hx    Stomach cancer Neg Hx    Colon polyps Neg Hx    He indicated that his mother is deceased. He indicated that his father is deceased. He indicated that two of his four sisters are alive. He indicated that two of his three brothers are alive. He indicated that the status of his neg hx is unknown.  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   Would accept resuscitation attempts   Not sure about tube feeds   Social Determinants of Health   Financial Resource Strain: Not on file  Food Insecurity: Not on file  Transportation Needs: Not on file  Physical Activity: Not on file  Stress: Not on file   Social Connections: Not on file  Intimate Partner Violence: Not on file     Review of Systems    General:  No chills, fever, night sweats or weight changes.  Cardiovascular:  No chest pain,  orthopnea, palpitations, paroxysmal nocturnal dyspnea, chronic dyspnea on exertion, edema, Dermatological: No rash, lesions/masses Respiratory: No cough, dyspnea Urologic: No hematuria, dysuria Abdominal:   No nausea, vomiting, diarrhea, bright red blood per rectum, melena, or hematemesis Neurologic:  No visual changes, wkns, changes in mental status. All other systems reviewed and are otherwise negative except as noted above.     Physical Exam    VS:  BP 140/80 (BP Location: Left Arm, Patient Position: Sitting, Cuff Size: Large)   Pulse 76   Ht '5\' 9"'$  (1.753 m)   Wt 254 lb (115.2 kg)   SpO2 97%   BMI 37.51 kg/m  , BMI Body mass index is 37.51 kg/m.     GEN: Well nourished, well developed, in no acute distress.  Ambulates with a cane HEENT: normal.  Glasses on. Neck: Supple, no JVD, carotid bruits, or masses. Cardiac: RRR, no murmurs, rubs, or gallops. No clubbing, cyanosis, 1+ edema noted to the bilateral lower extremities radials/DP/PT 2+ and equal bilaterally.  Respiratory:  Respirations regular and unlabored, clear to auscultation bilaterally.   GI: Soft, nontender, nondistended, BS + x 4. MS: no deformity or atrophy. Skin: warm and dry, no rash. Neuro:  Strength and sensation are intact. Psych: Normal affect.  Accessory Clinical Findings    ECG personally reviewed by me today-no new tracings  Lab Results  Component Value Date   WBC 7.1 04/22/2021   HGB 13.1 04/22/2021   HCT 39.7 04/22/2021   MCV 82.1 04/22/2021   PLT 242.0 04/22/2021   Lab Results  Component Value Date   CREATININE 1.12 04/22/2021   BUN 15 04/22/2021   NA 139 04/22/2021   K 4.3 04/22/2021   CL 102 04/22/2021   CO2 32 04/22/2021   Lab Results  Component Value Date   ALT 28 04/22/2021   AST 22  04/22/2021   ALKPHOS 57 04/22/2021   BILITOT 0.6 04/22/2021   Lab Results  Component Value Date   CHOL 120 04/22/2021   HDL 35.30 (L) 04/22/2021   LDLCALC 71 04/22/2021   TRIG 69.0 04/22/2021   CHOLHDL 3 04/22/2021    Lab Results  Component Value Date   HGBA1C 7.0 (H) 04/22/2021    Assessment & Plan   1.  HFrEF, LVEF 35 to 40% on echocardiogram completed 07/19/2021 with continued shortness of breath and peripheral edema.  Discussed with patient various causes for decrease in heart function as that is a drop from his previous study in 2013 with a EF of 55%.  There was some concern for further need for ischemic work-up to determine the cause and the drop of his EF.  After discussion with the patient it was decided to try goal-directed medical therapy which his losartan was discontinued and he was started on Entresto 24/26 mg twice daily.  He has allowed for adequate washout and was advised since he has not had his losartan today that he could start his first dose of Entresto this evening.  He was given a 30-day free coupon for this medication as well as a prescription was sent in to his pharmacy of choice.  He did inquire about the cost and was advised that it is rather expensive medication.  After starting the medication he will have a BMP prior to his return in 2 weeks.  GDMT can be escalated as tolerated.  He stated he would continue with medical management until his return appointment and will discuss ischemic work-up at that time as he wanted his sister  who is his POA to be involved in the discussion of more invasive testing.  We did discuss the procedure with his questions answered today.  He is continue his current dose of carvedilol 6.25 mg twice daily and furosemide 20 mg daily.  2.  Peripheral arterial disease which is previously been followed by Dr. Delana Meyer, last extremity ultrasound had mild diffuse disease.  He continues to deny any claudication symptoms.  He has been advised to  continue with his aspirin 81 mg daily.  Continue to and encourage increasing activity.  3.  Essential hypertension blood pressure today 140/80.  Previous losartan 25 mg has been discontinued and he has been started on Entresto 24 to 26 mg twice daily.  He has been encouraged to continue with his carvedilol and current Lasix dosing as well we will reevaluate blood pressure on return.  4.  Hypercholesterolemia with LDL of 71 on 04/22/2021.  LDL is trending upwards.  No changes made to medications today  5.  Coronary disease status post CABG currently denies any chest pain recently did undergo Lexiscan stress testing which revealed a fixed LV perfusion defect in the mid to apical anterior and anterior lateral walls with some peri-infarct ischemia, findings are consistent with prior myocardial infarction with mild peri-infarct ischemia, moderate to severely reduced LV systolic function LVEF equals 32%, and there is coronary artery calcifications involving the LAD, RCA, left circumflex.  Findings were discussed with the patient of undergone repeat heart catheterization with graft visualization versus medical management and at this time he wanted to try changes to his medications.  Patient states he will revisit after seeing how he feels with starting on new medications and discuss today's findings  with his sister Neoma Laming.  6.  Type 2 diabetes with peripheral neuropathy, last A1c 7.0 on 04/22/2021, continues to be followed by PCP  Disposition: Patient return to clinic in 2 weeks to see MD/APP as well as to have a BMP drawn prior to his return after starting Entresto  Minor Iden, NP 08/04/2021, 12:37 PM

## 2021-08-04 NOTE — Patient Instructions (Signed)
Medication Instructions:  Your physician has recommended you make the following change in your medication:  STOP LOSARTAN  START ENTRESTO 24-26 TONIGHT, 08/04/21 *If you need a refill on your cardiac medications before your next appointment, please call your pharmacy*   Lab Work: TO BE DONE IN 2 WEEKS: BNP If you have labs (blood work) drawn today and your tests are completely normal, you will receive your results only by: [place] (if you have MyChart) OR A paper copy in the mail If you have any lab test that is abnormal or we need to change your treatment, we will call you to review the results.   Testing/Procedures: NONE   Follow-Up: At [facility], you and your health needs are our priority.  As part of our continuing mission to provide you with exceptional heart care, we have created designated Provider Care Teams.  These Care Teams include your primary Cardiologist (physician) and Advanced Practice Providers (APPs -  Physician Assistants and Nurse Practitioners) who all work together to provide you with the care you need, when you need it.  We recommend signing up for the patient portal called "MyChart".  Sign up information is provided on this After Visit Summary.  MyChart is used to connect with patients for Virtual Visits (Telemedicine).  Patients are able to view lab/test results, encounter notes, upcoming appointments, etc.  Non-urgent messages can be sent to your provider as well.   To learn more about what you can do with MyChart, go to [email or web address].    Your next appointment:   2 week(s)  The format for your next appointment:   In Person  Provider:   You may see Ida Rogue, MD or one of the following Advanced Practice Providers on your designated Care Team:   Murray Hodgkins, NP Christell Faith, PA-C Cadence Kathlen Mody, PA-C{   Important Information About Sugar

## 2021-08-12 DIAGNOSIS — E113511 Type 2 diabetes mellitus with proliferative diabetic retinopathy with macular edema, right eye: Secondary | ICD-10-CM | POA: Diagnosis not present

## 2021-08-12 DIAGNOSIS — E113513 Type 2 diabetes mellitus with proliferative diabetic retinopathy with macular edema, bilateral: Secondary | ICD-10-CM | POA: Diagnosis not present

## 2021-08-22 ENCOUNTER — Encounter: Payer: Self-pay | Admitting: Medical

## 2021-08-22 ENCOUNTER — Ambulatory Visit: Payer: Medicare HMO | Admitting: Medical

## 2021-08-22 VITALS — BP 137/77 | HR 74 | Ht 69.5 in | Wt 260.0 lb

## 2021-08-22 DIAGNOSIS — Z79899 Other long term (current) drug therapy: Secondary | ICD-10-CM | POA: Diagnosis not present

## 2021-08-22 DIAGNOSIS — I25119 Atherosclerotic heart disease of native coronary artery with unspecified angina pectoris: Secondary | ICD-10-CM

## 2021-08-22 DIAGNOSIS — I5020 Unspecified systolic (congestive) heart failure: Secondary | ICD-10-CM

## 2021-08-22 DIAGNOSIS — I10 Essential (primary) hypertension: Secondary | ICD-10-CM

## 2021-08-22 DIAGNOSIS — N183 Chronic kidney disease, stage 3 unspecified: Secondary | ICD-10-CM

## 2021-08-22 DIAGNOSIS — E7800 Pure hypercholesterolemia, unspecified: Secondary | ICD-10-CM

## 2021-08-22 DIAGNOSIS — E1122 Type 2 diabetes mellitus with diabetic chronic kidney disease: Secondary | ICD-10-CM | POA: Diagnosis not present

## 2021-08-22 MED ORDER — EMPAGLIFLOZIN 10 MG PO TABS: 10.0000 mg | ORAL_TABLET | Freq: Every day | ORAL | 6 refills | Status: DC

## 2021-08-22 NOTE — Progress Notes (Unsigned)
Cardiology Office Note:    Date:  08/23/2021   ID:  Benjamin Davidson, DOB 24-Jul-1950, MRN [id number]  PCP:  Venia Carbon, MD  [facility] Cardiologist:  Ida Rogue, MD  [facility] Electrophysiologist:  None   Referring MD: Venia Carbon, MD   Chief Complaint: 2 week follow-up  History of Present Illness:    Benjamin Davidson is a 71 y.o. 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   History started in 2019 where he underwent successful coronary artery bypass grafting.  He has also undergone peripheral vascular balloon angioplasty on the left posterior tibial and peroneal artery in 03/20/2017.  He had also been hospitalized in February 2019 and ended up with left great toe osteomyelitis with ulceration with related to the amputation of his left great toe.  He also had gangrene of the fifth toe on his right foot which required amputation of the right fourth and fifth toe as well back in 2013.  He was last seen in the office on 06/21/2021 by Dr. Rockey Situ with complaints of shortness of breath.  Echocardiogram on 07/19/2021 which revealed EF 35 to 40%, left ventricle is moderately decreased function, left ventricle demonstrates regional wall motion abnormalities with hypokinesis of the anterior anteroseptal and apical region.  Grade 1 diastolic dysfunction.  He also underwent Lexiscan stress testing which revealed fixed LV perfusion defects in the mid to apical anterior and anteroseptal walls with some peri-infarct ischemia, findings consistent with prior myocardial infarction and mild peri-infarct ischemia, moderate to severely reduced LV systolic function with an LVEF equal to 32%, coronary artery calcifications involving the LAD/RCA and left  circumflex.  Echo 06/2021 for SOB showed reduced in EF 35-40%, HK of anteroseptal and anterior apical region, moderately dilated LV, mild LVH, G1DD, moderately dilated LA, trivial MR, Av sclerosis. Lexiscan showed EF<35%, fixed perfusion defect with peri-infarct ischemia, coronary artery calcifications LAD, RCA and LCx  Last seen 08/04/21 and reported persistent SOB and lower leg edema. GDMT vs heart cath was discussed, the patient decided to pursue medication management.  Today, the patient reports he is overall tired. Medications were discussed. Cardiac cath was discussed again, which the patient would again like to defer. He denies chest pain. He reports DOE, but this has been ongoing. He is also very sedentary at baseline.   Past Medical History:  Diagnosis Date   Anemia    hx of   Arthritis    Asthma    childhood   Cataract    CHF (congestive heart failure) ([place])    SHOB at times   Coronary artery disease    Diabetic peripheral neuropathy associated with type 2 diabetes mellitus (HCC)    DM (diabetes mellitus), type 2, uncontrolled, periph vascular complic    Type II   Edema    feet/legs   Hx of colonic polyps    Hypertension    Kidney stones    Left leg DVT ([place])    2000s   MDD (recurrent major depressive disorder) in remission ([place])    on meds   Mitral regurgitation 08/10/2011   Peripheral vascular disease ([place])    Left toe amputations. Dr Lucky Cowboy   Pulmonary embolism ([place]) 09/05/2005   Pure hypercholesterolemia    Shortness of breath  dyspnea    Syncope    resolved   Wears glasses     Past Surgical History:  Procedure Laterality Date   AMPUTATION TOE Left 03/17/2017   Procedure: FIRST RAY RESECTION LEFT FOOT;  Surgeon: Sharlotte Alamo, DPM;  Location: [facility];  Service: Podiatry;  Laterality: Left;   BIOPSY  06/18/2019   Procedure: BIOPSY;  Surgeon: Rush Landmark Telford Nab., MD;  Location: [place];  Service: Gastroenterology;;   CARDIAC CATHETERIZATION     CATARACT  EXTRACTION W/PHACO Left 06/03/2015   Procedure: CATARACT EXTRACTION PHACO AND INTRAOCULAR LENS PLACEMENT ([place]);  Surgeon: Eulogio Bear, MD;  Location: [facility];  Service: Ophthalmology;  Laterality: Left;  Lot# [id number] H [place]: 1.06 AP%: 5.5 CDE: 3.69   CATARACT EXTRACTION W/PHACO Right 12/10/2017   Procedure: Aborted phacoemusification of the right eye.  No intraocular lens was implated.;  Surgeon: Eulogio Bear, MD;  Location: [place];  Service: Ophthalmology;  Laterality: Right;  Diabetic - insulin   COLONOSCOPY WITH PROPOFOL N/A 06/18/2019   Procedure: COLONOSCOPY WITH PROPOFOL;  Surgeon: Rush Landmark Telford Nab., MD;  Location: [place];  Service: Gastroenterology;  Laterality: N/A;   CORONARY ARTERY BYPASS GRAFT  2008   ENDOSCOPIC MUCOSAL RESECTION N/A 06/18/2019   Procedure: ENDOSCOPIC MUCOSAL RESECTION;  Surgeon: Rush Landmark Telford Nab., MD;  Location: [place];  Service: Gastroenterology;  Laterality: N/A;   ESOPHAGOGASTRODUODENOSCOPY (EGD) WITH PROPOFOL N/A 06/18/2019   Procedure: ESOPHAGOGASTRODUODENOSCOPY (EGD) WITH PROPOFOL;  Surgeon: Rush Landmark Telford Nab., MD;  Location: [place];  Service: Gastroenterology;  Laterality: N/A;   EYE SURGERY     HEMOSTASIS CLIP PLACEMENT  06/18/2019   Procedure: HEMOSTASIS CLIP PLACEMENT;  Surgeon: Irving Copas., MD;  [facility];  Service: Gastroenterology;;   PARS PLANA VITRECTOMY Right 02/17/2015   Procedure: PARS PLANA VITRECTOMY WITH 25 GAUGE, endolaser, right eye;  Surgeon: Milus Height, MD;  Location: [facility];  Service: Ophthalmology;  Laterality: Right;  fluid pack lot # [id number] H endolaser: 200w, 1439p   PERCUTANEOUS STENT INTERVENTION     right leg   PERIPHERAL VASCULAR BALLOON ANGIOPLASTY Left 03/20/2017   Procedure: PERIPHERAL VASCULAR BALLOON ANGIOPLASTY;  Surgeon: Katha Cabal, MD;  Location: [place] CV LAB;  Service: Cardiovascular;  Laterality: Left;   [place] CATHETER INSERTION  2013   POLYPECTOMY  06/18/2019   Procedure: POLYPECTOMY;  Surgeon: Mansouraty, Telford Nab., MD;  Location: [place];  Service: Gastroenterology;;   SUBMUCOSAL LIFTING INJECTION  06/18/2019   Procedure: SUBMUCOSAL LIFTING INJECTION;  Surgeon: Irving Copas., MD;  Location: [place];  Service: Gastroenterology;;   TOE AMPUTATION     left 5th, right 4th and 5th    Current Medications: Current Meds  Medication Sig   aspirin EC 81 MG tablet Take 1 tablet (81 mg total) by mouth daily.   carvedilol (COREG) 6.25 MG tablet Take 1 tablet (6.25 mg total) by mouth 2 (two) times daily.   Cholecalciferol 25 MCG (1000 UT) tablet Take 1 tablet by mouth daily.   empagliflozin (JARDIANCE) 10 MG TABS tablet Take 1 tablet (10 mg total) by mouth daily before breakfast.   ferrous gluconate (FERGON) 324 MG tablet TAKE 1 TABLET BY MOUTH ONCE A DAY   furosemide (LASIX) 20 MG tablet TAKE 1 TABLET BY MOUTH ONCE A DAY. MAY TAKE ONE ADDITIONAL TABLET IF INCREASED SWELLING.   mupirocin ointment (BACTROBAN) 2 % Apply 1 application topically daily.   NOVOLIN 70/30 (70-30) 100 UNIT/ML injection INJECT 35 UNITS SUBCUTANEOUSLY TWICE A DAY  AS DIRECTED   potassium chloride (KLOR-CON) 10 MEQ tablet TAKE 1 TABLET BY MOUTH ONCE A DAY   rosuvastatin (CRESTOR) 20 MG tablet TAKE 1 TABLET BY MOUTH ONCE A DAY   sacubitril-valsartan (ENTRESTO) 24-26 MG Take 1 tablet by mouth 2 (two) times daily.   sertraline (ZOLOFT) 100 MG tablet TAKE 1 TABLET BY MOUTH ONCE DAILY   triamcinolone cream (KENALOG) 0.1 % Apply 1 application topically 2 (two) times daily as needed.   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   Would accept resuscitation attempts   Not sure about tube feeds   Social Determinants of Health   Financial Resource Strain: Not on file  Food Insecurity: Not on file  Transportation Needs: Not on file  Physical Activity: Not on file  Stress: Not on file  Social Connections: Not on file     Family History: The patient's family history includes CAD in his brother; Heart disease in his sister; Hyperlipidemia in his brother. There is no history of Colon cancer, Esophageal cancer, Inflammatory bowel disease, Liver disease, Pancreatic cancer, Rectal cancer, Stomach cancer, or Colon polyps.  ROS:   Please see the history of present illness.     All other systems reviewed and are negative.  EKGs/Labs/Other Studies Reviewed:    The following studies were reviewed today:  Echo 07/19/21  1. Left ventricular ejection fraction, by estimation, is 35 to 40%. The  left ventricle has moderately decreased function. The left ventricle  demonstrates regional wall motion abnormalities (hypokinesis of the  anterior/anteroseptal and apical region)  Moderately dilated LV. There is mild left ventricular hypertrophy. Left  ventricular diastolic parameters are consistent with Grade I diastolic  dysfunction (impaired relaxation).   2. Right ventricular systolic function is normal. The right ventricular  size is normal.   3. Left atrial size was moderately  dilated.   4. The mitral valve is normal in structure. Trivial mitral valve  regurgitation. No evidence of mitral stenosis.   5. The aortic valve was not well visualized. Aortic valve regurgitation  is not visualized. Aortic valve sclerosis is present, with no evidence of  aortic valve stenosis.   6. The inferior vena cava is normal in size with greater than 50%  respiratory variability, suggesting right atrial pressure of 3 mmHg.   Comparison(s): EF 55%.   Myoview lexiscan 07/28/21   Study is high risk due to EF <35%.   There is fixed LV perfusion defect in the mid  to apical anterior and anteroseptal walls, with some peri-infarct ischemia   Findings are consistent with prior myocardial infarction with mild peri-infarct ischemia.   There is moderate to severely reduced LV systolic function, LVEF = 32%   There is coronary artery calcifications involving the LAD, RCA LCx  EKG:  EKG is  ordered today.  The ekg ordered today demonstrates SB 54bpm, TWI III  Recent Labs: 04/22/2021: ALT 28; BUN 15; Creatinine, Ser 1.12; Hemoglobin 13.1; Platelets 242.0; Potassium 4.3; Sodium 139  Recent Lipid Panel    Component Value Date/Time   CHOL 120 04/22/2021 1325   TRIG 69.0 04/22/2021 1325   HDL 35.30 (L) 04/22/2021 1325   CHOLHDL 3 04/22/2021 1325   VLDL 13.8 04/22/2021 1325   LDLCALC 71 04/22/2021 1325     Physical Exam:    VS:  BP 137/77 (BP Location: Right Arm, Patient Position: Sitting, Cuff Size: Large)   Pulse 74   Ht 5' 9.5" (1.765 m)   Wt 260 lb (117.9 kg)   SpO2 97%   BMI 37.84 kg/m     Wt Readings from Last 3 Encounters:  08/22/21 260 lb (117.9 kg)  08/04/21 254 lb (115.2 kg)  06/21/21 264 lb 3.2 oz (119.8 kg)     GEN:  Well nourished, well developed in no acute distress HEENT: Normal NECK: No JVD; No carotid bruits LYMPHATICS: No lymphadenopathy CARDIAC: RR, bradcyardia, no murmurs, rubs, gallops RESPIRATORY:  Clear to auscultation without rales, wheezing or rhonchi   ABDOMEN: Soft, non-tender, non-distended MUSCULOSKELETAL:  No edema; No deformity  SKIN: Warm and dry NEUROLOGIC:  Alert and oriented x 3 PSYCHIATRIC:  Normal affect   ASSESSMENT:    1. HFrEF (heart failure with reduced ejection fraction) ([place])   2. CKD stage 3 due to type 2 diabetes mellitus ([place])   3. Essential hypertension   4. Atherosclerosis of native coronary artery of native heart with angina pectoris ([place])   5. Pure hypercholesterolemia   6. Medication management    PLAN:    In order of problems listed above:  HFrEF Echo 06/2021 showed LVEF 35-40%.  We dicussed cardiac cath again, but patient would like to defer at this time. The patient is euvolemic on exam. Continue lasix '20mg'$  daily, Coreg 6.'25mg'$ BID and Entresto 24/'26mg'$ BID. I will add on Jardiance '10mg'$  daily, BMET in 2 weeks. Continue GDMT with spironolactone at follow-up.   CAD s/p CABG Lexiscan stress test showed prior myocardial infarction with mild peri-infarct ischemia. No chest pain reported. Plan to defer heart cath, as above. Continue Aspirin, Coreg, and Crestor.   PAD Last extremity US showed mild diffuse disease. He denies claudication symptoms. Continue Aspirin and statin therapy.   HTN BP is reasonable today. Continue with current medications. Continue Coreg and Entresto.    Disposition: Follow up in 3 week(s) with MD/APP     Signed, Nyna Chilton Ninfa Meeker, PA-C  08/23/2021 12:13 PM    [facility]

## 2021-08-22 NOTE — Patient Instructions (Addendum)
Medication Instructions:  - Your physician has recommended you make the following change in your medication:   1) START Jardiance 10 mg: - take 1 tablet by mouth ONCE daily   Samples Given: Jardiance 10 mg Lot: [id number] Exp: January 2025 # 4 boxes  *If you need a refill on your cardiac medications before your next appointment, please call your pharmacy*   Lab Work: - Your physician recommends that you return for lab work in: 2 weeks (around 09/05/21)   [place] at [facility] 1st desk on the right to check in (REGISTRATION)  Lab hours: Monday- Friday (7:30 am- 5:30 pm)  If you have labs (blood work) drawn today and your tests are completely normal, you will receive your results only by: MyChart Message (if you have MyChart) OR A paper copy in the mail If you have any lab test that is abnormal or we need to change your treatment, we will call you to review the results.   Testing/Procedures: - none ordered   Follow-Up: At [facility], you and your health needs are our priority.  As part of our continuing mission to provide you with exceptional heart care, we have created designated Provider Care Teams.  These Care Teams include your primary Cardiologist (physician) and Advanced Practice Providers (APPs -  Physician Assistants and Nurse Practitioners) who all work together to provide you with the care you need, when you need it.  We recommend signing up for the patient portal called "MyChart".  Sign up information is provided on this After Visit Summary.  MyChart is used to connect with patients for Virtual Visits (Telemedicine).  Patients are able to view lab/test results, encounter notes, upcoming appointments, etc.  Non-urgent messages can be sent to your provider as well.   To learn more about what you can do with MyChart, go to [email or web address].    Your next appointment:   3-4 week(s)  The format for your next appointment:   In Person  Provider:    You may see Ida Rogue, MD or one of the following Advanced Practice Providers on your designated Care Team:    Cadence Kathlen Mody, [place]    Other Instructions  Empagliflozin Tablets What is this medication? EMPAGLIFLOZIN (EM pa gli FLOE zin) treats type 2 diabetes. It works by helping your kidneys remove sugar (glucose) from your blood through the urine, which decreases your blood sugar. It can also be used to lower the risk of heart attack, stroke, and hospitalization for heart failure in people with type 2 diabetes. Changes to diet and exercise are often combined with this medication. This medicine may be used for other purposes; ask your health care provider or pharmacist if you have questions. COMMON BRAND NAME(S): Jardiance What should I tell my care team before I take this medication? They need to know if you have any of these conditions: Dehydration Diabetic ketoacidosis Diet low in salt Eating less due to illness, surgery, dieting, or any other reason Frequently drink alcohol Having surgery High cholesterol High levels of potassium in the blood History of pancreatitis or pancreas problems History of yeast infection of the penis or vagina Infections in the bladder, kidneys, or urinary tract Kidney disease Liver disease Low blood pressure On hemodialysis Problems urinating Type 1 diabetes Uncircumcised male An unusual or allergic reaction to empagliflozin, other medications, foods, dyes, or preservatives Pregnant or trying to get pregnant Breast-feeding How should I use this medication? Take this medication by mouth with water.  Take it as directed on the prescription label at the same time every day. You may take it with or without food. Keep taking it unless your care team tells you to stop. A special MedGuide will be given to you by the pharmacist with each prescription and refill. Be sure to read this information carefully each time. Talk to your care team about the  use of this medication in children. Special care may be needed. Overdosage: If you think you have taken too much of this medicine contact a poison control center or emergency room at once. NOTE: This medicine is only for you. Do not share this medicine with others. What if I miss a dose? If you miss a dose, take it as soon as you can. If it is almost time for your next dose, take only that dose. Do not take double or extra doses. What may interact with this medication? Alcohol Diuretics Insulin Lithium This list may not describe all possible interactions. Give your health care provider a list of all the medicines, herbs, non-prescription drugs, or dietary supplements you use. Also tell them if you smoke, drink alcohol, or use illegal drugs. Some items may interact with your medicine. What should I watch for while using this medication? Visit your care team for regular checks on your progress. Tell your care team if your symptoms do not start to get better or if they get worse. This medication can cause a serious condition in which there is too much acid in the blood. If you develop nausea, vomiting, stomach pain, unusual tiredness, or breathing problems, stop taking this medication and call your care team right away. If possible, use a ketone dipstick to check for ketones in your urine. Check with your care team if you have severe diarrhea, nausea, and vomiting, or if you sweat a lot. The loss of too much body fluid may make it dangerous for you to take this medication. A test called the HbA1C (A1C) will be monitored. This is a simple blood test. It measures your blood sugar control over the last 2 to 3 months. You will receive this test every 3 to 6 months. Learn how to check your blood sugar. Learn the symptoms of low and high blood sugar and how to manage them. Always carry a quick-source of sugar with you in case you have symptoms of low blood sugar. Examples include hard sugar candy or glucose  tablets. Make sure others know that you can choke if you eat or drink when you develop serious symptoms of low blood sugar, such as seizures or unconsciousness. Get medical help at once. Tell your care team if you have high blood sugar. You might need to change the dose of your medication. If you are sick or exercising more than usual, you may need to change the dose of your medication. What side effects may I notice from receiving this medication? Side effects that you should report to your care team as soon as possible: Allergic reactions--skin rash, itching, hives, swelling of the face, lips, tongue, or throat Dehydration--increased thirst, dry mouth, feeling faint or lightheaded, headache, dark yellow or brown urine Diabetic ketoacidosis (DKA)--increased thirst or amount of urine, dry mouth, fatigue, fruity odor to breath, trouble breathing, stomach pain, nausea, vomiting Genital yeast infection--redness, swelling, pain, or itchiness, odor, thick or lumpy discharge New pain or tenderness, change in skin color, sores or ulcers, infection of the leg or foot Infection or redness, swelling, tenderness, or pain in the genitals,  or area from the genitals to the back of the rectum Urinary tract infection (UTI)--burning when passing urine, passing frequent small amounts of urine, bloody or cloudy urine, pain in the lower back or sides This list may not describe all possible side effects. Call your doctor for medical advice about side effects. You may report side effects to FDA at 1-800-FDA-1088. Where should I keep my medication? Keep out of the reach of children and pets. Store at room temperature between 20 and 25 degrees C (68 and 77 degrees F). Get rid of any unused medication after the expiration date. To get rid of medications that are no longer needed or have expired: Take the medication to a medication take-back program. Check with your pharmacy or law enforcement to find a location. If you  cannot return the medication, check the label or package insert to see if the medication should be thrown out in the garbage or flushed down the toilet. If you are not sure, ask your care team. If it is safe to put it in the trash, take the medication out of the container. Mix the medication with cat litter, dirt, coffee grounds, or other unwanted substance. Seal the mixture in a bag or container. Put it in the trash. NOTE: This sheet is a summary. It may not cover all possible information. If you have questions about this medicine, talk to your doctor, pharmacist, or health care provider.  2023 Elsevier/Gold Standard (2020-11-18 00:00:00)   Important Information About Sugar

## 2021-08-23 ENCOUNTER — Other Ambulatory Visit: Payer: Self-pay | Admitting: Internal Medicine

## 2021-08-30 DIAGNOSIS — E1142 Type 2 diabetes mellitus with diabetic polyneuropathy: Secondary | ICD-10-CM | POA: Diagnosis not present

## 2021-08-31 ENCOUNTER — Ambulatory Visit (INDEPENDENT_AMBULATORY_CARE_PROVIDER_SITE_OTHER): Payer: Medicare HMO | Admitting: Internal Medicine

## 2021-08-31 ENCOUNTER — Encounter: Payer: Self-pay | Admitting: Internal Medicine

## 2021-08-31 VITALS — BP 108/78 | HR 68 | Temp 97.3°F | Ht 69.0 in | Wt 257.0 lb

## 2021-08-31 DIAGNOSIS — E1142 Type 2 diabetes mellitus with diabetic polyneuropathy: Secondary | ICD-10-CM | POA: Diagnosis not present

## 2021-08-31 DIAGNOSIS — I25119 Atherosclerotic heart disease of native coronary artery with unspecified angina pectoris: Secondary | ICD-10-CM | POA: Diagnosis not present

## 2021-08-31 DIAGNOSIS — F3340 Major depressive disorder, recurrent, in remission, unspecified: Secondary | ICD-10-CM | POA: Diagnosis not present

## 2021-08-31 DIAGNOSIS — I5022 Chronic systolic (congestive) heart failure: Secondary | ICD-10-CM

## 2021-08-31 DIAGNOSIS — R69 Illness, unspecified: Secondary | ICD-10-CM | POA: Diagnosis not present

## 2021-08-31 LAB — POCT GLYCOSYLATED HEMOGLOBIN (HGB A1C): Hemoglobin A1C: 7.2 % — AB (ref 4.0–5.6)

## 2021-08-31 NOTE — Assessment & Plan Note (Signed)
Lab Results  Component Value Date   HGBA1C 7.2 (A) 08/31/2021   Good control still on 70/30 insulin 35 units bid Now just started jardiance so should improve

## 2021-08-31 NOTE — Progress Notes (Signed)
Subjective:    Patient ID: Benjamin Davidson, male    DOB: 07-18-1950, 71 y.o.   MRN: [id number]  HPI Here for follow up of diabetes and other chronic health conditions  Never did restart the ozempic Same insulin 35 bid Just started on jardiance '10mg'$  about a week ago Mild increase in voiding--no rash Has cut out diet drinks--mostly water and sugar free Kool-aid Checks sugars---mostly good (twice a day) AM usually under 120-130. Various times otherwise---supper time variable based on what he has eaten No foot sensation changes--no major pain No hypoglycemic reactions--but will feel weak if he goes out (like shopping) without eating  No chest pain Stable DOE---like when grocery shopping Slight edema No palpitations No dizziness or syncope  Current Outpatient Medications on File Prior to Visit  Medication Sig Dispense Refill   aspirin EC 81 MG tablet Take 1 tablet (81 mg total) by mouth daily. 150 tablet 2   carvedilol (COREG) 6.25 MG tablet Take 1 tablet (6.25 mg total) by mouth 2 (two) times daily. 180 tablet 3   Cholecalciferol 25 MCG (1000 UT) tablet Take 1 tablet by mouth daily.     empagliflozin (JARDIANCE) 10 MG TABS tablet Take 1 tablet (10 mg total) by mouth daily before breakfast. 30 tablet 6   ferrous gluconate (FERGON) 324 MG tablet TAKE 1 TABLET BY MOUTH ONCE A DAY 30 tablet 0   furosemide (LASIX) 20 MG tablet TAKE 1 TABLET BY MOUTH ONCE A DAY. MAY TAKE ONE ADDITIONAL TABLET IF INCREASED SWELLING. 180 tablet 3   mupirocin ointment (BACTROBAN) 2 % Apply 1 application topically daily. 22 g 1   NOVOLIN 70/30 (70-30) 100 UNIT/ML injection INJECT 35 UNITS SUBCUTANEOUSLY TWICE A DAY AS DIRECTED 20 mL 11   potassium chloride (KLOR-CON) 10 MEQ tablet TAKE 1 TABLET BY MOUTH ONCE A DAY 90 tablet 0   rosuvastatin (CRESTOR) 20 MG tablet TAKE 1 TABLET BY MOUTH ONCE A DAY 90 tablet 3   sacubitril-valsartan (ENTRESTO) 24-26 MG Take 1 tablet by mouth 2 (two) times daily. 60 tablet 11    sertraline (ZOLOFT) 100 MG tablet TAKE 1 TABLET BY MOUTH ONCE DAILY 90 tablet 3   triamcinolone cream (KENALOG) 0.1 % Apply 1 application topically 2 (two) times daily as needed. 45 g 1   TRUEPLUS INSULIN SYRINGE 31G X 5/16" 0.5 ML MISC USE AS DIRECTED TWICE DAILY 100 each 12   XARELTO 10 MG TABS tablet TAKE 1 TABLET BY MOUTH ONCE A DAY 30 tablet 11   No current facility-administered medications on file prior to visit.    Allergies  Allergen Reactions   Losartan Other (See Comments)    Not an allergy -  Not an allergy -   Flu-like symptoms   Lisinopril     Cough Flu like symptoms     Past Medical History:  Diagnosis Date   Anemia    hx of   Arthritis    Asthma    childhood   Cataract    CHF (congestive heart failure) ([place])    SHOB at times   Coronary artery disease    Diabetic peripheral neuropathy associated with type 2 diabetes mellitus ([place])    DM (diabetes mellitus), type 2, uncontrolled, periph vascular complic    Type II   Edema    feet/legs   Hx of colonic polyps    Hypertension    Kidney stones    Left leg DVT ([place])    2000s   MDD (recurrent major  depressive disorder) in remission ([place])    on meds   Mitral regurgitation 08/10/2011   Peripheral vascular disease ([facility])    Left toe amputations. Dr Lucky Cowboy   Pulmonary embolism ([place]) 09/05/2005   Pure hypercholesterolemia    Shortness of breath dyspnea    Syncope    resolved   Wears glasses     Past Surgical History:  Procedure Laterality Date   AMPUTATION TOE Left 03/17/2017   Procedure: FIRST RAY RESECTION LEFT FOOT;  Surgeon: Sharlotte Alamo, DPM;  Location: [facility];  Service: Podiatry;  Laterality: Left;   BIOPSY  06/18/2019   Procedure: BIOPSY;  Surgeon: Rush Landmark Telford Nab., MD;  Location: [place];  Service: Gastroenterology;;   CARDIAC CATHETERIZATION     CATARACT EXTRACTION W/PHACO Left 06/03/2015   Procedure: CATARACT EXTRACTION PHACO AND INTRAOCULAR LENS PLACEMENT ([place]);  Surgeon: Eulogio Bear, MD;  Location: [facility];  Service: Ophthalmology;  Laterality: Left;  Lot# [id number] H [place]: 1.06 AP%: 5.5 CDE: 3.69   CATARACT EXTRACTION W/PHACO Right 12/10/2017   Procedure: Aborted phacoemusification of the right eye.  No intraocular lens was implated.;  Surgeon: Eulogio Bear, MD;  Location: [place];  Service: Ophthalmology;  Laterality: Right;  Diabetic - insulin   COLONOSCOPY WITH PROPOFOL N/A 06/18/2019   Procedure: COLONOSCOPY WITH PROPOFOL;  Surgeon: Rush Landmark Telford Nab., MD;  Location: [place];  Service: Gastroenterology;  Laterality: N/A;   CORONARY ARTERY BYPASS GRAFT  2008   ENDOSCOPIC MUCOSAL RESECTION N/A 06/18/2019   Procedure: ENDOSCOPIC MUCOSAL RESECTION;  Surgeon: Rush Landmark Telford Nab., MD;  Location: [place];  Service: Gastroenterology;  Laterality: N/A;   ESOPHAGOGASTRODUODENOSCOPY (EGD) WITH PROPOFOL N/A 06/18/2019   Procedure: ESOPHAGOGASTRODUODENOSCOPY (EGD) WITH PROPOFOL;  Surgeon: Rush Landmark Telford Nab., MD;  Location: [place];  Service: Gastroenterology;  Laterality: N/A;   EYE SURGERY     HEMOSTASIS CLIP PLACEMENT  06/18/2019   Procedure: HEMOSTASIS CLIP PLACEMENT;  Surgeon: Irving Copas., MD;  Location: [facility];  Service: Gastroenterology;;   PARS PLANA VITRECTOMY Right 02/17/2015   Procedure: PARS PLANA VITRECTOMY WITH 25 GAUGE, endolaser, right eye;  Surgeon: Milus Height, MD;  Location: [facility];  Service: Ophthalmology;  Laterality: Right;  fluid pack lot # [id number] H endolaser: 200w, 1439p   PERCUTANEOUS STENT INTERVENTION     right leg   PERIPHERAL VASCULAR BALLOON ANGIOPLASTY Left 03/20/2017   Procedure: PERIPHERAL VASCULAR BALLOON ANGIOPLASTY;  Surgeon: Katha Cabal, MD;  Location: [place] [facility];  Service: Cardiovascular;  Laterality: Left;   [place] CATHETER INSERTION  2013   POLYPECTOMY  06/18/2019   Procedure: POLYPECTOMY;  Surgeon: Mansouraty, Telford Nab.,  MD;  Location: [place];  Service: Gastroenterology;;   SUBMUCOSAL LIFTING INJECTION  06/18/2019   Procedure: SUBMUCOSAL LIFTING INJECTION;  Surgeon: Irving Copas., MD;  Location: [place];  Service: Gastroenterology;;   TOE AMPUTATION     left 5th, right 4th and 5th    Family History  Problem Relation Age of Onset   Heart disease Sister    CAD Brother    Hyperlipidemia Brother    Colon cancer Neg Hx    Esophageal cancer Neg Hx    Inflammatory bowel disease Neg Hx    Liver disease Neg Hx    Pancreatic cancer Neg Hx    Rectal cancer Neg Hx    Stomach cancer Neg Hx    Colon polyps Neg Hx     Social History   Socioeconomic History   Marital status: Divorced  Spouse name: Not on file   Number of children: 0   Years of education: Not on file   Highest education level: Not on file  Occupational History   Occupation: [name]    Comment: Disabled since 2000's  Tobacco Use   Smoking status: Never    Passive exposure: Past   Smokeless tobacco: Never  Vaping Use   Vaping Use: Never used  Substance and Sexual Activity   Alcohol use: No    Alcohol/week: 0.0 standard drinks of alcohol   Drug use: No   Sexual activity: Not on file  Other Topics Concern   Not on file  Social History Narrative   Has living will   Jeannetta Nap, sister, as health care POA.   Would accept resuscitation attempts   Not sure about tube feeds   Social Determinants of Health   Financial Resource Strain: Not on file  Food Insecurity: Not on file  Transportation Needs: Not on file  Physical Activity: Not on file  Stress: Not on file  Social Connections: Not on file  Intimate Partner Violence: Not on file   Review of Systems Weight stable Sleeps well--in bed. Sleeps flat (on side). No PND Nocturia only x 1 lately    Objective:   Physical Exam Constitutional:      Appearance: Normal appearance.  Cardiovascular:     Rate and Rhythm: Normal rate and regular rhythm.      Heart sounds: No murmur heard.    No gallop.     Comments: Faint pedal pulses---slightly better on right Pulmonary:     Effort: Pulmonary effort is normal.     Breath sounds: Normal breath sounds. No wheezing or rales.  Musculoskeletal:     Cervical back: Neck supple.     Right lower leg: No edema.     Left lower leg: No edema.  Lymphadenopathy:     Cervical: No cervical adenopathy.  Skin:    Comments: No foot lesions  Neurological:     Mental Status: He is alert.  Psychiatric:        Mood and Affect: Mood normal.        Behavior: Behavior normal.            Assessment & Plan:

## 2021-08-31 NOTE — Assessment & Plan Note (Signed)
Recent myocardial scan shows 32% EF On entresto 24/26 bid Furosemide 20-40 daily, carvedilol 6.25 bid Now on jardiance

## 2021-08-31 NOTE — Assessment & Plan Note (Signed)
Stable DOE On CHF meds and ASA 81, crestor 20

## 2021-08-31 NOTE — Assessment & Plan Note (Signed)
Mood okay on sertraline 100 No wean

## 2021-09-01 ENCOUNTER — Other Ambulatory Visit: Payer: Self-pay | Admitting: Cardiovascular Disease

## 2021-09-10 ENCOUNTER — Other Ambulatory Visit: Payer: Self-pay | Admitting: Internal Medicine

## 2021-09-20 ENCOUNTER — Ambulatory Visit: Payer: Medicare HMO | Admitting: Medical

## 2021-09-20 ENCOUNTER — Other Ambulatory Visit
Admission: RE | Admit: 2021-09-20 | Discharge: 2021-09-20 | Disposition: A | Discharge status: Home / Self Care | Payer: Medicare HMO | Attending: Medical | Admitting: Medical

## 2021-09-20 DIAGNOSIS — I5020 Unspecified systolic (congestive) heart failure: Secondary | ICD-10-CM | POA: Insufficient documentation

## 2021-09-20 DIAGNOSIS — Z79899 Other long term (current) drug therapy: Secondary | ICD-10-CM | POA: Diagnosis not present

## 2021-09-20 LAB — BASIC METABOLIC PANEL
Anion gap: 7 (ref 5–15)
BUN: 21 mg/dL (ref 8–23)
CO2: 26 mmol/L (ref 22–32)
Calcium: 9.1 mg/dL (ref 8.9–10.3)
Chloride: 107 mmol/L (ref 98–111)
Creatinine, Ser: 1.08 mg/dL (ref 0.61–1.24)
GFR, Estimated: >60 mL/min (ref >60)
Glucose, Bld: 169 mg/dL — ABNORMAL HIGH (ref 70–99)
Potassium: 4 mmol/L (ref 3.5–5.1)
Sodium: 140 mmol/L (ref 135–145)

## 2021-09-20 NOTE — Progress Notes (Deleted)
Cardiology Office Note:    Date:  09/20/2021   ID:  Benjamin Davidson, DOB August 26, 1950, MRN [id number]  PCP:  Venia Carbon, MD  [facility] Cardiologist:  Ida Rogue, MD  [facility] Electrophysiologist:  None   Referring MD: Venia Carbon, MD   Chief Complaint: 3-4 week follow-up  History of Present Illness:    Benjamin Davidson is a 71 y.o. 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    History started in 2019 where he underwent successful coronary artery bypass grafting.  He has also undergone peripheral vascular balloon angioplasty on the left posterior tibial and peroneal artery in 03/20/2017.  He had also been hospitalized in February 2019 and ended up with left great toe osteomyelitis with ulceration with related to the amputation of his left great toe.  He also had gangrene of the fifth toe on his right foot which required amputation of the right fourth and fifth toe as well back in 2013.  He was last seen in the office on 06/21/2021 by Dr. Rockey Situ with complaints of shortness of breath.  Echocardiogram on 07/19/2021 which revealed EF 35 to 40%, left ventricle is moderately decreased function, left ventricle demonstrates regional wall motion abnormalities with hypokinesis of the anterior anteroseptal and apical region.  Grade 1 diastolic dysfunction.  He also underwent Lexiscan stress testing which revealed fixed LV perfusion defects in the mid to apical anterior and anteroseptal walls with some peri-infarct ischemia, findings consistent with prior myocardial infarction and mild peri-infarct ischemia, moderate to severely reduced LV systolic function with an LVEF equal to 32%, coronary artery calcifications involving the LAD/RCA and left circumflex.    Echo 06/2021 for SOB showed reduced in EF 35-40%, HK of anteroseptal and anterior apical region, moderately dilated LV, mild LVH, G1DD, moderately dilated Benjamin, trivial MR, Av sclerosis. Lexiscan showed EF<35%, fixed perfusion defect with peri-infarct ischemia, coronary artery calcifications LAD, RCA and LCx   Last seen 08/22/21 and reported generalized fatigue. Cardiac cath was discussed but again deferred. Jardiance was added.   Today spiro   Past Medical History:  Diagnosis Date   Anemia    hx of   Arthritis    Asthma    childhood   Cataract    CHF (congestive heart failure) ([place])    SHOB at times   Coronary artery disease    Diabetic peripheral neuropathy associated with type 2 diabetes mellitus (Benjamin Davidson)    DM (diabetes mellitus), type 2, uncontrolled, periph vascular complic    Type II   Edema    feet/legs   Hx of colonic polyps    Hypertension    Kidney stones    Left leg DVT ([place])    2000s   MDD (recurrent major depressive disorder) in remission (Benjamin Davidson)    on meds   Mitral regurgitation 08/10/2011   Peripheral vascular disease (Benjamin Davidson)    Left toe amputations. Dr Lucky Cowboy   Pulmonary embolism (Benjamin Davidson) 09/05/2005   Pure hypercholesterolemia    Shortness of breath dyspnea    Syncope    resolved   Wears glasses     Past Surgical History:  Procedure Laterality Date   AMPUTATION TOE Left 03/17/2017   Procedure: FIRST RAY RESECTION LEFT FOOT;  Surgeon: Sharlotte Alamo, DPM;  Location: [facility];  Service: Podiatry;  Laterality: Left;   BIOPSY  06/18/2019   Procedure: BIOPSY;  Surgeon: Rush Landmark Telford Nab., MD;  Location: [place];  Service: Gastroenterology;;   CARDIAC CATHETERIZATION     CATARACT EXTRACTION W/PHACO Left 06/03/2015   Procedure: CATARACT EXTRACTION PHACO AND INTRAOCULAR LENS PLACEMENT ([place]);  Surgeon: Eulogio Bear, MD;  Location: [facility];  Service: Ophthalmology;  Laterality: Left;  Lot# [id number] H [place]: 1.06 AP%: 5.5 CDE: 3.69   CATARACT EXTRACTION W/PHACO  Right 12/10/2017   Procedure: Aborted phacoemusification of the right eye.  No intraocular lens was implated.;  Surgeon: Eulogio Bear, MD;  Location: [place];  Service: Ophthalmology;  Laterality: Right;  Diabetic - insulin   COLONOSCOPY WITH PROPOFOL N/A 06/18/2019   Procedure: COLONOSCOPY WITH PROPOFOL;  Surgeon: Rush Landmark Telford Nab., MD;  Location: [place];  Service: Gastroenterology;  Laterality: N/A;   CORONARY ARTERY BYPASS GRAFT  2008   ENDOSCOPIC MUCOSAL RESECTION N/A 06/18/2019   Procedure: ENDOSCOPIC MUCOSAL RESECTION;  Surgeon: Rush Landmark Telford Nab., MD;  Location: [place];  Service: Gastroenterology;  Laterality: N/A;   ESOPHAGOGASTRODUODENOSCOPY (EGD) WITH PROPOFOL N/A 06/18/2019   Procedure: ESOPHAGOGASTRODUODENOSCOPY (EGD) WITH PROPOFOL;  Surgeon: Rush Landmark Telford Nab., MD;  Location: [place];  Service: Gastroenterology;  Laterality: N/A;   EYE SURGERY     HEMOSTASIS CLIP PLACEMENT  06/18/2019   Procedure: HEMOSTASIS CLIP PLACEMENT;  Surgeon: Irving Copas., MD;  Location: [facility];  Service: Gastroenterology;;   PARS PLANA VITRECTOMY Right 02/17/2015   Procedure: PARS PLANA VITRECTOMY WITH 25 GAUGE, endolaser, right eye;  Surgeon: Milus Height, MD;  Location: [facility];  Service: Ophthalmology;  Laterality: Right;  fluid pack lot # [id number] H endolaser: 200w, 1439p   PERCUTANEOUS STENT INTERVENTION     right leg   PERIPHERAL VASCULAR BALLOON ANGIOPLASTY Left 03/20/2017   Procedure: PERIPHERAL VASCULAR BALLOON ANGIOPLASTY;  Surgeon: Katha Cabal, MD;  Location: [place] [facility];  Service: Cardiovascular;  Laterality: Left;   [place] CATHETER INSERTION  2013   POLYPECTOMY  06/18/2019   Procedure: POLYPECTOMY;  Surgeon: Mansouraty, Telford Nab., MD;  Location: [place];  Service: Gastroenterology;;   SUBMUCOSAL LIFTING INJECTION  06/18/2019   Procedure: SUBMUCOSAL LIFTING INJECTION;  Surgeon:  Irving Copas., MD;  Location: [place];  Service: Gastroenterology;;   TOE AMPUTATION     left 5th, right 4th and 5th    Current Medications: No outpatient medications have been marked as taking for the 09/20/21 encounter (Appointment) with Kathlen Mody, Ellanie Oppedisano H, PA-C.     Allergies:   Losartan and Lisinopril   Social History   Socioeconomic History   Marital status: Divorced    Spouse name: Not on file   Number of children: 0   Years of education: Not on file   Highest education level: Not on file  Occupational History   Occupation: Licensed conveyancer    Comment: Disabled since 2000's  Tobacco Use   Smoking status: Never    Passive exposure: Past   Smokeless tobacco: Never  Vaping Use   Vaping Use: Never used  Substance and Sexual Activity   Alcohol use: No    Alcohol/week: 0.0 standard drinks of alcohol   Drug use: No   Sexual activity: Not on file  Other Topics Concern   Not on file  Social History Narrative   Has living will   Jeannetta Nap, sister, as health care POA.   Would accept resuscitation attempts   Not sure about  tube feeds   Social Determinants of Health   Financial Resource Strain: Not on file  Food Insecurity: Not on file  Transportation Needs: Not on file  Physical Activity: Not on file  Stress: Not on file  Social Connections: Not on file     Family History: The patient's family history includes CAD in his brother; Heart disease in his sister; Hyperlipidemia in his brother. There is no history of Colon cancer, Esophageal cancer, Inflammatory bowel disease, Liver disease, Pancreatic cancer, Rectal cancer, Stomach cancer, or Colon polyps.  ROS:   Please see the history of present illness.     All other systems reviewed and are negative.  EKGs/Labs/Other Studies Reviewed:    The following studies were reviewed today:  Echo 07/19/21  1. Left ventricular ejection fraction, by estimation, is 35 to 40%. The  left ventricle has moderately  decreased function. The left ventricle  demonstrates regional wall motion abnormalities (hypokinesis of the  anterior/anteroseptal and apical region)  Moderately dilated LV. There is mild left ventricular hypertrophy. Left  ventricular diastolic parameters are consistent with Grade I diastolic  dysfunction (impaired relaxation).   2. Right ventricular systolic function is normal. The right ventricular  size is normal.   3. Left atrial size was moderately dilated.   4. The mitral valve is normal in structure. Trivial mitral valve  regurgitation. No evidence of mitral stenosis.   5. The aortic valve was not well visualized. Aortic valve regurgitation  is not visualized. Aortic valve sclerosis is present, with no evidence of  aortic valve stenosis.   6. The inferior vena cava is normal in size with greater than 50%  respiratory variability, suggesting right atrial pressure of 3 mmHg.   Comparison(s): EF 55%.    Myoview lexiscan 07/28/21   Study is high risk due to EF <35%.   There is fixed LV perfusion defect in the mid to apical anterior and anteroseptal walls, with some peri-infarct ischemia   Findings are consistent with prior myocardial infarction with mild peri-infarct ischemia.   There is moderate to severely reduced LV systolic function, LVEF = 32%   There is coronary artery calcifications involving the LAD, RCA LCx  EKG:  EKG is *** ordered today.  The ekg ordered today demonstrates ***  Recent Labs: 04/22/2021: ALT 28; BUN 15; Creatinine, Ser 1.12; Hemoglobin 13.1; Platelets 242.0; Potassium 4.3; Sodium 139  Recent Lipid Panel    Component Value Date/Time   CHOL 120 04/22/2021 1325   TRIG 69.0 04/22/2021 1325   HDL 35.30 (L) 04/22/2021 1325   CHOLHDL 3 04/22/2021 1325   VLDL 13.8 04/22/2021 1325   LDLCALC 71 04/22/2021 1325     Risk Assessment/Calculations:   {Does this patient have ATRIAL FIBRILLATION?:[phone]}   Physical Exam:    VS:  There were no vitals  taken for this visit.    Wt Readings from Last 3 Encounters:  08/31/21 257 lb (116.6 kg)  08/22/21 260 lb (117.9 kg)  08/04/21 254 lb (115.2 kg)     GEN: *** Well nourished, well developed in no acute distress HEENT: Normal NECK: No JVD; No carotid bruits LYMPHATICS: No lymphadenopathy CARDIAC: ***RRR, no murmurs, rubs, gallops RESPIRATORY:  Clear to auscultation without rales, wheezing or rhonchi  ABDOMEN: Soft, non-tender, non-distended MUSCULOSKELETAL:  No edema; No deformity  SKIN: Warm and dry NEUROLOGIC:  Alert and oriented x 3 PSYCHIATRIC:  Normal affect   ASSESSMENT:    No diagnosis found. PLAN:    In order of problems listed  above:  HFrEF  CAD s/p CABG  PAD  HTN  Disposition: Follow up {follow up:15908} with ***   Shared Decision Making/Informed Consent   {Are you ordering a CV Procedure (e.g. stress test, cath, DCCV, TEE, etc)?   Press F2        :[id number]}    Signed, Montasia Chisenhall Ninfa Meeker, PA-C  09/20/2021 8:06 AM    [facility]

## 2021-09-21 ENCOUNTER — Encounter: Payer: Self-pay | Admitting: Medical

## 2021-09-22 DIAGNOSIS — E113511 Type 2 diabetes mellitus with proliferative diabetic retinopathy with macular edema, right eye: Secondary | ICD-10-CM | POA: Diagnosis not present

## 2021-09-22 DIAGNOSIS — H31093 Other chorioretinal scars, bilateral: Secondary | ICD-10-CM | POA: Diagnosis not present

## 2021-09-22 DIAGNOSIS — E113592 Type 2 diabetes mellitus with proliferative diabetic retinopathy without macular edema, left eye: Secondary | ICD-10-CM | POA: Diagnosis not present

## 2021-09-22 DIAGNOSIS — H35033 Hypertensive retinopathy, bilateral: Secondary | ICD-10-CM | POA: Diagnosis not present

## 2021-09-29 ENCOUNTER — Emergency Department
Admission: EM | Admit: 2021-09-29 | Discharge: 2021-09-29 | Disposition: A | Discharge status: Other Acute Inpatient Hospital | Payer: Medicare HMO | Attending: Emergency Medicine | Admitting: Emergency Medicine

## 2021-09-29 ENCOUNTER — Encounter: Payer: Self-pay | Admitting: Emergency Medicine

## 2021-09-29 ENCOUNTER — Emergency Department: Payer: Medicare HMO

## 2021-09-29 ENCOUNTER — Other Ambulatory Visit: Payer: Self-pay

## 2021-09-29 DIAGNOSIS — I110 Hypertensive heart disease with heart failure: Secondary | ICD-10-CM | POA: Diagnosis not present

## 2021-09-29 DIAGNOSIS — M47812 Spondylosis without myelopathy or radiculopathy, cervical region: Secondary | ICD-10-CM | POA: Diagnosis not present

## 2021-09-29 DIAGNOSIS — W19XXXA Unspecified fall, initial encounter: Secondary | ICD-10-CM | POA: Diagnosis not present

## 2021-09-29 DIAGNOSIS — Y92002 Bathroom of unspecified non-institutional (private) residence single-family (private) house as the place of occurrence of the external cause: Secondary | ICD-10-CM | POA: Diagnosis not present

## 2021-09-29 DIAGNOSIS — W182XXA Fall in (into) shower or empty bathtub, initial encounter: Secondary | ICD-10-CM | POA: Insufficient documentation

## 2021-09-29 DIAGNOSIS — I509 Heart failure, unspecified: Secondary | ICD-10-CM | POA: Diagnosis not present

## 2021-09-29 DIAGNOSIS — J9811 Atelectasis: Secondary | ICD-10-CM | POA: Diagnosis not present

## 2021-09-29 DIAGNOSIS — M549 Dorsalgia, unspecified: Secondary | ICD-10-CM | POA: Diagnosis not present

## 2021-09-29 DIAGNOSIS — I10 Essential (primary) hypertension: Secondary | ICD-10-CM | POA: Diagnosis not present

## 2021-09-29 DIAGNOSIS — Z743 Need for continuous supervision: Secondary | ICD-10-CM | POA: Diagnosis not present

## 2021-09-29 DIAGNOSIS — S2241XA Multiple fractures of ribs, right side, initial encounter for closed fracture: Secondary | ICD-10-CM | POA: Insufficient documentation

## 2021-09-29 DIAGNOSIS — E119 Type 2 diabetes mellitus without complications: Secondary | ICD-10-CM | POA: Diagnosis not present

## 2021-09-29 DIAGNOSIS — S37011A Minor contusion of right kidney, initial encounter: Secondary | ICD-10-CM | POA: Diagnosis not present

## 2021-09-29 DIAGNOSIS — M545 Low back pain, unspecified: Secondary | ICD-10-CM | POA: Diagnosis not present

## 2021-09-29 DIAGNOSIS — S0990XA Unspecified injury of head, initial encounter: Secondary | ICD-10-CM | POA: Insufficient documentation

## 2021-09-29 DIAGNOSIS — Z7901 Long term (current) use of anticoagulants: Secondary | ICD-10-CM | POA: Diagnosis not present

## 2021-09-29 DIAGNOSIS — Z7401 Bed confinement status: Secondary | ICD-10-CM | POA: Diagnosis not present

## 2021-09-29 DIAGNOSIS — R531 Weakness: Secondary | ICD-10-CM | POA: Diagnosis not present

## 2021-09-29 DIAGNOSIS — S299XXA Unspecified injury of thorax, initial encounter: Secondary | ICD-10-CM | POA: Diagnosis not present

## 2021-09-29 DIAGNOSIS — R0789 Other chest pain: Secondary | ICD-10-CM | POA: Diagnosis not present

## 2021-09-29 LAB — COMPREHENSIVE METABOLIC PANEL
ALT: 27 U/L (ref 0–44)
AST: 30 U/L (ref 15–41)
Albumin: 4.1 g/dL (ref 3.5–5.0)
Alkaline Phosphatase: 50 U/L (ref 38–126)
Anion gap: 11 (ref 5–15)
BUN: 24 mg/dL — ABNORMAL HIGH (ref 8–23)
CO2: 23 mmol/L (ref 22–32)
Calcium: 9.1 mg/dL (ref 8.9–10.3)
Chloride: 104 mmol/L (ref 98–111)
Creatinine, Ser: 1.18 mg/dL (ref 0.61–1.24)
GFR, Estimated: >60 mL/min (ref >60)
Glucose, Bld: 197 mg/dL — ABNORMAL HIGH (ref 70–99)
Potassium: 3.9 mmol/L (ref 3.5–5.1)
Sodium: 138 mmol/L (ref 135–145)
Total Bilirubin: 1.3 mg/dL — ABNORMAL HIGH (ref 0.3–1.2)
Total Protein: 7.2 g/dL (ref 6.5–8.1)

## 2021-09-29 LAB — CBC WITH DIFFERENTIAL/PLATELET
Abs Immature Granulocytes: 0.07 10*3/uL (ref 0.00–0.07)
Basophils Absolute: 0 10*3/uL (ref 0.0–0.1)
Basophils Relative: 0 %
Eosinophils Absolute: 0.1 10*3/uL (ref 0.0–0.5)
Eosinophils Relative: 0 %
HCT: 41.9 % (ref 39.0–52.0)
Hemoglobin: 13.6 g/dL (ref 13.0–17.0)
Immature Granulocytes: 1 %
Lymphocytes Relative: 8 %
Lymphs Abs: 1.2 10*3/uL (ref 0.7–4.0)
MCH: 28.6 pg (ref 26.0–34.0)
MCHC: 32.5 g/dL (ref 30.0–36.0)
MCV: 88 fL (ref 80.0–100.0)
Monocytes Absolute: 0.9 10*3/uL (ref 0.1–1.0)
Monocytes Relative: 6 %
Neutro Abs: 12.1 10*3/uL — ABNORMAL HIGH (ref 1.7–7.7)
Neutrophils Relative %: 85 %
Platelets: 225 10*3/uL (ref 150–400)
RBC: 4.76 MIL/uL (ref 4.22–5.81)
RDW: 14.1 % (ref 11.5–15.5)
WBC: 14.4 10*3/uL — ABNORMAL HIGH (ref 4.0–10.5)
nRBC: 0 % (ref 0.0–0.2)

## 2021-09-29 LAB — CBG MONITORING, ED: Glucose-Capillary: 184 mg/dL — ABNORMAL HIGH (ref 70–99)

## 2021-09-29 LAB — TROPONIN I (HIGH SENSITIVITY)
Troponin I (High Sensitivity): 21 ng/L — ABNORMAL HIGH (ref <18)
Troponin I (High Sensitivity): 23 ng/L — ABNORMAL HIGH (ref <18)

## 2021-09-29 LAB — LIPASE, BLOOD: Lipase: 29 U/L (ref 11–51)

## 2021-09-29 MED ORDER — ONDANSETRON HCL 4 MG/2ML IJ SOLN
4.0000 mg | Freq: Once | INTRAMUSCULAR | Status: AC
  Administered 2021-09-29: 4 mg via INTRAVENOUS
  Filled 2021-09-29: qty 2

## 2021-09-29 MED ORDER — IOHEXOL 300 MG/ML  SOLN
100.0000 mL | Freq: Once | INTRAMUSCULAR | Status: AC | PRN
Start: 2021-09-29 — End: 2021-09-29
  Administered 2021-09-29: 100 mL via INTRAVENOUS

## 2021-09-29 MED ORDER — MORPHINE SULFATE (PF) 4 MG/ML IV SOLN
4.0000 mg | Freq: Once | INTRAVENOUS | Status: AC
  Administered 2021-09-29: 4 mg via INTRAVENOUS
  Filled 2021-09-29: qty 1

## 2021-09-29 NOTE — ED Provider Notes (Signed)
[facility] Provider Note  Patient Contact: 4:36 PM (approximate)   History   Fall   HPI  Ousmane Seeman Pavelko is a 71 y.o. male with a history of prior DVT, diabetes, hypertension, CHF, peripheral vascular disease, PE and anemia currently anticoagulated with Xarelto, presents to the emergency department with right-sided chest pain and abdominal pain after patient slipped and fell in the shower.  Patient seems altered and has been vomiting since being in the emergency department. He is uncertain if he hit his head.  He denies chest tightness or shortness of breath.      Physical Exam   Triage Vital Signs: ED Triage Vitals [09/29/21 1545]  Enc Vitals Group     BP (!) 131/113     Pulse Rate 69     Resp 18     Temp 97.7 F (36.5 C)     Temp Source Oral     SpO2 95 %     Weight 260 lb (117.9 kg)     Height '5\' 9"'$  (1.753 m)     Head Circumference      Peak Flow      Pain Score 8     Pain Loc      Pain Edu?      Excl. in [place]?     Most recent vital signs: Vitals:   09/29/21 2110 09/29/21 2216  BP: (!) 162/70 (!) 153/67  Pulse: 72 76  Resp: 18 18  Temp: 98.5 F (36.9 C) 98.5 F (36.9 C)  SpO2: 92% 97%     General: Alert and in no acute distress. Eyes:  PERRL. EOMI Head: No acute traumatic findings ENT:      Nose: No congestion/rhinnorhea.      Mouth/Throat: Mucous membranes are moist. Neck: No stridor. No cervical spine tenderness to palpation. Cardiovascular:  Good peripheral perfusion.  Patient has tenderness to palpation along right lateral ribs. Respiratory: Normal respiratory effort without tachypnea or retractions. Lungs CTAB. Good air entry to the bases with no decreased or absent breath sounds. Gastrointestinal: Bowel sounds 4 quadrants.  Abdomen is soft with guarding in the right upper quadrant.   Musculoskeletal: Full range of motion to all extremities.  Neurologic:  No gross focal neurologic deficits are appreciated.  Skin:   No  rash noted Other:   ED Results / Procedures / Treatments   Labs (all labs ordered are listed, but only abnormal results are displayed) Labs Reviewed  CBC WITH DIFFERENTIAL/PLATELET - Abnormal; Notable for the following components:      Result Value   WBC 14.4 (*)    Neutro Abs 12.1 (*)    All other components within normal limits  COMPREHENSIVE METABOLIC PANEL - Abnormal; Notable for the following components:   Glucose, Bld 197 (*)    BUN 24 (*)    Total Bilirubin 1.3 (*)    All other components within normal limits  CBG MONITORING, ED - Abnormal; Notable for the following components:   Glucose-Capillary 184 (*)    All other components within normal limits  TROPONIN I (HIGH SENSITIVITY) - Abnormal; Notable for the following components:   Troponin I (High Sensitivity) 21 (*)    All other components within normal limits  TROPONIN I (HIGH SENSITIVITY) - Abnormal; Notable for the following components:   Troponin I (High Sensitivity) 23 (*)    All other components within normal limits  LIPASE, BLOOD     EKG     RADIOLOGY  I personally  viewed and evaluated these images as part of my medical decision making, as well as reviewing the written report by the radiologist.  ED Provider Interpretation: Patient has nondisplaced right seventh rib fracture.  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  Radiologist recommends nonemergent MRI brain with contrast.  Patient has signs of stenosis at right ICA and radiologist recommends nonemergent outpatient carotid ultrasound.   Patient has nondisplaced right seventh through 11th rib fractures and grade 1 subcapsular perirenal hematoma  No acute abnormality on CTs of the lumbar and thoracic spine. PROCEDURES:  Critical Care performed: No  Procedures   MEDICATIONS ORDERED IN ED: Medications  morphine (PF) 4 MG/ML injection 4 mg (4 mg Intravenous Given 09/29/21  1643)  ondansetron (ZOFRAN) injection 4 mg (4 mg Intravenous Given 09/29/21 1643)  iohexol (OMNIPAQUE) 300 MG/ML solution 100 mL (100 mLs Intravenous Contrast Given 09/29/21 1724)     IMPRESSION / MDM / ASSESSMENT AND PLAN / ED COURSE  I reviewed the triage vital signs and the nursing notes.                              Assessment and plan Fall 71 year old male presents to the emergency department after patient had a mechanical fall while in the shower complaining of right lateral chest pain and right upper quadrant abdominal pain.  Vital signs are reassuring at triage.  On exam, patient seemed mildly lethargic and altered, stating that it felt better when he kept his eyes closed.  He was profoundly tender with guarding in the right upper quadrant.  Differential diagnosis included intracranial bleed, skull fracture rib fracture, pneumothorax, hemopneumothorax, visceral laceration,  intracranial bleed...  We will obtain basic labs, CT head, CT chest abdomen pelvis and CTs of the thoracic, lumbar and cervical spine and will reassess.   CBC, CMP and lipase reassuring.  Delta troponin 21 and 23 respectively.  No evidence of intracranial bleed or skull fracture on head CT.  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  Radiologist recommends nonemergent MRI brain with contrast.  No evidence of acute fracture on CT of the cervical spine.  Patient has signs of stenosis at right ICA and radiologist recommends nonemergent outpatient carotid ultrasound.   CT abdomen pelvis results indicated nondisplaced seventh through 11th rib fractures with grade 1 subcapsular perirenal hematoma.  I reached out to general surgeon on-call, Dr. Dahlia Byes and reviewed results of CT abdomen pelvis and he recommended transfer as patient will need continued observation under the care of a trauma service.  I reached out to [facility] initially who is currently  suspending auto acceptance for trauma transfers due to high trauma census.  They recommended reaching out to other tertiary centers for potential transfer.  I reached out to [facility] who accepted patient as an ED to ED transfer under the care of Dr. Doyne Keel.      FINAL CLINICAL IMPRESSION(S) / ED DIAGNOSES   Final diagnoses:  Closed fracture of five ribs of right side, initial encounter  Hematoma of right kidney, initial encounter     Rx / DC Orders   ED Discharge Orders     None        Note:  This document was prepared using Dragon voice recognition software and may include unintentional dictation errors.   Vallarie Mare [place], [place] 09/29/21 2316  Rada Hay, MD 10/02/21 [phone]

## 2021-09-29 NOTE — ED Triage Notes (Signed)
Patient to ED for fall. Patient states he was in the shower and slipped and fell. Patient c/o right rib pain. Denies SOB or LOC.

## 2021-09-29 NOTE — ED Notes (Signed)
Pt placed on 1 liter oxygen [place].

## 2021-09-29 NOTE — ED Notes (Signed)
Report called to [facility] er charge nurse vicky loper rn

## 2021-09-29 NOTE — ED Notes (Signed)
Pt alert, iv in place denies pain at this time.  Skin warm and dry.

## 2021-09-29 NOTE — ED Notes (Signed)
Waiting on [facility] to arrive for transport to [facility] ED

## 2021-09-29 NOTE — Discharge Instructions (Addendum)
Please contact your primary care provider about some important incidental findings that were identified on your imaging today. Please contact your primary care provider to order a carotid ultrasound.  Plaque formation on the right was identified on your CT of your cervical spine. 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

## 2021-09-30 DIAGNOSIS — I110 Hypertensive heart disease with heart failure: Secondary | ICD-10-CM | POA: Diagnosis not present

## 2021-09-30 DIAGNOSIS — M1990 Unspecified osteoarthritis, unspecified site: Secondary | ICD-10-CM | POA: Diagnosis not present

## 2021-09-30 DIAGNOSIS — I6521 Occlusion and stenosis of right carotid artery: Secondary | ICD-10-CM | POA: Diagnosis not present

## 2021-09-30 DIAGNOSIS — Z89422 Acquired absence of other left toe(s): Secondary | ICD-10-CM | POA: Diagnosis not present

## 2021-09-30 DIAGNOSIS — D320 Benign neoplasm of cerebral meninges: Secondary | ICD-10-CM | POA: Diagnosis not present

## 2021-09-30 DIAGNOSIS — Z794 Long term (current) use of insulin: Secondary | ICD-10-CM | POA: Diagnosis not present

## 2021-09-30 DIAGNOSIS — S2241XA Multiple fractures of ribs, right side, initial encounter for closed fracture: Secondary | ICD-10-CM | POA: Diagnosis not present

## 2021-09-30 DIAGNOSIS — I2510 Atherosclerotic heart disease of native coronary artery without angina pectoris: Secondary | ICD-10-CM | POA: Diagnosis not present

## 2021-09-30 DIAGNOSIS — Z8719 Personal history of other diseases of the digestive system: Secondary | ICD-10-CM | POA: Diagnosis not present

## 2021-09-30 DIAGNOSIS — E785 Hyperlipidemia, unspecified: Secondary | ICD-10-CM | POA: Diagnosis not present

## 2021-09-30 DIAGNOSIS — Z7901 Long term (current) use of anticoagulants: Secondary | ICD-10-CM | POA: Diagnosis not present

## 2021-09-30 DIAGNOSIS — Z7982 Long term (current) use of aspirin: Secondary | ICD-10-CM | POA: Diagnosis not present

## 2021-09-30 DIAGNOSIS — Z951 Presence of aortocoronary bypass graft: Secondary | ICD-10-CM | POA: Diagnosis not present

## 2021-09-30 DIAGNOSIS — R69 Illness, unspecified: Secondary | ICD-10-CM | POA: Diagnosis not present

## 2021-09-30 DIAGNOSIS — Z87442 Personal history of urinary calculi: Secondary | ICD-10-CM | POA: Diagnosis not present

## 2021-09-30 DIAGNOSIS — Z79899 Other long term (current) drug therapy: Secondary | ICD-10-CM | POA: Diagnosis not present

## 2021-09-30 DIAGNOSIS — N179 Acute kidney failure, unspecified: Secondary | ICD-10-CM | POA: Diagnosis not present

## 2021-09-30 DIAGNOSIS — E1165 Type 2 diabetes mellitus with hyperglycemia: Secondary | ICD-10-CM | POA: Diagnosis not present

## 2021-09-30 DIAGNOSIS — S37041A Minor laceration of right kidney, initial encounter: Secondary | ICD-10-CM | POA: Diagnosis not present

## 2021-09-30 DIAGNOSIS — Z86711 Personal history of pulmonary embolism: Secondary | ICD-10-CM | POA: Diagnosis not present

## 2021-09-30 DIAGNOSIS — J45909 Unspecified asthma, uncomplicated: Secondary | ICD-10-CM | POA: Diagnosis not present

## 2021-09-30 DIAGNOSIS — I509 Heart failure, unspecified: Secondary | ICD-10-CM | POA: Diagnosis not present

## 2021-09-30 DIAGNOSIS — E1151 Type 2 diabetes mellitus with diabetic peripheral angiopathy without gangrene: Secondary | ICD-10-CM | POA: Diagnosis not present

## 2021-09-30 DIAGNOSIS — W182XXA Fall in (into) shower or empty bathtub, initial encounter: Secondary | ICD-10-CM | POA: Diagnosis not present

## 2021-10-02 DIAGNOSIS — S2241XA Multiple fractures of ribs, right side, initial encounter for closed fracture: Secondary | ICD-10-CM | POA: Diagnosis not present

## 2021-10-04 ENCOUNTER — Telehealth: Payer: Self-pay | Admitting: *Deleted

## 2021-10-04 ENCOUNTER — Telehealth: Payer: Self-pay

## 2021-10-04 DIAGNOSIS — I509 Heart failure, unspecified: Secondary | ICD-10-CM | POA: Diagnosis not present

## 2021-10-04 DIAGNOSIS — Z951 Presence of aortocoronary bypass graft: Secondary | ICD-10-CM | POA: Diagnosis not present

## 2021-10-04 DIAGNOSIS — I2510 Atherosclerotic heart disease of native coronary artery without angina pectoris: Secondary | ICD-10-CM | POA: Diagnosis not present

## 2021-10-04 DIAGNOSIS — R69 Illness, unspecified: Secondary | ICD-10-CM | POA: Diagnosis not present

## 2021-10-04 DIAGNOSIS — Z7901 Long term (current) use of anticoagulants: Secondary | ICD-10-CM | POA: Diagnosis not present

## 2021-10-04 DIAGNOSIS — E1151 Type 2 diabetes mellitus with diabetic peripheral angiopathy without gangrene: Secondary | ICD-10-CM | POA: Diagnosis not present

## 2021-10-04 DIAGNOSIS — I110 Hypertensive heart disease with heart failure: Secondary | ICD-10-CM | POA: Diagnosis not present

## 2021-10-04 DIAGNOSIS — Z86718 Personal history of other venous thrombosis and embolism: Secondary | ICD-10-CM | POA: Diagnosis not present

## 2021-10-04 DIAGNOSIS — J45909 Unspecified asthma, uncomplicated: Secondary | ICD-10-CM | POA: Diagnosis not present

## 2021-10-04 DIAGNOSIS — Z86711 Personal history of pulmonary embolism: Secondary | ICD-10-CM | POA: Diagnosis not present

## 2021-10-04 DIAGNOSIS — Z79899 Other long term (current) drug therapy: Secondary | ICD-10-CM | POA: Diagnosis not present

## 2021-10-04 DIAGNOSIS — Z7984 Long term (current) use of oral hypoglycemic drugs: Secondary | ICD-10-CM | POA: Diagnosis not present

## 2021-10-04 DIAGNOSIS — Z9841 Cataract extraction status, right eye: Secondary | ICD-10-CM | POA: Diagnosis not present

## 2021-10-04 DIAGNOSIS — Z87442 Personal history of urinary calculi: Secondary | ICD-10-CM | POA: Diagnosis not present

## 2021-10-04 DIAGNOSIS — Z792 Long term (current) use of antibiotics: Secondary | ICD-10-CM | POA: Diagnosis not present

## 2021-10-04 DIAGNOSIS — Z89422 Acquired absence of other left toe(s): Secondary | ICD-10-CM | POA: Diagnosis not present

## 2021-10-04 DIAGNOSIS — D649 Anemia, unspecified: Secondary | ICD-10-CM | POA: Diagnosis not present

## 2021-10-04 DIAGNOSIS — Z9181 History of falling: Secondary | ICD-10-CM | POA: Diagnosis not present

## 2021-10-04 DIAGNOSIS — Z86010 Personal history of colonic polyps: Secondary | ICD-10-CM | POA: Diagnosis not present

## 2021-10-04 DIAGNOSIS — E7800 Pure hypercholesterolemia, unspecified: Secondary | ICD-10-CM | POA: Diagnosis not present

## 2021-10-04 DIAGNOSIS — I059 Rheumatic mitral valve disease, unspecified: Secondary | ICD-10-CM | POA: Diagnosis not present

## 2021-10-04 DIAGNOSIS — Z9842 Cataract extraction status, left eye: Secondary | ICD-10-CM | POA: Diagnosis not present

## 2021-10-04 DIAGNOSIS — S2241XD Multiple fractures of ribs, right side, subsequent encounter for fracture with routine healing: Secondary | ICD-10-CM | POA: Diagnosis not present

## 2021-10-04 DIAGNOSIS — M1990 Unspecified osteoarthritis, unspecified site: Secondary | ICD-10-CM | POA: Diagnosis not present

## 2021-10-04 NOTE — Telephone Encounter (Signed)
Left message to call office to see how he is doing after recent hospital stay. He needs a 30 min hospital f/u visit with Dr Silvio Pate.

## 2021-10-04 NOTE — Patient Outreach (Signed)
  Care Coordination [facility] Note Transition Care Management Unsuccessful Follow-up Telephone Call  Date of discharge and from where:  10/02/21 [facility]  Attempts:  1st Attempt  Reason for unsuccessful TCM follow-up call:  Left voice message.  Emelia Loron RN, BSN [place] [phone] [email or web address]

## 2021-10-05 ENCOUNTER — Telehealth: Payer: Self-pay | Admitting: *Deleted

## 2021-10-05 NOTE — Patient Outreach (Signed)
  Care Coordination [facility] Note Transition Care Management Unsuccessful Follow-up Telephone Call  Date of discharge and from where: 10/02/21  [facility]   Attempts:  3rd Attempt  Reason for unsuccessful TCM follow-up call:  Left voice message.  Emelia Loron RN, BSN [place] [phone] [email or web address]

## 2021-10-06 NOTE — Telephone Encounter (Signed)
Pt called ans scheduled HFU 10-24-21.

## 2021-10-10 ENCOUNTER — Telehealth: Payer: Self-pay | Admitting: Internal Medicine

## 2021-10-10 NOTE — Telephone Encounter (Signed)
Benjamin Davidson called in to request  a referral for Physical Therapy for pt . Please advise # [phone]

## 2021-10-11 NOTE — Telephone Encounter (Signed)
Called and spoke to [place]. She is his sister. She is on his DPR. He is scheduled for a hospital f/u on 10-24-21 as they were told that was the 1st thing Dr Silvio Pate had available. [facility] did not initiate PT for him. She is asking if he could be seen sooner. (We do only have 15 min appts up to the 25th)

## 2021-10-12 NOTE — Telephone Encounter (Signed)
Spoke to pt's sister, Hilda Blades. She will make sure he gets here tomorrow. Moved his appt to 10-13-21 at 145.

## 2021-10-13 ENCOUNTER — Ambulatory Visit (INDEPENDENT_AMBULATORY_CARE_PROVIDER_SITE_OTHER): Payer: Medicare HMO | Admitting: Internal Medicine

## 2021-10-13 ENCOUNTER — Encounter: Payer: Self-pay | Admitting: Internal Medicine

## 2021-10-13 VITALS — BP 108/60 | HR 88 | Temp 97.3°F | Ht 69.0 in | Wt 254.0 lb

## 2021-10-13 DIAGNOSIS — I6529 Occlusion and stenosis of unspecified carotid artery: Secondary | ICD-10-CM | POA: Diagnosis not present

## 2021-10-13 DIAGNOSIS — D329 Benign neoplasm of meninges, unspecified: Secondary | ICD-10-CM

## 2021-10-13 DIAGNOSIS — S2241XD Multiple fractures of ribs, right side, subsequent encounter for fracture with routine healing: Secondary | ICD-10-CM

## 2021-10-13 DIAGNOSIS — E1142 Type 2 diabetes mellitus with diabetic polyneuropathy: Secondary | ICD-10-CM

## 2021-10-13 DIAGNOSIS — S2241XA Multiple fractures of ribs, right side, initial encounter for closed fracture: Secondary | ICD-10-CM | POA: Insufficient documentation
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
## 2021-10-13 NOTE — Assessment & Plan Note (Signed)
Has been gradually improving  Now back home Off the oxycodone Still getting PT/OT at home

## 2021-10-13 NOTE — Assessment & Plan Note (Signed)
High grade stenosis on right side No clear symptoms but seems to have some mental slowing (more consistent with early vascular dementia as opposed to aphasia)---but will follow up with ultrasound

## 2021-10-13 NOTE — Assessment & Plan Note (Signed)
Incidental finding on CT scan of head Will proceed with contrasted MRI to better characterize it
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
## 2021-10-18 DIAGNOSIS — M1990 Unspecified osteoarthritis, unspecified site: Secondary | ICD-10-CM | POA: Diagnosis not present

## 2021-10-18 DIAGNOSIS — I059 Rheumatic mitral valve disease, unspecified: Secondary | ICD-10-CM | POA: Diagnosis not present

## 2021-10-18 DIAGNOSIS — Z9842 Cataract extraction status, left eye: Secondary | ICD-10-CM

## 2021-10-18 DIAGNOSIS — Z86711 Personal history of pulmonary embolism: Secondary | ICD-10-CM

## 2021-10-18 DIAGNOSIS — Z951 Presence of aortocoronary bypass graft: Secondary | ICD-10-CM

## 2021-10-18 DIAGNOSIS — Z79899 Other long term (current) drug therapy: Secondary | ICD-10-CM

## 2021-10-18 DIAGNOSIS — S2241XD Multiple fractures of ribs, right side, subsequent encounter for fracture with routine healing: Secondary | ICD-10-CM | POA: Diagnosis not present

## 2021-10-18 DIAGNOSIS — I110 Hypertensive heart disease with heart failure: Secondary | ICD-10-CM | POA: Diagnosis not present

## 2021-10-18 DIAGNOSIS — Z86010 Personal history of colonic polyps: Secondary | ICD-10-CM

## 2021-10-18 DIAGNOSIS — Z89422 Acquired absence of other left toe(s): Secondary | ICD-10-CM

## 2021-10-18 DIAGNOSIS — Z87442 Personal history of urinary calculi: Secondary | ICD-10-CM

## 2021-10-18 DIAGNOSIS — J45909 Unspecified asthma, uncomplicated: Secondary | ICD-10-CM | POA: Diagnosis not present

## 2021-10-18 DIAGNOSIS — D649 Anemia, unspecified: Secondary | ICD-10-CM | POA: Diagnosis not present

## 2021-10-18 DIAGNOSIS — Z9181 History of falling: Secondary | ICD-10-CM

## 2021-10-18 DIAGNOSIS — E7800 Pure hypercholesterolemia, unspecified: Secondary | ICD-10-CM | POA: Diagnosis not present

## 2021-10-18 DIAGNOSIS — E1151 Type 2 diabetes mellitus with diabetic peripheral angiopathy without gangrene: Secondary | ICD-10-CM | POA: Diagnosis not present

## 2021-10-18 DIAGNOSIS — Z792 Long term (current) use of antibiotics: Secondary | ICD-10-CM

## 2021-10-18 DIAGNOSIS — F32A Depression, unspecified: Secondary | ICD-10-CM

## 2021-10-18 DIAGNOSIS — I509 Heart failure, unspecified: Secondary | ICD-10-CM | POA: Diagnosis not present

## 2021-10-18 DIAGNOSIS — Z9841 Cataract extraction status, right eye: Secondary | ICD-10-CM

## 2021-10-18 DIAGNOSIS — I2510 Atherosclerotic heart disease of native coronary artery without angina pectoris: Secondary | ICD-10-CM | POA: Diagnosis not present

## 2021-10-18 DIAGNOSIS — Z7901 Long term (current) use of anticoagulants: Secondary | ICD-10-CM | POA: Diagnosis not present

## 2021-10-18 DIAGNOSIS — Z86718 Personal history of other venous thrombosis and embolism: Secondary | ICD-10-CM

## 2021-10-18 DIAGNOSIS — Z7984 Long term (current) use of oral hypoglycemic drugs: Secondary | ICD-10-CM

## 2021-10-18 DIAGNOSIS — R69 Illness, unspecified: Secondary | ICD-10-CM | POA: Diagnosis not present

## 2021-10-21 ENCOUNTER — Telehealth: Payer: Self-pay

## 2021-10-21 DIAGNOSIS — D329 Benign neoplasm of meninges, unspecified: Secondary | ICD-10-CM

## 2021-10-21 NOTE — Addendum Note (Signed)
Addended by: Viviana Simpler I on: 10/21/2021 01:18 PM   Modules accepted: Orders

## 2021-10-21 NOTE — Telephone Encounter (Signed)
[facility] called stating they cannot do MRI Brain with contrast. It has to be MRI w/wo or without. Please place new orders for what you are wanting them to change it to. They will be looking for the new order.

## 2021-10-24 ENCOUNTER — Inpatient Hospital Stay: Payer: Medicare HMO | Admitting: Internal Medicine

## 2021-10-24 NOTE — Telephone Encounter (Signed)
They were not able to give me the actual code, but it needs to be an IMG and not VAS.

## 2021-10-24 NOTE — Telephone Encounter (Signed)
I sent a secure chat to [employer] letting her know.

## 2021-10-24 NOTE — Telephone Encounter (Signed)
L[place]hia from [facility] called stating the orders that was sent in for the ultrasound was for vascular & it needs to be corrected. If vascular is required, she stated they would not schedule the appt, the vascular dept would, # is [phone]. Ellison Hughs Cobb states if there are any furhter questions, contact her via secure chat or call [phone].

## 2021-10-25 ENCOUNTER — Telehealth: Payer: Self-pay | Admitting: Internal Medicine

## 2021-10-25 NOTE — Telephone Encounter (Signed)
I believe Dr Silvio Pate meant for cardiology to order that for him. There is another message about this.

## 2021-10-25 NOTE — Telephone Encounter (Signed)
Patient sister wanted to know where the Ultrasound is being done at. Call back number [phone].

## 2021-10-27 ENCOUNTER — Ambulatory Visit
Admission: RE | Admit: 2021-10-27 | Discharge: 2021-10-27 | Disposition: A | Discharge status: Home / Self Care | Payer: Medicare HMO | Source: Ambulatory Visit | Attending: Internal Medicine | Admitting: Internal Medicine

## 2021-10-27 DIAGNOSIS — D496 Neoplasm of unspecified behavior of brain: Secondary | ICD-10-CM | POA: Diagnosis not present

## 2021-10-27 DIAGNOSIS — D329 Benign neoplasm of meninges, unspecified: Secondary | ICD-10-CM | POA: Diagnosis not present

## 2021-10-27 DIAGNOSIS — G08 Intracranial and intraspinal phlebitis and thrombophlebitis: Secondary | ICD-10-CM | POA: Diagnosis not present

## 2021-10-27 DIAGNOSIS — R220 Localized swelling, mass and lump, head: Secondary | ICD-10-CM | POA: Diagnosis not present

## 2021-10-27 MED ORDER — GADOPICLENOL 0.5 MMOL/ML IV SOLN
10.0000 mL | Freq: Once | INTRAVENOUS | Status: AC | PRN
  Administered 2021-10-27: 10 mL via INTRAVENOUS

## 2021-10-28 NOTE — Telephone Encounter (Signed)
Pt's sister called checking on status for the orders for her brother? Call back # [phone]

## 2021-10-31 NOTE — Telephone Encounter (Signed)
Patient sister Hilda Blades called in to follow up with the referral for an Ultrasound. She can be reached at [phone]. Thank you!

## 2021-11-02 NOTE — Telephone Encounter (Signed)
Pt's sister called asking for an update on the referral for the pt? Call back # [phone]

## 2021-11-02 NOTE — Telephone Encounter (Signed)
Patient called and asked about a referral.

## 2021-11-03 DIAGNOSIS — E113511 Type 2 diabetes mellitus with proliferative diabetic retinopathy with macular edema, right eye: Secondary | ICD-10-CM | POA: Diagnosis not present

## 2021-11-03 DIAGNOSIS — H31013 Macula scars of posterior pole (postinflammatory) (post-traumatic), bilateral: Secondary | ICD-10-CM | POA: Diagnosis not present

## 2021-11-03 DIAGNOSIS — E113592 Type 2 diabetes mellitus with proliferative diabetic retinopathy without macular edema, left eye: Secondary | ICD-10-CM | POA: Diagnosis not present

## 2021-11-03 DIAGNOSIS — H3582 Retinal ischemia: Secondary | ICD-10-CM | POA: Diagnosis not present

## 2021-11-03 NOTE — Addendum Note (Signed)
Addended by: Kris Mouton on: 11/03/2021 09:50 AM   Modules accepted: Orders

## 2021-11-03 NOTE — Telephone Encounter (Signed)
Spoke with Valente Aydien and Dorothy at [place], updated Vas US carotid order to the correct location and Earlie Server will call patient to schedule.

## 2021-11-08 ENCOUNTER — Ambulatory Visit (HOSPITAL_COMMUNITY)
Admission: RE | Admit: 2021-11-08 | Discharge: 2021-11-08 | Disposition: A | Discharge status: Home / Self Care | Payer: Medicare HMO | Source: Ambulatory Visit | Attending: Internal Medicine | Admitting: Internal Medicine

## 2021-11-08 DIAGNOSIS — I6529 Occlusion and stenosis of unspecified carotid artery: Secondary | ICD-10-CM | POA: Insufficient documentation

## 2021-11-09 ENCOUNTER — Other Ambulatory Visit: Payer: Self-pay | Admitting: Cardiology

## 2021-11-09 NOTE — Telephone Encounter (Signed)
Please contact pt for future appointment. Pt needing refills. 

## 2021-11-16 ENCOUNTER — Other Ambulatory Visit: Payer: Self-pay | Admitting: Cardiovascular Disease

## 2021-11-18 DIAGNOSIS — E1142 Type 2 diabetes mellitus with diabetic polyneuropathy: Secondary | ICD-10-CM | POA: Diagnosis not present

## 2021-11-28 ENCOUNTER — Encounter (INDEPENDENT_AMBULATORY_CARE_PROVIDER_SITE_OTHER): Payer: Self-pay

## 2021-11-30 ENCOUNTER — Other Ambulatory Visit: Payer: Self-pay | Admitting: Internal Medicine

## 2021-12-15 DIAGNOSIS — E113511 Type 2 diabetes mellitus with proliferative diabetic retinopathy with macular edema, right eye: Secondary | ICD-10-CM | POA: Diagnosis not present

## 2021-12-16 ENCOUNTER — Encounter: Payer: Self-pay | Admitting: Medical

## 2021-12-16 ENCOUNTER — Ambulatory Visit: Payer: Medicare HMO | Attending: Medical | Admitting: Medical

## 2021-12-16 VITALS — BP 118/88 | HR 79 | Ht 69.0 in | Wt 256.6 lb

## 2021-12-16 DIAGNOSIS — I10 Essential (primary) hypertension: Secondary | ICD-10-CM

## 2021-12-16 DIAGNOSIS — I5022 Chronic systolic (congestive) heart failure: Secondary | ICD-10-CM

## 2021-12-16 DIAGNOSIS — I2510 Atherosclerotic heart disease of native coronary artery without angina pectoris: Secondary | ICD-10-CM

## 2021-12-16 DIAGNOSIS — I255 Ischemic cardiomyopathy: Secondary | ICD-10-CM | POA: Diagnosis not present

## 2021-12-16 MED ORDER — SPIRONOLACTONE 25 MG PO TABS: 12.5000 mg | ORAL_TABLET | Freq: Every day | ORAL | 3 refills | Status: DC

## 2021-12-16 NOTE — Progress Notes (Unsigned)
Cardiology Office Note:    Date:  12/19/2021   ID:  Benjamin Davidson, DOB 08-30-1950, MRN [id number]  PCP:  Venia Carbon, MD  [facility] Cardiologist:  Ida Rogue, MD  [facility] Electrophysiologist:  None   Referring MD: Venia Carbon, MD   Chief Complaint: 3 month follow-up  History of Present Illness:    Benjamin Davidson is a 71 y.o. 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   CAD history started in 2019 where he underwent successful coronary artery bypass grafting.  He has also undergone peripheral vascular balloon angioplasty on the left posterior tibial and peroneal artery in 03/20/2017.  He had also been hospitalized in February 2019 and ended up with left great toe osteomyelitis with ulceration with related to the amputation of his left great toe.  He also had gangrene of the fifth toe on his right foot which required amputation of the right fourth and fifth toe as well back in 2013.  He was last seen in the office on 06/21/2021 by Dr. Rockey Situ with complaints of shortness of breath.  Echocardiogram on 07/19/2021 which revealed EF 35 to 40%, left ventricle is moderately decreased function, left ventricle demonstrates regional wall motion abnormalities with hypokinesis of the anterior anteroseptal and apical region.  Grade 1 diastolic dysfunction.  He also underwent Lexiscan stress testing which revealed fixed LV perfusion defects in the mid to apical anterior and anteroseptal walls with some peri-infarct ischemia, findings consistent with prior myocardial infarction and mild peri-infarct ischemia, moderate to severely reduced LV systolic function with an LVEF equal to 32%, coronary artery calcifications involving the LAD/RCA and left  circumflex.   Echo 06/2021 for SOB showed reduced in EF 35-40%, HK of anteroseptal and anterior apical region, moderately dilated LV, mild LVH, G1DD, moderately dilated LA, trivial MR, AV sclerosis. Lexiscan showed EF<35%, fixed perfusion defect with peri-infarct ischemia, coronary artery calcifications LAD, RCA and LCx   The patient was seen 08/04/21 and reported persistent SOB and lower leg edema. GDMT vs heart cath was discussed, the patient decided to pursue medication management.  Patient was last seen 08/22/2021 and reported overall fatigue.  Cardiac cath again was discussed, however he wished to defer at that time. Jardiance was added.  Today, the patient reports he is overall tired. He has constant SOB on exertion. No chest pain. Patient is very sedentary at baseline.   Past Medical History:  Diagnosis Date   Anemia    hx of   Arthritis    Asthma    childhood   Cataract    CHF (congestive heart failure) ([place])    SHOB at times   Coronary artery disease    Diabetic peripheral neuropathy associated with type 2 diabetes mellitus (HCC)    DM (diabetes mellitus), type 2, uncontrolled, periph vascular complic    Type II   Edema    feet/legs   Hx of colonic polyps    Hypertension    Kidney stones    Left leg DVT ([place])    2000s   MDD (recurrent major depressive disorder) in remission ([place])    on meds   Mitral regurgitation 08/10/2011   Peripheral vascular disease ([place])    Left toe amputations. Dr Lucky Cowboy   Pulmonary embolism (  [place]) 09/05/2005   Pure hypercholesterolemia    Shortness of breath dyspnea    Syncope    resolved   Wears glasses     Past Surgical History:  Procedure Laterality Date   AMPUTATION TOE Left 03/17/2017   Procedure: FIRST RAY RESECTION LEFT FOOT;  Surgeon: Sharlotte Alamo, DPM;  Location: [facility];  Service: Podiatry;  Laterality: Left;   BIOPSY  06/18/2019   Procedure: BIOPSY;  Surgeon: Rush Landmark Telford Nab., MD;  Location: [place];  Service:  Gastroenterology;;   CARDIAC CATHETERIZATION     CATARACT EXTRACTION W/PHACO Left 06/03/2015   Procedure: CATARACT EXTRACTION PHACO AND INTRAOCULAR LENS PLACEMENT ([place]);  Surgeon: Eulogio Bear, MD;  Location: [facility];  Service: Ophthalmology;  Laterality: Left;  Lot# [id number] H [place]: 1.06 AP%: 5.5 CDE: 3.69   CATARACT EXTRACTION W/PHACO Right 12/10/2017   Procedure: Aborted phacoemusification of the right eye.  No intraocular lens was implated.;  Surgeon: Eulogio Bear, MD;  Location: [place];  Service: Ophthalmology;  Laterality: Right;  Diabetic - insulin   COLONOSCOPY WITH PROPOFOL N/A 06/18/2019   Procedure: COLONOSCOPY WITH PROPOFOL;  Surgeon: Rush Landmark Telford Nab., MD;  Location: [place];  Service: Gastroenterology;  Laterality: N/A;   CORONARY ARTERY BYPASS GRAFT  2008   ENDOSCOPIC MUCOSAL RESECTION N/A 06/18/2019   Procedure: ENDOSCOPIC MUCOSAL RESECTION;  Surgeon: Rush Landmark Telford Nab., MD;  Location: [place];  Service: Gastroenterology;  Laterality: N/A;   ESOPHAGOGASTRODUODENOSCOPY (EGD) WITH PROPOFOL N/A 06/18/2019   Procedure: ESOPHAGOGASTRODUODENOSCOPY (EGD) WITH PROPOFOL;  Surgeon: Rush Landmark Telford Nab., MD;  Location: [place];  Service: Gastroenterology;  Laterality: N/A;   EYE SURGERY     HEMOSTASIS CLIP PLACEMENT  06/18/2019   Procedure: HEMOSTASIS CLIP PLACEMENT;  Surgeon: Irving Copas., MD;  Location: [facility];  Service: Gastroenterology;;   PARS PLANA VITRECTOMY Right 02/17/2015   Procedure: PARS PLANA VITRECTOMY WITH 25 GAUGE, endolaser, right eye;  Surgeon: Milus Height, MD;  Location: [facility];  Service: Ophthalmology;  Laterality: Right;  fluid pack lot # [id number] H endolaser: 200w, 1439p   PERCUTANEOUS STENT INTERVENTION     right leg   PERIPHERAL VASCULAR BALLOON ANGIOPLASTY Left 03/20/2017   Procedure: PERIPHERAL VASCULAR BALLOON ANGIOPLASTY;  Surgeon: Katha Cabal, MD;  Location: [place] [facility];   Service: Cardiovascular;  Laterality: Left;   Kaser CATHETER INSERTION  2013   POLYPECTOMY  06/18/2019   Procedure: POLYPECTOMY;  Surgeon: Mansouraty, Telford Nab., MD;  Location: [place];  Service: Gastroenterology;;   SUBMUCOSAL LIFTING INJECTION  06/18/2019   Procedure: SUBMUCOSAL LIFTING INJECTION;  Surgeon: Irving Copas., MD;  Location: [place];  Service: Gastroenterology;;   TOE AMPUTATION     left 5th, right 4th and 5th    Current Medications: Current Meds  Medication Sig   aspirin EC 81 MG tablet Take 1 tablet (81 mg total) by mouth daily.   carvedilol (COREG) 6.25 MG tablet Take 1 tablet (6.25 mg total) by mouth 2 (two) times daily.   Cholecalciferol 25 MCG (1000 UT) tablet Take 1 tablet by mouth daily.   empagliflozin (JARDIANCE) 10 MG TABS tablet Take 1 tablet (10 mg total) by mouth daily before breakfast.   ENTRESTO 24-26 MG TAKE 1 TABLET BY MOUTH TWICE (2) DAILY   ferrous gluconate (FERGON) 324 MG tablet TAKE 1 TABLET BY MOUTH ONCE A DAY   furosemide (LASIX) 20 MG tablet TAKE 1 TABLET BY MOUTH ONCE A DAY. MAY TAKE ONE ADDITIONAL TABLET IF INCREASED SWELLING.  mupirocin ointment (BACTROBAN) 2 % Apply 1 application topically daily.   NOVOLIN 70/30 (70-30) 100 UNIT/ML injection INJECT 35 UNITS SUBCUTANEOUSLY TWICE A DAY AS DIRECTED   potassium chloride (KLOR-CON) 10 MEQ tablet TAKE 1 TABLET BY MOUTH ONCE A DAY   rosuvastatin (CRESTOR) 20 MG tablet TAKE 1 TABLET BY MOUTH ONCE A DAY   sertraline (ZOLOFT) 100 MG tablet TAKE 1 TABLET BY MOUTH ONCE DAILY   spironolactone (ALDACTONE) 25 MG tablet Take 0.5 tablets (12.5 mg total) by mouth daily.   triamcinolone cream (KENALOG) 0.1 % Apply 1 application topically 2 (two) times daily as needed.   TRUEPLUS INSULIN SYRINGE 31G X 5/16" 0.5 ML MISC USE AS DIRECTED TWICE DAILY   XARELTO 10 MG TABS tablet TAKE 1 TABLET BY MOUTH ONCE A DAY     Allergies:   Losartan and Lisinopril   Social History    Socioeconomic History   Marital status: Divorced    Spouse name: Not on file   Number of children: 0   Years of education: Not on file   Highest education level: Not on file  Occupational History   Occupation: Licensed conveyancer    Comment: Disabled since 2000's  Tobacco Use   Smoking status: Never    Passive exposure: Past   Smokeless tobacco: Never  Vaping Use   Vaping Use: Never used  Substance and Sexual Activity   Alcohol use: No    Alcohol/week: 0.0 standard drinks of alcohol   Drug use: No   Sexual activity: Not on file  Other Topics Concern   Not on file  Social History Narrative   Has living will   Jeannetta Nap, sister, as health care POA.   Would accept resuscitation attempts   Not sure about tube feeds   Social Determinants of Health   Financial Resource Strain: Not on file  Food Insecurity: Not on file  Transportation Needs: Not on file  Physical Activity: Not on file  Stress: Not on file  Social Connections: Not on file     Family History: The patient's family history includes CAD in his brother; Heart disease in his sister; Hyperlipidemia in his brother. There is no history of Colon cancer, Esophageal cancer, Inflammatory bowel disease, Liver disease, Pancreatic cancer, Rectal cancer, Stomach cancer, or Colon polyps.  ROS:   Please see the history of present illness.     All other systems reviewed and are negative.  EKGs/Labs/Other Studies Reviewed:    The following studies were reviewed today:  Echo 07/19/21  1. Left ventricular ejection fraction, by estimation, is 35 to 40%. The  left ventricle has moderately decreased function. The left ventricle  demonstrates regional wall motion abnormalities (hypokinesis of the  anterior/anteroseptal and apical region)  Moderately dilated LV. There is mild left ventricular hypertrophy. Left  ventricular diastolic parameters are consistent with Grade I diastolic  dysfunction (impaired relaxation).   2. Right  ventricular systolic function is normal. The right ventricular  size is normal.   3. Left atrial size was moderately dilated.   4. The mitral valve is normal in structure. Trivial mitral valve  regurgitation. No evidence of mitral stenosis.   5. The aortic valve was not well visualized. Aortic valve regurgitation  is not visualized. Aortic valve sclerosis is present, with no evidence of  aortic valve stenosis.   6. The inferior vena cava is normal in size with greater than 50%  respiratory variability, suggesting right atrial pressure of 3 mmHg.   Comparison(s): EF 55%.  Myoview lexiscan 07/28/21   Study is high risk due to EF <35%.   There is fixed LV perfusion defect in the mid to apical anterior and anteroseptal walls, with some peri-infarct ischemia   Findings are consistent with prior myocardial infarction with mild peri-infarct ischemia.   There is moderate to severely reduced LV systolic function, LVEF = 32%   There is coronary artery calcifications involving the LAD, RCA LCx    EKG:  EKG is not ordered today.  The ekg ordered today demonstrates   Recent Labs: 09/29/2021: ALT 27; BUN 24; Creatinine, Ser 1.18; Hemoglobin 13.6; Platelets 225; Potassium 3.9; Sodium 138  Recent Lipid Panel    Component Value Date/Time   CHOL 120 04/22/2021 1325   TRIG 69.0 04/22/2021 1325   HDL 35.30 (L) 04/22/2021 1325   CHOLHDL 3 04/22/2021 1325   VLDL 13.8 04/22/2021 1325   LDLCALC 71 04/22/2021 1325   Physical Exam:    VS:  BP 118/88 (BP Location: Right Arm, Patient Position: Sitting, Cuff Size: Large)   Pulse 79   Ht '5\' 9"'$  (1.753 m)   Wt 256 lb 9.6 oz (116.4 kg)   SpO2 98%   BMI 37.89 kg/m     Wt Readings from Last 3 Encounters:  12/16/21 256 lb 9.6 oz (116.4 kg)  10/13/21 254 lb (115.2 kg)  09/29/21 260 lb (117.9 kg)     GEN:  Well nourished, well developed in no acute distress HEENT: Normal NECK: No JVD; No carotid bruits LYMPHATICS: No lymphadenopathy CARDIAC: RRR, no  murmurs, rubs, gallops RESPIRATORY:  Clear to auscultation without rales, wheezing or rhonchi  ABDOMEN: Soft, non-tender, non-distended MUSCULOSKELETAL:  No edema; No deformity  SKIN: Warm and dry NEUROLOGIC:  Alert and oriented x 3 PSYCHIATRIC:  Normal affect   ASSESSMENT:    1. Ischemic cardiomyopathy   2. Chronic systolic heart failure ([place])   3. Coronary artery disease involving native coronary artery of native heart without angina pectoris   4. Essential hypertension    PLAN:    In order of problems listed above:  HFrEF Echo 06/2021 showed LVEF 35-40%. Patient is not wanting to pursue cardiac cath. The patient has trace lower leg edema on exam. He is taking lasix '20mg'$  daily, Coreg 6.'25mg'$  BID and Entresto 24-'26mg'$ BID. I will add spironolactone 12.'5mg'$  daily. BMET in 1-2 weeks. I order a repeat limited echo to evaluate EF.   CAD s/p CABG Lexiscan stress test showed prior MI with mild peri-infarct ischemia. He denies chest pain. No plan for LHC as above. Continue Aspirin, Coreg, and Crestor.   HTN BP is good today. Continue current medications. Add spironolactone as above.   Disposition: Follow up in 1 month(s) with MD/APP      Signed, Herberto Ledwell Ninfa Meeker, PA-C  12/19/2021 8:19 PM    [facility]

## 2021-12-16 NOTE — Patient Instructions (Signed)
Medication Instructions:   START Spironolactone 25 mg--take 1/2 tablet (12.5 mg) daily  *If you need a refill on your cardiac medications before your next appointment, please call your pharmacy*   Lab Work: Your physician recommends that you return for lab work in 1-2 weeks: BMET  - Please go to the [facility]. You will check in at the front desk to the right as you walk into the atrium. Valet Parking is offered if needed. - No appointment needed. You may go any day between 7 am and 6 pm.  If you have labs (blood work) drawn today and your tests are completely normal, you will receive your results only by: [place] (if you have MyChart) OR A paper copy in the mail If you have any lab test that is abnormal or we need to change your treatment, we will call you to review the results.   Testing/Procedures: Your physician has requested that you have a Limited echocardiogram. Echocardiography is a painless test that uses sound waves to create images of your heart. It provides your doctor with information about the size and shape of your heart and how well your heart's chambers and valves are working. This procedure takes approximately one hour. There are no restrictions for this procedure. Please do NOT wear cologne, perfume, aftershave, or lotions (deodorant is allowed). Please arrive 15 minutes prior to your appointment time.    Follow-Up: At [facility], you and your health needs are our priority.  As part of our continuing mission to provide you with exceptional heart care, we have created designated Provider Care Teams.  These Care Teams include your primary Cardiologist (physician) and Advanced Practice Providers (APPs -  Physician Assistants and Nurse Practitioners) who all work together to provide you with the care you need, when you need it.  We recommend signing up for the patient portal called "MyChart".  Sign up information is provided on this After Visit  Summary.  MyChart is used to connect with patients for Virtual Visits (Telemedicine).  Patients are able to view lab/test results, encounter notes, upcoming appointments, etc.  Non-urgent messages can be sent to your provider as well.   To learn more about what you can do with MyChart, go to [email or web address].    Your next appointment:   1 month(s)  The format for your next appointment:   In Person  Provider:   Cadence Kathlen Mody, PA-C    Other Instructions   Important Information About Sugar

## 2022-01-09 ENCOUNTER — Telehealth: Payer: Self-pay | Admitting: Cardiovascular Disease

## 2022-01-09 NOTE — Telephone Encounter (Signed)
Left message to call back  

## 2022-01-09 NOTE — Telephone Encounter (Signed)
Pt c/o medication issue:  1. Name of Medication:   spironolactone (ALDACTONE) 25 MG tablet    2. How are you currently taking this medication (dosage and times per day)? Take 0.5 tablets (12.5 mg total) by mouth daily. - Oral   3. Are you having a reaction (difficulty breathing--STAT)?   4. What is your medication issue? Pt calling because he stopped taking this medication because he made him weak, please advise

## 2022-01-10 ENCOUNTER — Ambulatory Visit: Payer: Medicare HMO

## 2022-01-10 NOTE — Telephone Encounter (Signed)
Patient was returning phone call about medication

## 2022-01-11 NOTE — Telephone Encounter (Signed)
Left a message for the patient to call back. He does have a follow up on 12/19

## 2022-01-11 NOTE — Telephone Encounter (Signed)
Pt returning nurse's call. Please advise

## 2022-01-11 NOTE — Telephone Encounter (Addendum)
Pt called to report he stopped taking spironolactone due to feeling weak.  He also reported he had to use his cane more due to weakness. Pt stated symptoms has since resolved No vitals to report Pt denies lower extremity swelling.  Will forward to PA for recommendations.

## 2022-01-12 ENCOUNTER — Encounter: Payer: Self-pay | Admitting: Internal Medicine

## 2022-01-12 ENCOUNTER — Ambulatory Visit (INDEPENDENT_AMBULATORY_CARE_PROVIDER_SITE_OTHER): Payer: Medicare HMO | Admitting: Internal Medicine

## 2022-01-12 VITALS — BP 112/74 | HR 76 | Temp 97.7°F | Ht 69.0 in | Wt 253.8 lb

## 2022-01-12 DIAGNOSIS — I82409 Acute embolism and thrombosis of unspecified deep veins of unspecified lower extremity: Secondary | ICD-10-CM

## 2022-01-12 DIAGNOSIS — I5022 Chronic systolic (congestive) heart failure: Secondary | ICD-10-CM

## 2022-01-12 DIAGNOSIS — Z23 Encounter for immunization: Secondary | ICD-10-CM

## 2022-01-12 DIAGNOSIS — E1142 Type 2 diabetes mellitus with diabetic polyneuropathy: Secondary | ICD-10-CM | POA: Diagnosis not present

## 2022-01-12 DIAGNOSIS — D329 Benign neoplasm of meninges, unspecified: Secondary | ICD-10-CM | POA: Diagnosis not present

## 2022-01-12 LAB — POCT GLYCOSYLATED HEMOGLOBIN (HGB A1C): Hemoglobin A1C: 6.6 % — AB (ref 4.0–5.6)

## 2022-01-12 NOTE — Assessment & Plan Note (Addendum)
Seems to still have good control with insulin 70/30 bid and jardiance 10 Will check A1c  Lab Results  Component Value Date   HGBA1C 6.6 (A) 01/12/2022   Still good control No changes needed

## 2022-01-12 NOTE — Progress Notes (Signed)
Subjective:    Patient ID: Benjamin Davidson, male    DOB: 1950/09/08, 71 y.o.   MRN: [id number]  HPI Here for follow up  Ribs are all healed Done with all therapy Back to normal activity  Reviewed meningioma No symptoms--will need follow up  Carotid ultrasound was okay--no sig stenosis  Sugars have been good Checks twice good Takes the 70/30 --- 35 units bid  No chest pain Breathing is okay--but DOE with housework. Just stops till better No edema Sleeping okay--in bed (flat)----no PND  Current Outpatient Medications on File Prior to Visit  Medication Sig Dispense Refill   aspirin EC 81 MG tablet Take 1 tablet (81 mg total) by mouth daily. 150 tablet 2   carvedilol (COREG) 6.25 MG tablet Take 1 tablet (6.25 mg total) by mouth 2 (two) times daily. 180 tablet 3   Cholecalciferol 25 MCG (1000 UT) tablet Take 1 tablet by mouth daily.     empagliflozin (JARDIANCE) 10 MG TABS tablet Take 1 tablet (10 mg total) by mouth daily before breakfast. 30 tablet 6   ENTRESTO 24-26 MG TAKE 1 TABLET BY MOUTH TWICE (2) DAILY 60 tablet 6   ferrous gluconate (FERGON) 324 MG tablet TAKE 1 TABLET BY MOUTH ONCE A DAY 30 tablet 0   furosemide (LASIX) 20 MG tablet TAKE 1 TABLET BY MOUTH ONCE A DAY. MAY TAKE ONE ADDITIONAL TABLET IF INCREASED SWELLING. 180 tablet 3   mupirocin ointment (BACTROBAN) 2 % Apply 1 application topically daily. 22 g 1   NOVOLIN 70/30 (70-30) 100 UNIT/ML injection INJECT 35 UNITS SUBCUTANEOUSLY TWICE A DAY AS DIRECTED 20 mL 11   potassium chloride (KLOR-CON) 10 MEQ tablet TAKE 1 TABLET BY MOUTH ONCE A DAY 90 tablet 0   rosuvastatin (CRESTOR) 20 MG tablet TAKE 1 TABLET BY MOUTH ONCE A DAY 90 tablet 3   sertraline (ZOLOFT) 100 MG tablet TAKE 1 TABLET BY MOUTH ONCE DAILY 90 tablet 3   triamcinolone cream (KENALOG) 0.1 % Apply 1 application topically 2 (two) times daily as needed. 45 g 1   TRUEPLUS INSULIN SYRINGE 31G X 5/16" 0.5 ML MISC USE AS DIRECTED TWICE DAILY 100 each 12    XARELTO 10 MG TABS tablet TAKE 1 TABLET BY MOUTH ONCE A DAY 30 tablet 11   No current facility-administered medications on file prior to visit.    Allergies  Allergen Reactions   Losartan Other (See Comments)    Not an allergy -  Not an allergy -   Flu-like symptoms   Lisinopril     Cough Flu like symptoms     Past Medical History:  Diagnosis Date   Anemia    hx of   Arthritis    Asthma    childhood   Cataract    CHF (congestive heart failure) (HCC)    SHOB at times   Coronary artery disease    Diabetic peripheral neuropathy associated with type 2 diabetes mellitus ([place])    DM (diabetes mellitus), type 2, uncontrolled, periph vascular complic    Type II   Edema    feet/legs   Hx of colonic polyps    Hypertension    Kidney stones    Left leg DVT ([place])    2000s   MDD (recurrent major depressive disorder) in remission ([place])    on meds   Mitral regurgitation 08/10/2011   Peripheral vascular disease ([place])    Left toe amputations. Dr Lucky Cowboy   Pulmonary embolism ([place]) 09/05/2005  Pure hypercholesterolemia    Shortness of breath dyspnea    Syncope    resolved   Wears glasses     Past Surgical History:  Procedure Laterality Date   AMPUTATION TOE Left 03/17/2017   Procedure: FIRST RAY RESECTION LEFT FOOT;  Surgeon: Sharlotte Alamo, DPM;  Location: [facility];  Service: Podiatry;  Laterality: Left;   BIOPSY  06/18/2019   Procedure: BIOPSY;  Surgeon: Rush Landmark Telford Nab., MD;  Location: [place];  Service: Gastroenterology;;   CARDIAC CATHETERIZATION     CATARACT EXTRACTION W/PHACO Left 06/03/2015   Procedure: CATARACT EXTRACTION PHACO AND INTRAOCULAR LENS PLACEMENT ([place]);  Surgeon: Eulogio Bear, MD;  Location: [facility];  Service: Ophthalmology;  Laterality: Left;  Lot# [id number] H [place]: 1.06 AP%: 5.5 CDE: 3.69   CATARACT EXTRACTION W/PHACO Right 12/10/2017   Procedure: Aborted phacoemusification of the right eye.  No intraocular lens was implated.;  Surgeon: Eulogio Bear, MD;  Location: [place];  Service: Ophthalmology;  Laterality: Right;  Diabetic - insulin   COLONOSCOPY WITH PROPOFOL N/A 06/18/2019   Procedure: COLONOSCOPY WITH PROPOFOL;  Surgeon: Rush Landmark Telford Nab., MD;  Location: [place];  Service: Gastroenterology;  Laterality: N/A;   CORONARY ARTERY BYPASS GRAFT  2008   ENDOSCOPIC MUCOSAL RESECTION N/A 06/18/2019   Procedure: ENDOSCOPIC MUCOSAL RESECTION;  Surgeon: Rush Landmark Telford Nab., MD;  Location: [place];  Service: Gastroenterology;  Laterality: N/A;   ESOPHAGOGASTRODUODENOSCOPY (EGD) WITH PROPOFOL N/A 06/18/2019   Procedure: ESOPHAGOGASTRODUODENOSCOPY (EGD) WITH PROPOFOL;  Surgeon: Rush Landmark Telford Nab., MD;  Location: [place];  Service: Gastroenterology;  Laterality: N/A;   EYE SURGERY     HEMOSTASIS CLIP PLACEMENT  06/18/2019   Procedure: HEMOSTASIS CLIP PLACEMENT;  Surgeon: Irving Copas., MD;  [facility]: [facility];  Service: Gastroenterology;;   PARS PLANA VITRECTOMY Right 02/17/2015   Procedure: PARS PLANA VITRECTOMY WITH 25 GAUGE, endolaser, right eye;  Surgeon: Milus Height, MD;  Location: [facility];  Service: Ophthalmology;  Laterality: Right;  fluid pack lot # [id number] H endolaser: 200w, 1439p   PERCUTANEOUS STENT INTERVENTION     right leg   PERIPHERAL VASCULAR BALLOON ANGIOPLASTY Left 03/20/2017   Procedure: PERIPHERAL VASCULAR BALLOON ANGIOPLASTY;  Surgeon: Katha Cabal, MD;  Location: [place] CV LAB;  Service: Cardiovascular;  Laterality: Left;   [place] CATHETER INSERTION  2013   POLYPECTOMY  06/18/2019   Procedure: POLYPECTOMY;  Surgeon: Mansouraty, Telford Nab., MD;  Location: [place];  Service: Gastroenterology;;   SUBMUCOSAL LIFTING INJECTION  06/18/2019   Procedure: SUBMUCOSAL LIFTING INJECTION;  Surgeon: Irving Copas., MD;  Location: [place];  Service: Gastroenterology;;   TOE AMPUTATION     left 5th, right 4th  and 5th    Family History  Problem Relation Age of Onset   Heart disease Sister    CAD Brother    Hyperlipidemia Brother    Colon cancer Neg Hx    Esophageal cancer Neg Hx    Inflammatory bowel disease Neg Hx    Liver disease Neg Hx    Pancreatic cancer Neg Hx    Rectal cancer Neg Hx    Stomach cancer Neg Hx    Colon polyps Neg Hx     Social History   Socioeconomic History   Marital status: Divorced    Spouse name: Not on file   Number of children: 0   Years of education: Not on file   Highest education level: Not on file  Occupational History   Occupation: [name]  Comment: Disabled since 2000's  Tobacco Use   Smoking status: Never    Passive exposure: Past   Smokeless tobacco: Never  Vaping Use   Vaping Use: Never used  Substance and Sexual Activity   Alcohol use: No    Alcohol/week: 0.0 standard drinks of alcohol   Drug use: No   Sexual activity: Not on file  Other Topics Concern   Not on file  Social History Narrative   Has living will   Jeannetta Nap, sister, as health care POA.   Would accept resuscitation attempts   Not sure about tube feeds   Social Determinants of Health   Financial Resource Strain: Not on file  Food Insecurity: Not on file  Transportation Needs: Not on file  Physical Activity: Not on file  Stress: Not on file  Social Connections: Not on file  Intimate Partner Violence: Not on file   Review of Systems Weight is down slightly Bowels move fine    Objective:   Physical Exam Constitutional:      Appearance: Normal appearance.  Cardiovascular:     Rate and Rhythm: Normal rate and regular rhythm.     Heart sounds: No murmur heard.    No gallop.     Comments: Feet warm Faint pulse on right , absent left Pulmonary:     Effort: Pulmonary effort is normal.     Breath sounds: Normal breath sounds. No wheezing or rales.  Musculoskeletal:     Cervical back: Neck supple.     Right lower leg: No edema.     Left lower leg:  No edema.  Lymphadenopathy:     Cervical: No cervical adenopathy.  Skin:    Findings: No lesion or rash.  Neurological:     Mental Status: He is alert.            Assessment & Plan:

## 2022-01-12 NOTE — Assessment & Plan Note (Signed)
Compensated on entresto 24/26 bid, jardiance 10, carvedilol 6.25 bid and furosemide 20

## 2022-01-12 NOTE — Assessment & Plan Note (Signed)
Will plan to recheck MRI in 6 months or so to assure stability

## 2022-01-12 NOTE — Assessment & Plan Note (Signed)
No signs of recurrence on the xarelto '10mg'$  daily

## 2022-01-16 NOTE — Progress Notes (Unsigned)
Date:  01/17/2022   ID:  Benjamin Davidson, DOB Nov 16, 1950, MRN [id number]  Patient Location:  [address] [place] [address]   Provider location:   [facility], [place] office  PCP:  Venia Carbon, MD  Cardiologist:  Arvid Right [facility]  Chief Complaint  Patient presents with   1 month follow up    Discuss Echo results. Patient c/o shortness of breath. Medications reviewed by the patient verbally.     History of Present Illness:    Benjamin Davidson is a 71 y.o. 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,   diabetes,  A1C 7 sedentary lifestyle,   history of DVT in the left leg,  on xarelto who presents for routine followup of his coronary artery disease, peripheral arterial disease  Last seen by myself in the office May 2023 Echocardiogram June 2023  drop in ejection fraction 35 to 40% This compares to study 10 years ago which was normal 55%  Stress test July 28, 2021 Showed drop in ejection fraction, prior MI with peri-infarct ischemia Given the drop in ejection fraction noted here and echocardiogram, prior history of CABG 2008 there is  concern for ischemic disease  Results above were reviewed with the patient in clinic July 2023 On that office visit he reported persistent shortness of breath and leg swelling He declined cardiac catheterization at that time, preferred medical management He was started on Jardiance 10 mg daily  Current medications taking lasix '20mg'$  daily, Coreg 6.'25mg'$  BID and Entresto 24-'26mg'$ BID.   Side effects on spironolactone (added 11/23 office visit)  balance problem, stopped it  Work reviewed A1c 6.6, trending down Cholesterol at goal  Sedentary, no regular exercise program  EKG personally reviewed by myself on todays visit Normal sinus rhythm rate 73 bpm, PACs, left bundle branch block  Other past medical history reviewed  hospital admission  February 2019 Left great toe osteomyelitis with ulceration Amputation left great toe,  blister that never healed   Vascular surgery March 20, 2017 Angioplasty of left posterior tibial Angioplasty left peroneal artery  History of osteomyelitis requiring wound VAC on his right foot, 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 He had gangrene of the fifth toe on the right with acute renal failure and dehydration admitted 08/07/2011  last stress test July 2008 showing mild ischemia in the mid anteroseptal, apical anterior and apical septal region.   Past Medical History:  Diagnosis Date   Anemia    hx of   Arthritis    Asthma    childhood   Cataract    CHF (congestive heart failure) ([place])    SHOB at times   Coronary artery disease    Diabetic peripheral neuropathy associated with type 2 diabetes mellitus (HCC)    DM (diabetes mellitus), type 2, uncontrolled, periph vascular complic    Type II   Edema    feet/legs   Hx of colonic polyps    Hypertension    Kidney stones    Left leg DVT ([place])    2000s   MDD (recurrent major depressive disorder) in remission ([place])    on meds   Mitral regurgitation 08/10/2011   Peripheral vascular disease ([place])    Left toe amputations. Dr Lucky Cowboy   Pulmonary embolism ([place]) 09/05/2005   Pure hypercholesterolemia    Shortness of breath dyspnea    Syncope  resolved   Wears glasses    Past Surgical History:  Procedure Laterality Date   AMPUTATION TOE Left 03/17/2017   Procedure: FIRST RAY RESECTION LEFT FOOT;  Surgeon: Sharlotte Alamo, DPM;  Location: [facility];  Service: Podiatry;  Laterality: Left;   BIOPSY  06/18/2019   Procedure: BIOPSY;  Surgeon: Rush Landmark Telford Nab., MD;  Location: [place];  Service: Gastroenterology;;   CARDIAC CATHETERIZATION     CATARACT EXTRACTION W/PHACO Left 06/03/2015   Procedure: CATARACT EXTRACTION PHACO AND INTRAOCULAR LENS PLACEMENT ([place]);  Surgeon: Eulogio Bear, MD;  Location:  [facility];  Service: Ophthalmology;  Laterality: Left;  Lot# [id number] H [place]: 1.06 AP%: 5.5 CDE: 3.69   CATARACT EXTRACTION W/PHACO Right 12/10/2017   Procedure: Aborted phacoemusification of the right eye.  No intraocular lens was implated.;  Surgeon: Eulogio Bear, MD;  Location: [place];  Service: Ophthalmology;  Laterality: Right;  Diabetic - insulin   COLONOSCOPY WITH PROPOFOL N/A 06/18/2019   Procedure: COLONOSCOPY WITH PROPOFOL;  Surgeon: Rush Landmark Telford Nab., MD;  Location: [place];  Service: Gastroenterology;  Laterality: N/A;   CORONARY ARTERY BYPASS GRAFT  2008   ENDOSCOPIC MUCOSAL RESECTION N/A 06/18/2019   Procedure: ENDOSCOPIC MUCOSAL RESECTION;  Surgeon: Rush Landmark Telford Nab., MD;  Location: [place];  Service: Gastroenterology;  Laterality: N/A;   ESOPHAGOGASTRODUODENOSCOPY (EGD) WITH PROPOFOL N/A 06/18/2019   Procedure: ESOPHAGOGASTRODUODENOSCOPY (EGD) WITH PROPOFOL;  Surgeon: Rush Landmark Telford Nab., MD;  Location: [place];  Service: Gastroenterology;  Laterality: N/A;   EYE SURGERY     HEMOSTASIS CLIP PLACEMENT  06/18/2019   Procedure: HEMOSTASIS CLIP PLACEMENT;  Surgeon: Irving Copas., MD;  Location: [facility] ENDOSCOPY;  Service: Gastroenterology;;   PARS PLANA VITRECTOMY Right 02/17/2015   Procedure: PARS PLANA VITRECTOMY WITH 25 GAUGE, endolaser, right eye;  Surgeon: Milus Height, MD;  Location: [facility];  Service: Ophthalmology;  Laterality: Right;  fluid pack lot # [id number] H endolaser: 200w, 1439p   PERCUTANEOUS STENT INTERVENTION     right leg   PERIPHERAL VASCULAR BALLOON ANGIOPLASTY Left 03/20/2017   Procedure: PERIPHERAL VASCULAR BALLOON ANGIOPLASTY;  Surgeon: Katha Cabal, MD;  Location: [place] [facility];  Service: Cardiovascular;  Laterality: Left;   [place] CATHETER INSERTION  2013   POLYPECTOMY  06/18/2019   Procedure: POLYPECTOMY;  Surgeon: Mansouraty, Telford Nab., MD;  Location: [place];  Service: Gastroenterology;;   SUBMUCOSAL LIFTING INJECTION  06/18/2019   Procedure: SUBMUCOSAL LIFTING INJECTION;  Surgeon: Irving Copas., MD;  Location: [place];  Service: Gastroenterology;;   TOE AMPUTATION     left 5th, right 4th and 5th    Current Outpatient Medications on File Prior to Visit  Medication Sig Dispense Refill   aspirin EC 81 MG tablet Take 1 tablet (81 mg total) by mouth daily. 150 tablet 2   carvedilol (COREG) 6.25 MG tablet Take 1 tablet (6.25 mg total) by mouth 2 (two) times daily. 180 tablet 3   Cholecalciferol 25 MCG (1000 UT) tablet Take 1 tablet by mouth daily.     empagliflozin (JARDIANCE) 10 MG TABS tablet Take 1 tablet (10 mg total) by mouth daily before breakfast. 30 tablet 6   ENTRESTO 24-26 MG TAKE 1 TABLET BY MOUTH TWICE (2) DAILY 60 tablet 6   ferrous gluconate (FERGON) 324 MG tablet TAKE 1 TABLET BY MOUTH ONCE A DAY 30 tablet 0   furosemide (LASIX) 20 MG tablet TAKE 1 TABLET BY MOUTH ONCE A DAY. MAY TAKE ONE ADDITIONAL TABLET IF INCREASED SWELLING.  180 tablet 3   mupirocin ointment (BACTROBAN) 2 % Apply 1 application topically daily. 22 g 1   NOVOLIN 70/30 (70-30) 100 UNIT/ML injection INJECT 35 UNITS SUBCUTANEOUSLY TWICE A DAY AS DIRECTED 20 mL 11   potassium chloride (KLOR-CON) 10 MEQ tablet TAKE 1 TABLET BY MOUTH ONCE A DAY 90 tablet 0   rosuvastatin (CRESTOR) 20 MG tablet TAKE 1 TABLET BY MOUTH ONCE A DAY 90 tablet 3   sertraline (ZOLOFT) 100 MG tablet TAKE 1 TABLET BY MOUTH ONCE DAILY 90 tablet 3   triamcinolone cream (KENALOG) 0.1 % Apply 1 application topically 2 (two) times daily as needed. 45 g 1   XARELTO 10 MG TABS tablet TAKE 1 TABLET BY MOUTH ONCE A DAY 30 tablet 11   TRUEPLUS INSULIN SYRINGE 31G X 5/16" 0.5 ML MISC USE AS DIRECTED TWICE DAILY (Patient not taking: Reported on 01/17/2022) 100 each 12   No current facility-administered medications on file prior to visit.     Allergies:   Losartan and Lisinopril    Social History   Tobacco Use   Smoking status: Never    Passive exposure: Past   Smokeless tobacco: Never  Vaping Use   Vaping Use: Never used  Substance Use Topics   Alcohol use: No    Alcohol/week: 0.0 standard drinks of alcohol   Drug use: No    Family Hx: The patient's family history includes CAD in his brother; Heart disease in his sister; Hyperlipidemia in his brother. There is no history of Colon cancer, Esophageal cancer, Inflammatory bowel disease, Liver disease, Pancreatic cancer, Rectal cancer, Stomach cancer, or Colon polyps.  ROS:   Please see the history of present illness.    Review of Systems  Constitutional: Negative.   HENT: Negative.    Respiratory:  Positive for shortness of breath.   Cardiovascular: Negative.   Gastrointestinal: Negative.   Musculoskeletal: Negative.   Neurological: Negative.   Psychiatric/Behavioral: Negative.    All other systems reviewed and are negative.    Labs/Other Tests and Data Reviewed:    Recent Labs: 09/29/2021: ALT 27; BUN 24; Creatinine, Ser 1.18; Hemoglobin 13.6; Platelets 225; Potassium 3.9; Sodium 138   Recent Lipid Panel Lab Results  Component Value Date/Time   CHOL 120 04/22/2021 01:25 PM   TRIG 69.0 04/22/2021 01:25 PM   HDL 35.30 (L) 04/22/2021 01:25 PM   CHOLHDL 3 04/22/2021 01:25 PM   LDLCALC 71 04/22/2021 01:25 PM    Wt Readings from Last 3 Encounters:  01/17/22 262 lb 6 oz (119 kg)  01/12/22 253 lb 12.8 oz (115.1 kg)  12/16/21 256 lb 9.6 oz (116.4 kg)     Exam:    BP 130/70 (BP Location: Left Arm, Patient Position: Sitting, Cuff Size: Large)   Ht '5\' 9"'$  (1.753 m)   Wt 262 lb 6 oz (119 kg)   SpO2 97%   BMI 38.75 kg/m  Constitutional:  oriented to person, place, and time. No distress.  HENT:  Head: Grossly normal Eyes:  no discharge. No scleral icterus.  Neck: No JVD, no carotid bruits  Cardiovascular: Regular rate and rhythm, no murmurs appreciated Pulmonary/Chest: Clear to auscultation  bilaterally, no wheezes or rails Abdominal: Soft.  no distension.  no tenderness.  Musculoskeletal: Normal range of motion Neurological:  normal muscle tone. Coordination normal. No atrophy Skin: Skin warm and dry Psychiatric: normal affect, pleasant   ASSESSMENT & PLAN:    PAD (peripheral artery disease) (HCC) Previously followed by vascular, dr. Ronalee Belts Denies claudication  symptoms, diabetes numbers improving, cholesterol at goal Recommended weight loss program, exercise  Morbid obesity (HCC) Weight continues to run high Could consider injection medications with Ozempic/Mounjaro  Type 2 diabetes, controlled, with peripheral neuropathy (HCC) A1c in the 6 range Recommended low carb diet, walking program  Cardiomyopathy History of coronary disease, CABG, declining cardiac catheterization We have recommended he increase Entresto up to 49/51 mg twice daily, continue carvedilol, Jardiance, Lasix He did not tolerate spironolactone secondary to gait instability Scheduled for limited echo to evaluate ejection fraction at the end of January on optimal medical management  Pure hypercholesterolemia Cholesterol is at goal on the current lipid regimen. No changes to the medications were made.  Essential hypertension Blood pressure is well controlled on today's visit. No changes made to the medications.  Atherosclerosis of native coronary artery of native heart with stable angina pectoris ([place]) Drop in ejection fraction concerning for underlying ischemia, he is declining cardiac catheterization again today Medical management as above  Shortness of breath Reports longstanding issue, likely from deconditioning, obesity, low ejection fraction Unable to exclude underlying ischemia   Total encounter time more than 30 minutes  Greater than 50% was spent in counseling and coordination of care with the patient   Signed, Ida Rogue, MD  01/17/2022 11:23 AM    [facility] [place] Office [address], [place], [place] [address]

## 2022-01-17 ENCOUNTER — Ambulatory Visit: Payer: Medicare HMO | Attending: Cardiovascular Disease | Admitting: Cardiovascular Disease

## 2022-01-17 ENCOUNTER — Encounter: Payer: Self-pay | Admitting: Cardiovascular Disease

## 2022-01-17 VITALS — BP 130/70 | HR 73 | Ht 69.0 in | Wt 262.4 lb

## 2022-01-17 DIAGNOSIS — I5020 Unspecified systolic (congestive) heart failure: Secondary | ICD-10-CM

## 2022-01-17 DIAGNOSIS — I10 Essential (primary) hypertension: Secondary | ICD-10-CM

## 2022-01-17 DIAGNOSIS — I25119 Atherosclerotic heart disease of native coronary artery with unspecified angina pectoris: Secondary | ICD-10-CM

## 2022-01-17 DIAGNOSIS — E1142 Type 2 diabetes mellitus with diabetic polyneuropathy: Secondary | ICD-10-CM

## 2022-01-17 DIAGNOSIS — N183 Chronic kidney disease, stage 3 unspecified: Secondary | ICD-10-CM | POA: Diagnosis not present

## 2022-01-17 DIAGNOSIS — I255 Ischemic cardiomyopathy: Secondary | ICD-10-CM

## 2022-01-17 DIAGNOSIS — E1122 Type 2 diabetes mellitus with diabetic chronic kidney disease: Secondary | ICD-10-CM

## 2022-01-17 DIAGNOSIS — E7800 Pure hypercholesterolemia, unspecified: Secondary | ICD-10-CM

## 2022-01-17 DIAGNOSIS — I2510 Atherosclerotic heart disease of native coronary artery without angina pectoris: Secondary | ICD-10-CM | POA: Diagnosis not present

## 2022-01-17 DIAGNOSIS — I739 Peripheral vascular disease, unspecified: Secondary | ICD-10-CM | POA: Diagnosis not present

## 2022-01-17 DIAGNOSIS — I5022 Chronic systolic (congestive) heart failure: Secondary | ICD-10-CM | POA: Diagnosis not present

## 2022-01-17 MED ORDER — ENTRESTO 49-51 MG PO TABS: 1.0000 | ORAL_TABLET | Freq: Two times a day (BID) | ORAL | 3 refills | Status: DC

## 2022-01-17 MED ORDER — CARVEDILOL 6.25 MG PO TABS: 6.2500 mg | ORAL_TABLET | Freq: Two times a day (BID) | ORAL | 3 refills | Status: DC

## 2022-01-17 MED ORDER — FUROSEMIDE 20 MG PO TABS: ORAL_TABLET | ORAL | 3 refills | Status: DC

## 2022-01-17 MED ORDER — EMPAGLIFLOZIN 10 MG PO TABS: 10.0000 mg | ORAL_TABLET | Freq: Every day | ORAL | 6 refills | Status: DC

## 2022-01-17 MED ORDER — POTASSIUM CHLORIDE ER 10 MEQ PO TBCR: 10.0000 meq | EXTENDED_RELEASE_TABLET | Freq: Every day | ORAL | 3 refills | Status: DC

## 2022-01-17 NOTE — Patient Instructions (Addendum)
Medication Instructions:  Your physician has recommended you make the following change in your medication:   Please increase the entresto up to 49/51 mg twice a day  If you need a refill on your cardiac medications before your next appointment, please call your pharmacy.   Lab work: No new labs needed  Testing/Procedures:  You have a future Echo scheduled for 02/27/2021 @ 3:00 pm @ [facility].  Follow-Up: At [facility], you and your health needs are our priority.  As part of our continuing mission to provide you with exceptional heart care, we have created designated Provider Care Teams.  These Care Teams include your primary Cardiologist (physician) and Advanced Practice Providers (APPs -  Physician Assistants and Nurse Practitioners) who all work together to provide you with the care you need, when you need it.  You will need a follow up appointment in 6 months  Providers on your designated Care Team:   Murray Hodgkins, NP Christell Faith, PA-C Cadence Kathlen Mody, [place]  COVID-19 Vaccine Information can be found at: [email or web address] For questions related to vaccine distribution or appointments, please email [email or web address] or call [phone].

## 2022-01-18 NOTE — Telephone Encounter (Signed)
Pt made aware of PA's recommendation  Furth, Cadence H, PA-C  You1 hour ago (8:09 AM)    Ok, that's fine. Thank you for the update. We can discuss at follow-up

## 2022-02-09 DIAGNOSIS — H31093 Other chorioretinal scars, bilateral: Secondary | ICD-10-CM | POA: Diagnosis not present

## 2022-02-09 DIAGNOSIS — E113511 Type 2 diabetes mellitus with proliferative diabetic retinopathy with macular edema, right eye: Secondary | ICD-10-CM | POA: Diagnosis not present

## 2022-02-09 DIAGNOSIS — E113592 Type 2 diabetes mellitus with proliferative diabetic retinopathy without macular edema, left eye: Secondary | ICD-10-CM | POA: Diagnosis not present

## 2022-02-09 DIAGNOSIS — H35033 Hypertensive retinopathy, bilateral: Secondary | ICD-10-CM | POA: Diagnosis not present

## 2022-02-18 DIAGNOSIS — E1142 Type 2 diabetes mellitus with diabetic polyneuropathy: Secondary | ICD-10-CM | POA: Diagnosis not present

## 2022-02-27 ENCOUNTER — Ambulatory Visit: Payer: Medicare HMO | Attending: Medical

## 2022-02-27 DIAGNOSIS — I255 Ischemic cardiomyopathy: Secondary | ICD-10-CM

## 2022-02-27 LAB — ECHOCARDIOGRAM LIMITED
Area-P 1/2: 2.5 cm2
S' Lateral: 3.6 cm
Single Plane A4C EF: 36.2 %

## 2022-02-27 MED ORDER — PERFLUTREN LIPID MICROSPHERE
1.0000 mL | INTRAVENOUS | Status: AC | PRN
  Administered 2022-02-27: 2 mL via INTRAVENOUS

## 2022-03-13 ENCOUNTER — Other Ambulatory Visit: Payer: Self-pay | Admitting: Internal Medicine

## 2022-03-13 DIAGNOSIS — E1142 Type 2 diabetes mellitus with diabetic polyneuropathy: Secondary | ICD-10-CM

## 2022-03-13 DIAGNOSIS — I5022 Chronic systolic (congestive) heart failure: Secondary | ICD-10-CM

## 2022-03-21 ENCOUNTER — Telehealth: Payer: Self-pay

## 2022-03-21 NOTE — Progress Notes (Signed)
Care Management & Coordination Services Pharmacy Team  Reason for Encounter: Appointment Reminder  Contacted patient to confirm in office appointment with Charlene Brooke , PharmD on 03/23/22 at 2:00. Spoke with patient on 03/21/2022   Do you have any problems getting your medications? No Patient reports good insurance and satisfied with [facility]   What is your top health concern you would like to discuss at your upcoming visit? Patient reports no concerns.Healed up from his fall last year   Have you seen any other providers since your last visit with PCP? Yes- cardiology   Chart review:  Recent office visits:  01/12/22-Richard Letvak,MD(PCP)-f/u DM,(A1c 6.6) no medication changes,f/u 6 months 10/13/21-Richard Letvak,MD(PCP)-hospital f/u,gradual healing rib fractures,PT/OT in home, f/u 3 months   Recent consult visits:  01/17/22-Timothy Gollan,MD(cardio) f/u test results,short of breath,Increase entresto to 49-59m twice daily,f/u 6 months 12/16/21-Cadence Furth,PA(cardio)-lexiscan stress test-f/u 1 month 12/15/21-Ophthalmology- no data  Hospital visits:  09/29/21 thru 10/02/21 - [place],renal hematoma.adjust home insulin to 20/15,discharged with home health,f/u trauma clinic 4 weeks, referral for neurosurgery.  Star Rating Drugs:  Medication:  Last Fill: [employer] Jardiance 135m2/1/24  30 Novolin   02/14/22 29 Rosuvastatin 2056m/31/24 90 Entresto 49-51 03/02/22  30   Care Gaps: Annual wellness visit in last year? Yes  If Diabetic: Last eye exam / retinopathy screening:2023 Last diabetic foot exam:UTD   LinCharlene BrookeharmD notified  VelAvel SensorCMKillbucksistant [phone]

## 2022-03-23 ENCOUNTER — Ambulatory Visit: Payer: Medicare HMO | Admitting: Pharmacist

## 2022-03-23 NOTE — Progress Notes (Signed)
Care Management & Coordination Services Pharmacy Note  03/23/2022 Name:  Benjamin Davidson MRN:  [id number] DOB:  [birth year]  Summary: -Reviewed medications with patient; he reports using pill box to keep track and rarely misses doses -HF: EF 40-45%; discussed relatively new diagnosis - role of medications, importance of self monitoring BP and weight -DM: A1c 6.6% (12/2021), pt denies hypoglycemia; he is checking fasting BG and before dinner, BG range 100-200 -HLD/CAD: LDL 71 (03/2021) adequate; he reports muscle aches occasionally which he attributes to rosuvastatin  Recommendations/Changes made from today's visit: -Advised to monitor BP and weight daily; contact provider with wt gain > 3lbs overnight -Discussed trial of CoQ10 for cramps and B12 for energy  Follow up plan: -Health Concierge will call patient 2 month for HF update -Pharmacist follow up televisit scheduled for 6 months -PCP appt 07/14/22; cardiology appt 07/19/22   Subjective: Md Benjamin Davidson is an 72 y.o. year old male who is a primary patient of Benjamin Carbon, MD.  The care coordination team was consulted for assistance with disease management and care coordination needs.    Engaged with patient face to face for initial visit. Patient lives at home alone. He has 3 sisters and 1 brother all living in [place]. He is divorced without children. He worked on [employer] prior to disability (DVT, CABG in early 2000s). He also had a learning disability as a child (went to a special school), he did graduate high school. He walks with a cane in the office.  03/23/22: Patient is under some physical distress in office today, he reports he has stomach issues and feels that he "ate too much" today. Offered to reschedule appt for when he was feeling better but pt opted to complete visit as he is already here. He did bring his medications to the appt.   Recent office visits: 01/12/22 Dr Silvio Pate OV: f/u - A1c 6.6%; D/C spironolactone (not  taking)  10/13/21 Dr Silvio Pate OV: hospital f/u, gradual healing rib fractures. No changes.  Recent consult visits: 01/17/22 Danella Deis (Cardiology) f/u HF - increase Entresto to 49-51 mg  01/09/22 Cardiology TE: pt stopped spiro d/t weakness 12/16/21 Cadence Furth,PA (Cardiology): f/u HF. Start spironolactone 12.5 mg  12/15/21-Ophthalmology- no data  Hospital visits: 09/29/21 thru 10/02/21 ([facility]): Fall in bathtub, multiple rib fractures, renal hematoma. Incidental finding of meningioma on CT   Objective:  Lab Results  Component Value Date   CREATININE 1.18 09/29/2021   BUN 24 (H) 09/29/2021   GFR 67.12 04/22/2021   GFRNONAA >60 09/29/2021   GFRAA >60 03/20/2017   NA 138 09/29/2021   K 3.9 09/29/2021   CALCIUM 9.1 09/29/2021   CO2 23 09/29/2021   GLUCOSE 197 (H) 09/29/2021    Lab Results  Component Value Date/Time   HGBA1C 6.6 (A) 01/12/2022 12:58 PM   HGBA1C 7.2 (A) 08/31/2021 09:10 AM   HGBA1C 7.0 (H) 04/22/2021 01:25 PM   HGBA1C 6.6 (H) 08/07/2019 03:24 PM   HGBA1C 11.5 (H) 02/24/2011 05:30 AM   GFR 67.12 04/22/2021 01:25 PM   GFR 58.59 (L) 08/07/2019 03:24 PM   MICROALBUR 7.8 (H) 04/22/2021 01:25 PM   MICROALBUR 1.76 01/31/2008 01:08 AM    Last diabetic Eye exam:  Lab Results  Component Value Date/Time   HMDIABEYEEXA Retinopathy (A) 03/10/2021 12:00 AM    Last diabetic Foot exam:  Lab Results  Component Value Date/Time   HMDIABFOOTEX done 04/22/2021 12:00 AM     Lab Results  Component Value  Date   CHOL 120 04/22/2021   HDL 35.30 (L) 04/22/2021   LDLCALC 71 04/22/2021   TRIG 69.0 04/22/2021   CHOLHDL 3 04/22/2021       Latest Ref Rng & Units 09/29/2021    4:31 PM 04/22/2021    1:25 PM 08/07/2019    3:24 PM  Hepatic Function  Total Protein 6.5 - 8.1 g/dL 7.2  6.8  6.7   Albumin 3.5 - 5.0 g/dL 4.1  4.3    4.3  4.3    4.3   AST 15 - 41 U/L '30  22  19   '$ ALT 0 - 44 U/L '27  28  15   '$ Alk Phosphatase 38 - 126 U/L 50  57  62   Total Bilirubin  0.3 - 1.2 mg/dL 1.3  0.6  0.5   Bilirubin, Direct 0.0 - 0.3 mg/dL  0.2  0.1     Lab Results  Component Value Date/Time   TSH 1.629 09/21/2006 12:29 AM   FREET4 0.83 04/22/2021 01:25 PM   FREET4 0.77 12/17/2014 04:07 PM       Latest Ref Rng & Units 09/29/2021    4:31 PM 04/22/2021    1:25 PM 08/07/2019    3:24 PM  CBC  WBC 4.0 - 10.5 K/uL 14.4  7.1  8.0   Hemoglobin 13.0 - 17.0 g/dL 13.6  13.1  12.2   Hematocrit 39.0 - 52.0 % 41.9  39.7  37.0   Platelets 150 - 400 K/uL 225  242.0  306.0    Iron/TIBC/Ferritin/ %Sat    Component Value Date/Time   IRON 78 08/07/2019 1524   FERRITIN 19.0 (L) 08/07/2019 1524   IRONPCTSAT 17.8 (L) 08/07/2019 1524     Lab Results  Component Value Date/Time   VD25OH 21.71 (L) 08/05/2018 03:52 PM    Clinical ASCVD: Yes  The ASCVD Risk score (Arnett DK, et al., 2019) failed to calculate for the following reasons:   The valid total cholesterol range is 130 to 320 mg/dL        01/12/2022   12:05 PM 02/05/2020    3:31 PM  Depression screen PHQ 2/9  Decreased Interest 0 0  Down, Depressed, Hopeless 0 0  PHQ - 2 Score 0 0     Social History   Tobacco Use  Smoking Status Never   Passive exposure: Past  Smokeless Tobacco Never   BP Readings from Last 3 Encounters:  01/17/22 130/70  01/12/22 112/74  12/16/21 118/88   Pulse Readings from Last 3 Encounters:  01/17/22 73  01/12/22 76  12/16/21 79   Wt Readings from Last 3 Encounters:  01/17/22 262 lb 6 oz (119 kg)  01/12/22 253 lb 12.8 oz (115.1 kg)  12/16/21 256 lb 9.6 oz (116.4 kg)   BMI Readings from Last 3 Encounters:  01/17/22 38.75 kg/m  01/12/22 37.48 kg/m  12/16/21 37.89 kg/m    Allergies  Allergen Reactions   Losartan Other (See Comments)    Not an allergy -  Not an allergy -   Flu-like symptoms   Lisinopril     Cough Flu like symptoms     Medications Reviewed Today     Reviewed by Charlton Haws, [facility] (Pharmacist) on 03/24/22 at [place] List  Status: <None>   Medication Order Taking? Sig Documenting Provider Last Dose Status Informant  aspirin EC 81 MG tablet [id number] Yes Take 1 tablet (81 mg total) by mouth daily. Schnier, Benjamin Lory, MD Taking Active  Self  carvedilol (COREG) 6.25 MG tablet [id number] Yes Take 1 tablet (6.25 mg total) by mouth 2 (two) times daily. Minna Merritts, MD Taking Active   Cholecalciferol 25 MCG (1000 UT) tablet [id number] Yes Take 1 tablet by mouth daily. [provider] Taking Active   empagliflozin (JARDIANCE) 10 MG TABS tablet [id number] Yes Take 1 tablet (10 mg total) by mouth daily before breakfast. Minna Merritts, MD Taking Active   ferrous gluconate (FERGON) 324 MG tablet [id number] Yes TAKE 1 TABLET BY MOUTH ONCE A DAY Mansouraty, Telford Nab., MD Taking Active   furosemide (LASIX) 20 MG tablet [id number] Yes TAKE 1 TABLET BY MOUTH ONCE A DAY. MAY TAKE ONE ADDITIONAL TABLET IF INCREASED SWELLING. Minna Merritts, MD Taking Active   mupirocin ointment (BACTROBAN) 2 % [id number] Yes Apply 1 application topically daily. Moye, [place], MD Taking Active   NOVOLIN 70/30 (70-30) 100 UNIT/ML injection [id number] Yes INJECT 35 UNITS SUBCUTANEOUSLY TWICE A DAY AS DIRECTED Benjamin Carbon, MD Taking Active   potassium chloride (KLOR-CON) 10 MEQ tablet [id number] Yes Take 1 tablet (10 mEq total) by mouth daily. Minna Merritts, MD Taking Active   rosuvastatin (CRESTOR) 20 MG tablet [id number] Yes TAKE 1 TABLET BY MOUTH ONCE A DAY Gollan, Kathlene November, MD Taking Active   sacubitril-valsartan (ENTRESTO) 49-51 MG [id number] Yes Take 1 tablet by mouth 2 (two) times daily. Minna Merritts, MD Taking Active   sertraline (ZOLOFT) 100 MG tablet [id number] Yes TAKE 1 TABLET BY MOUTH ONCE DAILY Benjamin Carbon, MD Taking Active   triamcinolone cream (KENALOG) 0.1 % [id number] Yes Apply 1 application topically 2 (two) times daily as needed. Benjamin Carbon, MD Taking Active   TRUEPLUS INSULIN SYRINGE 31G X 5/16"  0.5 ML MISC [id number] Yes USE AS DIRECTED TWICE DAILY Benjamin Carbon, MD Taking Active   XARELTO 10 MG TABS tablet [id number] Yes TAKE 1 TABLET BY MOUTH ONCE A DAY Benjamin Carbon, MD Taking Active             SDOH:  (Social Determinants of Health) assessments and interventions performed: Yes SDOH Interventions    [place] Coordination from 03/23/2022 in [place]  SDOH Interventions   Housing Interventions Intervention Not Indicated  Transportation Interventions Intervention Not Indicated  Financial Strain Interventions Intervention Not Indicated  Social Connections Interventions Other (Comment)  [encouraged going back to church]       Medication Assistance: None required.  Patient affirms current coverage meets needs.  Medication Access: Within the past 30 days, how often has patient missed a dose of medication? 0 Is a pillbox or other method used to improve adherence? Yes  Factors that may affect medication adherence? no barriers identified Are meds synced by current pharmacy? No  Are meds delivered by current pharmacy? No  Does patient experience delays in picking up medications due to transportation concerns? No   [facility] Services Reviewed: Is patient disadvantaged to use [facility]?: No  Current Rx insurance plan: [employer] Name and location of Current pharmacy:  [facility] [place] [address] [place] [address] Phone: [phone] Fax: [phone]  [facility] services reviewed with patient today?: No  Patient requests to transfer care to [facility] Pharmacy?: No  Reason patient declined to change pharmacies: Loyalty to other pharmacy/Patient preference  Compliance/Adherence/Medication fill history: Care Gaps: None  Star-Rating Drugs: Jardiance - PDC 93% Rosuvastatin - PDC 99%   Assessment/Plan  Heart Failure (Goal: BP <  130/80) -Controlled -Current home BP/HR  readings: not checking -Current home daily weights: not checking -Last ejection fraction: 40-45% (Date: 01/2022) (improved from 35-40% 06/2021) -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%) -NYHA Class: II (slight limitation of activity) -AHA HF Stage: C (Heart disease and symptoms present) -Current treatment: Carvedilol 6.25 mg BID - Appropriate, Effective, Safe, Accessible Furosemide 20 mg daily + extra PRN -Appropriate, Effective, Safe, Accessible Entresto 49-51 mg BID -Appropriate, Effective, Safe, Accessible Jardiance 10 mg daily -Appropriate, Effective, Safe, Accessible Klor Con 10 mEq daily -Appropriate, Effective, Safe, Accessible -Medications previously tried: losartan, HCTZ, spironolactone (weakness), telmisartan  -Educated on Benefits of medications for managing symptoms and prolonging life; Proper diuretic administration and potassium supplementation -Discussed Importance of weighing daily; if you gain more than 3 pounds in one day or 5 pounds in one week, contact provider -Recommended to continue current medication  Hyperlipidemia: (LDL goal < 70) -Controlled - LDL 71 (03/2021) adequate; pt does report muscle cramps at night that he attributes to rosuvastatin -Hx CAD - CABG 2008; abnormal stress test 06/2021 - pt opted for medical mgmt rather than cath -hx PAD (s/p toe amputations) -Current treatment: Rosuvastatin 20 mg daily - Appropriate, Effective, Safe, Accessible Aspirin 81 mg daily -Appropriate, Effective, Safe, Accessible -Medications previously tried: n/a  -Educated on Cholesterol goals; Benefits of statin for ASCVD risk reduction; -Discussed CoQ10 may be beneficial more muscle aches; he can also try spoonful of mustard -Recommended to continue current medication  Diabetes (A1c goal <7%) -Controlled - A1c 6.6% (12/2021) -Current home glucose readings fasting glucose: 104 Before dinner: 139-200 -Denies hypoglycemic/hyperglycemic symptoms -Current  medications: Jardiance 10 mg daily - Appropriate, Effective, Safe, Accessible Novolin 70/30 - 35 units BID -Appropriate, Effective, Safe, Accessible -Medications previously tried: metformin (GI), Ozempic (ate more?) -Current meal patterns: pt drinks diet coke, grape tea ([place] - gallon) -Current exercise: limited -Educated on A1c and blood sugar goals; Prevention and management of hypoglycemic episodes; -Discussed optimal timing of BG checks - fasting BG and 2-hr post prandial -Recommended to continue current medication  Recurrent DVT (Goal: prevent VTE) -Controlled -on prophylactic dose for VTE -Current treatment  Xarelto 10 mg daily -Appropriate, Effective, Safe, Accessible -Medications previously tried: n/a  -Reviewed s/sx of bleeding, importance of self-monitoring -Recommended to continue current medication  Daytime sleepiness/fatigue -Pt reports feeling sleepy after being awake for only a few hours, he will wake up around 6-7am, lay in bed on phone for a few hrs, get up and eat breakfast, then go back to bed shortly after until mid-late afternoon; he typically goes to bed around 12-1am -Reviewed sleep hygiene/daily routines to stay awake - activities around the house rather than sitting/watching TV -Reviewed possible medical contributors to low energy (vitamin deficiencies, hypothyroidism, depression) - can consider lab workup to rule out vitamin/thyroid issues -Pt is interested in aromatherapy; discussed this would be reasonable to try -Can try B12 1000 mcg daily  Health Maintenance -Vaccine gaps: Shingrix -On sertraline 100 mg -OTC: ferrous gluconate, Vit D 1000 IU -Hx of fall last year (09/2021) -Hx meningioma - surveillance MRI due ~June 2024   Charlene Brooke, PharmD, BCACP Clinical Pharmacist [facility] at [facility] [phone]

## 2022-03-23 NOTE — Patient Instructions (Signed)
Visit Information  Phone number for Pharmacist: [phone]  Thank you for meeting with me to discuss your medications! Below is a summary of what we talked about during the visit:   You can try Vitamin B12 1000 mcg per day to see if it helps with energy.  You can also try Coenzyme Q10 100 mg daily to see if it will help with muscle cramps.   Charlene Brooke, PharmD, BCACP Clinical Pharmacist [facility] at [facility] [phone]

## 2022-04-07 ENCOUNTER — Telehealth: Payer: Self-pay | Admitting: Cardiovascular Disease

## 2022-04-07 NOTE — Telephone Encounter (Signed)
 *  STAT* If patient is at the pharmacy, call can be transferred to refill team.   1. Which medications need to be refilled? (please list name of each medication and dose if known) empagliflozin (JARDIANCE) 10 MG TABS tablet  potassium chloride (KLOR-CON) 10 MEQ tablet   rosuvastatin (CRESTOR) 20 MG tablet    sacubitril-valsartan (ENTRESTO) 49-51 MG    furosemide (LASIX) 20 MG tablet   2. Which pharmacy/location (including street and city if local pharmacy) is medication to be sent to? [facility]   3. Do they need a 30 day or 90 day supply? 90 days

## 2022-04-10 ENCOUNTER — Other Ambulatory Visit: Payer: Self-pay

## 2022-04-10 ENCOUNTER — Other Ambulatory Visit: Payer: Self-pay | Admitting: Internal Medicine

## 2022-04-10 DIAGNOSIS — E113592 Type 2 diabetes mellitus with proliferative diabetic retinopathy without macular edema, left eye: Secondary | ICD-10-CM | POA: Diagnosis not present

## 2022-04-10 DIAGNOSIS — H31093 Other chorioretinal scars, bilateral: Secondary | ICD-10-CM | POA: Diagnosis not present

## 2022-04-10 DIAGNOSIS — H35033 Hypertensive retinopathy, bilateral: Secondary | ICD-10-CM | POA: Diagnosis not present

## 2022-04-10 DIAGNOSIS — E113511 Type 2 diabetes mellitus with proliferative diabetic retinopathy with macular edema, right eye: Secondary | ICD-10-CM | POA: Diagnosis not present

## 2022-04-10 MED ORDER — NOVOLIN 70/30 (70-30) 100 UNIT/ML ~~LOC~~ SUSP: SUBCUTANEOUS | 4 refills | Status: DC

## 2022-04-10 MED ORDER — RIVAROXABAN 10 MG PO TABS: 10.0000 mg | ORAL_TABLET | Freq: Every day | ORAL | 3 refills | Status: DC

## 2022-04-10 MED ORDER — ROSUVASTATIN CALCIUM 20 MG PO TABS: 20.0000 mg | ORAL_TABLET | Freq: Every day | ORAL | 3 refills | Status: DC

## 2022-04-10 MED ORDER — POTASSIUM CHLORIDE ER 10 MEQ PO TBCR: 10.0000 meq | EXTENDED_RELEASE_TABLET | Freq: Every day | ORAL | 3 refills | Status: DC

## 2022-04-10 MED ORDER — SERTRALINE HCL 100 MG PO TABS: 100.0000 mg | ORAL_TABLET | Freq: Every day | ORAL | 3 refills | Status: DC

## 2022-04-10 MED ORDER — ENTRESTO 49-51 MG PO TABS: 1.0000 | ORAL_TABLET | Freq: Two times a day (BID) | ORAL | 3 refills | Status: DC

## 2022-04-10 MED ORDER — EMPAGLIFLOZIN 10 MG PO TABS: 10.0000 mg | ORAL_TABLET | Freq: Every day | ORAL | 3 refills | Status: DC

## 2022-04-10 MED ORDER — CARVEDILOL 6.25 MG PO TABS: 6.2500 mg | ORAL_TABLET | Freq: Two times a day (BID) | ORAL | 3 refills | Status: DC

## 2022-04-10 MED ORDER — "INSULIN SYRINGE-NEEDLE U-100 31G X 5/16"" 0.5 ML MISC": 12 refills | Status: DC

## 2022-04-10 MED ORDER — FUROSEMIDE 20 MG PO TABS: ORAL_TABLET | ORAL | 3 refills | Status: DC

## 2022-04-10 NOTE — Telephone Encounter (Signed)
Prescription Request  04/10/2022  LOV: 01/12/2022  What is the name of the medication or equipment? TRUEPLUS INSULIN SYRINGE 31G X 5/16" 0.5 ML MISC, carvedilol (COREG) 6.25 MG tablet, NOVOLIN 70/30 (70-30) 100 UNIT/ML injection, sertraline (ZOLOFT) 100 MG tablet & XARELTO 10 MG TABS tablet    Have you contacted your pharmacy to request a refill? Yes  [facility] called requesting med refill to be sent to their pharmacy   Which pharmacy would you like this sent to?  [facility], # [phone]  Fax # - [phone]   Patient notified that their request is being sent to the clinical staff for review and that they should receive a response within 2 business days.   Please advise at [phone]

## 2022-04-10 NOTE — Telephone Encounter (Signed)
Requested Prescriptions   Signed Prescriptions Disp Refills   empagliflozin (JARDIANCE) 10 MG TABS tablet 90 tablet 3    Sig: Take 1 tablet (10 mg total) by mouth daily before breakfast.    Authorizing Provider: Minna Merritts    Ordering User: Janan Ridge   furosemide (LASIX) 20 MG tablet 180 tablet 3    Sig: TAKE 1 TABLET BY MOUTH ONCE A DAY. MAY TAKE ONE ADDITIONAL TABLET IF INCREASED SWELLING.    Authorizing Provider: Minna Merritts    Ordering User: Janan Ridge   potassium chloride (KLOR-CON) 10 MEQ tablet 90 tablet 3    Sig: Take 1 tablet (10 mEq total) by mouth daily.    Authorizing Provider: Minna Merritts    Ordering User: Janan Ridge   rosuvastatin (CRESTOR) 20 MG tablet 90 tablet 3    Sig: Take 1 tablet (20 mg total) by mouth daily.    Authorizing Provider: Minna Merritts    Ordering User: Janan Ridge   sacubitril-valsartan (ENTRESTO) 49-51 MG 180 tablet 3    Sig: Take 1 tablet by mouth 2 (two) times daily.    Authorizing Provider: Minna Merritts    Ordering User: Janan Ridge

## 2022-05-02 ENCOUNTER — Other Ambulatory Visit: Payer: Self-pay | Admitting: Cardiovascular Disease

## 2022-05-02 ENCOUNTER — Ambulatory Visit: Payer: Medicare HMO | Admitting: Internal Medicine

## 2022-05-04 ENCOUNTER — Telehealth: Payer: Self-pay

## 2022-05-04 NOTE — Progress Notes (Signed)
Care Management & Coordination Services Pharmacy Team  Reason for Encounter: Heart Failure update  Contacted patient on 05/04/2022 to discuss heart failure disease state Spoke with patient on 05/11/2022    Current heart failure regimen:   Carvedilol 6.25 mg BID  Furosemide 20 mg daily + extra PRN Entresto 49-51 mg BID  Jardiance 10 mg daily  Klor Con 10 mEq daily  Have you had to take an extra dose of diuretic recently? No   Do you weigh yourself daily or regularly? No  Patient reports stays between 260-265     Have any of the following symptoms worsened or changed from your baseline?  denies worsening of SOB, increased swelling, or abnormal weight gain of more than 3 pounds in one day or 5 pounds in one week  How often are you checking your Blood Pressure? infrequently  Patient does have BP monitor to use if having a crisis.  Wrist or arm cuff: arm Caffeine intake: no coffee, diet soda Salt intake: does NOT add to food OTC medications including pseudoephedrine or NSAIDs? No Exercise habits: patient reports apartment living .Encouraged patient to walk around development with weather warming.Agrees to increase activity   Chart Updates: Recent office visits:  None since last contact  Recent consult visits:  None since last contact  Hospital visits:  None in previous 6 months  Medications: Outpatient Encounter Medications as of 05/04/2022  Medication Sig   aspirin EC 81 MG tablet Take 1 tablet (81 mg total) by mouth daily.   carvedilol (COREG) 6.25 MG tablet Take 1 tablet (6.25 mg total) by mouth 2 (two) times daily.   Cholecalciferol 25 MCG (1000 UT) tablet Take 1 tablet by mouth daily.   empagliflozin (JARDIANCE) 10 MG TABS tablet Take 1 tablet (10 mg total) by mouth daily before breakfast.   ferrous gluconate (FERGON) 324 MG tablet TAKE 1 TABLET BY MOUTH ONCE A DAY   furosemide (LASIX) 20 MG tablet TAKE 1 TABLET BY MOUTH ONCE A DAY. MAY TAKE ONE ADDITIONAL TABLET IF  INCREASED SWELLING.   insulin NPH-regular Human (NOVOLIN 70/30) (70-30) 100 UNIT/ML injection INJECT 35 UNITS SUBCUTANEOUSLY TWICE A DAY AS DIRECTED   Insulin Syringe-Needle U-100 (TRUEPLUS INSULIN SYRINGE) 31G X 5/16" 0.5 ML MISC USE AS DIRECTED TWICE DAILY   mupirocin ointment (BACTROBAN) 2 % Apply 1 application topically daily.   potassium chloride (KLOR-CON) 10 MEQ tablet Take 1 tablet (10 mEq total) by mouth daily.   rivaroxaban (XARELTO) 10 MG TABS tablet Take 1 tablet (10 mg total) by mouth daily.   rosuvastatin (CRESTOR) 20 MG tablet Take 1 tablet (20 mg total) by mouth daily.   sacubitril-valsartan (ENTRESTO) 49-51 MG Take 1 tablet by mouth 2 (two) times daily.   sertraline (ZOLOFT) 100 MG tablet Take 1 tablet (100 mg total) by mouth daily.   triamcinolone cream (KENALOG) 0.1 % Apply 1 application topically 2 (two) times daily as needed.   No facility-administered encounter medications on file as of 05/04/2022.    Star Rating Drugs:  Medication:  Last Fill: Day Supply Jardiance 10mg  05/02/22  30 Novolin 70/30  04/15/22 29 Rosuvastatin 20mg  03/01/22 90 Entresto 49-51mg  05/02/22  30  Recent Office Vitals: BP Readings from Last 3 Encounters:  01/17/22 130/70  01/12/22 112/74  12/16/21 118/88   Pulse Readings from Last 3 Encounters:  01/17/22 73  01/12/22 76  12/16/21 79    Wt Readings from Last 3 Encounters:  01/17/22 262 lb 6 oz (119 kg)  01/12/22 253 lb  12.8 oz (115.1 kg)  12/16/21 256 lb 9.6 oz (116.4 kg)     Kidney Function Lab Results  Component Value Date/Time   CREATININE 1.18 09/29/2021 04:31 PM   CREATININE 1.08 09/20/2021 02:06 PM   CREATININE 1.10 08/13/2011 06:26 AM   CREATININE 1.11 08/11/2011 06:24 AM   GFR 67.12 04/22/2021 01:25 PM   GFRNONAA >60 09/29/2021 04:31 PM   GFRNONAA >60 08/13/2011 06:26 AM   GFRAA >60 03/20/2017 03:48 AM   GFRAA >60 08/13/2011 06:26 AM       Latest Ref Rng & Units 09/29/2021    4:31 PM 09/20/2021    2:06 PM 04/22/2021     1:25 PM  BMP  Glucose 70 - 99 mg/dL 098  119  147   BUN 8 - 23 mg/dL 24  21  15    Creatinine 0.61 - 1.24 mg/dL 8.29  5.62  1.30   Sodium 135 - 145 mmol/L 138  140  139   Potassium 3.5 - 5.1 mmol/L 3.9  4.0  4.3   Chloride 98 - 111 mmol/L 104  107  102   CO2 22 - 32 mmol/L 23  26  32   Calcium 8.9 - 10.3 mg/dL 9.1  9.1  9.2      Al Corpus, PharmD notified  Burt Knack, [facility] Clinical Pharmacy Assistant [phone]

## 2022-05-10 DIAGNOSIS — E113593 Type 2 diabetes mellitus with proliferative diabetic retinopathy without macular edema, bilateral: Secondary | ICD-10-CM | POA: Diagnosis not present

## 2022-05-10 DIAGNOSIS — Z961 Presence of intraocular lens: Secondary | ICD-10-CM | POA: Diagnosis not present

## 2022-05-10 DIAGNOSIS — H1820 Unspecified corneal edema: Secondary | ICD-10-CM | POA: Diagnosis not present

## 2022-05-10 LAB — HM DIABETES EYE EXAM

## 2022-05-17 ENCOUNTER — Other Ambulatory Visit: Payer: Self-pay | Admitting: Internal Medicine

## 2022-05-22 DIAGNOSIS — E1142 Type 2 diabetes mellitus with diabetic polyneuropathy: Secondary | ICD-10-CM | POA: Diagnosis not present

## 2022-05-26 ENCOUNTER — Other Ambulatory Visit: Payer: Self-pay | Admitting: Cardiovascular Disease

## 2022-06-02 ENCOUNTER — Other Ambulatory Visit: Payer: Self-pay | Admitting: Cardiovascular Disease

## 2022-06-05 DIAGNOSIS — H35033 Hypertensive retinopathy, bilateral: Secondary | ICD-10-CM | POA: Diagnosis not present

## 2022-06-05 DIAGNOSIS — E113511 Type 2 diabetes mellitus with proliferative diabetic retinopathy with macular edema, right eye: Secondary | ICD-10-CM | POA: Diagnosis not present

## 2022-06-05 DIAGNOSIS — E113592 Type 2 diabetes mellitus with proliferative diabetic retinopathy without macular edema, left eye: Secondary | ICD-10-CM | POA: Diagnosis not present

## 2022-06-05 DIAGNOSIS — H31093 Other chorioretinal scars, bilateral: Secondary | ICD-10-CM | POA: Diagnosis not present

## 2022-06-29 ENCOUNTER — Other Ambulatory Visit: Payer: Self-pay | Admitting: Cardiovascular Disease

## 2022-07-03 ENCOUNTER — Other Ambulatory Visit: Payer: Self-pay | Admitting: Cardiovascular Disease

## 2022-07-14 ENCOUNTER — Ambulatory Visit (INDEPENDENT_AMBULATORY_CARE_PROVIDER_SITE_OTHER): Payer: Medicare HMO | Admitting: Internal Medicine

## 2022-07-14 ENCOUNTER — Encounter: Payer: Self-pay | Admitting: Internal Medicine

## 2022-07-14 VITALS — BP 122/70 | HR 88 | Temp 97.3°F | Ht 69.0 in | Wt 264.0 lb

## 2022-07-14 DIAGNOSIS — E1142 Type 2 diabetes mellitus with diabetic polyneuropathy: Secondary | ICD-10-CM | POA: Diagnosis not present

## 2022-07-14 DIAGNOSIS — Z0000 Encounter for general adult medical examination without abnormal findings: Secondary | ICD-10-CM | POA: Diagnosis not present

## 2022-07-14 DIAGNOSIS — S98131A Complete traumatic amputation of one right lesser toe, initial encounter: Secondary | ICD-10-CM

## 2022-07-14 DIAGNOSIS — I739 Peripheral vascular disease, unspecified: Secondary | ICD-10-CM

## 2022-07-14 DIAGNOSIS — I25119 Atherosclerotic heart disease of native coronary artery with unspecified angina pectoris: Secondary | ICD-10-CM

## 2022-07-14 DIAGNOSIS — S98132A Complete traumatic amputation of one left lesser toe, initial encounter: Secondary | ICD-10-CM

## 2022-07-14 DIAGNOSIS — I82409 Acute embolism and thrombosis of unspecified deep veins of unspecified lower extremity: Secondary | ICD-10-CM | POA: Diagnosis not present

## 2022-07-14 DIAGNOSIS — E113599 Type 2 diabetes mellitus with proliferative diabetic retinopathy without macular edema, unspecified eye: Secondary | ICD-10-CM

## 2022-07-14 DIAGNOSIS — Z89421 Acquired absence of other right toe(s): Secondary | ICD-10-CM

## 2022-07-14 DIAGNOSIS — Z89422 Acquired absence of other left toe(s): Secondary | ICD-10-CM

## 2022-07-14 DIAGNOSIS — F3340 Major depressive disorder, recurrent, in remission, unspecified: Secondary | ICD-10-CM | POA: Diagnosis not present

## 2022-07-14 DIAGNOSIS — I5022 Chronic systolic (congestive) heart failure: Secondary | ICD-10-CM | POA: Diagnosis not present

## 2022-07-14 DIAGNOSIS — Z125 Encounter for screening for malignant neoplasm of prostate: Secondary | ICD-10-CM

## 2022-07-14 DIAGNOSIS — D329 Benign neoplasm of meninges, unspecified: Secondary | ICD-10-CM

## 2022-07-14 LAB — HEPATIC FUNCTION PANEL
ALT: 18 U/L (ref 0–53)
AST: 20 U/L (ref 0–37)
Albumin: 4 g/dL (ref 3.5–5.2)
Alkaline Phosphatase: 55 U/L (ref 39–117)
Bilirubin, Direct: 0.1 mg/dL (ref 0.0–0.3)
Total Bilirubin: 0.7 mg/dL (ref 0.2–1.2)
Total Protein: 6.7 g/dL (ref 6.0–8.3)

## 2022-07-14 LAB — LIPID PANEL
Cholesterol: 142 mg/dL (ref 0–200)
HDL: 35.7 mg/dL — ABNORMAL LOW (ref >39.00)
LDL Cholesterol: 82 mg/dL (ref 0–99)
NonHDL: 106.39
Total CHOL/HDL Ratio: 4
Triglycerides: 123 mg/dL (ref 0.0–149.0)
VLDL: 24.6 mg/dL (ref 0.0–40.0)

## 2022-07-14 LAB — RENAL FUNCTION PANEL
Albumin: 4 g/dL (ref 3.5–5.2)
BUN: 17 mg/dL (ref 6–23)
CO2: 27 mEq/L (ref 19–32)
Calcium: 9.3 mg/dL (ref 8.4–10.5)
Chloride: 103 mEq/L (ref 96–112)
Creatinine, Ser: 1.05 mg/dL (ref 0.40–1.50)
GFR: 71.9 mL/min (ref >60.00)
Glucose, Bld: 184 mg/dL — ABNORMAL HIGH (ref 70–99)
Phosphorus: 3.8 mg/dL (ref 2.3–4.6)
Potassium: 4.7 mEq/L (ref 3.5–5.1)
Sodium: 138 mEq/L (ref 135–145)

## 2022-07-14 LAB — CBC
HCT: 43.7 % (ref 39.0–52.0)
Hemoglobin: 14.2 g/dL (ref 13.0–17.0)
MCHC: 32.4 g/dL (ref 30.0–36.0)
MCV: 87.7 fl (ref 78.0–100.0)
Platelets: 236 10*3/uL (ref 150.0–400.0)
RBC: 4.99 Mil/uL (ref 4.22–5.81)
RDW: 14.6 % (ref 11.5–15.5)
WBC: 7.2 10*3/uL (ref 4.0–10.5)

## 2022-07-14 LAB — HEMOGLOBIN A1C: Hgb A1c MFr Bld: 7.3 % — ABNORMAL HIGH (ref 4.6–6.5)

## 2022-07-14 LAB — MICROALBUMIN / CREATININE URINE RATIO
Creatinine,U: 61.4 mg/dL
Microalb Creat Ratio: 4.4 mg/g (ref 0.0–30.0)
Microalb, Ur: 2.7 mg/dL — ABNORMAL HIGH (ref 0.0–1.9)

## 2022-07-14 LAB — HM DIABETES FOOT EXAM

## 2022-07-14 LAB — TSH: TSH: 1.42 u[IU]/mL (ref 0.35–5.50)

## 2022-07-14 LAB — PSA, MEDICARE: PSA: 0.26 ng/ml (ref 0.10–4.00)

## 2022-07-14 NOTE — Assessment & Plan Note (Signed)
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

## 2022-07-14 NOTE — Assessment & Plan Note (Signed)
Chronic dysthymia  Continues on sertraline 100

## 2022-07-14 NOTE — Assessment & Plan Note (Signed)
No claudication but not much activity

## 2022-07-14 NOTE — Assessment & Plan Note (Signed)
Stable DOE and vague chest pain Discussed trying at least chair exercises ASA 81, rosuvastatin 20

## 2022-07-14 NOTE — Assessment & Plan Note (Signed)
I have personally reviewed the Medicare Annual Wellness questionnaire and have noted 1. The patient's medical and social history 2. Their use of alcohol, tobacco or illicit drugs 3. Their current medications and supplements 4. The patient's functional ability including ADL's, fall risks, home safety risks and hearing or visual             impairment. 5. Diet and physical activities 6. Evidence for depression or mood disorders  The patients weight, height, BMI and visual acuity have been recorded in the chart I have made referrals, counseling and provided education to the patient based review of the above and I have provided the pt with a written personalized care plan for preventive services.  I have provided you with a copy of your personalized plan for preventive services. Please take the time to review along with your updated medication list.  Not able to exercise Colon due again 2026 Will check last PSA Thinks he had shingrix Not excited about COVID updates--I recommended Flu/RSV in the fall

## 2022-07-14 NOTE — Progress Notes (Signed)
Subjective:    Patient ID: NICKOLAI ZERBE, male    DOB: 08-02-1950, 72 y.o.   MRN: [id number]  HPI Here for Medicare wellness visit and follow up of chronic health conditions Reviewed advanced directives Reviewed other doctors---Dr Gollan--cardiology, Dr Peyton Najjar, Dr Ventura Sellers, Dr Mansouraty--GI, Dr Emmie Niemann Had fall with major injuries August of last year--no surgery Vision is not that good---no recent decline Hearing is okay No tobacco or alcohol Did have one other fall--in yard and no injury Doesn't exercise Chronic mood problems Does his own shopping. Help monthly with heavy housework. He does other instrumental ADLs No apparent memory issues  Checks sugars 2-3 times a day Usually under 130 fasting Can be up at times Some numbness in feet  Occasional chest pain--usually brief. Not really exertional Breathing is about same--fairly easy DOE Sleeps in bed--flat. No PND No edema No dizziness or syncope  Appetite is okay Weight fairly stable--BMI ~39  Known meningioma Possibel intraosseous spread  MRI less than a year ago  Mood is "okay" Intermittent depressed mood---lost friend ~6 months ago No suicidal ideation Sleeps okay Mostly watches TV and plays on table  Current Outpatient Medications on File Prior to Visit  Medication Sig Dispense Refill   aspirin EC 81 MG tablet Take 1 tablet (81 mg total) by mouth daily. 150 tablet 2   carvedilol (COREG) 6.25 MG tablet Take 1 tablet (6.25 mg total) by mouth 2 (two) times daily. 180 tablet 3   Cholecalciferol 25 MCG (1000 UT) tablet Take 1 tablet by mouth daily.     empagliflozin (JARDIANCE) 10 MG TABS tablet Take 1 tablet (10 mg total) by mouth daily before breakfast. 90 tablet 3   ferrous gluconate (FERGON) 324 MG tablet TAKE 1 TABLET BY MOUTH ONCE A DAY 30 tablet 0   furosemide (LASIX) 20 MG tablet TAKE 1 TABLET BY MOUTH ONCE A DAY. MAY TAKE ONE ADDITIONAL TABLET IF INCREASED SWELLING. 180 tablet 3    Insulin Syringe-Needle U-100 (TRUEPLUS INSULIN SYRINGE) 31G X 5/16" 0.5 ML MISC USE AS DIRECTED TWICE DAILY 100 each 12   mupirocin ointment (BACTROBAN) 2 % Apply 1 application topically daily. 22 g 1   NOVOLIN 70/30 (70-30) 100 UNIT/ML injection INJECT 35 UNITS SUBCUTANEOUSLY TWICE A DAY AS DIRECTED 20 mL 11   potassium chloride (KLOR-CON) 10 MEQ tablet Take 1 tablet (10 mEq total) by mouth daily. 90 tablet 3   rivaroxaban (XARELTO) 10 MG TABS tablet Take 1 tablet (10 mg total) by mouth daily. 90 tablet 3   rosuvastatin (CRESTOR) 20 MG tablet Take 1 tablet (20 mg total) by mouth daily. 90 tablet 3   sacubitril-valsartan (ENTRESTO) 49-51 MG TAKE ONE TABLET BY MOUTH TWO TIMES DAILY 60 tablet 0   sertraline (ZOLOFT) 100 MG tablet Take 1 tablet (100 mg total) by mouth daily. 90 tablet 3   triamcinolone cream (KENALOG) 0.1 % Apply 1 application topically 2 (two) times daily as needed. 45 g 1   No current facility-administered medications on file prior to visit.    Allergies  Allergen Reactions   Losartan Other (See Comments)    Not an allergy -  Not an allergy -   Flu-like symptoms   Lisinopril     Cough Flu like symptoms     Past Medical History:  Diagnosis Date   Anemia    hx of   Arthritis    Asthma    childhood   Cataract    CHF (congestive heart failure) (HCC)  SHOB at times   Coronary artery disease    Diabetic peripheral neuropathy associated with type 2 diabetes mellitus (HCC)    DM (diabetes mellitus), type 2, uncontrolled, periph vascular complic    Type II   Edema    feet/legs   Hx of colonic polyps    Hypertension    Kidney stones    Left leg DVT (HCC)    2000s   MDD (recurrent major depressive disorder) in remission (HCC)    on meds   Mitral regurgitation 08/10/2011   Peripheral vascular disease (HCC)    Left toe amputations. Dr Wyn Quaker   Pulmonary embolism (HCC) 09/05/2005   Pure hypercholesterolemia    Shortness of breath dyspnea    Syncope    resolved    Wears glasses     Past Surgical History:  Procedure Laterality Date   AMPUTATION TOE Left 03/17/2017   Procedure: FIRST RAY RESECTION LEFT FOOT;  Surgeon: Linus Galas, DPM;  Location: [facility] ORS;  Service: Podiatry;  Laterality: Left;   BIOPSY  06/18/2019   Procedure: BIOPSY;  Surgeon: Meridee Score Netty Starring., MD;  Location: [facility] ENDOSCOPY;  Service: Gastroenterology;;   CARDIAC CATHETERIZATION     CATARACT EXTRACTION W/PHACO Left 06/03/2015   Procedure: CATARACT EXTRACTION PHACO AND INTRAOCULAR LENS PLACEMENT (IOC);  Surgeon: Nevada Crane, MD;  Location: [facility] ORS;  Service: Ophthalmology;  Laterality: Left;  Lot# [id number] H [place]: 1.06 AP%: 5.5 CDE: 3.69   CATARACT EXTRACTION W/PHACO Right 12/10/2017   Procedure: Aborted phacoemusification of the right eye.  No intraocular lens was implated.;  Surgeon: Nevada Crane, MD;  Location: [facility]GERY CN[facility];  Service: Ophthalmology;  Laterality: Right;  Diabetic - insulin   COLONOSCOPY WITH PROPOFOL N/A 06/18/2019   Procedure: COLONOSCOPY WITH PROPOFOL;  Surgeon: Meridee Score Netty Starring., MD;  Location: [facility] ENDOSCOPY;  Service: Gastroenterology;  Laterality: N/A;   CORONARY ARTERY BYPASS GRAFT  2008   ENDOSCOPIC MUCOSAL RESECTION N/A 06/18/2019   Procedure: ENDOSCOPIC MUCOSAL RESECTION;  Surgeon: Meridee Score Netty Starring., MD;  Location: [facility] ENDOSCOPY;  Service: Gastroenterology;  Laterality: N/A;   ESOPHAGOGASTRODUODENOSCOPY (EGD) WITH PROPOFOL N/A 06/18/2019   Procedure: ESOPHAGOGASTRODUODENOSCOPY (EGD) WITH PROPOFOL;  Surgeon: Meridee Score Netty Starring., MD;  Location: [facility] ENDOSCOPY;  Service: Gastroenterology;  Laterality: N/A;   EYE SURGERY     HEMOSTASIS CLIP PLACEMENT  06/18/2019   Procedure: HEMOSTASIS CLIP PLACEMENT;  Surgeon: Lemar Lofty., MD;  Location: [facility] ENDOSCOPY;  Service: Gastroenterology;;   PARS PLANA VITRECTOMY Right 02/17/2015   Procedure: PARS PLANA VITRECTOMY WITH 25 GAUGE, endolaser, right eye;  Surgeon: Marcelene Butte, MD;  Location: [facility] ORS;  Service: Ophthalmology;  Laterality: Right;  fluid pack lot # [id number] H endolaser: 200w, 1439p   PERCUTANEOUS STENT INTERVENTION     right leg   PERIPHERAL VASCULAR BALLOON ANGIOPLASTY Left 03/20/2017   Procedure: PERIPHERAL VASCULAR BALLOON ANGIOPLASTY;  Surgeon: Renford Dills, MD;  Location: [facility] INVASIVE CV LAB;  Service: Cardiovascular;  Laterality: Left;   PERIPHERALLY INSERTED CENTRAL CATHETER INSERTION  2013   POLYPECTOMY  06/18/2019   Procedure: POLYPECTOMY;  Surgeon: Mansouraty, Netty Starring., MD;  Location: [facility] ENDOSCOPY;  Service: Gastroenterology;;   SUBMUCOSAL LIFTING INJECTION  06/18/2019   Procedure: SUBMUCOSAL LIFTING INJECTION;  Surgeon: Lemar Lofty., MD;  Location: [facility] ENDOSCOPY;  Service: Gastroenterology;;   TOE AMPUTATION     left 5th, right 4th and 5th    Family History  Problem Relation Age of Onset   Heart disease Sister    CAD Brother  Hyperlipidemia Brother    Colon cancer Neg Hx    Esophageal cancer Neg Hx    Inflammatory bowel disease Neg Hx    Liver disease Neg Hx    Pancreatic cancer Neg Hx    Rectal cancer Neg Hx    Stomach cancer Neg Hx    Colon polyps Neg Hx     Social History   Socioeconomic History   Marital status: Divorced    Spouse name: Not on file   Number of children: 0   Years of education: Not on file   Highest education level: Not on file  Occupational History   Occupation: Systems analyst    Comment: Disabled since 2000's  Tobacco Use   Smoking status: Never    Passive exposure: Past   Smokeless tobacco: Never  Vaping Use   Vaping Use: Never used  Substance and Sexual Activity   Alcohol use: No    Alcohol/week: 0.0 standard drinks of alcohol   Drug use: No   Sexual activity: Not on file  Other Topics Concern   Not on file  Social History Narrative   Has living will   Bryn Gulling, sister, as health care POA.   Would accept resuscitation attempts   Not sure about tube  feeds   Social Determinants of Health   Financial Resource Strain: Low Risk  (03/24/2022)   Overall Financial Resource Strain (CARDIA)    Difficulty of Paying Living Expenses: Not very hard  Food Insecurity: Not on file  Transportation Needs: No Transportation Needs (03/24/2022)   PRAPARE - Transportation    Lack of Transportation (Medical): No    Lack of Transportation (Non-Medical): No  Physical Activity: Not on file  Stress: Not on file  Social Connections: Socially Isolated (03/24/2022)   Social Connection and Isolation Panel [NHANES]    Frequency of Communication with Friends and Family: Once a week    Frequency of Social Gatherings with Friends and Family: Once a week    Attends Religious Services: Never    Database administrator or Organizations: No    Attends Engineer, structural: Never    Marital Status: Divorced  Catering manager Violence: Not on file   Review of Systems Wears seat belt Teeth are okay--keeps up with dentist No suspicious skin lesions No heartburn or dysphagia Bowels move fine--no blood Some back aching if he does a lot--rest helps Voids fine--flow is okay    Objective:   Physical Exam Constitutional:      Appearance: Normal appearance.  HENT:     Mouth/Throat:     Pharynx: No oropharyngeal exudate or posterior oropharyngeal erythema.  Eyes:     Conjunctiva/sclera: Conjunctivae normal.     Pupils: Pupils are equal, round, and reactive to light.  Cardiovascular:     Rate and Rhythm: Normal rate and regular rhythm.     Heart sounds: No murmur heard.    No gallop.     Comments: Feet warm without pulses Pulmonary:     Effort: Pulmonary effort is normal.     Breath sounds: Normal breath sounds. No wheezing or rales.  Abdominal:     Palpations: Abdomen is soft.     Tenderness: There is no abdominal tenderness.  Musculoskeletal:     Cervical back: Neck supple.     Right lower leg: No edema.     Left lower leg: No edema.     Comments:  Absent toes--- left !/5, right 4/5 No calf swelling or tenderness  Lymphadenopathy:     Cervical: No cervical adenopathy.  Skin:    Findings: No lesion or rash.  Neurological:     General: No focal deficit present.     Mental Status: He is alert and oriented to person, place, and time.     Comments: Word naming 9/1 minute Recall 2/3 Decreased sensation in feet  Psychiatric:        Mood and Affect: Mood normal.        Behavior: Behavior normal.            Assessment & Plan:

## 2022-07-14 NOTE — Assessment & Plan Note (Signed)
No problems on daily xarelto 10

## 2022-07-14 NOTE — Assessment & Plan Note (Signed)
stable °

## 2022-07-14 NOTE — Assessment & Plan Note (Signed)
Sees retina specialist

## 2022-07-14 NOTE — Assessment & Plan Note (Signed)
Weight stable Discussed trying some exercise

## 2022-07-14 NOTE — Assessment & Plan Note (Signed)
Will consider repeat MRI in 6 months

## 2022-07-14 NOTE — Assessment & Plan Note (Signed)
Compensated on furosemide 20mg , carvedilol 6.25 bid, entresto 49/51 bid

## 2022-07-19 ENCOUNTER — Ambulatory Visit: Payer: Medicare HMO | Admitting: Cardiovascular Disease

## 2022-07-31 DIAGNOSIS — E113511 Type 2 diabetes mellitus with proliferative diabetic retinopathy with macular edema, right eye: Secondary | ICD-10-CM | POA: Diagnosis not present

## 2022-07-31 DIAGNOSIS — E113592 Type 2 diabetes mellitus with proliferative diabetic retinopathy without macular edema, left eye: Secondary | ICD-10-CM | POA: Diagnosis not present

## 2022-08-04 ENCOUNTER — Other Ambulatory Visit: Payer: Self-pay | Admitting: Cardiovascular Disease

## 2022-08-14 NOTE — Progress Notes (Signed)
Date:  08/21/2022   ID:  Benjamin Davidson, DOB Mar 26, 1950, MRN [id number]  Patient Location:  [address] [place] [address]   Provider location:   [facility], [place] office  PCP:  Karie Schwalbe, MD  Cardiologist:  [facility]bard [facility]  Chief Complaint  Patient presents with   Follow-up    Patient reports feeling lightheaded and increased fatigue over the weekend with no syncopal episode.  Orthostatic blood pressures obtained today.  Slight dizziness upon sitting from lying.    History of Present Illness:    Benjamin Davidson is a 72 y.o. 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,   diabetes,  A1C 7 sedentary lifestyle,   history of DVT in the left leg,  on xarelto Carotid disease <39% b/l who presents for routine followup of his coronary artery disease, peripheral arterial disease  Last seen by myself in the office December 2023  Cardiogram February 27, 2022 EF 40 to 45% Up from prior study June 2023 EF 35 to 40% Echo jan 2024: EF 40 to 45% Stress test 6/23 showing fixed defect mid to apical anterior, anteroseptal wall  Labs reviewed A1C 7.3 Total chol 142, LDL 82  Side effects on spironolactone (added 11/23 office visit)  balance problem, stopped it  Sedentary, Has membership to [employer], uses the steam room Denies significant chest pain, shortness of breath, no PND orthopnea, no change to lower extremity edema  EKG personally reviewed by myself on todays visit EKG Interpretation Date/Time:  Monday August 21 2022 11:37:01 EDT Ventricular Rate:  68 PR Interval:  174 QRS Duration:  150 QT Interval:  468 QTC Calculation: 497 R Axis:   27  Text Interpretation: Normal sinus rhythm with sinus arrhythmia Left bundle branch block When compared with ECG of 29-Sep-2021 17:04, Left bundle branch block is now Present Confirmed by Julien Nordmann [phone]) on 08/21/2022 12:07:19 PM    Other past medical history reviewed  hospital admission February 2019 Left great toe osteomyelitis with ulceration Amputation left great toe,  blister that never healed   Vascular surgery March 20, 2017 Angioplasty of left posterior tibial Angioplasty left peroneal artery  History of osteomyelitis requiring wound VAC on his right foot, 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 He had gangrene of the fifth toe on the right with acute renal failure and dehydration admitted 08/07/2011  last stress test July 2008 showing mild ischemia in the mid anteroseptal, apical anterior and apical septal region.   Past Medical History:  Diagnosis Date   Anemia    hx of   Arthritis    Asthma    childhood   Cataract    CHF (congestive heart failure) (HCC)    SHOB at times   Coronary artery disease    Diabetic peripheral neuropathy associated with type 2 diabetes mellitus (HCC)    DM (diabetes mellitus), type 2, uncontrolled, periph vascular complic    Type II   Edema    feet/legs   Hx of colonic polyps    Hypertension    Kidney stones    Left leg DVT (HCC)    2000s   MDD (recurrent major depressive disorder) in remission (HCC)    on meds   Mitral regurgitation 08/10/2011   Peripheral vascular disease (HCC)    Left toe amputations. Dr Wyn Quaker   Pulmonary embolism (HCC) 09/05/2005  Pure hypercholesterolemia    Shortness of breath dyspnea    Syncope    resolved   Wears glasses    Past Surgical History:  Procedure Laterality Date   AMPUTATION TOE Left 03/17/2017   Procedure: FIRST RAY RESECTION LEFT FOOT;  Surgeon: Linus Galas, DPM;  Location: [facility] ORS;  Service: Podiatry;  Laterality: Left;   BIOPSY  06/18/2019   Procedure: BIOPSY;  Surgeon: Meridee Score Netty Starring., MD;  Location: [facility] ENDOSCOPY;  Service: Gastroenterology;;   CARDIAC CATHETERIZATION     CATARACT EXTRACTION W/PHACO Left 06/03/2015   Procedure: CATARACT EXTRACTION PHACO AND INTRAOCULAR LENS  PLACEMENT (IOC);  Surgeon: Nevada Crane, MD;  Location: [facility] ORS;  Service: Ophthalmology;  Laterality: Left;  Lot# [id number] H [place]: 1.06 AP%: 5.5 CDE: 3.69   CATARACT EXTRACTION W/PHACO Right 12/10/2017   Procedure: Aborted phacoemusification of the right eye.  No intraocular lens was implated.;  Surgeon: Nevada Crane, MD;  Location: [facility] SURGERY CNTR;  Service: Ophthalmology;  Laterality: Right;  Diabetic - insulin   COLONOSCOPY WITH PROPOFOL N/A 06/18/2019   Procedure: COLONOSCOPY WITH PROPOFOL;  Surgeon: Meridee Score Netty Starring., MD;  Location: [facility] ENDOSCOPY;  Service: Gastroenterology;  Laterality: N/A;   CORONARY ARTERY BYPASS GRAFT  2008   ENDOSCOPIC MUCOSAL RESECTION N/A 06/18/2019   Procedure: ENDOSCOPIC MUCOSAL RESECTION;  Surgeon: Meridee Score Netty Starring., MD;  Location: [facility] ENDOSCOPY;  Service: Gastroenterology;  Laterality: N/A;   ESOPHAGOGASTRODUODENOSCOPY (EGD) WITH PROPOFOL N/A 06/18/2019   Procedure: ESOPHAGOGASTRODUODENOSCOPY (EGD) WITH PROPOFOL;  Surgeon: Meridee Score Netty Starring., MD;  Location: [facility] ENDOSCOPY;  Service: Gastroenterology;  Laterality: N/A;   EYE SURGERY     HEMOSTASIS CLIP PLACEMENT  06/18/2019   Procedure: HEMOSTASIS CLIP PLACEMENT;  Surgeon: Lemar Lofty., MD;  Location: [facility] ENDOSCOPY;  Service: Gastroenterology;;   PARS PLANA VITRECTOMY Right 02/17/2015   Procedure: PARS PLANA VITRECTOMY WITH 25 GAUGE, endolaser, right eye;  Surgeon: Marcelene Butte, MD;  Location: [facility] ORS;  Service: Ophthalmology;  Laterality: Right;  fluid pack lot # [id number] H endolaser: 200w, 1439p   PERCUTANEOUS STENT INTERVENTION     right leg   PERIPHERAL VASCULAR BALLOON ANGIOPLASTY Left 03/20/2017   Procedure: PERIPHERAL VASCULAR BALLOON ANGIOPLASTY;  Surgeon: Renford Dills, MD;  Location: [facility] INVASIVE CV LAB;  Service: Cardiovascular;  Laterality: Left;   PERIPHERALLY INSERTED CENTRAL CATHETER INSERTION  2013   POLYPECTOMY  06/18/2019   Procedure: POLYPECTOMY;   Surgeon: Mansouraty, Netty Starring., MD;  Location: [facility] ENDOSCOPY;  Service: Gastroenterology;;   SUBMUCOSAL LIFTING INJECTION  06/18/2019   Procedure: SUBMUCOSAL LIFTING INJECTION;  Surgeon: Lemar Lofty., MD;  Location: [facility] ENDOSCOPY;  Service: Gastroenterology;;   TOE AMPUTATION     left 5th, right 4th and 5th    Current Outpatient Medications on File Prior to Visit  Medication Sig Dispense Refill   aspirin EC 81 MG tablet Take 1 tablet (81 mg total) by mouth daily. 150 tablet 2   carvedilol (COREG) 6.25 MG tablet Take 1 tablet (6.25 mg total) by mouth 2 (two) times daily. 180 tablet 3   Cholecalciferol 25 MCG (1000 UT) tablet Take 1 tablet by mouth daily.     empagliflozin (JARDIANCE) 10 MG TABS tablet Take 1 tablet (10 mg total) by mouth daily before breakfast. 90 tablet 3   ferrous gluconate (FERGON) 324 MG tablet TAKE 1 TABLET BY MOUTH ONCE A DAY 30 tablet 0   furosemide (LASIX) 20 MG tablet TAKE 1 TABLET BY MOUTH ONCE A DAY. MAY TAKE ONE ADDITIONAL TABLET IF INCREASED SWELLING.  180 tablet 3   Insulin Syringe-Needle U-100 (TRUEPLUS INSULIN SYRINGE) 31G X 5/16" 0.5 ML MISC USE AS DIRECTED TWICE DAILY 100 each 12   mupirocin ointment (BACTROBAN) 2 % Apply 1 application topically daily. 22 g 1   NOVOLIN 70/30 (70-30) 100 UNIT/ML injection INJECT 35 UNITS SUBCUTANEOUSLY TWICE A DAY AS DIRECTED 20 mL 11   potassium chloride (KLOR-CON) 10 MEQ tablet Take 1 tablet (10 mEq total) by mouth daily. 90 tablet 3   rivaroxaban (XARELTO) 10 MG TABS tablet Take 1 tablet (10 mg total) by mouth daily. 90 tablet 3   rosuvastatin (CRESTOR) 20 MG tablet TAKE ONE TABLET BY MOUTH ONCE A DAY 90 tablet 0   sacubitril-valsartan (ENTRESTO) 49-51 MG TAKE ONE TABLET BY MOUTH TWO TIMES DAILY 60 tablet 0   sertraline (ZOLOFT) 100 MG tablet Take 1 tablet (100 mg total) by mouth daily. 90 tablet 3   triamcinolone cream (KENALOG) 0.1 % Apply 1 application topically 2 (two) times daily as needed. 45 g 1   No  current facility-administered medications on file prior to visit.    Allergies:   Losartan and Lisinopril   Social History   Tobacco Use   Smoking status: Never    Passive exposure: Past   Smokeless tobacco: Never  Vaping Use   Vaping status: Never Used  Substance Use Topics   Alcohol use: No    Alcohol/week: 0.0 standard drinks of alcohol   Drug use: No    Family Hx: The patient's family history includes CAD in his brother; Heart disease in his sister; Hyperlipidemia in his brother. There is no history of Colon cancer, Esophageal cancer, Inflammatory bowel disease, Liver disease, Pancreatic cancer, Rectal cancer, Stomach cancer, or Colon polyps.  ROS:   Please see the history of present illness.    Review of Systems  Constitutional: Negative.   HENT: Negative.    Respiratory:  Positive for shortness of breath.   Cardiovascular: Negative.   Gastrointestinal: Negative.   Musculoskeletal: Negative.   Neurological: Negative.   Psychiatric/Behavioral: Negative.    All other systems reviewed and are negative.    Labs/Other Tests and Data Reviewed:    Recent Labs: 07/14/2022: ALT 18; BUN 17; Creatinine, Ser 1.05; Hemoglobin 14.2; Platelets 236.0; Potassium 4.7 Hemolysis seen...; Sodium 138; TSH 1.42   Recent Lipid Panel Lab Results  Component Value Date/Time   CHOL 142 07/14/2022 01:21 PM   TRIG 123.0 07/14/2022 01:21 PM   HDL 35.70 (L) 07/14/2022 01:21 PM   CHOLHDL 4 07/14/2022 01:21 PM   LDLCALC 82 07/14/2022 01:21 PM    Wt Readings from Last 3 Encounters:  08/21/22 267 lb 12.8 oz (121.5 kg)  07/14/22 264 lb (119.7 kg)  01/17/22 262 lb 6 oz (119 kg)     Exam:    BP (!) 143/85 (BP Location: Left Arm, Patient Position: Sitting, Cuff Size: Normal)   Pulse 68   Ht 5' 9.5" (1.765 m)   Wt 267 lb 12.8 oz (121.5 kg)   SpO2 98%   BMI 38.98 kg/m  Constitutional:  oriented to person, place, and time. No distress.  HENT:  Head: Grossly normal Eyes:  no discharge. No  scleral icterus.  Neck: No JVD, no carotid bruits  Cardiovascular: Regular rate and rhythm, no murmurs appreciated Pulmonary/Chest: Clear to auscultation bilaterally, no wheezes or rails Abdominal: Soft.  no distension.  no tenderness.  Musculoskeletal: Normal range of motion Neurological:  normal muscle tone. Coordination normal. No atrophy Skin: Skin warm and dry  Psychiatric: normal affect, pleasant   ASSESSMENT & PLAN:    PAD (peripheral artery disease) (HCC) Previously followed by vascular, dr. Lorretta Harp Denies claudication symptoms, diabetes numbers improving, cholesterol slightly above goal  Morbid obesity (HCC) Weight continues to run high Dietary changes discussed with him, also recommended some exercise program at [facility]  Type 2 diabetes, controlled, with peripheral neuropathy (HCC) A1c trending higher Recommended low carb diet, walking program Could consider Ozempic/Mounjaro  Cardiomyopathy History of coronary disease, CABG, previously declining cardiac catheterization Low risk stress test with fixed defect Recommend he continue Entresto, carvedilol, Jardiance, Lasix He did not tolerate spironolactone secondary to gait instability  Pure hypercholesterolemia Cholesterol trending higher in the setting of higher A1c Dietary changes recommended for weight loss  Essential hypertension Blood pressure is well controlled on today's visit. No changes made to the medications.  Atherosclerosis of native coronary artery of native heart with stable angina pectoris (HCC) Currently with no symptoms of angina. No further workup at this time. Continue current medication regimen. Ejection fraction 40 to 45% Previously declined cardiac catheterization Medical management as above  Shortness of breath Reports longstanding issue, likely from deconditioning, obesity, low ejection fraction Recommend water walking, recumbent bike at [facility]   Total encounter time more than 30  minutes  Greater than 50% was spent in counseling and coordination of care with the patient   Signed, Julien Nordmann, MD  08/21/2022 12:03 PM    [facility] [address], [place], [place] [address]

## 2022-08-21 ENCOUNTER — Encounter: Payer: Self-pay | Admitting: Cardiovascular Disease

## 2022-08-21 ENCOUNTER — Ambulatory Visit: Payer: Medicare HMO | Attending: Cardiovascular Disease | Admitting: Cardiovascular Disease

## 2022-08-21 VITALS — BP 143/85 | HR 68 | Ht 69.5 in | Wt 267.8 lb

## 2022-08-21 DIAGNOSIS — I5022 Chronic systolic (congestive) heart failure: Secondary | ICD-10-CM

## 2022-08-21 DIAGNOSIS — I739 Peripheral vascular disease, unspecified: Secondary | ICD-10-CM

## 2022-08-21 DIAGNOSIS — I10 Essential (primary) hypertension: Secondary | ICD-10-CM | POA: Diagnosis not present

## 2022-08-21 DIAGNOSIS — I255 Ischemic cardiomyopathy: Secondary | ICD-10-CM | POA: Diagnosis not present

## 2022-08-21 DIAGNOSIS — I5020 Unspecified systolic (congestive) heart failure: Secondary | ICD-10-CM | POA: Diagnosis not present

## 2022-08-21 DIAGNOSIS — E7800 Pure hypercholesterolemia, unspecified: Secondary | ICD-10-CM | POA: Diagnosis not present

## 2022-08-21 DIAGNOSIS — I2510 Atherosclerotic heart disease of native coronary artery without angina pectoris: Secondary | ICD-10-CM | POA: Diagnosis not present

## 2022-08-21 DIAGNOSIS — Z794 Long term (current) use of insulin: Secondary | ICD-10-CM

## 2022-08-21 DIAGNOSIS — E1142 Type 2 diabetes mellitus with diabetic polyneuropathy: Secondary | ICD-10-CM

## 2022-08-21 DIAGNOSIS — N183 Chronic kidney disease, stage 3 unspecified: Secondary | ICD-10-CM

## 2022-08-21 DIAGNOSIS — E1122 Type 2 diabetes mellitus with diabetic chronic kidney disease: Secondary | ICD-10-CM

## 2022-08-21 NOTE — Patient Instructions (Signed)
Medication Instructions:  No changes  If you need a refill on your cardiac medications before your next appointment, please call your pharmacy.    Lab work: No new labs needed   Testing/Procedures: No new testing needed   Follow-Up: At [facility], you and your health needs are our priority.  As part of our continuing mission to provide you with exceptional heart care, we have created designated Provider Care Teams.  These Care Teams include your primary Cardiologist (physician) and Advanced Practice Providers (APPs -  Physician Assistants and Nurse Practitioners) who all work together to provide you with the care you need, when you need it.  You will need a follow up appointment in 6 months  Providers on your designated Care Team:   Christopher Berge, NP Ryan Dunn, PA-C Cadence Furth, PA-C  COVID-19 Vaccine Information can be found at: [email or web address] For questions related to vaccine distribution or appointments, please email [email or web address] or call [phone].   

## 2022-08-29 DIAGNOSIS — E1142 Type 2 diabetes mellitus with diabetic polyneuropathy: Secondary | ICD-10-CM | POA: Diagnosis not present

## 2022-08-31 ENCOUNTER — Other Ambulatory Visit: Payer: Self-pay | Admitting: Cardiovascular Disease

## 2022-09-04 ENCOUNTER — Other Ambulatory Visit: Payer: Self-pay | Admitting: Internal Medicine

## 2022-09-18 ENCOUNTER — Encounter: Payer: Medicare HMO | Admitting: Pharmacist

## 2022-09-25 DIAGNOSIS — H35033 Hypertensive retinopathy, bilateral: Secondary | ICD-10-CM | POA: Diagnosis not present

## 2022-09-25 DIAGNOSIS — H31093 Other chorioretinal scars, bilateral: Secondary | ICD-10-CM | POA: Diagnosis not present

## 2022-09-25 DIAGNOSIS — E113592 Type 2 diabetes mellitus with proliferative diabetic retinopathy without macular edema, left eye: Secondary | ICD-10-CM | POA: Diagnosis not present

## 2022-09-25 DIAGNOSIS — E113511 Type 2 diabetes mellitus with proliferative diabetic retinopathy with macular edema, right eye: Secondary | ICD-10-CM | POA: Diagnosis not present

## 2022-10-04 ENCOUNTER — Other Ambulatory Visit: Payer: Self-pay | Admitting: Medical

## 2022-10-16 ENCOUNTER — Other Ambulatory Visit: Payer: Self-pay | Admitting: Internal Medicine

## 2022-10-31 ENCOUNTER — Other Ambulatory Visit: Payer: Self-pay | Admitting: Cardiovascular Disease

## 2022-11-20 DIAGNOSIS — E113511 Type 2 diabetes mellitus with proliferative diabetic retinopathy with macular edema, right eye: Secondary | ICD-10-CM | POA: Diagnosis not present

## 2022-12-01 DIAGNOSIS — E1142 Type 2 diabetes mellitus with diabetic polyneuropathy: Secondary | ICD-10-CM | POA: Diagnosis not present

## 2022-12-07 DIAGNOSIS — E1142 Type 2 diabetes mellitus with diabetic polyneuropathy: Secondary | ICD-10-CM | POA: Diagnosis not present

## 2022-12-19 ENCOUNTER — Other Ambulatory Visit: Payer: Self-pay | Admitting: Cardiovascular Disease

## 2022-12-19 NOTE — Telephone Encounter (Signed)
last office visit: 08/21/22 with plan to f/u in 6 months  next office visit: none/active recall

## 2023-01-02 ENCOUNTER — Telehealth: Payer: Self-pay | Admitting: Internal Medicine

## 2023-01-02 NOTE — Telephone Encounter (Signed)
Patient dropped off document Insurance Form, to be filled out by provider. Patient requested to send it back via Call Patient to pick up within 5-days. Document is located in providers tray at front office.Please advise at Mobile [phone] (mobile)

## 2023-01-02 NOTE — Telephone Encounter (Signed)
Form placed in Dr Karle Starch inbox on his desk. He had only checked Chronic Heart Disease, but he is also a Diabetic, so I checked that box, also.

## 2023-01-03 NOTE — Telephone Encounter (Signed)
Spoke to pt. I will fax it for him.

## 2023-01-11 ENCOUNTER — Other Ambulatory Visit: Payer: Self-pay | Admitting: Internal Medicine

## 2023-01-15 ENCOUNTER — Encounter: Payer: Self-pay | Admitting: Internal Medicine

## 2023-01-15 ENCOUNTER — Ambulatory Visit (INDEPENDENT_AMBULATORY_CARE_PROVIDER_SITE_OTHER): Payer: Medicare HMO | Admitting: Internal Medicine

## 2023-01-15 VITALS — BP 118/76 | HR 73 | Temp 97.8°F | Resp 95 | Ht 69.5 in | Wt 260.0 lb

## 2023-01-15 DIAGNOSIS — Z794 Long term (current) use of insulin: Secondary | ICD-10-CM | POA: Diagnosis not present

## 2023-01-15 DIAGNOSIS — I5022 Chronic systolic (congestive) heart failure: Secondary | ICD-10-CM | POA: Diagnosis not present

## 2023-01-15 DIAGNOSIS — F3340 Major depressive disorder, recurrent, in remission, unspecified: Secondary | ICD-10-CM

## 2023-01-15 DIAGNOSIS — D329 Benign neoplasm of meninges, unspecified: Secondary | ICD-10-CM

## 2023-01-15 DIAGNOSIS — Z23 Encounter for immunization: Secondary | ICD-10-CM

## 2023-01-15 DIAGNOSIS — E1142 Type 2 diabetes mellitus with diabetic polyneuropathy: Secondary | ICD-10-CM | POA: Diagnosis not present

## 2023-01-15 DIAGNOSIS — Z7984 Long term (current) use of oral hypoglycemic drugs: Secondary | ICD-10-CM | POA: Diagnosis not present

## 2023-01-15 LAB — POCT GLYCOSYLATED HEMOGLOBIN (HGB A1C): Hemoglobin A1C: 6.9 % — AB (ref 4.0–5.6)

## 2023-01-15 NOTE — Progress Notes (Signed)
Subjective:    Patient ID: Benjamin Davidson, male    DOB: 1950/04/13, 72 y.o.   MRN: [id number]  HPI Here for follow up of diabetes and other chronic health conditions  Doing okay Checks sugars bid Continues on bid 70/30 insulin and jardiance Trying to reduce bread intake Lost a few poumds No hypoglycemic reactions  No heart symptoms No chest pain Stable DOE---has to walk slowly, some trouble carrying groceries No edema Sleeps flat in bed---no PND  No headaches No focal weakness or neurologic symptoms  Current Outpatient Medications on File Prior to Visit  Medication Sig Dispense Refill   aspirin EC 81 MG tablet Take 1 tablet (81 mg total) by mouth daily. 150 tablet 2   carvedilol (COREG) 6.25 MG tablet Take 1 tablet (6.25 mg total) by mouth 2 (two) times daily. 180 tablet 3   Cholecalciferol 25 MCG (1000 UT) tablet Take 1 tablet by mouth daily.     empagliflozin (JARDIANCE) 10 MG TABS tablet TAKE ONE TABLET BY MOUTH DAILY BEFORE BREAKFAST 30 tablet 2   ferrous gluconate (FERGON) 324 MG tablet TAKE 1 TABLET BY MOUTH ONCE A DAY 30 tablet 0   furosemide (LASIX) 20 MG tablet TAKE 1 TABLET BY MOUTH ONCE A DAY. MAY TAKE ONE ADDITIONAL TABLET IF INCREASED SWELLING. 180 tablet 3   mupirocin ointment (BACTROBAN) 2 % Apply 1 application topically daily. 22 g 1   NOVOLIN 70/30 (70-30) 100 UNIT/ML injection INJECT 35 UNITS SUBCUTANEOUSLY TWICE A DAY AS DIRECTED 20 mL 11   potassium chloride (KLOR-CON) 10 MEQ tablet Take 1 tablet (10 mEq total) by mouth daily. 90 tablet 3   rivaroxaban (XARELTO) 10 MG TABS tablet TAKE ONE TABLET BY MOUTH ONCE A DAY 90 tablet 3   rosuvastatin (CRESTOR) 20 MG tablet TAKE ONE TABLET BY MOUTH ONCE A DAY 90 tablet 0   sacubitril-valsartan (ENTRESTO) 49-51 MG TAKE ONE TABLET BY MOUTH TWO TIMES DAILY 60 tablet 2   sertraline (ZOLOFT) 100 MG tablet TAKE ONE TABLET BY MOUTH ONCE DAILY 90 tablet 3   triamcinolone cream (KENALOG) 0.1 % Apply 1 application topically  2 (two) times daily as needed. 45 g 1   TRUEPLUS INSULIN SYRINGE 31G X 5/16" 0.5 ML MISC USE AS DIRECTED TWICE DAILY 100 each 12   No current facility-administered medications on file prior to visit.    Allergies  Allergen Reactions   Losartan Other (See Comments)    Not an allergy -  Not an allergy -   Flu-like symptoms   Lisinopril     Cough Flu like symptoms     Past Medical History:  Diagnosis Date   Anemia    hx of   Arthritis    Asthma    childhood   Cataract    CHF (congestive heart failure) (HCC)    SHOB at times   Coronary artery disease    Diabetic peripheral neuropathy associated with type 2 diabetes mellitus (HCC)    DM (diabetes mellitus), type 2, uncontrolled, periph vascular complic    Type II   Edema    feet/legs   Hx of colonic polyps    Hypertension    Kidney stones    Left leg DVT (HCC)    2000s   MDD (recurrent major depressive disorder) in remission (HCC)    on meds   Mitral regurgitation 08/10/2011   Peripheral vascular disease (HCC)    Left toe amputations. Dr Wyn Quaker   Pulmonary embolism (HCC) 09/05/2005  Pure hypercholesterolemia    Shortness of breath dyspnea    Syncope    resolved   Wears glasses     Past Surgical History:  Procedure Laterality Date   AMPUTATION TOE Left 03/17/2017   Procedure: FIRST RAY RESECTION LEFT FOOT;  Surgeon: Linus Galas, DPM;  Location: [facility] ORS;  Service: Podiatry;  Laterality: Left;   BIOPSY  06/18/2019   Procedure: BIOPSY;  Surgeon: Meridee Score Netty Starring., MD;  Location: [facility] END[facility]COPY;  Service: Gastroenterology;;   CARDIAC CATHETERIZATION     CATARACT EXTRACTION W/PHACO Left 06/03/2015   Procedure: CATARACT EXTRACTION PHACO AND INTRAOCULAR LENS PLACEMENT (IOC);  Surgeon: Nevada Crane, MD;  Location: [facility] ORS;  Service: Ophthalmology;  Laterality: Left;  Lot# [id number] H [place]: 1.06 AP%: 5.5 CDE: 3.69   CATARACT EXTRACTION W/PHACO Right 12/10/2017   Procedure: Aborted phacoemusification of the right  eye.  No intraocular lens was implated.;  Surgeon: Nevada Crane, MD;  Location: [facility];  Service: Ophthalmology;  Laterality: Right;  Diabetic - insulin   COLONOSCOPY WITH PROPOFOL N/A 06/18/2019   Procedure: COLONOSCOPY WITH PROPOFOL;  Surgeon: Meridee Score Netty Starring., MD;  Location: [facility] ENDOSCOPY;  Service: Gastroenterology;  Laterality: N/A;   CORONARY ARTERY BYPASS GRAFT  2008   ENDOSCOPIC MUCOSAL RESECTION N/A 06/18/2019   Procedure: ENDOSCOPIC MUCOSAL RESECTION;  Surgeon: Meridee Score Netty Starring., MD;  Location: [facility] ENDOSCOPY;  Service: Gastroenterology;  Laterality: N/A;   ESOPHAGOGASTRODUODENOSCOPY (EGD) WITH PROPOFOL N/A 06/18/2019   Procedure: ESOPHAGOGASTRODUODENOSCOPY (EGD) WITH PROPOFOL;  Surgeon: Meridee Score Netty Starring., MD;  Location: [facility];  Service: Gastroenterology;  Laterality: N/A;   EYE SURGERY     HEMOSTASIS CLIP PLACEMENT  06/18/2019   Procedure: HEMOSTASIS CLIP PLACEMENT;  Surgeon: Lemar Lofty., MD;  Location: [facility] END[facility]COPY;  Service: Gastroenterology;;   PARS PLANA VITRECTOMY Right 02/17/2015   Procedure: PARS PLANA VITRECTOMY WITH 25 GAUGE, endolaser, right eye;  Surgeon: Marcelene Butte, MD;  Location: [facility] ORS;  Service: Ophthalmology;  Laterality: Right;  fluid pack lot # [id number] H endolaser: 200w, 1439p   PERCUTANEOUS STENT INTERVENTION     right leg   PERIPHERAL VASCULAR BALLOON ANGIOPLASTY Left 03/20/2017   Procedure: PERIPHERAL VASCULAR BALLOON ANGIOPLASTY;  Surgeon: Renford Dills, MD;  Location: [facility] INVASIVE CV LAB;  Service: Cardiovascular;  Laterality: Left;   PERIPHERALLY INSERTED CENTRAL CATHETER INSERTION  2013   POLYPECTOMY  06/18/2019   Procedure: POLYPECTOMY;  Surgeon: Mansouraty, Netty Starring., MD;  Location: [facility] ENDOSCOPY;  Service: Gastroenterology;;   SUBMUCOSAL LIFTING INJECTION  06/18/2019   Procedure: SUBMUCOSAL LIFTING INJECTION;  Surgeon: Lemar Lofty., MD;  Location: [facility] ENDOSCOPY;  Service:  Gastroenterology;;   TOE AMPUTATION     left 5th, right 4th and 5th    Family History  Problem Relation Age of Onset   Heart disease Sister    CAD Brother    Hyperlipidemia Brother    Colon cancer Neg Hx    Esophageal cancer Neg Hx    Inflammatory bowel disease Neg Hx    Liver disease Neg Hx    Pancreatic cancer Neg Hx    Rectal cancer Neg Hx    Stomach cancer Neg Hx    Colon polyps Neg Hx     Social History   Socioeconomic History   Marital status: Divorced    Spouse name: Not on file   Number of children: 0   Years of education: Not on file   Highest education level: Not on file  Occupational History   Occupation: [employer]  Comment: Disabled since 2000's  Tobacco Use   Smoking status: Never    Passive exposure: Past   Smokeless tobacco: Never  Vaping Use   Vaping status: Never Used  Substance and Sexual Activity   Alcohol use: No    Alcohol/week: 0.0 standard drinks of alcohol   Drug use: No   Sexual activity: Not on file  Other Topics Concern   Not on file  Social History Narrative   Has living will   Bryn Gulling, sister, as health care POA.   Would accept resuscitation attempts   Not sure about tube feeds   Social Drivers of Health   Financial Resource Strain: Low Risk  (03/24/2022)   Overall Financial Resource Strain (CARDIA)    Difficulty of Paying Living Expenses: Not very hard  Food Insecurity: Not on file  Transportation Needs: No Transportation Needs (03/24/2022)   PRAPARE - Transportation    Lack of Transportation (Medical): No    Lack of Transportation (Non-Medical): No  Physical Activity: Not on file  Stress: No Stress Concern Present (08/16/2020)   Received from [facility], [facility] of Occupational Health - Occupational Stress Questionnaire    Feeling of Stress : Not at all  Social Connections: Socially Isolated (03/24/2022)   Social Connection and Isolation Panel [NHANES]    Frequency of Communication with  Friends and Family: Once a week    Frequency of Social Gatherings with Friends and Family: Once a week    Attends Religious Services: Never    Database administrator or Organizations: No    Attends Banker Meetings: Never    Marital Status: Divorced  Catering manager Violence: Unknown (05/03/2021)   Received from [employer], [facility]   HITS    Physically Hurt: Not on file    Insult or Talk Down To: Not on file    Threaten Physical Harm: Not on file    Scream or Curse: Not on file   Review of Systems Sleeps okay--- 6-7 hours per night Mood generally okay Has neighbor that looks out for him    Objective:   Physical Exam Constitutional:      Appearance: Normal appearance.  Cardiovascular:     Rate and Rhythm: Normal rate and regular rhythm.     Heart sounds: No murmur heard.    No gallop.     Comments: No pedal pulses Pulmonary:     Effort: Pulmonary effort is normal.     Breath sounds: Normal breath sounds. No wheezing or rales.  Musculoskeletal:     Cervical back: Neck supple.     Right lower leg: No edema.     Left lower leg: No edema.  Lymphadenopathy:     Cervical: No cervical adenopathy.  Skin:    Comments: No foot lesions  Neurological:     Mental Status: He is alert.            Assessment & Plan:

## 2023-01-15 NOTE — Assessment & Plan Note (Signed)
Adjusts his insulin or holds--if sugars low (no reactions though) 70/30 --- 35 units bid And jardiance (told him not to hold this) Lab Results  Component Value Date   HGBA1C 6.9 (A) 01/15/2023   Control is good!

## 2023-01-15 NOTE — Assessment & Plan Note (Signed)
This is compensated with furosemide 20 prn, entresto 49/51 bid and the jardiance
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
## 2023-01-15 NOTE — Assessment & Plan Note (Signed)
Doing well on the sertraline 100mg  daily Discussed not stopping

## 2023-01-20 ENCOUNTER — Encounter: Payer: Self-pay | Admitting: *Deleted

## 2023-01-22 ENCOUNTER — Telehealth: Payer: Self-pay | Admitting: Internal Medicine

## 2023-01-22 NOTE — Telephone Encounter (Signed)
Reason for CRM: Caller from [facility] called stated they need clarification on an order for diabetic medical supplies. States there were two signature and they need clarification on 2nd signature. Will also be faxing request.Callback:  [phone]  Thank You

## 2023-01-25 NOTE — Telephone Encounter (Signed)
Corrected the form and faxed it back.

## 2023-01-27 ENCOUNTER — Other Ambulatory Visit: Payer: Self-pay | Admitting: Cardiovascular Disease

## 2023-01-29 DIAGNOSIS — E113592 Type 2 diabetes mellitus with proliferative diabetic retinopathy without macular edema, left eye: Secondary | ICD-10-CM | POA: Diagnosis not present

## 2023-01-29 DIAGNOSIS — H35033 Hypertensive retinopathy, bilateral: Secondary | ICD-10-CM | POA: Diagnosis not present

## 2023-01-29 DIAGNOSIS — H31093 Other chorioretinal scars, bilateral: Secondary | ICD-10-CM | POA: Diagnosis not present

## 2023-01-29 DIAGNOSIS — E113511 Type 2 diabetes mellitus with proliferative diabetic retinopathy with macular edema, right eye: Secondary | ICD-10-CM | POA: Diagnosis not present

## 2023-01-30 ENCOUNTER — Other Ambulatory Visit: Payer: Self-pay | Admitting: Cardiovascular Disease

## 2023-01-30 MED ORDER — EMPAGLIFLOZIN 10 MG PO TABS: 10.0000 mg | ORAL_TABLET | Freq: Every day | ORAL | 6 refills | Status: DC

## 2023-02-22 ENCOUNTER — Other Ambulatory Visit: Payer: Self-pay | Admitting: Cardiovascular Disease

## 2023-02-23 NOTE — Telephone Encounter (Signed)
Please contact pt for future appointment. Pt due for 6 month f/u.

## 2023-02-26 NOTE — Telephone Encounter (Signed)
Pt scheduled on 4/14

## 2023-02-28 ENCOUNTER — Other Ambulatory Visit: Payer: Self-pay | Admitting: Cardiovascular Disease

## 2023-02-28 NOTE — Telephone Encounter (Signed)
Please advise if ok to refill Carvedilol last filled by pcp.

## 2023-03-06 ENCOUNTER — Other Ambulatory Visit: Payer: Self-pay | Admitting: Cardiovascular Disease

## 2023-03-26 DIAGNOSIS — H31093 Other chorioretinal scars, bilateral: Secondary | ICD-10-CM | POA: Diagnosis not present

## 2023-03-26 DIAGNOSIS — H35033 Hypertensive retinopathy, bilateral: Secondary | ICD-10-CM | POA: Diagnosis not present

## 2023-03-26 DIAGNOSIS — E113592 Type 2 diabetes mellitus with proliferative diabetic retinopathy without macular edema, left eye: Secondary | ICD-10-CM | POA: Diagnosis not present

## 2023-03-26 DIAGNOSIS — E113511 Type 2 diabetes mellitus with proliferative diabetic retinopathy with macular edema, right eye: Secondary | ICD-10-CM | POA: Diagnosis not present

## 2023-04-04 ENCOUNTER — Telehealth: Payer: Self-pay | Admitting: Internal Medicine

## 2023-04-04 NOTE — Telephone Encounter (Signed)
 Called and spoke to pt. I advised that we do not know where to send the supplies. All plans are different. Asked if he had spoken to his insurance to find out where the supplies need to go. He said insurance gave him a number [phone] to call. I Googled the phone number he gave and it was to [employer] [facility]. There was a form he needed to fill out for them to contact him to set up an account. I did that for him and advised him that someone would be in contact with him.

## 2023-04-04 NOTE — Telephone Encounter (Signed)
 Copied from CRM [id number]. Topic: General - Other >> Apr 04, 2023  9:38 AM Denese Killings wrote: Reason for CRM: Patient is requesting a callback in regards to diabetic supplies.

## 2023-04-04 NOTE — Telephone Encounter (Signed)
 Copied from CRM [id number]. Topic: Clinical - Medication Question >> Apr 03, 2023  4:03 PM Suzette B wrote: Reason for CRM: Patient states that he has just gotten new insurance and doesn't know how to go about getting his diabetic supplies has not been able to check his blood sugar since the middle of February. He is trying to have the supplies shipped to him but needed the provider to assist him with order the necessary supplies [phone]

## 2023-04-04 NOTE — Telephone Encounter (Signed)
 Spoke to pt. [employer] [facility] advised him we can Fax Rx to [phone]. He would prefer a One Touch Meter and supplies over a CGM.

## 2023-04-05 NOTE — Telephone Encounter (Signed)
Faxed back and received confirmation.

## 2023-04-05 NOTE — Telephone Encounter (Signed)
 We received an order form from [employer] [facility] for his testing supplies. Form placed in Dr Karle Starch inbox on his desk.

## 2023-04-25 ENCOUNTER — Other Ambulatory Visit: Payer: Self-pay | Admitting: Cardiovascular Disease

## 2023-05-10 DIAGNOSIS — E113592 Type 2 diabetes mellitus with proliferative diabetic retinopathy without macular edema, left eye: Secondary | ICD-10-CM | POA: Diagnosis not present

## 2023-05-10 DIAGNOSIS — E113511 Type 2 diabetes mellitus with proliferative diabetic retinopathy with macular edema, right eye: Secondary | ICD-10-CM | POA: Diagnosis not present

## 2023-05-13 NOTE — Progress Notes (Deleted)
 Date:  05/13/2023   ID:  Benjamin Davidson Real, DOB 1950-05-23, MRN [id number]  Patient Location:  [address] [place] [address]   Provider location:   [facility]er, [place] office  PCP:  Helaine Llanos, MD  Cardiologist:  [facility]  No chief complaint on file.   History of Present Illness:    Benjamin Davidson is a 73 y.o. 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,   diabetes,  A1C 7 sedentary lifestyle,   history of DVT in the left leg,  on xarelto Carotid disease <39% b/l who presents for routine followup of his coronary artery disease, peripheral arterial disease  Last seen by myself in the office 7/24    Cardiogram February 27, 2022 EF 40 to 45% Up from prior study June 2023 EF 35 to 40% Echo jan 2024: EF 40 to 45% Stress test 6/23 showing fixed defect mid to apical anterior, anteroseptal wall  Labs reviewed A1C 7.3 Total chol 142, LDL 82  Side effects on spironolactone (added 11/23 office visit)  balance problem, stopped it  Sedentary, Has membership to [employer], uses the steam room Denies significant chest pain, shortness of breath, no PND orthopnea, no change to lower extremity edema  EKG personally reviewed by myself on todays visit     Other past medical history reviewed  hospital admission February 2019 Left great toe osteomyelitis with ulceration Amputation left great toe,  blister that never healed   Vascular surgery March 20, 2017 Angioplasty of left posterior tibial Angioplasty left peroneal artery  History of osteomyelitis requiring wound VAC on his right foot, 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 He had gangrene of the fifth toe on the right with acute renal failure and dehydration admitted 08/07/2011  last stress test July 2008 showing mild ischemia in the mid anteroseptal, apical anterior and apical septal region.    Past Medical History:  Diagnosis Date   Anemia    hx of   Arthritis    Asthma    childhood   Cataract    CHF (congestive heart failure) (HCC)    SHOB at times   Coronary artery disease    Diabetic peripheral neuropathy associated with type 2 diabetes mellitus (HCC)    DM (diabetes mellitus), type 2, uncontrolled, periph vascular complic    Type II   Edema    feet/legs   Hx of colonic polyps    Hypertension    Kidney stones    Left leg DVT (HCC)    2000s   MDD (recurrent major depressive disorder) in remission (HCC)    on meds   Mitral regurgitation 08/10/2011   Peripheral vascular disease (HCC)    Left toe amputations. Dr Vonna Guardian   Pulmonary embolism (HCC) 09/05/2005   Pure hypercholesterolemia    Shortness of breath dyspnea    Syncope    resolved   Wears glasses    Past Surgical History:  Procedure Laterality Date   AMPUTATION TOE Left 03/17/2017   Procedure: FIRST RAY RESECTION LEFT FOOT;  Surgeon: Angel Barba, DPM;  Location: [facility] ORS;  Service: Podiatry;  Laterality: Left;   BIOPSY  06/18/2019   Procedure: BIOPSY;  Surgeon: Brice Campi Albino Alu., MD;  Location: [facility] ENDOSCOPY;  Service: Gastroenterology;;   CARDIAC CATHETERIZATION     CATARACT EXTRACTION W/PHACO Left 06/03/2015   Procedure: CATARACT EXTRACTION PHACO  AND INTRAOCULAR LENS PLACEMENT (IOC);  Surgeon: Nevada Crane, MD;  Location: [facility] ORS;  Service: Ophthalmology;  Laterality: Left;  Lot# [id number] H [place]: 1.06 AP%: 5.5 CDE: 3.69   CATARACT EXTRACTION W/PHACO Right 12/10/2017   Procedure: Aborted phacoemusification of the right eye.  No intraocular lens was implated.;  Surgeon: Nevada Crane, MD;  Location: [facility];  Service: Ophthalmology;  Laterality: Right;  Diabetic - insulin   COLONOSCOPY WITH PROPOFOL N/A 06/18/2019   Procedure: COLONOSCOPY WITH PROPOFOL;  Surgeon: Meridee Score Netty Starring., MD;  Location: [facility] ENDOSCOPY;  Service: Gastroenterology;  Laterality: N/A;   CORONARY  ARTERY BYPASS GRAFT  2008   ENDOSCOPIC MUCOSAL RESECTION N/A 06/18/2019   Procedure: ENDOSCOPIC MUCOSAL RESECTION;  Surgeon: Meridee Score Netty Starring., MD;  Location: [facility] ENDOSCOPY;  Service: Gastroenterology;  Laterality: N/A;   ESOPHAGOGASTRODUODENOSCOPY (EGD) WITH PROPOFOL N/A 06/18/2019   Procedure: ESOPHAGOGASTRODUODENOSCOPY (EGD) WITH PROPOFOL;  Surgeon: Meridee Score Netty Starring., MD;  Location: [facility] ENDOSCOPY;  Service: Gastroenterology;  Laterality: N/A;   EYE SURGERY     HEMOSTASIS CLIP PLACEMENT  06/18/2019   Procedure: HEMOSTASIS CLIP PLACEMENT;  Surgeon: Lemar Lofty., MD;  Location: [facility] ENDOSCOPY;  Service: Gastroenterology;;   PARS PLANA VITRECTOMY Right 02/17/2015   Procedure: PARS PLANA VITRECTOMY WITH 25 GAUGE, endolaser, right eye;  Surgeon: Marcelene Butte, MD;  Location: [facility] ORS;  Service: Ophthalmology;  Laterality: Right;  fluid pack lot # [id number] H endolaser: 200w, 1439p   PERCUTANEOUS STENT INTERVENTION     right leg   PERIPHERAL VASCULAR BALLOON ANGIOPLASTY Left 03/20/2017   Procedure: PERIPHERAL VASCULAR BALLOON ANGIOPLASTY;  Surgeon: Renford Dills, MD;  Location: [facility] INVASIVE CV LAB;  Service: Cardiovascular;  Laterality: Left;   PERIPHERALLY INSERTED CENTRAL CATHETER INSERTION  2013   POLYPECTOMY  06/18/2019   Procedure: POLYPECTOMY;  Surgeon: Mansouraty, Netty Starring., MD;  Location: [facility] ENDOSCOPY;  Service: Gastroenterology;;   SUBMUCOSAL LIFTING INJECTION  06/18/2019   Procedure: SUBMUCOSAL LIFTING INJECTION;  Surgeon: Lemar Lofty., MD;  Location: [facility] ENDOSCOPY;  Service: Gastroenterology;;   TOE AMPUTATION     left 5th, right 4th and 5th    Current Outpatient Medications on File Prior to Visit  Medication Sig Dispense Refill   aspirin EC 81 MG tablet Take 1 tablet (81 mg total) by mouth daily. 150 tablet 2   carvedilol (COREG) 6.25 MG tablet TAKE ONE TABLET BY MOUTH TWO TIMES DAILY 180 tablet 0   Cholecalciferol 25 MCG (1000 UT) tablet Take 1  tablet by mouth daily.     empagliflozin (JARDIANCE) 10 MG TABS tablet Take 1 tablet (10 mg total) by mouth daily. 30 tablet 6   ferrous gluconate (FERGON) 324 MG tablet TAKE 1 TABLET BY MOUTH ONCE A DAY 30 tablet 0   furosemide (LASIX) 20 MG tablet TAKE 1 TABLET BY MOUTH ONCE A DAY. MAY TAKE ONE ADDITIONAL TABLET IF INCREASED SWELLING. 180 tablet 3   mupirocin ointment (BACTROBAN) 2 % Apply 1 application topically daily. 22 g 1   NOVOLIN 70/30 (70-30) 100 UNIT/ML injection INJECT 35 UNITS SUBCUTANEOUSLY TWICE A DAY AS DIRECTED 20 mL 11   potassium chloride (KLOR-CON) 10 MEQ tablet Take 1 tablet (10 mEq total) by mouth daily. 90 tablet 1   rivaroxaban (XARELTO) 10 MG TABS tablet TAKE ONE TABLET BY MOUTH ONCE A DAY 90 tablet 3   rosuvastatin (CRESTOR) 20 MG tablet TAKE ONE TABLET BY MOUTH ONCE A DAY 90 tablet 3   sacubitril-valsartan (ENTRESTO) 49-51 MG TAKE ONE TABLET BY MOUTH TWO  TIMES DAILY 60 tablet 1   sertraline (ZOLOFT) 100 MG tablet TAKE ONE TABLET BY MOUTH ONCE DAILY 90 tablet 3   triamcinolone cream (KENALOG) 0.1 % Apply 1 application topically 2 (two) times daily as needed. 45 g 1   TRUEPLUS INSULIN SYRINGE 31G X 5/16" 0.5 ML MISC USE AS DIRECTED TWICE DAILY 100 each 12   No current facility-administered medications on file prior to visit.    Allergies:   Losartan and Lisinopril   Social History   Tobacco Use   Smoking status: Never    Passive exposure: Past   Smokeless tobacco: Never  Vaping Use   Vaping status: Never Used  Substance Use Topics   Alcohol use: No    Alcohol/week: 0.0 standard drinks of alcohol   Drug use: No    Family Hx: The patient's family history includes CAD in his brother; Heart disease in his sister; Hyperlipidemia in his brother. There is no history of Colon cancer, Esophageal cancer, Inflammatory bowel disease, Liver disease, Pancreatic cancer, Rectal cancer, Stomach cancer, or Colon polyps.  ROS:   Please see the history of present illness.     Review of Systems  Constitutional: Negative.   HENT: Negative.    Respiratory:  Positive for shortness of breath.   Cardiovascular: Negative.   Gastrointestinal: Negative.   Musculoskeletal: Negative.   Neurological: Negative.   Psychiatric/Behavioral: Negative.    All other systems reviewed and are negative.    Labs/Other Tests and Data Reviewed:    Recent Labs: 07/14/2022: ALT 18; BUN 17; Creatinine, Ser 1.05; Hemoglobin 14.2; Platelets 236.0; Potassium 4.7 Hemolysis seen...; Sodium 138; TSH 1.42   Recent Lipid Panel Lab Results  Component Value Date/Time   CHOL 142 07/14/2022 01:21 PM   TRIG 123.0 07/14/2022 01:21 PM   HDL 35.70 (L) 07/14/2022 01:21 PM   CHOLHDL 4 07/14/2022 01:21 PM   LDLCALC 82 07/14/2022 01:21 PM    Wt Readings from Last 3 Encounters:  01/15/23 260 lb (117.9 kg)  08/21/22 267 lb 12.8 oz (121.5 kg)  07/14/22 264 lb (119.7 kg)     Exam:    There were no vitals taken for this visit. Constitutional:  oriented to person, place, and time. No distress.  HENT:  Head: Grossly normal Eyes:  no discharge. No scleral icterus.  Neck: No JVD, no carotid bruits  Cardiovascular: Regular rate and rhythm, no murmurs appreciated Pulmonary/Chest: Clear to auscultation bilaterally, no wheezes or rails Abdominal: Soft.  no distension.  no tenderness.  Musculoskeletal: Normal range of motion Neurological:  normal muscle tone. Coordination normal. No atrophy Skin: Skin warm and dry Psychiatric: normal affect, pleasant   ASSESSMENT & PLAN:    PAD (peripheral artery disease) (HCC) Previously followed by vascular, dr. Alfonse Iha Denies claudication symptoms, diabetes numbers improving, cholesterol slightly above goal  Morbid obesity (HCC) Weight continues to run high Dietary changes discussed with him, also recommended some exercise program at [facility]  Type 2 diabetes, controlled, with peripheral neuropathy (HCC) A1c trending higher Recommended low carb  diet, walking program Could consider Ozempic/Mounjaro  Cardiomyopathy History of coronary disease, CABG, previously declining cardiac catheterization Low risk stress test with fixed defect Recommend he continue Entresto, carvedilol, Jardiance, Lasix He did not tolerate spironolactone secondary to gait instability  Pure hypercholesterolemia Cholesterol trending higher in the setting of higher A1c Dietary changes recommended for weight loss  Essential hypertension Blood pressure is well controlled on today's visit. No changes made to the medications.  Atherosclerosis of native coronary  artery of native heart with stable angina pectoris (HCC) Currently with no symptoms of angina. No further workup at this time. Continue current medication regimen. Ejection fraction 40 to 45% Previously declined cardiac catheterization Medical management as above  Shortness of breath Reports longstanding issue, likely from deconditioning, obesity, low ejection fraction Recommend water walking, recumbent bike at [facility]   Total encounter time more than 30 minutes  Greater than 50% was spent in counseling and coordination of care with the patient   Signed, Belva Boyden, MD  05/13/2023 11:54 AM    [facility] [address], [place], [place] [address]

## 2023-05-14 ENCOUNTER — Ambulatory Visit: Payer: Self-pay | Attending: Cardiovascular Disease | Admitting: Cardiovascular Disease

## 2023-05-14 DIAGNOSIS — E7800 Pure hypercholesterolemia, unspecified: Secondary | ICD-10-CM

## 2023-05-14 DIAGNOSIS — I5022 Chronic systolic (congestive) heart failure: Secondary | ICD-10-CM

## 2023-05-14 DIAGNOSIS — I25119 Atherosclerotic heart disease of native coronary artery with unspecified angina pectoris: Secondary | ICD-10-CM

## 2023-05-14 DIAGNOSIS — I6529 Occlusion and stenosis of unspecified carotid artery: Secondary | ICD-10-CM

## 2023-05-14 DIAGNOSIS — E1142 Type 2 diabetes mellitus with diabetic polyneuropathy: Secondary | ICD-10-CM

## 2023-05-14 DIAGNOSIS — I5020 Unspecified systolic (congestive) heart failure: Secondary | ICD-10-CM

## 2023-05-14 DIAGNOSIS — I255 Ischemic cardiomyopathy: Secondary | ICD-10-CM

## 2023-05-14 DIAGNOSIS — I739 Peripheral vascular disease, unspecified: Secondary | ICD-10-CM

## 2023-05-14 DIAGNOSIS — I2510 Atherosclerotic heart disease of native coronary artery without angina pectoris: Secondary | ICD-10-CM

## 2023-05-14 DIAGNOSIS — E1122 Type 2 diabetes mellitus with diabetic chronic kidney disease: Secondary | ICD-10-CM

## 2023-05-14 DIAGNOSIS — I10 Essential (primary) hypertension: Secondary | ICD-10-CM

## 2023-05-23 ENCOUNTER — Other Ambulatory Visit: Payer: Self-pay | Admitting: Internal Medicine

## 2023-05-24 ENCOUNTER — Ambulatory Visit
Admission: RE | Admit: 2023-05-24 | Discharge: 2023-05-24 | Disposition: A | Discharge status: Home / Self Care | Source: Ambulatory Visit | Attending: Internal Medicine | Admitting: Internal Medicine

## 2023-05-24 DIAGNOSIS — D329 Benign neoplasm of meninges, unspecified: Secondary | ICD-10-CM

## 2023-05-24 MED ORDER — GADOPICLENOL 0.5 MMOL/ML IV SOLN
10.0000 mL | Freq: Once | INTRAVENOUS | Status: AC | PRN
  Administered 2023-05-24: 10 mL via INTRAVENOUS

## 2023-06-04 ENCOUNTER — Other Ambulatory Visit (HOSPITAL_BASED_OUTPATIENT_CLINIC_OR_DEPARTMENT_OTHER): Payer: Self-pay

## 2023-06-04 ENCOUNTER — Telehealth: Payer: Self-pay | Admitting: Internal Medicine
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
## 2023-06-11 ENCOUNTER — Encounter: Payer: Self-pay | Admitting: Internal Medicine

## 2023-06-19 ENCOUNTER — Other Ambulatory Visit: Payer: Self-pay | Admitting: Cardiovascular Disease

## 2023-06-21 DIAGNOSIS — H31093 Other chorioretinal scars, bilateral: Secondary | ICD-10-CM | POA: Diagnosis not present

## 2023-06-21 DIAGNOSIS — H35033 Hypertensive retinopathy, bilateral: Secondary | ICD-10-CM | POA: Diagnosis not present

## 2023-06-21 DIAGNOSIS — E113592 Type 2 diabetes mellitus with proliferative diabetic retinopathy without macular edema, left eye: Secondary | ICD-10-CM | POA: Diagnosis not present

## 2023-06-21 DIAGNOSIS — E113511 Type 2 diabetes mellitus with proliferative diabetic retinopathy with macular edema, right eye: Secondary | ICD-10-CM | POA: Diagnosis not present

## 2023-06-21 LAB — HM DIABETES EYE EXAM

## 2023-07-11 DIAGNOSIS — E1065 Type 1 diabetes mellitus with hyperglycemia: Secondary | ICD-10-CM | POA: Diagnosis not present

## 2023-07-17 ENCOUNTER — Encounter: Payer: Self-pay | Admitting: Internal Medicine

## 2023-07-17 ENCOUNTER — Ambulatory Visit: Payer: HMO | Admitting: Internal Medicine

## 2023-07-17 ENCOUNTER — Telehealth: Payer: Self-pay | Admitting: Internal Medicine

## 2023-07-17 VITALS — BP 128/72 | HR 80 | Ht 69.0 in | Wt 242.0 lb

## 2023-07-17 DIAGNOSIS — E1142 Type 2 diabetes mellitus with diabetic polyneuropathy: Secondary | ICD-10-CM | POA: Diagnosis not present

## 2023-07-17 DIAGNOSIS — I739 Peripheral vascular disease, unspecified: Secondary | ICD-10-CM

## 2023-07-17 DIAGNOSIS — Z0000 Encounter for general adult medical examination without abnormal findings: Secondary | ICD-10-CM | POA: Diagnosis not present

## 2023-07-17 DIAGNOSIS — S98132A Complete traumatic amputation of one left lesser toe, initial encounter: Secondary | ICD-10-CM

## 2023-07-17 DIAGNOSIS — I25119 Atherosclerotic heart disease of native coronary artery with unspecified angina pectoris: Secondary | ICD-10-CM

## 2023-07-17 DIAGNOSIS — I5022 Chronic systolic (congestive) heart failure: Secondary | ICD-10-CM | POA: Diagnosis not present

## 2023-07-17 DIAGNOSIS — I82409 Acute embolism and thrombosis of unspecified deep veins of unspecified lower extremity: Secondary | ICD-10-CM | POA: Diagnosis not present

## 2023-07-17 DIAGNOSIS — F3340 Major depressive disorder, recurrent, in remission, unspecified: Secondary | ICD-10-CM | POA: Diagnosis not present

## 2023-07-17 DIAGNOSIS — S98131A Complete traumatic amputation of one right lesser toe, initial encounter: Secondary | ICD-10-CM

## 2023-07-17 DIAGNOSIS — E113553 Type 2 diabetes mellitus with stable proliferative diabetic retinopathy, bilateral: Secondary | ICD-10-CM

## 2023-07-17 LAB — LIPID PANEL
Cholesterol: 132 mg/dL (ref 0–200)
HDL: 37.6 mg/dL — ABNORMAL LOW (ref >39.00)
LDL Cholesterol: 77 mg/dL (ref 0–99)
NonHDL: 94.22
Total CHOL/HDL Ratio: 4
Triglycerides: 84 mg/dL (ref 0.0–149.0)
VLDL: 16.8 mg/dL (ref 0.0–40.0)

## 2023-07-17 LAB — HEPATIC FUNCTION PANEL
ALT: 33 U/L (ref 0–53)
AST: 29 U/L (ref 0–37)
Albumin: 4.1 g/dL (ref 3.5–5.2)
Alkaline Phosphatase: 57 U/L (ref 39–117)
Bilirubin, Direct: 0.1 mg/dL (ref 0.0–0.3)
Total Bilirubin: 0.5 mg/dL (ref 0.2–1.2)
Total Protein: 6.9 g/dL (ref 6.0–8.3)

## 2023-07-17 LAB — HM DIABETES FOOT EXAM

## 2023-07-17 LAB — RENAL FUNCTION PANEL
Albumin: 4.1 g/dL (ref 3.5–5.2)
BUN: 18 mg/dL (ref 6–23)
CO2: 28 meq/L (ref 19–32)
Calcium: 9.4 mg/dL (ref 8.4–10.5)
Chloride: 102 meq/L (ref 96–112)
Creatinine, Ser: 1.2 mg/dL (ref 0.40–1.50)
GFR: 60.83 mL/min (ref >60.00)
Glucose, Bld: 181 mg/dL — ABNORMAL HIGH (ref 70–99)
Phosphorus: 3.4 mg/dL (ref 2.3–4.6)
Potassium: 4.5 meq/L (ref 3.5–5.1)
Sodium: 136 meq/L (ref 135–145)

## 2023-07-17 LAB — CBC
HCT: 42.7 % (ref 39.0–52.0)
Hemoglobin: 13.9 g/dL (ref 13.0–17.0)
MCHC: 32.6 g/dL (ref 30.0–36.0)
MCV: 84.2 fl (ref 78.0–100.0)
Platelets: 247 10*3/uL (ref 150.0–400.0)
RBC: 5.08 Mil/uL (ref 4.22–5.81)
RDW: 15.9 % — ABNORMAL HIGH (ref 11.5–15.5)
WBC: 7.3 10*3/uL (ref 4.0–10.5)

## 2023-07-17 LAB — MICROALBUMIN / CREATININE URINE RATIO
Creatinine,U: 62.4 mg/dL
Microalb Creat Ratio: 23.1 mg/g (ref 0.0–30.0)
Microalb, Ur: 1.4 mg/dL (ref 0.0–1.9)

## 2023-07-17 LAB — HEMOGLOBIN A1C: Hgb A1c MFr Bld: 6.4 % (ref 4.6–6.5)

## 2023-07-17 NOTE — Assessment & Plan Note (Signed)
 No changes

## 2023-07-17 NOTE — Progress Notes (Signed)
 Subjective:    Patient ID: Benjamin Davidson, male    DOB: May 09, 1950, 73 y.o.   MRN: [id number]  HPI Here for Medicare wellness visit and follow up of chronic health conditions Reviewed advanced directives Reviewed other doctors---Dr [facility] specialist, Dr Gollan--cardiology, Dr Diane Forte, Dr Mansouraty--GI No hospitalizations or surgery in the past year Not able to exercise Ongoing vision issues---keeps up Hearing is okay No alcohol or tobacco Did fall getting out of bed once---no injury Chronic mood issues Does do some shopping--not much. Is able to instrumental ADLs No sig memory issues  Checks sugars twice a day Usually under 110-120 in AM. Generally okay in the evening also (will hold evening insulin  if low) Takes 35 units of 70/30 bid  No recent hypoglycemic reactions--occ nausea or weak feeling (but not clearly low sugar) No neuropathy pain---decreased sensation only. Walks with cane  No recent pain Gets SOB with slight activity--may be some worse No palpitations No ankle swelling No dizziness or syncope  No recurrence of leg swelling/DVT  Depression fairly controlled No anxiety Does enjoy TV, talking with sister, etc  Current Outpatient Medications on File Prior to Visit  Medication Sig Dispense Refill   aspirin  EC 81 MG tablet Take 1 tablet (81 mg total) by mouth daily. 150 tablet 2   carvedilol  (COREG ) 6.25 MG tablet TAKE ONE TABLET BY MOUTH TWO TIMES DAILY 180 tablet 0   Cholecalciferol 25 MCG (1000 UT) tablet Take 1 tablet by mouth daily.     empagliflozin  (JARDIANCE ) 10 MG TABS tablet Take 1 tablet (10 mg total) by mouth daily. 30 tablet 6   ferrous gluconate  (FERGON) 324 MG tablet TAKE 1 TABLET BY MOUTH ONCE A DAY 30 tablet 0   furosemide  (LASIX ) 20 MG tablet TAKE 1 TABLET BY MOUTH ONCE A DAY. MAY TAKE ONE ADDITIONAL TABLET IF INCREASED SWELLING. 180 tablet 3   mupirocin  ointment (BACTROBAN ) 2 % Apply 1 application topically daily. 22 g 1    NOVOLIN  70/30 (70-30) 100 UNIT/ML injection INJECT 35 UNITS SUBCUTANEOUSLY TWICE A DAY AS DIRECTED 20 mL 11   potassium chloride  (KLOR-CON ) 10 MEQ tablet Take 1 tablet (10 mEq total) by mouth daily. 90 tablet 1   rivaroxaban  (XARELTO ) 10 MG TABS tablet TAKE ONE TABLET BY MOUTH ONCE A DAY 90 tablet 3   rosuvastatin  (CRESTOR ) 20 MG tablet TAKE ONE TABLET BY MOUTH ONCE A DAY 90 tablet 3   sacubitril-valsartan (ENTRESTO ) 49-51 MG TAKE ONE TABLET BY MOUTH TWO TIMES DAILY 60 tablet 11   sertraline  (ZOLOFT ) 100 MG tablet TAKE ONE TABLET BY MOUTH ONCE DAILY 90 tablet 3   triamcinolone  cream (KENALOG ) 0.1 % Apply 1 application topically 2 (two) times daily as needed. 45 g 1   TRUEPLUS INSULIN  SYRINGE 31G X 5/16 0.5 ML MISC USE AS DIRECTED TWICE DAILY 100 each 12   No current facility-administered medications on file prior to visit.    Allergies  Allergen Reactions   Losartan  Other (See Comments)    Not an allergy -  Not an allergy -   Flu-like symptoms   Lisinopril     Cough Flu like symptoms     Past Medical History:  Diagnosis Date   Anemia    hx of   Arthritis    Asthma    childhood   Cataract    CHF (congestive heart failure) (HCC)    SHOB at times   Coronary artery disease    Diabetic peripheral neuropathy associated with type 2 diabetes  mellitus (HCC)    DM (diabetes mellitus), type 2, uncontrolled, periph vascular complic    Type II   Edema    feet/legs   Hx of colonic polyps    Hypertension    Kidney stones    Left leg DVT (HCC)    2000s   MDD (recurrent major depressive disorder) in remission (HCC)    on meds   Mitral regurgitation 08/10/2011   Peripheral vascular disease (HCC)    Left toe amputations. Dr Vonna Guardian   Pulmonary embolism (HCC) 09/05/2005   Pure hypercholesterolemia    Shortness of breath dyspnea    Syncope    resolved   Wears glasses     Past Surgical History:  Procedure Laterality Date   AMPUTATION TOE Left 03/17/2017   Procedure: FIRST RAY  RESECTION LEFT FOOT;  Surgeon: Angel Barba, DPM;  Location: [facility] ORS;  Service: Podiatry;  Laterality: Left;   BIOPSY  06/18/2019   Procedure: BIOPSY;  Surgeon: Brice Campi Albino Alu., MD;  Location: [facility] ENDOSCOPY;  Service: Gastroenterology;;   CARDIAC CATHETERIZATION     CATARACT EXTRACTION W/PHACO Left 06/03/2015   Procedure: CATARACT EXTRACTION PHACO AND INTRAOCULAR LENS PLACEMENT (IOC);  Surgeon: Rosa College, MD;  Location: [facility] ORS;  Service: Ophthalmology;  Laterality: Left;  Lot# [id number] H US : 1.06 AP%: 5.5 CDE: 3.69   CATARACT EXTRACTION W/PHACO Right 12/10/2017   Procedure: Aborted phacoemusification of the right eye.  No intraocular lens was implated.;  Surgeon: Rosa College, MD;  Location: [facility]ERY [facility];  Service: Ophthalmology;  Laterality: Right;  Diabetic - insulin    COLONOSCOPY WITH PROPOFOL  N/A 06/18/2019   Procedure: COLONOSCOPY WITH PROPOFOL ;  Surgeon: Brice Campi Albino Alu., MD;  Location: [facility] ENDOSCOPY;  Service: Gastroenterology;  Laterality: N/A;   CORONARY ARTERY BYPASS GRAFT  2008   ENDOSCOPIC MUCOSAL RESECTION N/A 06/18/2019   Procedure: ENDOSCOPIC MUCOSAL RESECTION;  Surgeon: Brice Campi Albino Alu., MD;  Location: [facility] ENDOSCOPY;  Service: Gastroenterology;  Laterality: N/A;   ESOPHAGOGASTRODUODENOSCOPY (EGD) WITH PROPOFOL  N/A 06/18/2019   Procedure: ESOPHAGOGASTRODUODENOSCOPY (EGD) WITH PROPOFOL ;  Surgeon: Brice Campi Albino Alu., MD;  Location: [facility] ENDOSCOPY;  Service: Gastroenterology;  Laterality: N/A;   EYE SURGERY     HEMOSTASIS CLIP PLACEMENT  06/18/2019   Procedure: HEMOSTASIS CLIP PLACEMENT;  Surgeon: Normie Becton., MD;  Location: [facility] ENDOSCOPY;  Service: Gastroenterology;;   PARS PLANA VITRECTOMY Right 02/17/2015   Procedure: PARS PLANA VITRECTOMY WITH 25 GAUGE, endolaser, right eye;  Surgeon: Stephenie Einstein, MD;  Location: [facility] ORS;  Service: Ophthalmology;  Laterality: Right;  fluid pack lot # [id number] H endolaser: 200w, 1439p    PERCUTANEOUS STENT INTERVENTION     right leg   PERIPHERAL VASCULAR BALLOON ANGIOPLASTY Left 03/20/2017   Procedure: PERIPHERAL VASCULAR BALLOON ANGIOPLASTY;  Surgeon: Jackquelyn Mass, MD;  Location: [facility] INVASIVE CV LAB;  Service: Cardiovascular;  Laterality: Left;   PERIPHERALLY INSERTED CENTRAL CATHETER INSERTION  2013   POLYPECTOMY  06/18/2019   Procedure: POLYPECTOMY;  Surgeon: Mansouraty, Albino Alu., MD;  Location: [facility] END[facility];  Service: Gastroenterology;;   SUBMUCOSAL LIFTING INJECTION  06/18/2019   Procedure: SUBMUCOSAL LIFTING INJECTION;  Surgeon: Normie Becton., MD;  Location: [facility] ENDOSCOPY;  Service: Gastroenterology;;   TOE AMPUTATION     left 5th, right 4th and 5th    Family History  Problem Relation Age of Onset   Heart disease Sister    Aneurysm Sister    CAD Brother    Hyperlipidemia Brother    Colon cancer Neg Hx    Esophageal  cancer Neg Hx    Inflammatory bowel disease Neg Hx    Liver disease Neg Hx    Pancreatic cancer Neg Hx    Rectal cancer Neg Hx    Stomach cancer Neg Hx    Colon polyps Neg Hx     Social History   Socioeconomic History   Marital status: Divorced    Spouse name: Not on file   Number of children: 0   Years of education: Not on file   Highest education level: Not on file  Occupational History   Occupation: Systems analyst    Comment: Disabled since 2000's  Tobacco Use   Smoking status: Never    Passive exposure: Past   Smokeless tobacco: Never  Vaping Use   Vaping status: Never Used  Substance and Sexual Activity   Alcohol use: No    Alcohol/week: 0.0 standard drinks of alcohol   Drug use: No   Sexual activity: Not on file  Other Topics Concern   Not on file  Social History Narrative   Has living will   Benjamin Davidson, sister, as health care POA.   Would accept resuscitation attempts   Not sure about tube feeds   Social Drivers of Health   Financial Resource Strain: Low Risk  (03/24/2022)   Overall Financial  Resource Strain (CARDIA)    Difficulty of Paying Living Expenses: Not very hard  Food Insecurity: Not on file  Transportation Needs: No Transportation Needs (03/24/2022)   PRAPARE - Transportation    Lack of Transportation (Medical): No    Lack of Transportation (Non-Medical): No  Physical Activity: Not on file  Stress: No Stress Concern Present (08/16/2020)   Received from [facility] - Occupational Stress Questionnaire    Feeling of Stress : Not at all  Social Connections: Socially Isolated (03/24/2022)   Social Connection and Isolation Panel    Frequency of Communication with Friends and Family: Once a week    Frequency of Social Gatherings with Friends and Family: Once a week    Attends Religious Services: Never    Database administrator or Organizations: No    Attends Banker Meetings: Never    Marital Status: Divorced  Catering manager Violence: Unknown (05/03/2021)   Received from [facility]   HITS    Physically Hurt: Not on file    Insult or Talk Down To: Not on file    Threaten Physical Harm: Not on file    Scream or Curse: Not on file   Review of Systems Has lost 20# since last visit---has improved his eating (cut out bread, etc) Sleeps okay Wears seat belt Teeth okay---overdue for dentist  No suspicious skin lesions--no derm No heartburn or dysphagia Bowels move fine--no blood No trouble voiding Some back/joint pains--tylenol  prn    Objective:   Physical Exam Constitutional:      Appearance: Normal appearance.  HENT:     Mouth/Throat:     Pharynx: No oropharyngeal exudate or posterior oropharyngeal erythema.   Eyes:     Conjunctiva/sclera: Conjunctivae normal.     Pupils: Pupils are equal, round, and reactive to light.    Cardiovascular:     Rate and Rhythm: Normal rate and regular rhythm.     Heart sounds: No murmur heard.    No gallop.     Comments: Feet warm with faint pedal  pulses Pulmonary:     Effort: Pulmonary effort is normal.  Breath sounds: Normal breath sounds. No wheezing or rales.  Abdominal:     Palpations: Abdomen is soft.     Tenderness: There is no abdominal tenderness.   Musculoskeletal:     Cervical back: Neck supple.     Right lower leg: No edema.     Left lower leg: No edema.     Comments: Amputations --left great and 5th toes Right--4th and 5th  Lymphadenopathy:     Cervical: No cervical adenopathy.   Skin:    Findings: No lesion or rash.   Neurological:     General: No focal deficit present.     Mental Status: He is alert and oriented to person, place, and time.     Comments: Little sensation in feet Mini--cog --normal  Psychiatric:        Mood and Affect: Mood normal.        Behavior: Behavior normal.            Assessment & Plan:

## 2023-07-17 NOTE — Assessment & Plan Note (Signed)
 Hopefully still well controlled 70/30 35 units bid and jardaince 10

## 2023-07-17 NOTE — Assessment & Plan Note (Signed)
 Chronic but controlled On sertraline  100mg  daily

## 2023-07-17 NOTE — Telephone Encounter (Signed)
Please call patient to schedule 6 month follow up. Thank you.

## 2023-07-17 NOTE — Telephone Encounter (Signed)
 Called pt and schedule appt for 6 MONTH FLU

## 2023-07-17 NOTE — Assessment & Plan Note (Signed)
 Stable DOE--no recent chest pain On entresto , rosuvastatin  20, carvedilol  6.25 bid, jardiance  10

## 2023-07-17 NOTE — Assessment & Plan Note (Signed)
 Compensated on heart meds and furosemide  20mg  daily

## 2023-07-17 NOTE — Assessment & Plan Note (Signed)
 No recurrence on daily xarelto  10mg 

## 2023-07-17 NOTE — Telephone Encounter (Signed)
 Pt requested to transfer his care to Dr. Vallarie Gauze once Dr. Tim Fontana? Per Dr. Letvak, pt needs a 6 month f/u in Dec. Is this toc okay? Call back # [phone]

## 2023-07-17 NOTE — Assessment & Plan Note (Signed)
Continues with retina specialist

## 2023-07-17 NOTE — Telephone Encounter (Signed)
Okay with me. Thanks 

## 2023-07-17 NOTE — Telephone Encounter (Signed)
-----   Message from Curt Dover sent at 07/17/2023 12:29 PM EDT ----- 6 months for follow up ---requests Dr Vallarie Gauze (if he can't take him, please ask Dr Crissie Dome)

## 2023-07-17 NOTE — Assessment & Plan Note (Signed)
 Has lost 20# and only just over BMI of 35 Multiple comorbities

## 2023-07-17 NOTE — Assessment & Plan Note (Signed)
 I have personally reviewed the Medicare Annual Wellness questionnaire and have noted 1. The patient's medical and social history 2. Their use of alcohol, tobacco or illicit drugs 3. Their current medications and supplements 4. The patient's functional ability including ADL's, fall risks, home safety risks and hearing or visual             impairment. 5. Diet and physical activities 6. Evidence for depression or mood disorders  The patients weight, height, BMI and visual acuity have been recorded in the chart I have made referrals, counseling and provided education to the patient based review of the above and I have provided the pt with a written personalized care plan for preventive services.  I have provided you with a copy of your personalized plan for preventive services. Please take the time to review along with your updated medication list.  Colon due again next year Done with PSA testing due to age Not able to exercise Thinks he had shingrix Prefers no COVID vaccines Flu/RSV in the fall

## 2023-07-17 NOTE — Addendum Note (Signed)
 Addended by: Curt Dover I on: 07/17/2023 12:30 PM   Modules accepted: Orders

## 2023-07-17 NOTE — Assessment & Plan Note (Signed)
 No claudication but not much walking

## 2023-07-18 ENCOUNTER — Ambulatory Visit: Payer: Self-pay | Admitting: Internal Medicine

## 2023-07-23 NOTE — Progress Notes (Unsigned)
 Date:  07/24/2023   ID:  Benjamin Davidson, DOB 09/20/1950, MRN [id number]  Patient Location:  [address] [place] [address]   Provider location:   [facility], [place] office  PCP:  Jimmy Charlie FERNS, MD  Cardiologist:  Perla MOCCASIN [facility]  Chief Complaint  Patient presents with   Follow-up    Pt c/o of tired more than usual and has shortness of breath daily.     History of Present Illness:    Benjamin Davidson is a 73 y.o. 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,  diabetes,  A1C 7 sedentary lifestyle,   history of DVT in the left leg,  on xarelto  Carotid disease <39% b/l who presents for routine followup of his coronary artery disease, peripheral arterial disease  Last seen by myself in the office 7/24  Feels tired, did not sleep well last night Sedentary, does not go to [employer] No exercise program  A1c improving 7 down to 6 Cut bread out,  Still eating some cookies, ice cream  Denies significant chest pain concerning for angina No PND orthopnea no significant leg swelling  He is declining follow-up with vascular, declining lower extremity arterial ABIs and Dopplers Last performed in 2020  Echocardiogram February 27, 2022 EF 40 to 45% Up from prior study June 2023 EF 35 to 40%  Stress test 6/23 showing fixed defect mid to apical anterior, anteroseptal wall  Labs reviewed A1C 6.4 Total chol 132, LDL 77  Side effects on spironolactone  (added 11/23 office visit)  balance problem, stopped it  EKG personally reviewed by myself on todays visit EKG Interpretation Date/Time:  Tuesday July 24 2023 08:48:24 EDT Ventricular Rate:  76 PR Interval:  176 QRS Duration:  146 QT Interval:  432 QTC Calculation: 486 R Axis:   16  Text Interpretation: Normal sinus rhythm Left bundle branch block When compared with ECG of 21-Aug-2022 11:37, No significant change was found Confirmed by  Perla Lye [phone]) on 07/24/2023 8:53:01 AM   Other past medical history reviewed  hospital admission February 2019 Left great toe osteomyelitis with ulceration Amputation left great toe,  blister that never healed   Vascular surgery March 20, 2017 Angioplasty of left posterior tibial Angioplasty left peroneal artery  History of osteomyelitis requiring wound VAC on his right foot, 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 He had gangrene of the fifth toe on the right with acute renal failure and dehydration admitted 08/07/2011  last stress test July 2008 showing mild ischemia in the mid anteroseptal, apical anterior and apical septal region.   Past Medical History:  Diagnosis Date   Anemia    hx of   Arthritis    Asthma    childhood   Cataract    CHF (congestive heart failure) (HCC)    SHOB at times   Coronary artery disease    Diabetic peripheral neuropathy associated with type 2 diabetes mellitus (HCC)    DM (diabetes mellitus), type 2, uncontrolled, periph vascular complic    Type II   Edema    feet/legs   Hx of colonic polyps    Hypertension    Kidney stones    Left leg DVT (HCC)    2000s   MDD (recurrent major depressive disorder) in remission (HCC)    on meds   Mitral regurgitation 08/10/2011   Peripheral vascular disease (HCC)  Left toe amputations. Dr Marea   Pulmonary embolism (HCC) 09/05/2005   Pure hypercholesterolemia    Shortness of breath dyspnea    Syncope    resolved   Wears glasses    Past Surgical History:  Procedure Laterality Date   AMPUTATION TOE Left 03/17/2017   Procedure: FIRST RAY RESECTION LEFT FOOT;  Surgeon: Neill Boas, DPM;  Location: [facility] ORS;  Service: Podiatry;  Laterality: Left;   BIOPSY  06/18/2019   Procedure: BIOPSY;  Surgeon: Wilhelmenia Aloha Raddle., MD;  Location: [facility] END[facility];  Service: Gastroenterology;;   CARDIAC CATHETERIZATION     CATARACT EXTRACTION W/PHACO Left 06/03/2015   Procedure:  CATARACT EXTRACTION PHACO AND INTRAOCULAR LENS PLACEMENT (IOC);  Surgeon: Adine Oneil Novak, MD;  Location: [facility] ORS;  Service: Ophthalmology;  Laterality: Left;  Lot# [id number] H US : 1.06 AP%: 5.5 CDE: 3.69   CATARACT EXTRACTION W/PHACO Right 12/10/2017   Procedure: Aborted phacoemusification of the right eye.  No intraocular lens was implated.;  Surgeon: Novak Adine Oneil, MD;  Location: [facility];  Service: Ophthalmology;  Laterality: Right;  Diabetic - insulin    COLONOSCOPY WITH PROPOFOL  N/A 06/18/2019   Procedure: COLONOSCOPY WITH PROPOFOL ;  Surgeon: Mansouraty, Aloha Raddle., MD;  Location: [facility] ENDOSCOPY;  Service: Gastroenterology;  Laterality: N/A;   CORONARY ARTERY BYPASS GRAFT  2008   ENDOSCOPIC MUCOSAL RESECTION N/A 06/18/2019   Procedure: ENDOSCOPIC MUCOSAL RESECTION;  Surgeon: Wilhelmenia Aloha Raddle., MD;  Location: [facility] ENDOSCOPY;  Service: Gastroenterology;  Laterality: N/A;   ESOPHAGOGASTRODUODENOSCOPY (EGD) WITH PROPOFOL  N/A 06/18/2019   Procedure: ESOPHAGOGASTRODUODENOSCOPY (EGD) WITH PROPOFOL ;  Surgeon: Wilhelmenia Aloha Raddle., MD;  Location: [facility] ENDOSCOPY;  Service: Gastroenterology;  Laterality: N/A;   EYE SURGERY     HEMOSTASIS CLIP PLACEMENT  06/18/2019   Procedure: HEMOSTASIS CLIP PLACEMENT;  Surgeon: Wilhelmenia Aloha Raddle., MD;  Location: [facility] ENDOSCOPY;  Service: Gastroenterology;;   PARS PLANA VITRECTOMY Right 02/17/2015   Procedure: PARS PLANA VITRECTOMY WITH 25 GAUGE, endolaser, right eye;  Surgeon: Harlene Scarce, MD;  Location: [facility] ORS;  Service: Ophthalmology;  Laterality: Right;  fluid pack lot # [id number] H endolaser: 200w, 1439p   PERCUTANEOUS STENT INTERVENTION     right leg   PERIPHERAL VASCULAR BALLOON ANGIOPLASTY Left 03/20/2017   Procedure: PERIPHERAL VASCULAR BALLOON ANGIOPLASTY;  Surgeon: Jama Cordella MATSU, MD;  Location: [facility] INVASIVE CV LAB;  Service: Cardiovascular;  Laterality: Left;   PERIPHERALLY INSERTED CENTRAL CATHETER INSERTION  2013    POLYPECTOMY  06/18/2019   Procedure: POLYPECTOMY;  Surgeon: Mansouraty, Aloha Raddle., MD;  Location: [facility];  Service: Gastroenterology;;   SUBMUCOSAL LIFTING INJECTION  06/18/2019   Procedure: SUBMUCOSAL LIFTING INJECTION;  Surgeon: Wilhelmenia Aloha Raddle., MD;  Location: [facility] ENDOSCOPY;  Service: Gastroenterology;;   TOE AMPUTATION     left 5th, right 4th and 5th    Current Outpatient Medications on File Prior to Visit  Medication Sig Dispense Refill   aspirin  EC 81 MG tablet Take 1 tablet (81 mg total) by mouth daily. 150 tablet 2   carvedilol  (COREG ) 6.25 MG tablet TAKE ONE TABLET BY MOUTH TWO TIMES DAILY 180 tablet 0   Cholecalciferol 25 MCG (1000 UT) tablet Take 1 tablet by mouth daily.     empagliflozin  (JARDIANCE ) 10 MG TABS tablet Take 1 tablet (10 mg total) by mouth daily. 30 tablet 6   ferrous gluconate  (FERGON) 324 MG tablet TAKE 1 TABLET BY MOUTH ONCE A DAY 30 tablet 0   furosemide  (LASIX ) 20 MG tablet TAKE 1 TABLET BY MOUTH ONCE A DAY. MAY  TAKE ONE ADDITIONAL TABLET IF INCREASED SWELLING. 180 tablet 3   mupirocin  ointment (BACTROBAN ) 2 % Apply 1 application topically daily. (Patient taking differently: Apply 1 application  topically as needed.) 22 g 1   NOVOLIN  70/30 (70-30) 100 UNIT/ML injection INJECT 35 UNITS SUBCUTANEOUSLY TWICE A DAY AS DIRECTED 20 mL 11   potassium chloride  (KLOR-CON ) 10 MEQ tablet Take 1 tablet (10 mEq total) by mouth daily. 90 tablet 1   rivaroxaban  (XARELTO ) 10 MG TABS tablet TAKE ONE TABLET BY MOUTH ONCE A DAY 90 tablet 3   rosuvastatin  (CRESTOR ) 20 MG tablet TAKE ONE TABLET BY MOUTH ONCE A DAY 90 tablet 3   sacubitril-valsartan (ENTRESTO ) 49-51 MG TAKE ONE TABLET BY MOUTH TWO TIMES DAILY 60 tablet 11   sertraline  (ZOLOFT ) 100 MG tablet TAKE ONE TABLET BY MOUTH ONCE DAILY 90 tablet 3   TRUEPLUS INSULIN  SYRINGE 31G X 5/16 0.5 ML MISC USE AS DIRECTED TWICE DAILY 100 each 12   triamcinolone  cream (KENALOG ) 0.1 % Apply 1 application topically 2 (two)  times daily as needed. (Patient not taking: Reported on 07/24/2023) 45 g 1   No current facility-administered medications on file prior to visit.    Allergies:   Losartan  and Lisinopril   Social History   Tobacco Use   Smoking status: Never    Passive exposure: Past   Smokeless tobacco: Never  Vaping Use   Vaping status: Never Used  Substance Use Topics   Alcohol use: No    Alcohol/week: 0.0 standard drinks of alcohol   Drug use: No    Family Hx: The patient's family history includes Aneurysm in his sister; CAD in his brother; Heart disease in his sister; Hyperlipidemia in his brother. There is no history of Colon cancer, Esophageal cancer, Inflammatory bowel disease, Liver disease, Pancreatic cancer, Rectal cancer, Stomach cancer, or Colon polyps.  ROS:   Please see the history of present illness.    Review of Systems  Constitutional: Negative.   HENT: Negative.    Respiratory:  Positive for shortness of breath.   Cardiovascular: Negative.   Gastrointestinal: Negative.   Musculoskeletal: Negative.   Neurological: Negative.   Psychiatric/Behavioral: Negative.    All other systems reviewed and are negative.    Labs/Other Tests and Data Reviewed:    Recent Labs: 07/17/2023: ALT 33; BUN 18; Creatinine, Ser 1.20; Hemoglobin 13.9; Platelets 247.0; Potassium 4.5; Sodium 136   Recent Lipid Panel Lab Results  Component Value Date/Time   CHOL 132 07/17/2023 12:55 PM   TRIG 84.0 07/17/2023 12:55 PM   HDL 37.60 (L) 07/17/2023 12:55 PM   CHOLHDL 4 07/17/2023 12:55 PM   LDLCALC 77 07/17/2023 12:55 PM    Wt Readings from Last 3 Encounters:  07/24/23 246 lb 8 oz (111.8 kg)  07/17/23 242 lb (109.8 kg)  01/15/23 260 lb (117.9 kg)     Exam:    BP 90/60 (BP Location: Left Arm, Patient Position: Sitting, Cuff Size: Normal)   Pulse 76   Ht 5' 9.5 (1.765 m)   Wt 246 lb 8 oz (111.8 kg)   SpO2 98%   BMI 35.88 kg/m  Constitutional:  oriented to person, place, and time. No  distress.  HENT:  Head: Grossly normal Eyes:  no discharge. No scleral icterus.  Neck: No JVD, no carotid bruits  Cardiovascular: Regular rate and rhythm, no murmurs appreciated Pulmonary/Chest: Clear to auscultation bilaterally, no wheezes or rails Abdominal: Soft.  no distension.  no tenderness.  Musculoskeletal: Normal range of motion  Neurological:  normal muscle tone. Coordination normal. No atrophy Skin: Skin warm and dry Psychiatric: normal affect, pleasant  ASSESSMENT & PLAN:    PAD (peripheral artery disease) (HCC) Previously followed by vascular, dr. Dreama Denies claudication type symptoms, sedentary at baseline Declining lower extremity ABIs and arterial Dopplers  History of DVT in leg On xarelto  prophylactic dose  Morbid obesity (HCC) Weight continues to run high Recommended continued dietary changes, restart exercise program at [facility]  Type 2 diabetes, controlled, with peripheral neuropathy (HCC) A1c trending lower through cutting out on his bread Recommend walking program  Cardiomyopathy History of coronary disease, CABG, previously declining cardiac catheterization Low risk stress test with fixed defect  continue Entresto , carvedilol , Jardiance , Lasix  For systolic pressure less than 100 recommend he hold day of Lasix   did not tolerate spironolactone  secondary to gait instability  Pure hypercholesterolemia Cholesterol is at goal on the current lipid regimen. No changes to the medications were made.  Essential hypertension Blood pressure is well controlled on today's visit. No changes made to the medications.  Atherosclerosis of native coronary artery of native heart with stable angina pectoris (HCC) Currently with no symptoms of angina. No further workup at this time. Continue current medication regimen. Ejection fraction 40 to 45% Previously declined cardiac catheterization Recommend exercise program  Shortness of breath Reports longstanding  issue, likely from deconditioning, obesity, low ejection fraction Recommend low impact exercise   Signed, Evalene Lunger, MD  07/24/2023 8:53 AM    [facility] [address], [place], [place] [address]

## 2023-07-24 ENCOUNTER — Encounter: Payer: Self-pay | Admitting: Cardiovascular Disease

## 2023-07-24 ENCOUNTER — Ambulatory Visit: Attending: Cardiovascular Disease | Admitting: Cardiovascular Disease

## 2023-07-24 VITALS — BP 90/60 | HR 76 | Ht 69.5 in | Wt 246.5 lb

## 2023-07-24 DIAGNOSIS — E1142 Type 2 diabetes mellitus with diabetic polyneuropathy: Secondary | ICD-10-CM | POA: Diagnosis not present

## 2023-07-24 DIAGNOSIS — I5022 Chronic systolic (congestive) heart failure: Secondary | ICD-10-CM

## 2023-07-24 DIAGNOSIS — I10 Essential (primary) hypertension: Secondary | ICD-10-CM

## 2023-07-24 DIAGNOSIS — E7800 Pure hypercholesterolemia, unspecified: Secondary | ICD-10-CM

## 2023-07-24 DIAGNOSIS — E1122 Type 2 diabetes mellitus with diabetic chronic kidney disease: Secondary | ICD-10-CM

## 2023-07-24 DIAGNOSIS — I739 Peripheral vascular disease, unspecified: Secondary | ICD-10-CM

## 2023-07-24 DIAGNOSIS — I5020 Unspecified systolic (congestive) heart failure: Secondary | ICD-10-CM | POA: Diagnosis not present

## 2023-07-24 DIAGNOSIS — I255 Ischemic cardiomyopathy: Secondary | ICD-10-CM

## 2023-07-24 DIAGNOSIS — I2510 Atherosclerotic heart disease of native coronary artery without angina pectoris: Secondary | ICD-10-CM

## 2023-07-24 DIAGNOSIS — I25119 Atherosclerotic heart disease of native coronary artery with unspecified angina pectoris: Secondary | ICD-10-CM | POA: Diagnosis not present

## 2023-07-24 DIAGNOSIS — N183 Chronic kidney disease, stage 3 unspecified: Secondary | ICD-10-CM

## 2023-07-24 MED ORDER — FUROSEMIDE 20 MG PO TABS: ORAL_TABLET | ORAL | 3 refills | Status: AC

## 2023-07-24 MED ORDER — EMPAGLIFLOZIN 10 MG PO TABS: 10.0000 mg | ORAL_TABLET | Freq: Every day | ORAL | 3 refills | Status: AC

## 2023-07-24 MED ORDER — CARVEDILOL 6.25 MG PO TABS: 6.2500 mg | ORAL_TABLET | Freq: Two times a day (BID) | ORAL | 3 refills | Status: AC

## 2023-07-24 MED ORDER — ROSUVASTATIN CALCIUM 20 MG PO TABS: 20.0000 mg | ORAL_TABLET | Freq: Every day | ORAL | 3 refills | Status: AC

## 2023-07-24 NOTE — Patient Instructions (Addendum)
 Medication Instructions:  No changes  For low pressure <100, Hold lasix  1-2 x a week is ok  If you need a refill on your cardiac medications before your next appointment, please call your pharmacy.   Lab work: No new labs needed  Testing/Procedures: No new testing needed  Follow-Up: At [facility], you and your health needs are our priority.  As part of our continuing mission to provide you with exceptional heart care, we have created designated Provider Care Teams.  These Care Teams include your primary Cardiologist (physician) and Advanced Practice Providers (APPs -  Physician Assistants and Nurse Practitioners) who all work together to provide you with the care you need, when you need it.  You will need a follow up appointment in 6 months with APP  Providers on your designated Care Team:   Lonni Meager, NP Bernardino Bring, PA-C Cadence Franchester, [place]  COVID-19 Vaccine Information can be found at: [email or web address] For questions related to vaccine distribution or appointments, please email [email or web address] or call [phone].

## 2023-08-09 DIAGNOSIS — E113511 Type 2 diabetes mellitus with proliferative diabetic retinopathy with macular edema, right eye: Secondary | ICD-10-CM | POA: Diagnosis not present

## 2023-08-16 ENCOUNTER — Other Ambulatory Visit: Payer: Self-pay | Admitting: Cardiovascular Disease

## 2023-09-05 ENCOUNTER — Other Ambulatory Visit: Payer: Self-pay | Admitting: Internal Medicine

## 2023-09-10 ENCOUNTER — Telehealth: Payer: Self-pay

## 2023-09-10 NOTE — Telephone Encounter (Signed)
 Spoke to pt. He said he is able to get on his MyChart. He will let us  know if there is an issue so we can print it.

## 2023-09-10 NOTE — Telephone Encounter (Signed)
 Copied from CRM [id number]. Topic: General - Other >> Sep 10, 2023  1:38 PM Frederich PARAS wrote: Reason for CRM: PT Brother in law called with pt on the line, he says the payee representative passed away, causing his payments to be suspended until he can get it figured out, they need a   letter from pcp stating Benjamin Davidson is capable in medical opinions that he is cpable of handeling his own financial affairs . Benjamin Davidson has an apt on 8/26th with social security office  Callback # is [phone]

## 2023-09-27 DIAGNOSIS — E1065 Type 1 diabetes mellitus with hyperglycemia: Secondary | ICD-10-CM | POA: Diagnosis not present

## 2023-10-08 DIAGNOSIS — H31093 Other chorioretinal scars, bilateral: Secondary | ICD-10-CM | POA: Diagnosis not present

## 2023-10-08 DIAGNOSIS — H35033 Hypertensive retinopathy, bilateral: Secondary | ICD-10-CM | POA: Diagnosis not present

## 2023-10-08 DIAGNOSIS — E113511 Type 2 diabetes mellitus with proliferative diabetic retinopathy with macular edema, right eye: Secondary | ICD-10-CM | POA: Diagnosis not present

## 2023-10-08 DIAGNOSIS — E113592 Type 2 diabetes mellitus with proliferative diabetic retinopathy without macular edema, left eye: Secondary | ICD-10-CM | POA: Diagnosis not present

## 2023-10-08 DIAGNOSIS — Z961 Presence of intraocular lens: Secondary | ICD-10-CM | POA: Diagnosis not present

## 2023-11-08 DIAGNOSIS — E113593 Type 2 diabetes mellitus with proliferative diabetic retinopathy without macular edema, bilateral: Secondary | ICD-10-CM | POA: Diagnosis not present

## 2023-11-08 DIAGNOSIS — Z961 Presence of intraocular lens: Secondary | ICD-10-CM | POA: Diagnosis not present

## 2023-11-08 DIAGNOSIS — H1820 Unspecified corneal edema: Secondary | ICD-10-CM | POA: Diagnosis not present

## 2023-11-08 LAB — OPHTHALMOLOGY REPORT-SCANNED

## 2023-12-17 DIAGNOSIS — H31093 Other chorioretinal scars, bilateral: Secondary | ICD-10-CM | POA: Diagnosis not present

## 2023-12-17 DIAGNOSIS — Z961 Presence of intraocular lens: Secondary | ICD-10-CM | POA: Diagnosis not present

## 2023-12-17 DIAGNOSIS — E113511 Type 2 diabetes mellitus with proliferative diabetic retinopathy with macular edema, right eye: Secondary | ICD-10-CM | POA: Diagnosis not present

## 2023-12-17 DIAGNOSIS — H35033 Hypertensive retinopathy, bilateral: Secondary | ICD-10-CM | POA: Diagnosis not present

## 2023-12-17 DIAGNOSIS — E113592 Type 2 diabetes mellitus with proliferative diabetic retinopathy without macular edema, left eye: Secondary | ICD-10-CM | POA: Diagnosis not present

## 2023-12-18 DIAGNOSIS — E1065 Type 1 diabetes mellitus with hyperglycemia: Secondary | ICD-10-CM | POA: Diagnosis not present

## 2023-12-20 ENCOUNTER — Other Ambulatory Visit: Payer: Self-pay

## 2023-12-20 MED ORDER — SERTRALINE HCL 100 MG PO TABS
100.0000 mg | ORAL_TABLET | Freq: Every day | ORAL | 0 refills | Status: AC
Start: 2023-12-20 — End: ?

## 2024-01-17 ENCOUNTER — Encounter: Payer: Self-pay | Admitting: Family Medicine

## 2024-01-17 ENCOUNTER — Ambulatory Visit: Payer: Self-pay | Admitting: Family Medicine

## 2024-01-17 ENCOUNTER — Ambulatory Visit: Admitting: Family Medicine

## 2024-01-17 VITALS — BP 112/82 | HR 71 | Temp 97.6°F | Ht 69.5 in | Wt 256.0 lb

## 2024-01-17 DIAGNOSIS — E1142 Type 2 diabetes mellitus with diabetic polyneuropathy: Secondary | ICD-10-CM

## 2024-01-17 DIAGNOSIS — Z7901 Long term (current) use of anticoagulants: Secondary | ICD-10-CM | POA: Diagnosis not present

## 2024-01-17 DIAGNOSIS — Z23 Encounter for immunization: Secondary | ICD-10-CM

## 2024-01-17 DIAGNOSIS — Z7189 Other specified counseling: Secondary | ICD-10-CM | POA: Diagnosis not present

## 2024-01-17 DIAGNOSIS — Z7984 Long term (current) use of oral hypoglycemic drugs: Secondary | ICD-10-CM | POA: Diagnosis not present

## 2024-01-17 LAB — POCT GLYCOSYLATED HEMOGLOBIN (HGB A1C): Hemoglobin A1C: 5.9 % — AB (ref 4.0–5.6)

## 2024-01-17 NOTE — Patient Instructions (Signed)
 Please check your sugar in pairs- before and then 2 hours after a meal.    Please fill out the grid and send me that.  We'll go from there.    If you have low sugars then let me know.  Take care.  Glad to see you.

## 2024-01-17 NOTE — Progress Notes (Unsigned)
 Diabetes:  Using medications without difficulties:yes Hypoglycemic episodes:no Hyperglycemic episodes:no Feet problems: some tingling in the R foot.  Blood Sugars averaging: usually ~100 in the AMs.  eye exam within last year: yes A1c 5.9.  Jardiance  10mg  and 70/30 insulin  35 units BID, occ skipping PM dose if sugar is lower.  Sugar was 89 this AM.  Has been up to 200s- but that is rare.    Still on xarelto  with h/o DVT noted.   Barnie Sakai, sister, as health care POA.  Meds, vitals, and allergies reviewed.   ROS: Per HPI unless specifically indicated in ROS section   GEN: nad, alert and oriented HEENT: mucous membranes moist NECK: supple w/o LA CV: rrr. PULM: ctab, no inc wob ABD: soft, +bs EXT: no edema SKIN: no acute rash  Diabetic foot exam: Normal inspection No skin breakdown No calluses  Normal DP pulses Normal sensation to light touch and monofilament Nails normal

## 2024-01-22 NOTE — Assessment & Plan Note (Signed)
 Continue Xarelto .  History of DVT.

## 2024-01-22 NOTE — Assessment & Plan Note (Signed)
 Barnie Sakai, sister, as health care POA.

## 2024-01-22 NOTE — Assessment & Plan Note (Signed)
 Discussed options.  I need extra information to help him adjust his sugars.  I asked him to please check his sugar in pairs- before and then 2 hours after a meal.    He can fill out the grid and send me that.  We'll go from there.    If low sugars then let me know.   Continue Jardiance  and 70/30 insulin  for now, taking 35 units twice a day but we may need to adjust that.  I will await update from patient.

## 2024-01-30 ENCOUNTER — Other Ambulatory Visit: Payer: Self-pay | Admitting: Family Medicine

## 2024-02-01 NOTE — Telephone Encounter (Signed)
Not given by you in the past ok to refill?

## 2024-02-19 ENCOUNTER — Ambulatory Visit: Attending: Medical | Admitting: Medical

## 2024-02-19 ENCOUNTER — Encounter: Payer: Self-pay | Admitting: Medical

## 2024-02-19 VITALS — BP 109/70 | HR 70 | Ht 69.5 in | Wt 264.4 lb

## 2024-02-19 DIAGNOSIS — Z79899 Other long term (current) drug therapy: Secondary | ICD-10-CM

## 2024-02-19 DIAGNOSIS — I255 Ischemic cardiomyopathy: Secondary | ICD-10-CM | POA: Diagnosis not present

## 2024-02-19 DIAGNOSIS — I10 Essential (primary) hypertension: Secondary | ICD-10-CM | POA: Diagnosis not present

## 2024-02-19 DIAGNOSIS — I779 Disorder of arteries and arterioles, unspecified: Secondary | ICD-10-CM

## 2024-02-19 DIAGNOSIS — E1142 Type 2 diabetes mellitus with diabetic polyneuropathy: Secondary | ICD-10-CM | POA: Diagnosis not present

## 2024-02-19 DIAGNOSIS — I5022 Chronic systolic (congestive) heart failure: Secondary | ICD-10-CM

## 2024-02-19 DIAGNOSIS — I739 Peripheral vascular disease, unspecified: Secondary | ICD-10-CM

## 2024-02-19 DIAGNOSIS — E782 Mixed hyperlipidemia: Secondary | ICD-10-CM | POA: Diagnosis not present

## 2024-02-19 NOTE — Progress Notes (Unsigned)
 " Cardiology Office Note   Date:  02/20/2024  ID:  Benjamin Davidson, DOB 1950/09/20, MRN [id number] PCP: Cleatus Arlyss RAMAN, MD  [facility] Providers Cardiologist:  Evalene Lunger, MD   History of Present Illness Benjamin Davidson is a 74 y.o. male with a h/o obseity, CAD s/p CABG in 2008, diabetes h/o DVT in the left leg on chronic anticoagulation with Xarelto , HFrEF, chronic systolic heart failure, bilateral carotid disease who presents for 6 month follow-up.   CAD history started in 2019 where he underwent successful coronary artery bypass grafting.  He has also undergone peripheral vascular balloon angioplasty on the left posterior tibial and peroneal artery in 03/20/2017.  He had also been hospitalized in February 2019 and ended up with left great toe osteomyelitis with ulceration with related to the amputation of his left great toe.  He also had gangrene of the fifth toe on his right foot which required amputation of the right fourth and fifth toe as well back in 2013.  He was last seen in the office on 06/21/2021 by Dr. Gollan with complaints of shortness of breath.  Echocardiogram on 07/19/2021 which revealed EF 35 to 40%, left ventricle is moderately decreased function, left ventricle demonstrates regional wall motion abnormalities with hypokinesis of the anterior anteroseptal and apical region.  Grade 1 diastolic dysfunction.  He also underwent Lexiscan  stress testing which revealed fixed LV perfusion defects in the mid to apical anterior and anteroseptal walls with some peri-infarct ischemia, findings consistent with prior myocardial infarction and mild peri-infarct ischemia, moderate to severely reduced LV systolic function with an LVEF equal to 32%, coronary artery calcifications involving the LAD/RCA and left circumflex.   Echo 06/2021 for SOB showed reduced in EF 35-40%, HK of anteroseptal and anterior apical region, moderately dilated LV, mild LVH, G1DD, moderately dilated LA, trivial  MR, AV sclerosis.  Patient declined cardiac cath.  Lexiscan  showed EF<35%, fixed perfusion defect with peri-infarct ischemia, coronary artery calcifications LAD, RCA and LCx.  Repeat limited echo 01/2022 showed LVEF of 40 to 45%, mild LVH.  Patient was last seen 07/24/2023 denying claudication symptoms.  He was otherwise stable from a cardiac perspective.  Today, the patient reports he is stable from a caridac perspecitve. He reports chronic fatigue. He denies chest pain. He has stable DOE when he walks. No lower leg edema, dizziness, lightheadedness, palpitations. He denies claudication symptoms. No orthopnea or pnd.   Studies Reviewed EKG Interpretation Date/Time:  Tuesday February 19 2024 15:03:34 EST Ventricular Rate:  70 PR Interval:  174 QRS Duration:  138 QT Interval:  442 QTC Calculation: 477 R Axis:   23  Text Interpretation: Normal sinus rhythm with sinus arrhythmia Left bundle branch block When compared with ECG of 24-Jul-2023 08:48, No significant change was found Confirmed by Franchester, Josemiguel Gries ([id number]) on 02/19/2024 3:10:20 PM    Limited echo 01/2022  1. Left ventricular ejection fraction, by estimation, is 40 to 45%. The  left ventricle has mild to moderately decreased function. The left  ventricle demonstrates regional wall motion abnormalities (see scoring  diagram/findings for description). There is   mild left ventricular hypertrophy. Left ventricular diastolic parameters  are consistent with Grade I diastolic dysfunction (impaired relaxation).  There is hypokinesis of the left ventricular, entire anteroseptal wall.   2. Right ventricular systolic function is normal.   3. The aortic valve was not well visualized. Aortic valve regurgitation  is not visualized.    Echo 07/19/21  1. Left ventricular ejection fraction,  by estimation, is 35 to 40%. The  left ventricle has moderately decreased function. The left ventricle  demonstrates regional wall motion abnormalities  (hypokinesis of the  anterior/anteroseptal and apical region)  Moderately dilated LV. There is mild left ventricular hypertrophy. Left  ventricular diastolic parameters are consistent with Grade I diastolic  dysfunction (impaired relaxation).   2. Right ventricular systolic function is normal. The right ventricular  size is normal.   3. Left atrial size was moderately dilated.   4. The mitral valve is normal in structure. Trivial mitral valve  regurgitation. No evidence of mitral stenosis.   5. The aortic valve was not well visualized. Aortic valve regurgitation  is not visualized. Aortic valve sclerosis is present, with no evidence of  aortic valve stenosis.   6. The inferior vena cava is normal in size with greater than 50%  respiratory variability, suggesting right atrial pressure of 3 mmHg.   Comparison(s): EF 55%.    Myoview  lexiscan  07/28/21   Study is high risk due to EF <35%.   There is fixed LV perfusion defect in the mid to apical anterior and anteroseptal walls, with some peri-infarct ischemia   Findings are consistent with prior myocardial infarction with mild peri-infarct ischemia.   There is moderate to severely reduced LV systolic function, LVEF = 32%   There is coronary artery calcifications involving the LAD, RCA Lcx     Physical Exam VS:  BP 109/70 (BP Location: Left Arm, Patient Position: Sitting, Cuff Size: Large)   Pulse 70   Ht 5' 9.5 (1.765 m)   Wt 264 lb 6.4 oz (119.9 kg)   SpO2 98%   BMI 38.49 kg/m        Wt Readings from Last 3 Encounters:  02/19/24 264 lb 6.4 oz (119.9 kg)  01/17/24 256 lb (116.1 kg)  07/24/23 246 lb 8 oz (111.8 kg)    GEN: Well nourished, well developed in no acute distress NECK: No JVD; No carotid bruits CARDIAC: RRR, no murmurs, rubs, gallops RESPIRATORY:  Clear to auscultation without rales, wheezing or rhonchi  ABDOMEN: Soft, non-tender, non-distended EXTREMITIES:  No edema; No deformity   ASSESSMENT AND  PLAN  HFmrEF Most recent echo showed LVEF 45-50%. He previously declined cardiac cath. He had a stress test that was low risk with a fixed defect. He is euvolemic on exam. He reports chronic and unchanged DOE. Continue Coreg , Jardiance , Entresto , and lasix . I will update BMET and CBC today.  PAD He denies claudication symptoms, but is sedentary at baseline. He has not seen vascular in many years. Continue ASA and Crestor .  HTN BP is normal today. Continue current medications.   DM2 Most recent A1C 5.9.   HLD LDL 77, HSL 37, TG 84, TC 867. Continue Crestor  20mg  daily.   Bilateral carotid artery disease Carotid US  showed 1-39% bilaterally. I will update a carotid US . Continue ASA and Crestor .         Dispo: follow-up in 6 months  Signed, Rico Massar VEAR Fishman, PA-C   "

## 2024-02-19 NOTE — Patient Instructions (Signed)
 Medication Instructions:  Your physician recommends that you continue on your current medications as directed. Please refer to the Current Medication list given to you today.    *If you need a refill on your cardiac medications before your next appointment, please call your pharmacy*  Lab Work: Your provider would like for you to have following labs drawn today CBC, BMP.     Testing/Procedures: Your physician has requested that you have a carotid duplex. This test is an ultrasound of the carotid arteries in your neck. It looks at blood flow through these arteries that supply the brain with blood.   Allow one hour for this exam.  There are no restrictions or special instructions.  This will take place at [address] [facility]) [address], [place] [address]  Please note: We ask at that you not bring children with you during ultrasound (echo/ vascular) testing. Due to room size and safety concerns, children are not allowed in the ultrasound rooms during exams. Our front office staff cannot provide observation of children in our lobby area while testing is being conducted. An adult accompanying a patient to their appointment will only be allowed in the ultrasound room at the discretion of the ultrasound technician under special circumstances. We apologize for any inconvenience.   Follow-Up: At [facility], you and your health needs are our priority.  As part of our continuing mission to provide you with exceptional heart care, our providers are all part of one team.  This team includes your primary Cardiologist (physician) and Advanced Practice Providers or APPs (Physician Assistants and Nurse Practitioners) who all work together to provide you with the care you need, when you need it.  Your next appointment:   6 month(s)  Provider:   Timothy Gollan, MD

## 2024-02-20 LAB — BASIC METABOLIC PANEL WITH GFR
BUN/Creatinine Ratio: 16 (ref 10–24)
BUN: 17 mg/dL (ref 8–27)
CO2: 22 mmol/L (ref 20–29)
Calcium: 9.6 mg/dL (ref 8.6–10.2)
Chloride: 101 mmol/L (ref 96–106)
Creatinine, Ser: 1.08 mg/dL (ref 0.76–1.27)
Glucose: 152 mg/dL — ABNORMAL HIGH (ref 70–99)
Potassium: 5 mmol/L (ref 3.5–5.2)
Sodium: 138 mmol/L (ref 134–144)
eGFR: 73 mL/min/1.73

## 2024-02-20 LAB — CBC
Hematocrit: 44.7 % (ref 37.5–51.0)
Hemoglobin: 14.2 g/dL (ref 13.0–17.7)
MCH: 28.4 pg (ref 26.6–33.0)
MCHC: 31.8 g/dL (ref 31.5–35.7)
MCV: 89 fL (ref 79–97)
Platelets: 233 x10E3/uL (ref 150–450)
RBC: 5 x10E6/uL (ref 4.14–5.80)
RDW: 13.7 % (ref 11.6–15.4)
WBC: 7.6 x10E3/uL (ref 3.4–10.8)

## 2024-02-21 ENCOUNTER — Ambulatory Visit: Payer: Self-pay | Admitting: Medical

## 2024-02-25 ENCOUNTER — Telehealth: Admitting: Family

## 2024-02-25 ENCOUNTER — Encounter: Payer: Self-pay | Admitting: Family

## 2024-02-25 ENCOUNTER — Telehealth: Payer: Self-pay | Admitting: Family Medicine

## 2024-02-25 NOTE — Telephone Encounter (Signed)
 Spoke with pt and tried to help him navigate doing the video visit. Shapale and myself sent him several links to join the visit. Pt could not figure out how to do this. I attempted several times on how to walk the pt through logging in through the link sent to him as well as walking him through steps on how to join through his MyChart. Pt stated, I am an dinosaur, I don't know how to do this stuff. Pt asked him I could send the link to his sister but she is not currently with the pt. Advised pt that he would need to be present in order for the visit to be completed. Pt was very upset and stated, This sucks. I advised him that if he felt safe driving that he could try to be seen at an urgent care; pt declined.

## 2024-02-25 NOTE — Telephone Encounter (Signed)
 Copied from CRM [id number]. Topic: Clinical - Medication Question >> Feb 25, 2024  9:27 AM Rea BROCKS wrote: Reason for CRM: Bad cold, can't sleep at night. Asking for medication to be called in. No sob or wheezing.   [facility] - [place], [place] - [address] [place] [address] Phone: [phone] Fax: [phone] Hours: Not open 24 hours  [phone] (M)

## 2024-02-25 NOTE — Telephone Encounter (Signed)
 Virtual visit scheduled with Ginger, NP now given sxs

## 2024-02-26 NOTE — Progress Notes (Signed)
 Erroneous encounter pt was not seen.

## 2024-06-20 ENCOUNTER — Ambulatory Visit

## 2024-07-10 ENCOUNTER — Other Ambulatory Visit

## 2024-07-17 ENCOUNTER — Encounter: Admitting: Family Medicine
